# Patient Record
Sex: Female | Born: 1935
Health system: Southern US, Community
[De-identification: ages and names within clinical notes are randomized; demographics above are authoritative.]

## PROBLEM LIST (undated history)

## (undated) DIAGNOSIS — M353 Polymyalgia rheumatica: Secondary | ICD-10-CM

## (undated) DIAGNOSIS — M1990 Unspecified osteoarthritis, unspecified site: Secondary | ICD-10-CM

## (undated) DIAGNOSIS — I499 Cardiac arrhythmia, unspecified: Secondary | ICD-10-CM

## (undated) DIAGNOSIS — I10 Essential (primary) hypertension: Secondary | ICD-10-CM

## (undated) HISTORY — DX: Unspecified osteoarthritis, unspecified site: M19.90

## (undated) HISTORY — PX: SHOULDER ARTHROSCOPY W/ ROTATOR CUFF REPAIR: SHX2400

## (undated) HISTORY — DX: Cardiac arrhythmia, unspecified: I49.9

## (undated) HISTORY — PX: CATARACT EXTRACTION W/ INTRAOCULAR LENS  IMPLANT, BILATERAL: SHX1307

## (undated) HISTORY — DX: Polymyalgia rheumatica: M35.3

## (undated) HISTORY — DX: Essential (primary) hypertension: I10

---

## 1969-08-05 HISTORY — PX: VAGINAL HYSTERECTOMY: SUR661

## 1999-06-10 ENCOUNTER — Other Ambulatory Visit: Admission: RE | Admit: 1999-06-10 | Discharge: 1999-06-10 | Payer: Self-pay | Admitting: Family Medicine

## 2000-07-18 ENCOUNTER — Encounter: Admission: RE | Admit: 2000-07-18 | Discharge: 2000-07-18 | Payer: Self-pay | Admitting: Family Medicine

## 2000-07-18 ENCOUNTER — Encounter: Payer: Self-pay | Admitting: Family Medicine

## 2000-09-19 ENCOUNTER — Other Ambulatory Visit: Admission: RE | Admit: 2000-09-19 | Discharge: 2000-09-19 | Payer: Self-pay | Admitting: Family Medicine

## 2000-09-26 ENCOUNTER — Encounter: Payer: Self-pay | Admitting: Family Medicine

## 2000-09-26 ENCOUNTER — Encounter: Admission: RE | Admit: 2000-09-26 | Discharge: 2000-09-26 | Payer: Self-pay | Admitting: Family Medicine

## 2000-11-10 ENCOUNTER — Encounter: Payer: Self-pay | Admitting: Urology

## 2000-11-10 ENCOUNTER — Encounter: Admission: RE | Admit: 2000-11-10 | Discharge: 2000-11-10 | Payer: Self-pay | Admitting: Obstetrics and Gynecology

## 2001-08-01 ENCOUNTER — Encounter: Payer: Self-pay | Admitting: Family Medicine

## 2001-08-01 ENCOUNTER — Encounter: Admission: RE | Admit: 2001-08-01 | Discharge: 2001-08-01 | Payer: Self-pay | Admitting: Family Medicine

## 2001-10-18 ENCOUNTER — Other Ambulatory Visit: Admission: RE | Admit: 2001-10-18 | Discharge: 2001-10-18 | Payer: Self-pay | Admitting: Family Medicine

## 2002-08-07 ENCOUNTER — Encounter: Admission: RE | Admit: 2002-08-07 | Discharge: 2002-08-07 | Payer: Self-pay | Admitting: Family Medicine

## 2002-08-07 ENCOUNTER — Encounter: Payer: Self-pay | Admitting: Family Medicine

## 2002-10-10 ENCOUNTER — Encounter: Payer: Self-pay | Admitting: Family Medicine

## 2002-10-10 ENCOUNTER — Encounter: Admission: RE | Admit: 2002-10-10 | Discharge: 2002-10-10 | Payer: Self-pay | Admitting: Family Medicine

## 2003-09-10 ENCOUNTER — Encounter: Payer: Self-pay | Admitting: Family Medicine

## 2003-09-10 ENCOUNTER — Encounter: Admission: RE | Admit: 2003-09-10 | Discharge: 2003-09-10 | Payer: Self-pay | Admitting: Family Medicine

## 2004-08-03 ENCOUNTER — Ambulatory Visit (HOSPITAL_COMMUNITY): Admission: RE | Admit: 2004-08-03 | Discharge: 2004-08-03 | Payer: Self-pay | Admitting: Orthopedic Surgery

## 2004-08-03 ENCOUNTER — Ambulatory Visit (HOSPITAL_BASED_OUTPATIENT_CLINIC_OR_DEPARTMENT_OTHER): Admission: RE | Admit: 2004-08-03 | Discharge: 2004-08-03 | Payer: Self-pay | Admitting: Orthopedic Surgery

## 2004-10-19 ENCOUNTER — Encounter: Admission: RE | Admit: 2004-10-19 | Discharge: 2004-10-19 | Payer: Self-pay | Admitting: Family Medicine

## 2005-02-15 ENCOUNTER — Ambulatory Visit: Payer: Self-pay | Admitting: Family Medicine

## 2005-10-25 ENCOUNTER — Ambulatory Visit: Payer: Self-pay | Admitting: Family Medicine

## 2006-01-12 ENCOUNTER — Encounter: Admission: RE | Admit: 2006-01-12 | Discharge: 2006-01-12 | Payer: Self-pay | Admitting: Family Medicine

## 2006-02-22 ENCOUNTER — Ambulatory Visit: Payer: Self-pay | Admitting: Family Medicine

## 2006-03-17 ENCOUNTER — Ambulatory Visit: Payer: Self-pay | Admitting: Family Medicine

## 2006-03-29 ENCOUNTER — Ambulatory Visit: Payer: Self-pay | Admitting: Family Medicine

## 2006-04-06 ENCOUNTER — Encounter: Admission: RE | Admit: 2006-04-06 | Discharge: 2006-04-06 | Payer: Self-pay | Admitting: Family Medicine

## 2006-04-26 ENCOUNTER — Ambulatory Visit: Payer: Self-pay | Admitting: Internal Medicine

## 2006-05-10 ENCOUNTER — Encounter: Payer: Self-pay | Admitting: Internal Medicine

## 2006-05-10 ENCOUNTER — Ambulatory Visit: Payer: Self-pay | Admitting: Internal Medicine

## 2007-02-06 ENCOUNTER — Encounter: Admission: RE | Admit: 2007-02-06 | Discharge: 2007-02-06 | Payer: Self-pay | Admitting: Family Medicine

## 2007-04-24 ENCOUNTER — Ambulatory Visit: Payer: Self-pay | Admitting: Family Medicine

## 2007-04-24 LAB — CONVERTED CEMR LAB
ALT: 18 units/L (ref 0–40)
AST: 25 units/L (ref 0–37)
Albumin: 4.1 g/dL (ref 3.5–5.2)
Alkaline Phosphatase: 46 units/L (ref 39–117)
Basophils Relative: 0.1 % (ref 0.0–1.0)
Bilirubin, Direct: 0.1 mg/dL (ref 0.0–0.3)
CO2: 34 meq/L — ABNORMAL HIGH (ref 19–32)
Chloride: 104 meq/L (ref 96–112)
Creatinine, Ser: 0.8 mg/dL (ref 0.4–1.2)
Eosinophils Relative: 2.3 % (ref 0.0–5.0)
HCT: 43.1 % (ref 36.0–46.0)
Hemoglobin: 15 g/dL (ref 12.0–15.0)
LDL Cholesterol: 123 mg/dL — ABNORMAL HIGH (ref 0–99)
MCV: 98.9 fL (ref 78.0–100.0)
Monocytes Absolute: 0.7 10*3/uL (ref 0.2–0.7)
Monocytes Relative: 11.3 % — ABNORMAL HIGH (ref 3.0–11.0)
Neutrophils Relative %: 63.1 % (ref 43.0–77.0)
Platelets: 196 10*3/uL (ref 150–400)
Potassium: 3.6 meq/L (ref 3.5–5.1)
RBC: 4.36 M/uL (ref 3.87–5.11)
TSH: 0.81 microintl units/mL (ref 0.35–5.50)
VLDL: 20 mg/dL (ref 0–40)
WBC: 6.1 10*3/uL (ref 4.5–10.5)

## 2007-04-26 ENCOUNTER — Ambulatory Visit: Payer: Self-pay | Admitting: Family Medicine

## 2007-10-10 ENCOUNTER — Ambulatory Visit: Payer: Self-pay | Admitting: Family Medicine

## 2008-02-21 ENCOUNTER — Encounter: Admission: RE | Admit: 2008-02-21 | Discharge: 2008-02-21 | Payer: Self-pay | Admitting: Family Medicine

## 2008-03-04 ENCOUNTER — Encounter: Admission: RE | Admit: 2008-03-04 | Discharge: 2008-03-04 | Payer: Self-pay | Admitting: Family Medicine

## 2008-03-12 ENCOUNTER — Ambulatory Visit: Payer: Self-pay | Admitting: Family Medicine

## 2008-03-12 DIAGNOSIS — M1990 Unspecified osteoarthritis, unspecified site: Secondary | ICD-10-CM

## 2008-03-12 DIAGNOSIS — IMO0001 Reserved for inherently not codable concepts without codable children: Secondary | ICD-10-CM

## 2008-03-12 DIAGNOSIS — I10 Essential (primary) hypertension: Secondary | ICD-10-CM | POA: Insufficient documentation

## 2008-03-12 DIAGNOSIS — M810 Age-related osteoporosis without current pathological fracture: Secondary | ICD-10-CM | POA: Insufficient documentation

## 2008-04-09 ENCOUNTER — Ambulatory Visit: Payer: Self-pay | Admitting: Family Medicine

## 2008-04-09 LAB — CONVERTED CEMR LAB
ALT: 17 units/L (ref 0–35)
AST: 22 units/L (ref 0–37)
Alkaline Phosphatase: 64 units/L (ref 39–117)
BUN: 16 mg/dL (ref 6–23)
Basophils Relative: 0.6 % (ref 0.0–1.0)
Bilirubin, Direct: 0.1 mg/dL (ref 0.0–0.3)
CO2: 33 meq/L — ABNORMAL HIGH (ref 19–32)
Calcium: 9.4 mg/dL (ref 8.4–10.5)
Creatinine, Ser: 0.8 mg/dL (ref 0.4–1.2)
Eosinophils Relative: 3 % (ref 0.0–5.0)
GFR calc Af Amer: 91 mL/min
GFR calc non Af Amer: 75 mL/min
Lymphocytes Relative: 18.6 % (ref 12.0–46.0)
Neutrophils Relative %: 61.1 % (ref 43.0–77.0)
Platelets: 237 10*3/uL (ref 150–400)
Potassium: 5 meq/L (ref 3.5–5.1)
RDW: 12.9 % (ref 11.5–14.6)
TSH: 0.71 microintl units/mL (ref 0.35–5.50)
Total Protein: 6.8 g/dL (ref 6.0–8.3)

## 2008-04-16 ENCOUNTER — Telehealth: Payer: Self-pay | Admitting: Family Medicine

## 2008-04-16 ENCOUNTER — Ambulatory Visit: Payer: Self-pay | Admitting: Family Medicine

## 2008-04-22 ENCOUNTER — Telehealth: Payer: Self-pay | Admitting: Family Medicine

## 2008-05-16 ENCOUNTER — Ambulatory Visit: Payer: Self-pay | Admitting: Family Medicine

## 2008-05-16 DIAGNOSIS — M353 Polymyalgia rheumatica: Secondary | ICD-10-CM

## 2008-05-16 DIAGNOSIS — F17200 Nicotine dependence, unspecified, uncomplicated: Secondary | ICD-10-CM

## 2008-05-16 LAB — CONVERTED CEMR LAB
AST: 21 units/L (ref 0–37)
Albumin: 3.8 g/dL (ref 3.5–5.2)
Alkaline Phosphatase: 53 units/L (ref 39–117)
Anti Nuclear Antibody(ANA): NEGATIVE
BUN: 15 mg/dL (ref 6–23)
Basophils Relative: 0.5 % (ref 0.0–1.0)
Bilirubin, Direct: 0.1 mg/dL (ref 0.0–0.3)
Calcium: 9.2 mg/dL (ref 8.4–10.5)
Chloride: 96 meq/L (ref 96–112)
Cholesterol: 167 mg/dL (ref 0–200)
Creatinine, Ser: 0.8 mg/dL (ref 0.4–1.2)
GFR calc Af Amer: 91 mL/min
HDL: 39.3 mg/dL (ref >39.0)
Hemoglobin: 13.8 g/dL (ref 12.0–15.0)
Lymphocytes Relative: 16.9 % (ref 12.0–46.0)
Neutro Abs: 7.1 10*3/uL (ref 1.4–7.7)
Neutrophils Relative %: 71.9 % (ref 43.0–77.0)
Platelets: 278 10*3/uL (ref 150–400)
RBC: 4.11 M/uL (ref 3.87–5.11)
RDW: 13.6 % (ref 11.5–14.6)
Rhuematoid fact SerPl-aCnc: <20 intl units/mL — ABNORMAL LOW (ref 0.0–20.0)
Triglycerides: 90 mg/dL (ref 0–149)

## 2008-06-04 ENCOUNTER — Ambulatory Visit: Payer: Self-pay | Admitting: Family Medicine

## 2008-06-04 LAB — CONVERTED CEMR LAB
BUN: 20 mg/dL (ref 6–23)
Calcium: 9.5 mg/dL (ref 8.4–10.5)
Creatinine, Ser: 0.9 mg/dL (ref 0.4–1.2)
Potassium: 4 meq/L (ref 3.5–5.1)
Sodium: 137 meq/L (ref 135–145)

## 2008-06-05 ENCOUNTER — Telehealth: Payer: Self-pay | Admitting: Family Medicine

## 2008-06-09 ENCOUNTER — Ambulatory Visit: Payer: Self-pay | Admitting: Family Medicine

## 2008-06-09 DIAGNOSIS — E739 Lactose intolerance, unspecified: Secondary | ICD-10-CM

## 2008-06-19 ENCOUNTER — Telehealth: Payer: Self-pay | Admitting: Family Medicine

## 2008-07-14 ENCOUNTER — Telehealth: Payer: Self-pay | Admitting: Family Medicine

## 2008-09-09 ENCOUNTER — Ambulatory Visit: Payer: Self-pay | Admitting: Family Medicine

## 2009-03-16 ENCOUNTER — Encounter: Admission: RE | Admit: 2009-03-16 | Discharge: 2009-03-16 | Payer: Self-pay | Admitting: Family Medicine

## 2009-04-09 ENCOUNTER — Encounter: Payer: Self-pay | Admitting: Family Medicine

## 2009-04-21 ENCOUNTER — Ambulatory Visit: Payer: Self-pay | Admitting: Family Medicine

## 2009-04-21 LAB — CONVERTED CEMR LAB
Bilirubin Urine: NEGATIVE
Urobilinogen, UA: 0.2

## 2009-04-22 LAB — CONVERTED CEMR LAB
AST: 23 units/L (ref 0–37)
Basophils Relative: 0.1 % (ref 0.0–3.0)
CRP, High Sensitivity: 2 (ref 0.00–5.00)
Chloride: 103 meq/L (ref 96–112)
Cholesterol: 192 mg/dL (ref 0–200)
Creatinine, Ser: 0.7 mg/dL (ref 0.4–1.2)
Eosinophils Relative: 3.7 % (ref 0.0–5.0)
HDL: 47.8 mg/dL (ref >39.00)
Hemoglobin: 15.3 g/dL — ABNORMAL HIGH (ref 12.0–15.0)
Lymphocytes Relative: 16 % (ref 12.0–46.0)
Platelets: 190 10*3/uL (ref 150.0–400.0)
Potassium: 3.4 meq/L — ABNORMAL LOW (ref 3.5–5.1)
RBC: 4.3 M/uL (ref 3.87–5.11)
Sodium: 141 meq/L (ref 135–145)
TSH: 1.03 microintl units/mL (ref 0.35–5.50)
Total Bilirubin: 1 mg/dL (ref 0.3–1.2)
Total Protein: 7.1 g/dL (ref 6.0–8.3)
Triglycerides: 154 mg/dL — ABNORMAL HIGH (ref 0.0–149.0)
VLDL: 30.8 mg/dL (ref 0.0–40.0)

## 2009-07-23 ENCOUNTER — Ambulatory Visit: Payer: Self-pay | Admitting: Family Medicine

## 2009-09-23 ENCOUNTER — Ambulatory Visit: Payer: Self-pay | Admitting: Family Medicine

## 2009-11-02 ENCOUNTER — Telehealth: Payer: Self-pay | Admitting: Family Medicine

## 2009-11-03 ENCOUNTER — Ambulatory Visit: Payer: Self-pay | Admitting: Family Medicine

## 2009-11-03 DIAGNOSIS — J45901 Unspecified asthma with (acute) exacerbation: Secondary | ICD-10-CM

## 2010-03-30 ENCOUNTER — Encounter: Admission: RE | Admit: 2010-03-30 | Discharge: 2010-03-30 | Payer: Self-pay | Admitting: Family Medicine

## 2010-03-30 LAB — HM MAMMOGRAPHY

## 2010-05-24 ENCOUNTER — Ambulatory Visit: Payer: Self-pay | Admitting: Family Medicine

## 2010-05-24 LAB — CONVERTED CEMR LAB
AST: 24 units/L (ref 0–37)
BUN: 22 mg/dL (ref 6–23)
Calcium: 10 mg/dL (ref 8.4–10.5)
Chloride: 102 meq/L (ref 96–112)
Eosinophils Absolute: 0.3 10*3/uL (ref 0.0–0.7)
Glucose, Urine, Semiquant: NEGATIVE
HCT: 46.3 % — ABNORMAL HIGH (ref 36.0–46.0)
Hemoglobin: 15.9 g/dL — ABNORMAL HIGH (ref 12.0–15.0)
MCV: 104 fL — ABNORMAL HIGH (ref 78.0–100.0)
Monocytes Relative: 11.4 % (ref 3.0–12.0)
Neutro Abs: 4.5 10*3/uL (ref 1.4–7.7)
Nitrite: NEGATIVE
Platelets: 190 10*3/uL (ref 150.0–400.0)
Potassium: 4.9 meq/L (ref 3.5–5.1)
Protein, U semiquant: 1
RBC: 4.45 M/uL (ref 3.87–5.11)
RDW: 13.3 % (ref 11.5–14.6)
Sodium: 143 meq/L (ref 135–145)
Specific Gravity, Urine: 1.02
TSH: 1.18 microintl units/mL (ref 0.35–5.50)
Total Bilirubin: 1 mg/dL (ref 0.3–1.2)
Urobilinogen, UA: 1

## 2010-06-11 ENCOUNTER — Ambulatory Visit: Payer: Self-pay | Admitting: Family Medicine

## 2010-09-07 ENCOUNTER — Ambulatory Visit: Payer: Self-pay | Admitting: Family Medicine

## 2010-12-26 ENCOUNTER — Encounter: Payer: Self-pay | Admitting: Family Medicine

## 2011-01-04 NOTE — Assessment & Plan Note (Signed)
Summary: cpx//ccm   Vital Signs:  Patient profile:   77 year old female Menstrual status:  hysterectomy Height:      64.5 inches Weight:      121 pounds BMI:     20.52 Temp:     98.5 degrees F oral BP sitting:   140 / 90  (right arm) Cuff size:   regular  Vitals Entered By: Kathrynn Speed CMA (June 11, 2010 9:48 AM) CC: CPXwith lab review/src   CC:  CPXwith lab review/src.  History of Present Illness: Shirley [name] is a 77 year old, married female, smoker........Marland Kitchen 10 cigarettes a day...Marland Kitchen. two.  Again declines to try a smoking cessation program.  He comes in today for evaluation of hypertension.  Her blood pressures treated with Tenoretic 50 -- 25 daily 2...........40 mEq  potassium tablets daily.BP 120/80.  >MWV  Preventive Screening-Counseling & Management  Alcohol-Tobacco     Smoking Cessation Counseling: yes  Current Medications (verified): 1)  Potassium Chloride Crys Cr 20 Meq Tbcr (Potassium Chloride Crys Cr) .... 2 Daily 2)  Tenoretic 50 50-25 Mg Tabs (Atenolol-Chlorthalidone) .Marland Kitchen.. 1 Tablet By Mouth Once A Day  Allergies (verified): No Known Drug Allergies   Complete Medication List: 1)  Potassium Chloride Crys Cr 20 Meq Tbcr (Potassium chloride crys cr) .... 2 daily 2)  Tenoretic 50 50-25 Mg Tabs (Atenolol-chlorthalidone) .Marland Kitchen.. 1 tablet by mouth once a day  Other Orders: EKG w/ Interpretation (93000) Tobacco use cessation intermediate 3-10 minutes (95621) First annual wellness visit with prevention plan  (H0865)  Patient Instructions: 1)  Please schedule a follow-up appointment in 1 year. 2)  Tobacco is very bad for your health and your loved ones! You Should stop smoking!. 3)  Stop Smoking Tips: Choose a Quit date. Cut down before the Quit date. decide what you will do as a substitute when you feel the urge to smoke(gum,toothpick,exercise). 4)  It is important that you exercise regularly at least 20 minutes 5 times a week. If you develop chest pain, have severe  difficulty breathing, or feel very tired , stop exercising immediately and seek medical attention. 5)  Schedule your mammogram. 6)  Schedule a colonoscopy/sigmoidoscopy to help detect colon cancer. 7)  Take calcium +Vitamin D daily. 8)  Take an Aspirin every day.

## 2011-01-04 NOTE — Assessment & Plan Note (Signed)
Summary: flu shot/njr   Nurse Visit   Allergies: No Known Drug Allergies  Orders Added: 1)  Flu Vaccine 62yrs + MEDICARE PATIENTS [Q2039] 2)  Administration Flu vaccine - MCR [G0008] Flu Vaccine Consent Questions     Do you have a history of severe allergic reactions to this vaccine? no    Any prior history of allergic reactions to egg and/or gelatin? no    Do you have a sensitivity to the preservative Thimersol? no    Do you have a past history of Guillan-Barre Syndrome? no    Do you currently have an acute febrile illness? no    Have you ever had a severe reaction to latex? no    Vaccine information given and explained to patient? yes    Are you currently pregnant? no    Lot Number:[id number]   Exp Date:06/04/2011   Site Given  Left Deltoid IM.lbmedflu

## 2011-03-22 ENCOUNTER — Other Ambulatory Visit: Payer: Self-pay | Admitting: Family Medicine

## 2011-03-22 DIAGNOSIS — Z1231 Encounter for screening mammogram for malignant neoplasm of breast: Secondary | ICD-10-CM

## 2011-04-11 ENCOUNTER — Ambulatory Visit
Admission: RE | Admit: 2011-04-11 | Discharge: 2011-04-11 | Disposition: A | Discharge status: Home / Self Care | Payer: MEDICARE | Source: Ambulatory Visit | Attending: Family Medicine | Admitting: Family Medicine

## 2011-04-11 DIAGNOSIS — Z1231 Encounter for screening mammogram for malignant neoplasm of breast: Secondary | ICD-10-CM

## 2011-04-22 NOTE — Op Note (Signed)
NAME:  Shirley [name], Shirley [name]                         ACCOUNT NO.:  [id number]   MEDICAL RECORD NO.:  [id number]                   PATIENT TYPE:  AMB   LOCATION:  DSC                                  FACILITY:  [facility]   PHYSICIAN:  Katy Fitch. Naaman Plummer., M.D.          DATE OF BIRTH:  01/28/1934   DATE OF PROCEDURE:  08/03/2004  DATE OF DISCHARGE:                                 OPERATIVE REPORT   PREOPERATIVE DIAGNOSIS:  Chronic mucous cyst, right long finger, with  repetitive episodes of drainage.   POSTOPERATIVE DIAGNOSIS:  Chronic mucous cyst, right long finger, with  repetitive episodes of drainage.   OPERATION:  Excision of right long finger mucous cyst and DIP joint  debridement with marginal osteophyte resection from distant phalanx, dorsal  ulnar aspect, right long finger DIP joint, and irrigation of DIP joint.   OPERATION PERFORMED BY:  Katy Fitch. Sypher, M.D.   ASSISTANT:  Marveen Reeks. Dasnoit, P.A.-C.   ANESTHESIA:  0.25% Marcaine and 2% lidocaine metacarpal head level block  right long finger supplemented by IV sedation, supervised by  anesthesiologist, Dr. Krista Blue.   INDICATIONS:  Shirley [name] is a 77 year old woman referred for evaluation  and management of a painful draining right long finger mucous cyst. She had  a history of multiple episodes of spontaneous drainage and may have had an  episode of prior septic arthritis.   We recommended irrigation and debridement of her DIP joint and cyst  excision. After informed consent, she was brought to the operating room at  this time.   PROCEDURE:  [name] is brought to the operating room and placed in  supine position on the operating room table. Following light sedation, the  right arm was prepped with Betadine solution and sterilely draped.   Following placement 0.25% Marcaine and 2% lidocaine metacarpal head level  block, anesthesia was satisfactory in the right long finger.   Finger was exsanguinated with a gauze  wrap and a 1/2-inch Penrose drain was  placed at the P1 segment as a digital tourniquet.   Procedure commenced with a transverse incision directly over the DIP joint.  Subcutaneous tissues were carefully divided revealing a capsule of the DIP  joint that was exposed between the central extensor slip and the radial and  ulnar collateral ligaments. Triangular portions of the capsule were excised  to expose the insertion and deep surface of the extensor tendon at the  distal phalanx.   A small osteophyte was noted on the dorsal ulnar aspect of the joint. This  was removed with a micro curet. The joint was thoroughly irrigated.   The mucous cyst was then circumferentially dissected, drained of its  mucinous contents, and cleaned with a micro curet and fine rongeur.   No other loose bodies or other predicaments were identified.   The wound was then repaired with interrupted sutures of 5-0 nylon.   Compressive dressing was applied with Xeroform,  sterile gauze, and Coban.   For aftercare, Shirley [name] was advised to elevate her hand for one week. She  given prescriptions for Keflex 500 mg 1 p.o. q.8h. for 4 days as a  prophylactic antibiotic to prevent septic arthritis and Vicodin 1 tablet  p.o. q.4-6h. p.r.n. pain, 20 tablets without refill as an analgesic.                                               Katy Fitch Naaman Plummer., M.D.    RVS/MEDQ  D:  08/03/2004  T:  08/03/2004  Job:  [id number]

## 2011-04-29 ENCOUNTER — Other Ambulatory Visit: Payer: Self-pay | Admitting: Family Medicine

## 2011-05-24 ENCOUNTER — Encounter: Payer: Self-pay | Admitting: Internal Medicine

## 2011-06-05 HISTORY — PX: TOTAL SHOULDER REPLACEMENT: SUR1217

## 2011-06-16 ENCOUNTER — Encounter (HOSPITAL_COMMUNITY)
Admission: RE | Admit: 2011-06-16 | Discharge: 2011-06-16 | Disposition: A | Discharge status: Home / Self Care | Payer: Medicare Other | Source: Ambulatory Visit | Attending: Orthopedic Surgery | Admitting: Orthopedic Surgery

## 2011-06-16 ENCOUNTER — Other Ambulatory Visit (HOSPITAL_COMMUNITY): Payer: Self-pay | Admitting: Orthopedic Surgery

## 2011-06-16 DIAGNOSIS — Z8739 Personal history of other diseases of the musculoskeletal system and connective tissue: Secondary | ICD-10-CM

## 2011-06-16 LAB — CBC
MCV: 97.2 fL (ref 78.0–100.0)
Platelets: 213 10*3/uL (ref 150–400)
RBC: 4.65 MIL/uL (ref 3.87–5.11)
RDW: 13.3 % (ref 11.5–15.5)
WBC: 7.7 10*3/uL (ref 4.0–10.5)

## 2011-06-16 LAB — SURGICAL PCR SCREEN: MRSA, PCR: NEGATIVE

## 2011-06-16 LAB — APTT: aPTT: 29 seconds (ref 24–37)

## 2011-06-16 LAB — URINALYSIS, ROUTINE W REFLEX MICROSCOPIC
Hgb urine dipstick: NEGATIVE
Leukocytes, UA: NEGATIVE
Nitrite: NEGATIVE
Protein, ur: NEGATIVE mg/dL
pH: 6.5 (ref 5.0–8.0)

## 2011-06-16 LAB — PROTIME-INR
INR: 0.97 (ref 0.00–1.49)
Prothrombin Time: 13.1 seconds (ref 11.6–15.2)

## 2011-06-16 LAB — COMPREHENSIVE METABOLIC PANEL
Albumin: 4.1 g/dL (ref 3.5–5.2)
CO2: 34 mEq/L — ABNORMAL HIGH (ref 19–32)
Chloride: 98 mEq/L (ref 96–112)
GFR calc Af Amer: >60 mL/min (ref >60)
Glucose, Bld: 89 mg/dL (ref 70–99)

## 2011-06-22 ENCOUNTER — Other Ambulatory Visit: Payer: Medicare Other

## 2011-06-23 ENCOUNTER — Inpatient Hospital Stay (HOSPITAL_COMMUNITY)
Admission: RE | Admit: 2011-06-23 | Discharge: 2011-06-24 | DRG: 484 | Disposition: A | Discharge status: Home / Self Care | Payer: Medicare Other | Source: Ambulatory Visit | Attending: Orthopedic Surgery | Admitting: Orthopedic Surgery

## 2011-06-23 DIAGNOSIS — I10 Essential (primary) hypertension: Secondary | ICD-10-CM | POA: Diagnosis present

## 2011-06-23 DIAGNOSIS — Z01818 Encounter for other preprocedural examination: Secondary | ICD-10-CM

## 2011-06-23 DIAGNOSIS — M75120 Complete rotator cuff tear or rupture of unspecified shoulder, not specified as traumatic: Secondary | ICD-10-CM | POA: Diagnosis present

## 2011-06-23 DIAGNOSIS — M12819 Other specific arthropathies, not elsewhere classified, unspecified shoulder: Principal | ICD-10-CM | POA: Diagnosis present

## 2011-06-23 DIAGNOSIS — M353 Polymyalgia rheumatica: Secondary | ICD-10-CM | POA: Diagnosis present

## 2011-06-29 ENCOUNTER — Encounter: Payer: Medicare Other | Admitting: Family Medicine

## 2011-07-14 NOTE — Op Note (Signed)
NAMEDESIRIE, MINTEER NO.:  [id number]  MEDICAL RECORD NO.:  [id number]  LOCATION:  5028                         FACILITY:  [facility]  PHYSICIAN:  Vania Rea. Kemoni Ortega, M.D.  DATE OF BIRTH:  June 02, 1934  DATE OF PROCEDURE:  06/23/2011 DATE OF DISCHARGE:                              OPERATIVE REPORT   PREOPERATIVE DIAGNOSIS:  End-stage right shoulder rotator cuff tear arthropathy.  POSTOPERATIVE DIAGNOSIS:  End-stage right shoulder rotator cuff tear arthropathy.  PROCEDURE:  Right reverse shoulder arthroplasty utilizing a cemented DePuy stem size 10, +3 poly, and a 38 eccentric glenosphere.  SURGEON:  Vania Rea. Nikolette Reindl, M.D.  Threasa HeadsFrench Ana A. Shuford, P.A.-C.  ANESTHESIA:  General endotracheal as well as an interscalene block.  ESTIMATED BLOOD LOSS:  250 mL.  DRAINS:  None.  HISTORY:  Shirley [name] is a 77 year old female who has had a previous right shoulder rotator cuff repair for a massive rotator cuff tear, has gone on to a recurrent tear and progression to end-stage rotator cuff tear arthropathy.  Due to her ongoing and increasing pain as well as functional limitation, she was brought to the operating room at this time for planned right shoulder reverse arthroplasty.  Preoperatively, counseled Shirley [name] on treatment options as well as risks versus benefits thereof.  Possible surgical complications were reviewed including potential for bleeding, infection, neurovascular injury, persistent pain, loss of motion, failure of the implant, anesthetic complication, and possible need for additional surgery.  She understands and accepts, and agrees with the planned procedure.  PROCEDURE IN DETAIL:  After undergoing routine preop evaluation, the patient received prophylactic antibiotics and an interscalene block was established in the holding area by the Anesthesia Department.  Placed supine on the operating room table, underwent smooth induction of general  endotracheal anesthesia.  Placed in beach-chair position and appropriately padded and protected.  The right shoulder region was then sterilely prepped and draped in standard fashion.  Time-out was called. Anterior approach of the right shoulder was made through deltopectoral interval incision of approximately 15 cm in length.  Skin flaps were elevated.  Electrocautery was used for hemostasis.  Deltopectoral interval was identified with cephalic vein retracted lateral to the deltoid and this interval was developed and the upper centimeter of pec major was tenotomized.  Adhesions throughout the subacromial/subdeltoid bursal region were divided and then the  remnant of the rotator cuff superiorly was excised.  The anterior cuff tissues were also divided and the humeral head was then delivered through the wound and the capsular tissues in the inferior aspect of the humeral head were divided to allow complete exposure of the humeral head.  It was markedly eburnated both medially and superiorly.  A rongeur was then used to resect the eburnated bone at the apex of the humeral head and through this we have passed a series of hand reamers up to size 10 into the humeral canal. The intramedullary guide was then used and had a 0 degree retroversion. We performed our initial humeral head cut utilizing the appropriate guide and oscillating saw.  Once this was completed, a metal cap was placed across the cut surface of the proximal humerus and we then  used retractors to expose the glenoid and performed a circumferential labral resection as well as capsulotomy such that the entire circumference of the glenoid could be visualized.  The glenoid guide was placed and a guide pin was then directed into the center of the glenoid.  We then reamed glenoid to a smooth subchondral bone surface and used the peripheral reamer to clean any remaining of bony debris.  The central peg hole was then drilled with  appropriate reamer.  This was then copiously irrigated and we impacted the glenoid baseplate and then used the drill to place the four peripheral screws.  Locking screws placed into the inferior, superior, and posterior holes, and a nonlocking screw anteriorly and all these obtained excellent bony purchase.  We then placed the 38 eccentric glenosphere over the guidewire into the baseplate, it was then screwed into position, tamped, and retightened with excellent fit and fixation.  At this point, we then returned our attention to the humerus where the trial stem was then impacted to appropriate depth and of this we sized the humerus of a size 1.  We then placed the metaphyseal reamer.  The humeral guide was then removed and the trial implant was placed.  The excellent fixation was achieved and a +3 poly showed excellent soft tissue balance and fit.  After the trial reduction, the implant was removed.  We irrigated the canal.  A distal cement plug was placed.  The canal was then irrigated, dried, and cement was then mixed in the appropriate consistency, we introduced cement into the humeral canal.  The final stem was then impacted into position at 0 degrees of retroversion and all extra cement was meticulously removed. After the cemented hardware, we again performed trial reduction with +3 which showed excellent soft tissue balance and fit.  The final +3 poly was then impacted into position.  Final reduction was performed. Excellent shoulder motion and stability was achieved.  The wound was copiously irrigated.  Hemostasis was obtained.  The deltopectoral interval was then reapproximated with 0 Vicryl sutures, 2-0 Vicryl used for the subcu layer and intracuticular, and 3-0 Monocryl for the skin followed by Steri-Strips.  Dry dressing was applied.  Right arm was placed in a sling.  The patient was then awakened, extubated, and taken to the recovery room in stable condition.     Vania Rea.  Ryker Sudbury, M.D.     KMS/MEDQ  D:  06/23/2011  T:  06/23/2011  Job:  [id number]  Electronically Signed by Francena Hanly M.D. on 07/14/2011 01:30:28 PM

## 2011-07-25 ENCOUNTER — Other Ambulatory Visit (INDEPENDENT_AMBULATORY_CARE_PROVIDER_SITE_OTHER): Payer: Medicare Other

## 2011-07-25 DIAGNOSIS — M353 Polymyalgia rheumatica: Secondary | ICD-10-CM

## 2011-07-25 DIAGNOSIS — IMO0001 Reserved for inherently not codable concepts without codable children: Secondary | ICD-10-CM

## 2011-07-25 DIAGNOSIS — Z79899 Other long term (current) drug therapy: Secondary | ICD-10-CM

## 2011-07-25 DIAGNOSIS — Z0000 Encounter for general adult medical examination without abnormal findings: Secondary | ICD-10-CM

## 2011-07-25 DIAGNOSIS — F17200 Nicotine dependence, unspecified, uncomplicated: Secondary | ICD-10-CM

## 2011-07-25 DIAGNOSIS — I10 Essential (primary) hypertension: Secondary | ICD-10-CM

## 2011-07-25 LAB — HEPATIC FUNCTION PANEL
ALT: 12 U/L (ref 0–35)
AST: 21 U/L (ref 0–37)
Albumin: 4.3 g/dL (ref 3.5–5.2)
Total Bilirubin: 0.6 mg/dL (ref 0.3–1.2)
Total Protein: 7.2 g/dL (ref 6.0–8.3)

## 2011-07-25 LAB — TSH: TSH: 0.97 u[IU]/mL (ref 0.35–5.50)

## 2011-07-25 LAB — BASIC METABOLIC PANEL
BUN: 20 mg/dL (ref 6–23)
Chloride: 100 mEq/L (ref 96–112)
GFR: 69.23 mL/min (ref >60.00)
Glucose, Bld: 101 mg/dL — ABNORMAL HIGH (ref 70–99)
Potassium: 4.1 mEq/L (ref 3.5–5.1)
Sodium: 141 mEq/L (ref 135–145)

## 2011-07-25 LAB — CBC WITH DIFFERENTIAL/PLATELET
Eosinophils Relative: 8 % — ABNORMAL HIGH (ref 0.0–5.0)
HCT: 44.8 % (ref 36.0–46.0)
Hemoglobin: 14.9 g/dL (ref 12.0–15.0)
Lymphs Abs: 1.6 10*3/uL (ref 0.7–4.0)
MCV: 102.2 fl — ABNORMAL HIGH (ref 78.0–100.0)
Monocytes Absolute: 0.7 10*3/uL (ref 0.1–1.0)
Monocytes Relative: 11.6 % (ref 3.0–12.0)
Neutro Abs: 3 10*3/uL (ref 1.4–7.7)
Platelets: 171 10*3/uL (ref 150.0–400.0)
WBC: 5.8 10*3/uL (ref 4.5–10.5)

## 2011-07-25 LAB — POCT URINALYSIS DIPSTICK
Bilirubin, UA: NEGATIVE
Glucose, UA: NEGATIVE
Ketones, UA: NEGATIVE
Spec Grav, UA: 1.025
Urobilinogen, UA: 0.2

## 2011-07-25 LAB — LIPID PANEL
Cholesterol: 173 mg/dL (ref 0–200)
Triglycerides: 104 mg/dL (ref 0.0–149.0)

## 2011-08-01 ENCOUNTER — Ambulatory Visit (INDEPENDENT_AMBULATORY_CARE_PROVIDER_SITE_OTHER): Payer: Medicare Other | Admitting: Family Medicine

## 2011-08-01 ENCOUNTER — Encounter: Payer: Self-pay | Admitting: Family Medicine

## 2011-08-01 DIAGNOSIS — F17200 Nicotine dependence, unspecified, uncomplicated: Secondary | ICD-10-CM

## 2011-08-01 DIAGNOSIS — I10 Essential (primary) hypertension: Secondary | ICD-10-CM

## 2011-08-01 DIAGNOSIS — IMO0001 Reserved for inherently not codable concepts without codable children: Secondary | ICD-10-CM

## 2011-08-01 DIAGNOSIS — M1990 Unspecified osteoarthritis, unspecified site: Secondary | ICD-10-CM

## 2011-08-01 DIAGNOSIS — M810 Age-related osteoporosis without current pathological fracture: Secondary | ICD-10-CM

## 2011-08-01 DIAGNOSIS — M353 Polymyalgia rheumatica: Secondary | ICD-10-CM

## 2011-08-01 MED ORDER — ATENOLOL-CHLORTHALIDONE 50-25 MG PO TABS: 0.5000 | ORAL_TABLET | Freq: Every day | ORAL | Status: DC

## 2011-08-01 MED ORDER — POTASSIUM CHLORIDE 20 MEQ PO PACK: PACK | ORAL | Status: DC

## 2011-08-01 NOTE — Progress Notes (Signed)
  Subjective:    Patient ID: Shirley [name], female    DOB: Sep 23, 1934, 77 y.o.   MRN: [id number]  Shirley [name] is a 77 year old, married female, smoker 5 cigarettes a day, now who comes in today for evaluation a Medicare wellness examination.  She has a history of hypertension, for which she takes Tenoretic 50 -- 25 daily.  BP 124/80.  Willcutt dose to half a tablet a day.  She also has a history of polymyalgia rheumatica and has been symptom free for many years and now is beginning to ache all over.  She also has lumbar back pain for about two months.  It's worse in the morning, when she gets up and moves around it goes away.  She does have a history of osteoporosis and continues to smoke.  She does not take calcium or vitamin D supplements.  She has routine eye care, hearing normal, regular dental care, BSE monthly, and your mammography, colonoscopy due this year.  She put it off because she had right shoulder surgery.  Cognitive function, normal.  Activities of daily living, normal, home health safety reviewed.  No issues identified, no guns in the house, she does have a healthcare power of attorney, and a living will.    Review of Systems  Constitutional: Negative.   HENT: Negative.   Eyes: Negative.   Respiratory: Negative.   Cardiovascular: Negative.   Gastrointestinal: Negative.   Genitourinary: Negative.   Musculoskeletal: Positive for myalgias, back pain and arthralgias.  Neurological: Negative.   Hematological: Negative.   Psychiatric/Behavioral: Negative.        Objective:   Physical Exam  Constitutional: She appears well-developed and well-nourished.  HENT:  Head: Normocephalic and atraumatic.  Right Ear: External ear normal.  Left Ear: External ear normal.  Nose: Nose normal.  Mouth/Throat: Oropharynx is clear and moist.  Eyes: EOM are normal. Pupils are equal, round, and reactive to light.  Neck: Normal range of motion. Neck supple. No thyromegaly present.    Cardiovascular: Normal rate, regular rhythm, normal heart sounds and intact distal pulses.  Exam reveals no gallop and no friction rub.   No murmur heard. Pulmonary/Chest: Effort normal and breath sounds normal.  Abdominal: Soft. Bowel sounds are normal. She exhibits no distension and no mass. There is no tenderness. There is no rebound.  Genitourinary:       Bilateral breast exam normal  Musculoskeletal: Normal range of motion.       Accentuated lumbar  Lymphadenopathy:    She has no cervical adenopathy.  Neurological: She is alert. She has normal reflexes. No cranial nerve deficit. She exhibits normal muscle tone. Coordination normal.  Skin: Skin is warm and dry.  Psychiatric: She has a normal mood and affect. Her behavior is normal. Judgment and thought content normal.          Assessment & Plan:  Healthy female.  Hypertension decrease Tenoretic to one half tab daily.  Osteoarthritis.  Motrin, 600 b.i.d., exercise program.  Osteoporosis.  Check bone density.  PMR.  Restart prednisone.

## 2011-08-01 NOTE — Patient Instructions (Signed)
Decrease the Tenoretic to one half tablet daily.  Restart the prednisone by taking 40 mg daily for 5 days, 30 mg daily for 5 days, 20 mg daily for 5 days, then 10 mg daily for 5 days.  Return in 3 weeks for follow-up.  Take a calcium, vitamin D supplement tablet daily.  Walk daily.  Stop smoking completely

## 2011-08-22 ENCOUNTER — Encounter: Payer: Self-pay | Admitting: Family Medicine

## 2011-08-22 ENCOUNTER — Ambulatory Visit (INDEPENDENT_AMBULATORY_CARE_PROVIDER_SITE_OTHER): Payer: Medicare Other | Admitting: Family Medicine

## 2011-08-22 DIAGNOSIS — F17200 Nicotine dependence, unspecified, uncomplicated: Secondary | ICD-10-CM

## 2011-08-22 DIAGNOSIS — I10 Essential (primary) hypertension: Secondary | ICD-10-CM

## 2011-08-22 DIAGNOSIS — Z23 Encounter for immunization: Secondary | ICD-10-CM

## 2011-08-22 DIAGNOSIS — M51369 Other intervertebral disc degeneration, lumbar region without mention of lumbar back pain or lower extremity pain: Secondary | ICD-10-CM | POA: Insufficient documentation

## 2011-08-22 DIAGNOSIS — M353 Polymyalgia rheumatica: Secondary | ICD-10-CM

## 2011-08-22 DIAGNOSIS — M5136 Other intervertebral disc degeneration, lumbar region: Secondary | ICD-10-CM | POA: Insufficient documentation

## 2011-08-22 DIAGNOSIS — M5137 Other intervertebral disc degeneration, lumbosacral region: Secondary | ICD-10-CM

## 2011-08-22 MED ORDER — PREDNISONE 1 MG PO TABS: ORAL_TABLET | ORAL | Status: DC

## 2011-08-22 NOTE — Progress Notes (Signed)
Addended by: Kern Reap B on: 08/22/2011 12:26 PM   Modules accepted: Orders

## 2011-08-22 NOTE — Patient Instructions (Signed)
Tapered the prednisone by 1 mg every month as outlined by a rheumatologist.  Strongly consider a smoking cessation program along with physical therapy for your low back pain.  Continue the current medications for your blood pressure.  Check your blood pressure 3 times weekly at home with a new machine.  returned when you r due f your exam sooner if any problems

## 2011-08-22 NOTE — Progress Notes (Signed)
  Subjective:    Patient ID: Shirley [name], female    DOB: 01-Jan-1934, 77 y.o.   MRN: [id number]  Shirley [name] is a 77 year old, married female, smoker.......... 5 cigarettes a day............ We discussed smoking cessation, and different techniques, including chantix.......... Patient declines.  Her blood pressure on Tenoretic 50 -- 25 one half tab daily is 130/90.  She also takes potassium supplement daily.  She had surgery on her right shoulder and is doing well.  She continues to smoke, but declines to try to quit.  She has a history of PMR, which was diagnosed by Dr. Dareen Piano.  She is currently on 10 mg of prednisone daily was advised to taper by 1 mg every month.  She has right and left low lumbar back pain.  It, sharp, it's worse in the morning.  It radiates down her hips.  Neurologic review of systems otherwise negative.  No history of trauma    Review of Systems General cardiovascular, musculoskeletal, neurologic, review of systems otherwise negative    Objective:   Physical Exam  A well-developed, thin, female, in no acute distress.  BP 130/90.  Examination of back shows the spine to be straight.  No scoliosis.  There is no palpable tenderness.  Neurologic exam normal      Assessment & Plan:  Hypertension continue current medications.  Tobacco abuse advised to stop smoking and outlined a smoking cessation program.  However, patient declines to try  PMR, taper prednisone as directed by rheumatologist.  Lumbar disk disease.  Advise stop smoking and physical therapy both of which she declined

## 2011-09-05 ENCOUNTER — Telehealth: Payer: Self-pay | Admitting: *Deleted

## 2011-09-05 DIAGNOSIS — M549 Dorsalgia, unspecified: Secondary | ICD-10-CM

## 2011-09-05 NOTE — Telephone Encounter (Signed)
Pt is asking for a referral to Dr. Naaman Plummer (orthopedist).  Feels she needs to see an Orthopedist and get a MRI before she starts PT.  This is regarding her back pain with radiation to legs.

## 2011-09-05 NOTE — Telephone Encounter (Signed)
She is welcome  to call and see anybody.  She would like to see his second opinion........she does not need a referral to see an orthopedist

## 2011-09-07 ENCOUNTER — Telehealth: Payer: Self-pay | Admitting: *Deleted

## 2011-09-07 DIAGNOSIS — M810 Age-related osteoporosis without current pathological fracture: Secondary | ICD-10-CM

## 2011-09-07 DIAGNOSIS — M5136 Other intervertebral disc degeneration, lumbar region: Secondary | ICD-10-CM

## 2011-09-07 NOTE — Telephone Encounter (Signed)
Pt is calling to speak to [place].  Asking for a return call.

## 2012-02-08 ENCOUNTER — Telehealth: Payer: Self-pay | Admitting: Family Medicine

## 2012-02-08 NOTE — Telephone Encounter (Signed)
Pts son called and said that his mother is showing signs of memory loss and pts son would like to discuss this with Dr Tawanna Cooler. Pls call.

## 2012-02-09 NOTE — Telephone Encounter (Signed)
Fleet Contras please call,,,,,,,,,, we will need to sit down with the patient and her family to discuss this

## 2012-02-10 NOTE — Telephone Encounter (Signed)
Spoke with son.

## 2012-03-01 ENCOUNTER — Other Ambulatory Visit: Payer: Self-pay | Admitting: Neurosurgery

## 2012-03-13 ENCOUNTER — Encounter (HOSPITAL_COMMUNITY): Payer: Self-pay | Admitting: Pharmacy Technician

## 2012-03-21 ENCOUNTER — Encounter (HOSPITAL_COMMUNITY)
Admission: RE | Admit: 2012-03-21 | Discharge: 2012-03-21 | Disposition: A | Discharge status: Home / Self Care | Payer: Medicare Other | Source: Ambulatory Visit | Attending: Neurosurgery | Admitting: Neurosurgery

## 2012-03-21 ENCOUNTER — Encounter (HOSPITAL_COMMUNITY): Payer: Self-pay

## 2012-03-21 LAB — CBC
HCT: 47.5 % — ABNORMAL HIGH (ref 36.0–46.0)
Hemoglobin: 16.1 g/dL — ABNORMAL HIGH (ref 12.0–15.0)
MCHC: 33.9 g/dL (ref 30.0–36.0)
RBC: 4.8 MIL/uL (ref 3.87–5.11)
WBC: 9.5 10*3/uL (ref 4.0–10.5)

## 2012-03-21 LAB — TYPE AND SCREEN
ABO/RH(D): O POS
Antibody Screen: NEGATIVE

## 2012-03-21 LAB — COMPREHENSIVE METABOLIC PANEL
AST: 24 U/L (ref 0–37)
BUN: 16 mg/dL (ref 6–23)
CO2: 30 mEq/L (ref 19–32)
Calcium: 9.5 mg/dL (ref 8.4–10.5)
Chloride: 100 mEq/L (ref 96–112)
Creatinine, Ser: 0.69 mg/dL (ref 0.50–1.10)
GFR calc Af Amer: >90 mL/min (ref >90)
GFR calc non Af Amer: 82 mL/min — ABNORMAL LOW (ref >90)
Glucose, Bld: 102 mg/dL — ABNORMAL HIGH (ref 70–99)
Total Bilirubin: 0.5 mg/dL (ref 0.3–1.2)

## 2012-03-21 LAB — SURGICAL PCR SCREEN: Staphylococcus aureus: NEGATIVE

## 2012-03-21 NOTE — Progress Notes (Signed)
PATIENT SEES DR JEFF TODD WITH [place].

## 2012-03-21 NOTE — Pre-Procedure Instructions (Signed)
20 Shirley [name]  03/21/2012   Your procedure is scheduled on: Wednesday  03/28/12    Report to [facility] at 815 AM.  Call this number if you have problems the morning of surgery: [phone]   Remember:   Do not eat food:After Midnight.  May have clear liquids: up to 4 Hours before arrival (UNTIL 415 AM )  Clear liquids include soda, tea, black coffee, apple or grape juice, broth.  Take these medicines the morning of surgery with A SIP OF WATER: TENORETIC(ATENOLOL-CHLOR), KLOR-CON  (STOP ASPIRIN, COUMADIN, PLAVIX, EFFIENT, HERBAL MEDS)   Do not wear jewelry, make-up or nail polish.  Do not wear lotions, powders, or perfumes. You may wear deodorant.  Do not shave 48 hours prior to surgery.  Do not bring valuables to the hospital.  Contacts, dentures or bridgework may not be worn into surgery.  Leave suitcase in the car. After surgery it may be brought to your room.  For patients admitted to the hospital, checkout time is 11:00 AM the day of discharge.   Patients discharged the day of surgery will not be allowed to drive home.  Name and phone number of your driver:  Special Instructions: CHG Shower Use Special Wash: 1/2 bottle night before surgery and 1/2 bottle morning of surgery.   Please read over the following fact sheets that you were given: Pain Booklet, MRSA Information and Surgical Site Infection Prevention

## 2012-03-27 MED ORDER — CEFAZOLIN SODIUM 1-5 GM-% IV SOLN
1.0000 g | INTRAVENOUS | Status: AC
  Administered 2012-03-28: 1000 mg via INTRAVENOUS
  Filled 2012-03-27: qty 50

## 2012-03-28 ENCOUNTER — Encounter (HOSPITAL_COMMUNITY)
Admission: RE | Disposition: A | Discharge status: Home / Self Care | Payer: Self-pay | Source: Ambulatory Visit | Attending: Neurosurgery

## 2012-03-28 ENCOUNTER — Inpatient Hospital Stay (HOSPITAL_COMMUNITY): Payer: Medicare Other

## 2012-03-28 ENCOUNTER — Encounter (HOSPITAL_COMMUNITY): Payer: Self-pay | Admitting: Anesthesiology

## 2012-03-28 ENCOUNTER — Encounter (HOSPITAL_COMMUNITY): Payer: Self-pay | Admitting: General Practice

## 2012-03-28 ENCOUNTER — Inpatient Hospital Stay (HOSPITAL_COMMUNITY): Payer: Medicare Other | Admitting: Anesthesiology

## 2012-03-28 ENCOUNTER — Inpatient Hospital Stay (HOSPITAL_COMMUNITY)
Admission: RE | Admit: 2012-03-28 | Discharge: 2012-03-31 | DRG: 460 | Disposition: A | Discharge status: Home / Self Care | Payer: Medicare Other | Source: Ambulatory Visit | Attending: Neurosurgery | Admitting: Neurosurgery

## 2012-03-28 DIAGNOSIS — I10 Essential (primary) hypertension: Secondary | ICD-10-CM

## 2012-03-28 DIAGNOSIS — Z01812 Encounter for preprocedural laboratory examination: Secondary | ICD-10-CM

## 2012-03-28 DIAGNOSIS — M5137 Other intervertebral disc degeneration, lumbosacral region: Secondary | ICD-10-CM | POA: Diagnosis present

## 2012-03-28 DIAGNOSIS — M4310 Spondylolisthesis, site unspecified: Principal | ICD-10-CM | POA: Diagnosis present

## 2012-03-28 DIAGNOSIS — M810 Age-related osteoporosis without current pathological fracture: Secondary | ICD-10-CM | POA: Diagnosis present

## 2012-03-28 DIAGNOSIS — Z01818 Encounter for other preprocedural examination: Secondary | ICD-10-CM

## 2012-03-28 DIAGNOSIS — M51379 Other intervertebral disc degeneration, lumbosacral region without mention of lumbar back pain or lower extremity pain: Secondary | ICD-10-CM | POA: Diagnosis present

## 2012-03-28 DIAGNOSIS — Z87891 Personal history of nicotine dependence: Secondary | ICD-10-CM

## 2012-03-28 HISTORY — PX: POSTERIOR FUSION LUMBAR SPINE: SUR632

## 2012-03-28 HISTORY — DX: Unspecified osteoarthritis, unspecified site: M19.90

## 2012-03-28 SURGERY — POSTERIOR LUMBAR FUSION 1 LEVEL
Anesthesia: General

## 2012-03-28 MED ORDER — FENTANYL CITRATE 0.05 MG/ML IJ SOLN
INTRAMUSCULAR | Status: DC | PRN
  Administered 2012-03-28 (×3): 50 ug via INTRAVENOUS
  Administered 2012-03-28: 150 ug via INTRAVENOUS

## 2012-03-28 MED ORDER — NEOSTIGMINE METHYLSULFATE 1 MG/ML IJ SOLN
INTRAMUSCULAR | Status: DC | PRN
  Administered 2012-03-28: 3 mg via INTRAVENOUS

## 2012-03-28 MED ORDER — NALOXONE HCL 0.4 MG/ML IJ SOLN: 0.4000 mg | INTRAMUSCULAR | Status: DC | PRN

## 2012-03-28 MED ORDER — THROMBIN 20000 UNITS EX KIT
PACK | CUTANEOUS | Status: DC | PRN
  Administered 2012-03-28 (×2): via TOPICAL

## 2012-03-28 MED ORDER — MIDAZOLAM HCL 5 MG/5ML IJ SOLN
INTRAMUSCULAR | Status: DC | PRN
  Administered 2012-03-28 (×2): 1 mg via INTRAVENOUS

## 2012-03-28 MED ORDER — MENTHOL 3 MG MT LOZG: 1.0000 | LOZENGE | OROMUCOSAL | Status: DC | PRN

## 2012-03-28 MED ORDER — ONDANSETRON HCL 4 MG/2ML IJ SOLN: 4.0000 mg | INTRAMUSCULAR | Status: DC | PRN

## 2012-03-28 MED ORDER — EPHEDRINE SULFATE 50 MG/ML IJ SOLN
INTRAMUSCULAR | Status: DC | PRN
  Administered 2012-03-28: 10 mg via INTRAVENOUS

## 2012-03-28 MED ORDER — ROCURONIUM BROMIDE 100 MG/10ML IV SOLN
INTRAVENOUS | Status: DC | PRN
  Administered 2012-03-28: 50 mg via INTRAVENOUS

## 2012-03-28 MED ORDER — ATENOLOL 25 MG PO TABS
25.0000 mg | ORAL_TABLET | Freq: Every day | ORAL | Status: DC
  Administered 2012-03-30: 25 mg via ORAL
  Filled 2012-03-28 (×3): qty 1

## 2012-03-28 MED ORDER — BUPIVACAINE-EPINEPHRINE PF 0.5-1:200000 % IJ SOLN
INTRAMUSCULAR | Status: DC | PRN
  Administered 2012-03-28: 10 mL

## 2012-03-28 MED ORDER — MORPHINE SULFATE 2 MG/ML IJ SOLN: 0.0500 mg/kg | INTRAMUSCULAR | Status: DC | PRN

## 2012-03-28 MED ORDER — ONDANSETRON HCL 4 MG/2ML IJ SOLN
INTRAMUSCULAR | Status: DC | PRN
  Administered 2012-03-28: 4 mg via INTRAVENOUS

## 2012-03-28 MED ORDER — ACETAMINOPHEN 650 MG RE SUPP: 650.0000 mg | RECTAL | Status: DC | PRN

## 2012-03-28 MED ORDER — LATANOPROST 0.005 % OP SOLN
1.0000 [drp] | Freq: Every day | OPHTHALMIC | Status: DC
  Administered 2012-03-28 – 2012-03-30 (×3): 1 [drp] via OPHTHALMIC
  Filled 2012-03-28: qty 2.5

## 2012-03-28 MED ORDER — DEXAMETHASONE SODIUM PHOSPHATE 4 MG/ML IJ SOLN
INTRAMUSCULAR | Status: DC | PRN
  Administered 2012-03-28: 8 mg via INTRAVENOUS

## 2012-03-28 MED ORDER — THROMBIN 20000 UNITS EX KIT
PACK | CUTANEOUS | Status: DC | PRN
  Administered 2012-03-28 (×2): 20000 [IU] via TOPICAL

## 2012-03-28 MED ORDER — HYDROMORPHONE HCL PF 1 MG/ML IJ SOLN
0.2500 mg | INTRAMUSCULAR | Status: DC | PRN
  Administered 2012-03-28 (×2): 0.5 mg via INTRAVENOUS

## 2012-03-28 MED ORDER — LACTATED RINGERS IV SOLN
INTRAVENOUS | Status: DC
  Administered 2012-03-28: 20:00:00 via INTRAVENOUS

## 2012-03-28 MED ORDER — ZOLPIDEM TARTRATE 5 MG PO TABS: 5.0000 mg | ORAL_TABLET | Freq: Every evening | ORAL | Status: DC | PRN

## 2012-03-28 MED ORDER — 0.9 % SODIUM CHLORIDE (POUR BTL) OPTIME
TOPICAL | Status: DC | PRN
  Administered 2012-03-28: 1000 mL

## 2012-03-28 MED ORDER — PHENOL 1.4 % MT LIQD: 1.0000 | OROMUCOSAL | Status: DC | PRN

## 2012-03-28 MED ORDER — SODIUM CHLORIDE 0.9 % IV SOLN
INTRAVENOUS | Status: AC
  Filled 2012-03-28: qty 500

## 2012-03-28 MED ORDER — HYDROMORPHONE HCL PF 1 MG/ML IJ SOLN
INTRAMUSCULAR | Status: AC
  Filled 2012-03-28: qty 1

## 2012-03-28 MED ORDER — SODIUM CHLORIDE 0.9 % IJ SOLN: 9.0000 mL | INTRAMUSCULAR | Status: DC | PRN

## 2012-03-28 MED ORDER — BACITRACIN 50000 UNITS IM SOLR
INTRAMUSCULAR | Status: AC
  Filled 2012-03-28: qty 1

## 2012-03-28 MED ORDER — MORPHINE SULFATE (PF) 1 MG/ML IV SOLN
INTRAVENOUS | Status: AC
  Filled 2012-03-28: qty 25

## 2012-03-28 MED ORDER — DIPHENHYDRAMINE HCL 50 MG/ML IJ SOLN: 12.5000 mg | Freq: Four times a day (QID) | INTRAMUSCULAR | Status: DC | PRN

## 2012-03-28 MED ORDER — DIPHENHYDRAMINE HCL 12.5 MG/5ML PO ELIX: 12.5000 mg | ORAL_SOLUTION | Freq: Four times a day (QID) | ORAL | Status: DC | PRN

## 2012-03-28 MED ORDER — CEFAZOLIN SODIUM-DEXTROSE 2-3 GM-% IV SOLR
2.0000 g | Freq: Three times a day (TID) | INTRAVENOUS | Status: AC
  Administered 2012-03-28 – 2012-03-29 (×2): 2 g via INTRAVENOUS
  Filled 2012-03-28 (×2): qty 50

## 2012-03-28 MED ORDER — DIAZEPAM 5 MG PO TABS
5.0000 mg | ORAL_TABLET | Freq: Four times a day (QID) | ORAL | Status: DC | PRN
  Administered 2012-03-29 (×2): 5 mg via ORAL
  Filled 2012-03-28 (×2): qty 1

## 2012-03-28 MED ORDER — SODIUM CHLORIDE 0.9 % IR SOLN
Status: DC | PRN
  Administered 2012-03-28: 12:00:00

## 2012-03-28 MED ORDER — ACETAMINOPHEN 325 MG PO TABS: 650.0000 mg | ORAL_TABLET | ORAL | Status: DC | PRN

## 2012-03-28 MED ORDER — VECURONIUM BROMIDE 10 MG IV SOLR
INTRAVENOUS | Status: DC | PRN
  Administered 2012-03-28 (×2): 2 mg via INTRAVENOUS
  Administered 2012-03-28: 3 mg via INTRAVENOUS

## 2012-03-28 MED ORDER — DOCUSATE SODIUM 100 MG PO CAPS
100.0000 mg | ORAL_CAPSULE | Freq: Two times a day (BID) | ORAL | Status: DC
  Administered 2012-03-28 – 2012-03-31 (×6): 100 mg via ORAL
  Filled 2012-03-28 (×7): qty 1

## 2012-03-28 MED ORDER — GLYCOPYRROLATE 0.2 MG/ML IJ SOLN
INTRAMUSCULAR | Status: DC | PRN
  Administered 2012-03-28: .5 mg via INTRAVENOUS

## 2012-03-28 MED ORDER — MORPHINE SULFATE (PF) 1 MG/ML IV SOLN
INTRAVENOUS | Status: DC
  Administered 2012-03-28: 16:00:00 via INTRAVENOUS
  Administered 2012-03-28: 1 mg via INTRAVENOUS
  Administered 2012-03-29: 05:00:00 via INTRAVENOUS
  Administered 2012-03-29: 11 mg via INTRAVENOUS
  Administered 2012-03-29: 9 mg via INTRAVENOUS
  Administered 2012-03-29: 12 mg via INTRAVENOUS
  Filled 2012-03-28: qty 25

## 2012-03-28 MED ORDER — POTASSIUM CHLORIDE CRYS ER 20 MEQ PO TBCR
20.0000 meq | EXTENDED_RELEASE_TABLET | Freq: Every day | ORAL | Status: DC
  Administered 2012-03-29 – 2012-03-30 (×2): 20 meq via ORAL
  Filled 2012-03-28 (×3): qty 1

## 2012-03-28 MED ORDER — ONDANSETRON HCL 4 MG/2ML IJ SOLN: 4.0000 mg | Freq: Once | INTRAMUSCULAR | Status: DC | PRN

## 2012-03-28 MED ORDER — ATENOLOL-CHLORTHALIDONE 50-25 MG PO TABS: 0.5000 | ORAL_TABLET | Freq: Every day | ORAL | Status: DC

## 2012-03-28 MED ORDER — LACTATED RINGERS IV SOLN
INTRAVENOUS | Status: DC | PRN
  Administered 2012-03-28 (×3): via INTRAVENOUS

## 2012-03-28 MED ORDER — PROPOFOL 10 MG/ML IV EMUL
INTRAVENOUS | Status: DC | PRN
  Administered 2012-03-28: 120 mg via INTRAVENOUS

## 2012-03-28 MED ORDER — CHLORTHALIDONE 25 MG PO TABS
12.5000 mg | ORAL_TABLET | Freq: Every day | ORAL | Status: DC
  Administered 2012-03-30: 12.5 mg via ORAL
  Filled 2012-03-28 (×3): qty 0.5

## 2012-03-28 MED ORDER — POLYVINYL ALCOHOL 1.4 % OP SOLN
1.0000 [drp] | OPHTHALMIC | Status: DC | PRN
  Filled 2012-03-28: qty 15

## 2012-03-28 MED ORDER — LIDOCAINE HCL (CARDIAC) 20 MG/ML IV SOLN
INTRAVENOUS | Status: DC | PRN
  Administered 2012-03-28: 60 mg via INTRAVENOUS

## 2012-03-28 MED ORDER — ONDANSETRON HCL 4 MG/2ML IJ SOLN: 4.0000 mg | Freq: Four times a day (QID) | INTRAMUSCULAR | Status: DC | PRN

## 2012-03-28 MED ORDER — HYDROCODONE-ACETAMINOPHEN 5-325 MG PO TABS: 1.0000 | ORAL_TABLET | ORAL | Status: DC | PRN

## 2012-03-28 MED ORDER — OXYCODONE-ACETAMINOPHEN 5-325 MG PO TABS
1.0000 | ORAL_TABLET | ORAL | Status: DC | PRN
  Administered 2012-03-29 (×2): 2 via ORAL
  Administered 2012-03-29: 1 via ORAL
  Administered 2012-03-30 (×2): 2 via ORAL
  Administered 2012-03-31: 1 via ORAL
  Filled 2012-03-28 (×2): qty 2
  Filled 2012-03-28: qty 1
  Filled 2012-03-28: qty 2
  Filled 2012-03-28: qty 1
  Filled 2012-03-28: qty 2

## 2012-03-28 SURGICAL SUPPLY — 72 items
APL SKNCLS STERI-STRIP NONHPOA (GAUZE/BANDAGES/DRESSINGS) ×1
BAG DECANTER FOR FLEXI CONT (MISCELLANEOUS) ×2 IMPLANT
BENZOIN TINCTURE PRP APPL 2/3 (GAUZE/BANDAGES/DRESSINGS) ×2 IMPLANT
BLADE SURG 15 STRL LF DISP TIS (BLADE) IMPLANT
BLADE SURG 15 STRL SS (BLADE) ×2
BLADE SURG ROTATE 9660 (MISCELLANEOUS) IMPLANT
BRUSH SCRUB EZ PLAIN DRY (MISCELLANEOUS) ×2 IMPLANT
BUR ACORN 6.0 (BURR) ×2 IMPLANT
BUR MATCHSTICK NEURO 3.0 LAGG (BURR) ×2 IMPLANT
CANISTER SUCTION 2500CC (MISCELLANEOUS) ×2 IMPLANT
CAP REVERE LOCKING (Cap) ×4 IMPLANT
CLOTH BEACON ORANGE TIMEOUT ST (SAFETY) ×2 IMPLANT
CONT SPEC 4OZ CLIKSEAL STRL BL (MISCELLANEOUS) ×3 IMPLANT
COVER BACK TABLE 24X17X13 BIG (DRAPES) ×1 IMPLANT
COVER TABLE BACK 60X90 (DRAPES) ×2 IMPLANT
DRAPE C-ARM 42X72 X-RAY (DRAPES) ×4 IMPLANT
DRAPE LAPAROTOMY 100X72X124 (DRAPES) ×2 IMPLANT
DRAPE POUCH INSTRU U-SHP 10X18 (DRAPES) ×2 IMPLANT
DRAPE SURG 17X23 STRL (DRAPES) ×8 IMPLANT
ELECT BLADE 4.0 EZ CLEAN MEGAD (MISCELLANEOUS) ×2
ELECT REM PT RETURN 9FT ADLT (ELECTROSURGICAL) ×2
ELECTRODE BLDE 4.0 EZ CLN MEGD (MISCELLANEOUS) ×1 IMPLANT
ELECTRODE REM PT RTRN 9FT ADLT (ELECTROSURGICAL) ×1 IMPLANT
GAUZE SPONGE 4X4 16PLY XRAY LF (GAUZE/BANDAGES/DRESSINGS) ×2 IMPLANT
GLOVE BIO SURGEON STRL SZ8.5 (GLOVE) ×4 IMPLANT
GLOVE BIOGEL PI IND STRL 7.0 (GLOVE) IMPLANT
GLOVE BIOGEL PI IND STRL 7.5 (GLOVE) IMPLANT
GLOVE BIOGEL PI INDICATOR 7.0 (GLOVE) ×2
GLOVE BIOGEL PI INDICATOR 7.5 (GLOVE) ×1
GLOVE ECLIPSE 6.5 STRL STRAW (GLOVE) ×1 IMPLANT
GLOVE EXAM NITRILE LRG STRL (GLOVE) ×2 IMPLANT
GLOVE EXAM NITRILE MD LF STRL (GLOVE) IMPLANT
GLOVE EXAM NITRILE XL STR (GLOVE) IMPLANT
GLOVE EXAM NITRILE XS STR PU (GLOVE) IMPLANT
GLOVE SS BIOGEL STRL SZ 7 (GLOVE) IMPLANT
GLOVE SS BIOGEL STRL SZ 8 (GLOVE) ×2 IMPLANT
GLOVE SUPERSENSE BIOGEL SZ 7 (GLOVE) ×1
GLOVE SUPERSENSE BIOGEL SZ 8 (GLOVE) ×2
GLOVE SURG SS PI 6.5 STRL IVOR (GLOVE) ×4 IMPLANT
GLOVE SURG SS PI 8.0 STRL IVOR (GLOVE) ×1 IMPLANT
GOWN BRE IMP SLV AUR LG STRL (GOWN DISPOSABLE) ×4 IMPLANT
GOWN BRE IMP SLV AUR XL STRL (GOWN DISPOSABLE) ×4 IMPLANT
GOWN STRL REIN 2XL LVL4 (GOWN DISPOSABLE) IMPLANT
KIT BASIN OR (CUSTOM PROCEDURE TRAY) ×2 IMPLANT
KIT ROOM TURNOVER OR (KITS) ×2 IMPLANT
NDL HYPO 21X1.5 SAFETY (NEEDLE) IMPLANT
NEEDLE HYPO 21X1.5 SAFETY (NEEDLE) IMPLANT
NEEDLE HYPO 22GX1.5 SAFETY (NEEDLE) ×3 IMPLANT
NS IRRIG 1000ML POUR BTL (IV SOLUTION) ×2 IMPLANT
PACK FOAM VITOSS 10CC (Orthopedic Implant) ×1 IMPLANT
PACK LAMINECTOMY NEURO (CUSTOM PROCEDURE TRAY) ×2 IMPLANT
PAD ARMBOARD 7.5X6 YLW CONV (MISCELLANEOUS) ×6 IMPLANT
PATTIES SURGICAL .5 X1 (DISPOSABLE) IMPLANT
PUTTY 10ML ACTIFUSE ABX (Putty) ×1 IMPLANT
ROD REVERE 6.35 40MM (Rod) ×2 IMPLANT
SCREW 7.5X50MM (Screw) ×4 IMPLANT
SPACER SUSTAIN O SML 8X22 12MM (Spacer) ×2 IMPLANT
SPONGE GAUZE 4X4 12PLY (GAUZE/BANDAGES/DRESSINGS) ×2 IMPLANT
SPONGE LAP 4X18 X RAY DECT (DISPOSABLE) IMPLANT
SPONGE NEURO XRAY DETECT 1X3 (DISPOSABLE) IMPLANT
SPONGE SURGIFOAM ABS GEL 100 (HEMOSTASIS) ×2 IMPLANT
STRIP CLOSURE SKIN 1/2X4 (GAUZE/BANDAGES/DRESSINGS) ×2 IMPLANT
SUT VIC AB 1 CT1 18XBRD ANBCTR (SUTURE) ×2 IMPLANT
SUT VIC AB 1 CT1 8-18 (SUTURE) ×4
SUT VIC AB 2-0 CP2 18 (SUTURE) ×5 IMPLANT
SYR 20CC LL (SYRINGE) IMPLANT
SYR 20ML ECCENTRIC (SYRINGE) ×2 IMPLANT
TAPE CLOTH SURG 4X10 WHT LF (GAUZE/BANDAGES/DRESSINGS) ×1 IMPLANT
TOWEL OR 17X24 6PK STRL BLUE (TOWEL DISPOSABLE) ×2 IMPLANT
TOWEL OR 17X26 10 PK STRL BLUE (TOWEL DISPOSABLE) ×2 IMPLANT
TRAY FOLEY CATH 14FRSI W/METER (CATHETERS) ×2 IMPLANT
WATER STERILE IRR 1000ML POUR (IV SOLUTION) ×2 IMPLANT

## 2012-03-28 NOTE — Transfer of Care (Signed)
Immediate Anesthesia Transfer of Care Note  Patient: Shirley [name]  Procedure(s) Performed: Procedure(s) (LRB): POSTERIOR LUMBAR FUSION 1 LEVEL (N/A)  Patient Location: PACU  Anesthesia Type: General  Level of Consciousness: awake, alert  and oriented  Airway & Oxygen Therapy: Patient Spontanous Breathing and Patient connected to nasal cannula oxygen  Post-op Assessment: Report given to PACU RN, Post -op Vital signs reviewed and stable and Patient moving all extremities X 4  Post vital signs: Reviewed and stable  Complications: No apparent anesthesia complications

## 2012-03-28 NOTE — Progress Notes (Signed)
Patient ID: Shirley [name], female   DOB: Sep 30, 1934, 78 y.o.   MRN: [id number] Subjective:  The patient is alert but a bit confused.  Objective: Vital signs in last 24 hours: Temp:  [97.4 F (36.3 C)-97.8 F (36.6 C)] 97.8 F (36.6 C) (04/24 1502) Pulse Rate:  [68-69] 68  (04/24 1502) Resp:  [18] 18  (04/24 1502) BP: (134-139)/(77-89) 137/77 mmHg (04/24 1515) SpO2:  [100 %] 100 % (04/24 0828)  Intake/Output from previous day:   Intake/Output this shift: Total I/O In: 2200 [I.V.:2200] Out: 545 [Urine:295; Blood:250]  Physical exam the patient is alert and oriented x1. She is moving all 4 extremities well.  Lab Results: No results found for this basename: WBC:2,HGB:2,HCT:2,PLT:2 in the last 72 hours BMET No results found for this basename: NA:2,K:2,CL:2,CO2:2,GLUCOSE:2,BUN:2,CREATININE:2,CALCIUM:2 in the last 72 hours  Studies/Results: Dg Lumbar Spine 1 View  03/28/2012  *RADIOLOGY REPORT*  Clinical Data: 78 year old female undergoing lumbar surgery.  LUMBAR SPINE - 1 VIEW  Comparison: [facility] lumbar radiographs 02/21/2012 and earlier.  Including [facility] lumbar MRI 01/31/2012.  Findings: Intraoperative portable cross-table lateral view the lumbar spine labeled film #1 at 1140 hours.  The same numbering system utilized as on the MRI designating the spondylolisthesis at L4-L5 and L5-S1.  Surgical probe on this image projects at the L4- L5 disc space level.  IMPRESSION: Intraoperative localization at L4-L5.  Original Report Authenticated By: Harley Hallmark, M.D.    Assessment/Plan: The patient is doing well. Her confusion should clear with time.  LOS: 0 days     Shirley [name] D 03/28/2012, 3:34 PM

## 2012-03-28 NOTE — Anesthesia Preprocedure Evaluation (Signed)
Anesthesia Evaluation  Patient identified by MRN, date of birth, ID band Patient awake    Reviewed: Allergy & Precautions, H&P , NPO status , Patient's Chart, lab work & pertinent test results  Airway Mallampati: I TM Distance: >3 FB Neck ROM: Full    Dental   Pulmonary          Cardiovascular hypertension, Pt. on medications     Neuro/Psych    GI/Hepatic   Endo/Other    Renal/GU      Musculoskeletal   Abdominal   Peds  Hematology   Anesthesia Other Findings   Reproductive/Obstetrics                           Anesthesia Physical Anesthesia Plan  ASA: II  Anesthesia Plan: General   Post-op Pain Management:    Induction: Intravenous  Airway Management Planned: Oral ETT  Additional Equipment:   Intra-op Plan:   Post-operative Plan: Extubation in OR  Informed Consent: I have reviewed the patients History and Physical, chart, labs and discussed the procedure including the risks, benefits and alternatives for the proposed anesthesia with the patient or authorized representative who has indicated his/her understanding and acceptance.     Plan Discussed with: CRNA and Surgeon  Anesthesia Plan Comments:         Anesthesia Quick Evaluation  

## 2012-03-28 NOTE — Progress Notes (Signed)
Pt coing of pain in left eye, eye noted to be red , DR. Ossey made aware.

## 2012-03-28 NOTE — Anesthesia Postprocedure Evaluation (Signed)
Anesthesia Post Note  Patient: Shirley [name]  Procedure(s) Performed: Procedure(s) (LRB): POSTERIOR LUMBAR FUSION 1 LEVEL (N/A)  Anesthesia type: general  Patient location: PACU  Post pain: Pain level controlled  Post assessment: Patient's Cardiovascular Status Stable  Last Vitals:  Filed Vitals:   03/28/12 1515  BP: 137/77  Pulse:   Temp:   Resp:     Post vital signs: Reviewed and stable  Level of consciousness: sedated  Complications: No apparent anesthesia complications

## 2012-03-28 NOTE — H&P (Signed)
Subjective: The patient is a 78 year old white female who has had chronic back buttocks and leg pain consistent with neurogenic claudication. She has failed medical management and was worked up with a lumbar MRI. As demonstrated spinal stenosis at L4-5. I discussed the various treatment options with the patient including surgery. She has weighed the risks, benefits, and alternatives surgery decided proceed with an L4-5 decompression, instrumentation, and fusion.   Past Medical History  Diagnosis Date  . Osteoarthritis   . Hypertension   . Osteoporosis   . PMR (polymyalgia rheumatica)   . Tobacco abuse   . Glucose intolerance (impaired glucose tolerance)     Past Surgical History  Procedure Date  . Abdominal hysterectomy     1970S  . Shoulder surgery     RIGHT SH OULDER 2012    . Shoulder adhesion release     RIGHT     REPLACEMENT   06/2011    . Cataract extraction w/ intraocular lens  implant, bilateral     2008     No Known Allergies  History  Substance Use Topics  . Smoking status: Former Games developer  . Smokeless tobacco: Not on file  . Alcohol Use: Yes    No family history on file. Prior to Admission medications   Medication Sig Start Date End Date Taking? Authorizing Provider  atenolol-chlorthalidone (TENORETIC) 50-25 MG per tablet Take 0.5 tablets by mouth daily. 08/01/11  Yes Roderick Pee, MD  KLOR-CON M20 20 MEQ tablet Take 20 mEq by mouth. Half tab 08/01/11  Yes Historical Provider, MD  latanoprost (XALATAN) 0.005 % ophthalmic solution Place 1 drop into both eyes at bedtime.    Yes Historical Provider, MD     Review of Systems  Positive ROS: As above  All other systems have been reviewed and were otherwise negative with the exception of those mentioned in the HPI and as above.  Objective: Vital signs in last 24 hours: Temp:  [97.4 F (36.3 C)] 97.4 F (36.3 C) (04/24 0828) Pulse Rate:  [69] 69  (04/24 0828) Resp:  [18] 18  (04/24 0828) BP: (139)/(89) 139/89 mmHg  (04/24 0828) SpO2:  [100 %] 100 % (04/24 0828)  General Appearance: Alert, cooperative, no distress, appears stated age Head: Normocephalic, without obvious abnormality, atraumatic Eyes: PERRL, conjunctiva/corneas clear, EOM's intact, fundi benign, both eyes      Ears: Normal TM's and external ear canals, both ears Throat: Lips, mucosa, and tongue normal; teeth and gums normal Neck: Supple, symmetrical, trachea midline, no adenopathy; thyroid: No enlargement/tenderness/nodules; no carotid bruit or JVD Back: Symmetric, no curvature, ROM normal, no CVA tenderness Lungs: Clear to auscultation bilaterally, respirations unlabored Heart: Regular rate and rhythm, S1 and S2 normal, no murmur, rub or gallop Abdomen: Soft, non-tender, bowel sounds active all four quadrants, no masses, no organomegaly Extremities: Extremities normal, atraumatic, no cyanosis or edema Pulses: 2+ and symmetric all extremities Skin: Skin color, texture, turgor normal, no rashes or lesions  NEUROLOGIC:   Mental status: alert and oriented, no aphasia, good attention span, Fund of knowledge/ memory ok Motor Exam - grossly normal Sensory Exam - grossly normal Reflexes: Symmetric Coordination - grossly normal Gait - grossly normal Balance - grossly normal Cranial Nerves: I: smell Not tested  II: visual acuity  OS: Normal    OD: Normal   II: visual fields Full to confrontation  II: pupils Equal, round, reactive to light  III,VII: ptosis None  III,IV,VI: extraocular muscles  Full ROM  V: mastication Normal  V: facial light touch sensation  Normal  V,VII: corneal reflex  Present  VII: facial muscle function - upper  Normal  VII: facial muscle function - lower Normal  VIII: hearing Not tested  IX: soft palate elevation  Normal  IX,X: gag reflex Present  XI: trapezius strength  5/5  XI: sternocleidomastoid strength 5/5  XI: neck flexion strength  5/5  XII: tongue strength  Normal    Data Review Lab Results    Component Value Date   WBC 9.5 03/21/2012   HGB 16.1* 03/21/2012   HCT 47.5* 03/21/2012   MCV 99.0 03/21/2012   PLT 203 03/21/2012   Lab Results  Component Value Date   NA 140 03/21/2012   K 4.0 03/21/2012   CL 100 03/21/2012   CO2 30 03/21/2012   BUN 16 03/21/2012   CREATININE 0.69 03/21/2012   GLUCOSE 102* 03/21/2012   Lab Results  Component Value Date   INR 0.97 06/16/2011    Assessment/Plan: L4-5 spondylolisthesis, spinal stenosis, lumbar radiculopathy/neurogenic claudication, lumbago: I discussed the situation with the patient. I reviewed her MR scan with her and pointed out the abnormalities. We have discussed the various treatment options including surgery. I've shown her surgical models. We discussed the surgical option of a L4-5 decompression, each patient, and fusion. I discussed the risks, benefits, alternatives and likelihood of achieving our goals with surgery. I have answered all the patient's questions. She would like to proceed with surgery.    Cleon Thoma D 03/28/2012 10:30 AM

## 2012-03-28 NOTE — Progress Notes (Signed)
Report to Sharlot Gowda, RN for primary caregiver.

## 2012-03-28 NOTE — Op Note (Signed)
Brief history: The patient is a 78 year old white female who presents with chronic back and leg pain consistent with neurogenic claudication. She has failed medical management and was worked up with a lumbar MRI. This demonstrated she had severe spinal stenosis at L4-5 with a spondylolisthesis. I discussed situation with the patient. We discussed the various treatment options including surgery. The patient has weighed the risks, benefits, and alternatives surgery decided proceed with an L4-5 decompression instrumentation and fusion.  Preoperative diagnosis: L4-5 spondylolisthesis, Degenerative disc disease, spinal stenosis; lumbago; lumbar radiculopathy  Postoperative diagnosis: L4-5 spondylolisthesis, Degenerative disc disease, spinal stenosis; lumbago; lumbar radiculopathy  Procedure: Bilateral L4 Laminotomy/foraminotomies to decompress the bilateral L4 and L5 nerve roots(the work required to do this was in addition to the work required to do the posterior lumbar interbody fusion because of the patient's spinal stenosis, facet arthropathy. Etc. requiring a wide decompression of the nerve roots.); L4-5 posterior lumbar interbody fusion with local morselized autograft bone and Actifusebone graft extender; insertion of interbody prosthesis at L4-5 (globus peek interbody prosthesis); posterior nonsegmental instrumentation from L4 to L5 with globus titanium pedicle screws and rods; posterior lateral arthrodesis at L4-5 with local morselized autograft bone and Vitoss bone graft extender.  Surgeon: Dr. Delma Officer  Asst.: Dr. Barbaraann Barthel  Anesthesia: Gen. endotracheal  Estimated blood loss: 200 cc  Drains: None  Locations: None  Description of procedure: The patient was brought to the operating room by the anesthesia team. General endotracheal anesthesia was induced. The patient was turned to the prone position on the Wilson frame. The patient's lumbosacral region was then prepared with Betadine  scrub and Betadine solution. Sterile drapes were applied.  I then injected the area to be incised with Marcaine with epinephrine solution. I then used the scalpel to make a linear midline incision over the L4-5 interspace. I then used electrocautery to perform a bilateral subperiosteal dissection exposing the spinous process and lamina of L4 and L5. We then obtained intraoperative radiograph to confirm our location. We then inserted the Verstrac retractor to provide exposure.  I began the decompression by using the high speed drill to perform laminotomies at L4. We then used the Kerrison punches to widen the laminotomy and removed the ligamentum flavum at L4-5. We used the Kerrison punches to remove the medial facets at L4-5. We performed wide foraminotomies about the bilateral L4 and L5 nerve roots completing the decompression.  We now turned our attention to the posterior lumbar interbody fusion. I used a scalpel to incise the intervertebral disc at L4-5. I then performed a partial intervertebral discectomy at L4-5 using the pituitary forceps. We prepared the vertebral endplates at L4-5 for the fusion by removing the soft tissues with the curettes. We then used the trial spacers to pick the appropriate sized interbody prosthesis. We prefilled his prosthesis with a combination of local morselized autograft bone that we obtained during the decompression as well as Actifuse bone graft extender. We inserted the prefilled prosthesis into the interspace at L4-5. There was a good snug fit of the prosthesis in the interspace. We then filled and the remainder of the intervertebral disc space with local morselized autograft bone and Actifuse. This completed the posterior lumbar interbody arthrodesis.  We now turned attention to the instrumentation. Under fluoroscopic guidance we cannulated the bilateral L4 and L5 pedicles with the bone probe. We then removed the bone probe. He then tapped the pedicle with a 6.5  millimeter tap. We then removed the tap. We probed inside the  tapped pedicle with a ball probe to rule out cortical breaches. We then inserted a 7.5 x 50 millimeter pedicle screw into the L4 and L5 pedicles bilaterally under fluoroscopic guidance. We then palpated along the medial aspect of the pedicles to rule out cortical breaches. There were none. The nerve roots were not injured. We then connected the unilateral pedicle screws with a lordotic rod. We compressed the construct and secured the rod in place with the caps. We then tightened the caps appropriately. This completed the instrumentation from L4-L5.  We now turned our attention to the posterior lateral arthrodesis at L4-5. We used the high-speed drill to decorticate the remainder of the facets, pars, transverse process at L4-5. We then applied a combination of local morselized autograft bone and Vitoss bone graft extender over these decorticated posterior lateral structures. This completed the posterior lateral arthrodesis.  We then obtained hemostasis using bipolar electrocautery. We irrigated the wound out with bacitracin solution. We inspected the thecal sac and nerve roots and noted they were well decompressed. We then removed the retractor. We reapproximated patient's thoracolumbar fascia with interrupted #1 Vicryl suture. We reapproximated patient's subcutaneous tissue with interrupted 2-0 Vicryl suture. The reapproximated patient's skin with Steri-Strips and benzoin. The wound was then coated with bacitracin ointment. A sterile dressing was applied. The drapes were removed. The patient was subsequently returned to the supine position where they were extubated by the anesthesia team. He was then transported to the post anesthesia care unit in stable condition. All sponge instrument and needle counts were correct at the end of this case.

## 2012-03-28 NOTE — Preoperative (Signed)
Beta Blockers   Reason not to administer Beta Blockers:Not Applicable 

## 2012-03-28 NOTE — Anesthesia Procedure Notes (Signed)
Procedure Name: Intubation Date/Time: 03/28/2012 11:05 AM Performed by: Marena Chancy Pre-anesthesia Checklist: Patient identified, Emergency Drugs available, Suction available and Patient being monitored Patient Re-evaluated:Patient Re-evaluated prior to inductionOxygen Delivery Method: Circle system utilized Preoxygenation: Pre-oxygenation with 100% oxygen Intubation Type: IV induction Ventilation: Mask ventilation without difficulty Laryngoscope Size: Mac and 3 Grade View: Grade I Tube type: Oral Tube size: 7.0 mm Number of attempts: 1 Airway Equipment and Method: Stylet Placement Confirmation: positive ETCO2,  CO2 detector and breath sounds checked- equal and bilateral Secured at: 21 cm Tube secured with: Tape Dental Injury: Teeth and Oropharynx as per pre-operative assessment

## 2012-03-28 NOTE — Progress Notes (Signed)
Pca started at low dose per policy

## 2012-03-29 MED ORDER — MORPHINE SULFATE 2 MG/ML IJ SOLN: 2.0000 mg | INTRAMUSCULAR | Status: DC | PRN

## 2012-03-29 NOTE — Evaluation (Signed)
Occupational Therapy Evaluation Patient Details Name: Shirley [name] MRN: [id number] DOB: Dec 25, 1933 Today's Date: 03/29/2012 Time: 4696-2952 OT Time Calculation (min): 23 min  OT Assessment / Plan / Recommendation Clinical Impression  78 yo female s/p PLF level 1 that could benefit from skilled OT acutely. Recommend HHOT    OT Assessment  Patient needs continued OT Services    Follow Up Recommendations  Home health OT    Equipment Recommendations  Defer to next venue    Frequency Min 2X/week    Precautions / Restrictions Precautions Precautions: Back Precaution Booklet Issued: Yes (comment) Precaution Comments: pt educated on 3/3 back precautions Required Braces or Orthoses: Spinal Brace Spinal Brace: Lumbar corset;Applied in sitting position Restrictions Weight Bearing Restrictions: No   Pertinent Vitals/Pain     ADL  Eating/Feeding: Performed;Modified independent Where Assessed - Eating/Feeding: Chair Grooming: Performed;Wash/dry hands;Wash/dry face;Supervision/safety Where Assessed - Grooming: Standing at sink Lower Body Bathing: Simulated;Minimal assistance (unable to reach feet supine crossing BIL LE) Where Assessed - Lower Body Bathing: Supine, head of bed up Toilet Transfer: Simulated;Supervision/safety Toilet Transfer Method: Proofreader: Raised toilet seat with arms (or 3-in-1 over toilet) Equipment Used: Back brace;Gait belt Ambulation Related to ADLs: Pt ambulating Supervision level  ADL Comments: Pt educated on proper fit of aspen corset at EOB. Pt educated on bed mobility demonstrating x3. pt with poor recall s/p ambulation in hall. Pt completed grooming task and then requesting to ambulate in hall. Pt with decreased speed with turning. pt required Min v/c for locating room.    OT Goals Acute Rehab OT Goals OT Goal Formulation: With patient Time For Goal Achievement: 04/12/12 Potential to Achieve Goals: Good ADL Goals Pt Will  Perform Upper Body Bathing: with modified independence;Sit to stand from bed;Sit to stand from chair ADL Goal: Upper Body Bathing - Progress: Goal set today Pt Will Perform Lower Body Bathing: with modified independence;Sit to stand from chair;Sit to stand from bed ADL Goal: Lower Body Bathing - Progress: Goal set today Pt Will Perform Upper Body Dressing: with modified independence;Sit to stand from chair;Sit to stand from bed ADL Goal: Upper Body Dressing - Progress: Goal set today Pt Will Perform Lower Body Dressing: with modified independence;Sit to stand from bed;Sit to stand from chair ADL Goal: Lower Body Dressing - Progress: Goal set today Pt Will Transfer to Toilet: with modified independence;3-in-1 ADL Goal: Toilet Transfer - Progress: Goal set today Pt Will Perform Toileting - Clothing Manipulation: with modified independence;Sitting on 3-in-1 or toilet ADL Goal: Toileting - Clothing Manipulation - Progress: Goal set today Pt Will Perform Toileting - Hygiene: with modified independence;Sit to stand from 3-in-1/toilet ADL Goal: Toileting - Hygiene - Progress: Goal set today Miscellaneous OT Goals Miscellaneous OT Goal #1: Pt will complete bed mobility Mod I without cues needed following back precautions as precursor to Adls OT Goal: Miscellaneous Goal #1 - Progress: Goal set today  Visit Information  Last OT Received On: 03/29/12 Assistance Needed: +1    Subjective Data  Subjective: "I will get this bed thing down"   Prior Functioning  Home Living Lives With: Spouse Available Help at Discharge: Family Type of Home: House Home Access: Stairs to enter Secretary/administrator of Steps: 1 Entrance Stairs-Rails: None Home Layout: Two level Alternate Level Stairs-Number of Steps: 6 Alternate Level Stairs-Rails: Right Bathroom Shower/Tub: Tub/shower unit;Curtain Bathroom Toilet: Standard Bathroom Accessibility: Yes How Accessible: Accessible via walker Home Adaptive  Equipment: None Prior Function Level of Independence: Independent Able to Take Stairs?:  Yes Driving: Yes Vocation: Retired Musician: No difficulties Dominant Hand: Right    Cognition  Overall Cognitive Status: Appears within functional limits for tasks assessed/performed Arousal/Alertness: Awake/alert Orientation Level: Oriented X4 / Intact Behavior During Session: WFL for tasks performed    Extremity/Trunk Assessment Right Upper Extremity Assessment RUE ROM/Strength/Tone: Within functional levels;WFL for tasks assessed Left Upper Extremity Assessment LUE ROM/Strength/Tone: Within functional levels;WFL for tasks assessed Right Lower Extremity Assessment RLE ROM/Strength/Tone: Within functional levels RLE Sensation: WFL - Light Touch Left Lower Extremity Assessment LLE ROM/Strength/Tone: Within functional levels LLE Sensation: WFL - Light Touch Trunk Assessment Trunk Assessment: Normal   Mobility Bed Mobility Bed Mobility: Supine to Sit;Sitting - Scoot to Edge of Bed;Sit to Supine Supine to Sit: 4: Min assist;HOB flat Sitting - Scoot to [place] of Bed: 4: Min assist Sit to Supine: 4: Min assist;HOB flat Details for Bed Mobility Assistance: VC for sequencing to maintain back precautions throughout transfer. Transfers Transfers: Sit to Stand;Stand to Sit Stand to Sit: 5: Supervision;With upper extremity assist;To bed   Exercise    Balance    End of Session OT - End of Session Equipment Utilized During Treatment: Gait belt;Back brace Activity Tolerance: Patient tolerated treatment well Patient left: in bed;with call bell/phone within reach   Lucile Shutters 03/29/2012, 3:06 PM Pager: [phone]

## 2012-03-29 NOTE — Progress Notes (Signed)
CSW received consult for SNF. PT is not recommending SNF. RNCM is following for discharge needs. CSW is signing off as no further discharge needs identified. Please reconsult if a need arises prior to discharge.   Dede Query, MSW, Theresia Majors [phone]

## 2012-03-29 NOTE — Progress Notes (Signed)
Patient ID: Shirley [name], female   DOB: [birth year], 78 y.o.   MRN: [id number] Subjective:  The patient is alert and pleasant. Her back is sore as expected. She looks and feels well.  Objective: Vital signs in last 24 hours: Temp:  [97 F (36.1 C)-98.5 F (36.9 C)] 98.3 F (36.8 C) (04/25 0907) Pulse Rate:  [52-79] 79  (04/25 0907) Resp:  [11-22] 18  (04/25 0907) BP: (98-148)/(60-78) 98/60 mmHg (04/25 0907) SpO2:  [97 %-100 %] 99 % (04/25 0907) Weight:  [54.7 kg (120 lb 9.5 oz)] 54.7 kg (120 lb 9.5 oz) (04/25 0000)  Intake/Output from previous day: 04/24 0701 - 04/25 0700 In: 2300 [I.V.:2200] Out: 2845 [Urine:2595; Blood:250] Intake/Output this shift: Total I/O In: 720 [P.O.:720] Out: -   Physical exam the patient is alert and oriented x3. Her strength is normal in her lower extremities.  Lab Results: No results found for this basename: WBC:2,HGB:2,HCT:2,PLT:2 in the last 72 hours BMET No results found for this basename: NA:2,K:2,CL:2,CO2:2,GLUCOSE:2,BUN:2,CREATININE:2,CALCIUM:2 in the last 72 hours  Studies/Results: Dg Lumbar Spine 2-3 Views  03/28/2012  *RADIOLOGY REPORT*  Clinical Data: L4-5 PLIF  C-ARM 1-60 MIN,LUMBAR SPINE - 2-3 VIEW  Comparison: Lumbar spine radiographs at [facility] 02/21/2012.  Findings: Two intraoperative fluoroscopic spot films are submitted. The patient is status post L4-5 PLIF.  Disc spacers are in place. Bilateral pedicle screws are in place at the L4 and L5.  The anterolisthesis is slightly less prominent than on the prior exam. The connecting rods have not been placed.  IMPRESSION:  1.  Intraoperative images of L4-5 PLIF without radiographic evidence for complication.  Original Report Authenticated By: Jamesetta Orleans. MATTERN, M.D.   Dg Lumbar Spine 1 View  03/28/2012  *RADIOLOGY REPORT*  Clinical Data: 78 year old female undergoing lumbar surgery.  LUMBAR SPINE - 1 VIEW  Comparison: [facility] lumbar radiographs 02/21/2012 and  earlier.  Including [facility] lumbar MRI 01/31/2012.  Findings: Intraoperative portable cross-table lateral view the lumbar spine labeled film #1 at 1140 hours.  The same numbering system utilized as on the MRI designating the spondylolisthesis at L4-L5 and L5-S1.  Surgical probe on this image projects at the L4- L5 disc space level.  IMPRESSION: Intraoperative localization at L4-L5.  Original Report Authenticated By: Harley Hallmark, M.D.   Dg C-arm 1-60 Min  03/28/2012  *RADIOLOGY REPORT*  Clinical Data: L4-5 PLIF  C-ARM 1-60 MIN,LUMBAR SPINE - 2-3 VIEW  Comparison: Lumbar spine radiographs at [facility] 02/21/2012.  Findings: Two intraoperative fluoroscopic spot films are submitted. The patient is status post L4-5 PLIF.  Disc spacers are in place. Bilateral pedicle screws are in place at the L4 and L5.  The anterolisthesis is slightly less prominent than on the prior exam. The connecting rods have not been placed.  IMPRESSION:  1.  Intraoperative images of L4-5 PLIF without radiographic evidence for complication.  Original Report Authenticated By: Jamesetta Orleans. MATTERN, M.D.    Assessment/Plan: Postop day 1: The patient is doing well. I will discharge continue her PCA pump. She will likely go home this weekend. I have given her her discharge instructions and answered all her questions.  LOS: 1 day     Sequoyah Counterman D 03/29/2012, 10:16 AM

## 2012-03-29 NOTE — Evaluation (Signed)
Physical Therapy Evaluation Patient Details Name: Shirley [name] MRN: [id number] DOB: Apr 27, 1934 Today's Date: 03/29/2012 Time: 4098-1191 PT Time Calculation (min): 15 min  PT Assessment / Plan / Recommendation Clinical Impression  Pt presents s/p PLF along with the following impairments/deficits and therapy diagnosis listed below. Pt will benefit from skilled PT in the acute care setting in order to maximize functional mobility prior to d/c    PT Assessment  Patient needs continued PT services    Follow Up Recommendations  No PT follow up;Supervision - Intermittent    Equipment Recommendations  Defer to next venue;None recommended by PT    Frequency Min 5X/week    Precautions / Restrictions Precautions Precautions: Back Precaution Booklet Issued: Yes (comment) Precaution Comments: pt educated on 3/3 back precautions Required Braces or Orthoses: Spinal Brace Spinal Brace: Lumbar corset;Applied in sitting position Restrictions Weight Bearing Restrictions: No     Mobility  Bed Mobility Bed Mobility: Supine to Sit;Sitting - Scoot to Edge of Bed;Sit to Supine Supine to Sit: 4: Min assist;HOB flat Sitting - Scoot to [place] of Bed: 4: Min assist Sit to Supine: 4: Min assist;HOB flat Details for Bed Mobility Assistance: VC for sequencing to maintain back precautions throughout transfer. Transfers Transfers: Sit to Stand;Stand to Sit Sit to Stand: 4: Min assist;With upper extremity assist;From bed Stand to Sit: 5: Supervision;With upper extremity assist;To chair/3-in-1 Details for Transfer Assistance: VC for hand placement and sequencing to maintain back precautions Ambulation/Gait Ambulation/Gait Assistance: 4: Min assist Ambulation Distance (Feet): 30 Feet Assistive device: Rolling walker Ambulation/Gait Assistance Details: Assist for stability with ambulation. VC for upright posture as well as proper sequencing Gait Pattern: Step-to pattern;Trunk flexed;Decreased hip/knee  flexion - right;Decreased hip/knee flexion - left;Decreased stride length Gait velocity: decreased gait speed        PT Goals Acute Rehab PT Goals PT Goal Formulation: With patient Time For Goal Achievement: 04/05/12 Potential to Achieve Goals: Good Pt will Roll Supine to Right Side: with modified independence PT Goal: Rolling Supine to Right Side - Progress: Goal set today Pt will go Supine/Side to Sit: with modified independence PT Goal: Supine/Side to Sit - Progress: Goal set today Pt will go Sit to Supine/Side: with modified independence PT Goal: Sit to Supine/Side - Progress: Goal set today Pt will go Sit to Stand: with modified independence PT Goal: Sit to Stand - Progress: Goal set today Pt will go Stand to Sit: with modified independence PT Goal: Stand to Sit - Progress: Goal set today Pt will Transfer Bed to Chair/Chair to Bed: with modified independence PT Transfer Goal: Bed to Chair/Chair to Bed - Progress: Goal set today Pt will Ambulate: >150 feet;with modified independence;with least restrictive assistive device PT Goal: Ambulate - Progress: Goal set today Pt will Go Up / Down Stairs: 6-9 stairs;with rail(s);with supervision PT Goal: Up/Down Stairs - Progress: Goal set today  Visit Information  Assistance Needed: +1       Prior Functioning  Home Living Lives With: Spouse Available Help at Discharge: Family Type of Home: House Home Access: Stairs to enter Secretary/administrator of Steps: 1 Entrance Stairs-Rails: None Home Layout: Two level Alternate Level Stairs-Number of Steps: 6 Alternate Level Stairs-Rails: Right Bathroom Shower/Tub: Tub/shower unit;Curtain Firefighter: Standard Bathroom Accessibility: Yes How Accessible: Accessible via walker Home Adaptive Equipment: None Prior Function Level of Independence: Independent Able to Take Stairs?: Yes Driving: Yes Vocation: Retired Musician: No difficulties Dominant Hand: Right      Cognition  Overall Cognitive Status:  Appears within functional limits for tasks assessed/performed Arousal/Alertness: Awake/alert Orientation Level: Oriented X4 / Intact Behavior During Session: [facility] for tasks performed    Extremity/Trunk Assessment Right Upper Extremity Assessment RUE ROM/Strength/Tone: Within functional levels;WFL for tasks assessed Left Upper Extremity Assessment LUE ROM/Strength/Tone: Within functional levels;WFL for tasks assessed Right Lower Extremity Assessment RLE ROM/Strength/Tone: Within functional levels RLE Sensation: WFL - Light Touch Left Lower Extremity Assessment LLE ROM/Strength/Tone: Within functional levels LLE Sensation: WFL - Light Touch Trunk Assessment Trunk Assessment: Normal   Balance    End of Session PT - End of Session Equipment Utilized During Treatment: Gait belt;Back brace Activity Tolerance: Patient tolerated treatment well Patient left: in bed;with call bell/phone within reach Nurse Communication: Mobility status   Milana Kidney 03/29/2012, 3:12 PM  03/29/2012 Milana Kidney DPT PAGER: [phone] OFFICE: [phone]

## 2012-03-29 NOTE — Progress Notes (Signed)
CARE MANAGEMENT NOTE 03/29/2012  Patient:  Shirley [name], Shirley [name]   Account Number:  [id number]  Date Initiated:  03/29/2012  Documentation initiated by:  Vance Peper  Subjective/Objective Assessment:   78 yr old female s/p Bilateral L4 Laminectomy/foraminotomy with decompression.     Action/Plan:   Spoke with patient regarding possible home Health needs. States she doesnt think she needs any therapy. Will follow up  Status of service:  In process, will continue to follow Medicare Important Message given?

## 2012-03-30 MED FILL — Sodium Chloride Irrigation Soln 0.9%: Qty: 3000 | Status: AC

## 2012-03-30 MED FILL — Sodium Chloride IV Soln 0.9%: INTRAVENOUS | Qty: 1000 | Status: AC

## 2012-03-30 MED FILL — Heparin Sodium (Porcine) Inj 1000 Unit/ML: INTRAMUSCULAR | Qty: 30 | Status: AC

## 2012-03-30 NOTE — Progress Notes (Signed)
Occupational Therapy Treatment Patient Details Name: Shirley [name] MRN: [id number] DOB: 1934/01/22 Today's Date: 03/30/2012 Time: 4696-2952 OT Time Calculation (min): 12 min  OT Assessment / Plan / Recommendation Comments on Treatment Session pt poor progression due to poor recall of precautions. Pt with recent pain medication both sessions, question cognition due to medications    Follow Up Recommendations       Equipment Recommendations  Defer to next venue;Rolling walker with 5" wheels    Frequency     Plan      Precautions / Restrictions Precautions Precautions: Back Precaution Booklet Issued: Yes (comment) Precaution Comments: pt unable to recall any back precautions. pt reeducated on all back precautions Required Braces or Orthoses: Spinal Brace Spinal Brace: Lumbar corset;Applied in sitting position Restrictions Weight Bearing Restrictions: No   Pertinent Vitals/Pain     ADL  Grooming: Simulated;Wash/dry hands;Wash/dry face;Supervision/safety Where Assessed - Grooming: Standing at sink Toilet Transfer: Research scientist (life sciences) Method: Proofreader: Raised toilet seat with arms (or 3-in-1 over toilet) Toileting - Clothing Manipulation: Performed;Supervision/safety Where Assessed - Toileting Clothing Manipulation: Sit to stand from 3-in-1 or toilet Toileting - Hygiene: Performed;Modified independent Where Assessed - Toileting Hygiene: Sit to stand from 3-in-1 or toilet Tub/Shower Transfer: Performed;Minimal assistance Tub/Shower Transfer Method: Ambulating Equipment Used: Back brace;Gait belt Ambulation Related to ADLs: Pt ambulating Supervision level  ADL Comments: Pt required constant cueing and poor recall of all back precautions. pt demonstrates cognitive deficits    OT Goals ADL Goals Pt Will Perform Upper Body Bathing: with modified independence;Sit to stand from bed;Sit to stand from chair Pt Will Perform Lower  Body Bathing: with modified independence;Sit to stand from chair;Sit to stand from bed Pt Will Perform Upper Body Dressing: with modified independence;Sit to stand from chair;Sit to stand from bed Pt Will Perform Lower Body Dressing: with modified independence;Sit to stand from bed;Sit to stand from chair Pt Will Transfer to Toilet: with modified independence;3-in-1 ADL Goal: Toilet Transfer - Progress: Progressing toward goals Pt Will Perform Toileting - Clothing Manipulation: with modified independence;Sitting on 3-in-1 or toilet ADL Goal: Toileting - Clothing Manipulation - Progress: Progressing toward goals Pt Will Perform Toileting - Hygiene: with modified independence;Sit to stand from 3-in-1/toilet ADL Goal: Toileting - Hygiene - Progress: Progressing toward goals Miscellaneous OT Goals Miscellaneous OT Goal #1: Pt will complete bed mobility Mod I without cues needed following back precautions as precursor to Adls OT Goal: Miscellaneous Goal #1 - Progress: Progressing toward goals  Visit Information  Last OT Received On: 03/30/12 Assistance Needed: +1    Subjective Data      Prior Functioning       Cognition  Overall Cognitive Status: Appears within functional limits for tasks assessed/performed Arousal/Alertness: Awake/alert Orientation Level: Oriented X4 / Intact Behavior During Session: [facility] for tasks performed Cognition - Other Comments: pt slow to process, poor recall of precautions but demonstrates good long term memory    Mobility Bed Mobility Bed Mobility: Sit to Supine Supine to Sit: 5: Supervision Sitting - Scoot to Edge of Bed: 5: Supervision Sit to Supine: 4: Min guard;HOB flat Details for Bed Mobility Assistance: v/c and tactile cue to sequence Transfers Transfers: Sit to Stand;Stand to Sit Sit to Stand: 5: Supervision;With upper extremity assist;From bed Stand to Sit: 5: Supervision;With upper extremity assist;To chair/3-in-1 Details for Transfer  Assistance: VC for hand placement for safety upon standing to RW   Exercises    Balance    End of Session OT - End  of Session Equipment Utilized During Treatment: Gait belt;Back brace Activity Tolerance: Patient tolerated treatment well Patient left: in bed;with call bell/phone within reach Nurse Communication: Mobility status   Lucile Shutters 03/30/2012, 3:19 PM Pager: [phone]

## 2012-03-30 NOTE — Progress Notes (Signed)
   CARE MANAGEMENT NOTE 03/30/2012  Patient:  Shirley [name], Shirley [name]   Account Number:  [id number]  Date Initiated:  03/29/2012  Documentation initiated by:  Vance Peper  Subjective/Objective Assessment:   78 yr old female s/p Bilateral L4 Laminectomy/foraminotomy with decompression.     Action/Plan:   Spoke with patient regarding possible home Health needs. States she doesnt think she needs any therapy. Will follow up  03/30/2012 No HH recommended by PT/OT eval   Anticipated DC Date:  03/31/2012   Anticipated DC Plan:  HOME/SELF CARE      DC Planning Services  CM consult      Choice offered to / List presented to:             Status of service:  Completed, signed off Medicare Important Message given?   (If response is "NO", the following Medicare IM given date fields will be blank) Date Medicare IM given:   Date Additional Medicare IM given:    Discharge Disposition:    Per UR Regulation:    If discussed at Long Length of Stay Meetings, dates discussed:    Comments:  03/30/12 Patient has no home health needs.

## 2012-03-30 NOTE — Progress Notes (Signed)
CARE MANAGEMENT NOTE 03/30/2012 Comments:  03/30/12 Patient has no home health needs.

## 2012-03-30 NOTE — Progress Notes (Signed)
Physical Therapy Treatment Patient Details Name: Shirley [name] MRN: [id number] DOB: 1934-04-18 Today's Date: 03/30/2012 Time: 6213-0865 PT Time Calculation (min): 13 min  PT Assessment / Plan / Recommendation Comments on Treatment Session  Pt progressing well, pt is supervision with the majority of mobility. Attempt stairs tomorrow prior to d/c.     Follow Up Recommendations  No PT follow up;Supervision - Intermittent    Equipment Recommendations  Defer to next venue;Rolling walker with 5" wheels    Frequency Min 5X/week   Plan Discharge plan remains appropriate;Frequency remains appropriate    Precautions / Restrictions Precautions Precautions: Back Precaution Booklet Issued: Yes (comment) Precaution Comments: pt able to recall 2/3 back precautions. Pt reeducated on 3/3 back precautions Required Braces or Orthoses: Spinal Brace Spinal Brace: Lumbar corset;Applied in sitting position Restrictions Weight Bearing Restrictions: No       Mobility  Bed Mobility Bed Mobility: Supine to Sit;Sitting - Scoot to Edge of Bed Supine to Sit: 5: Supervision Sitting - Scoot to Edge of Bed: 5: Supervision Details for Bed Mobility Assistance: VC throughout for proper sequencing and safe technique to maintain back precautions. pt able to complete without physical assist although required increased time and effort Transfers Transfers: Sit to Stand Sit to Stand: 5: Supervision;With upper extremity assist;From bed Details for Transfer Assistance: VC for hand placement for safety upon standing to RW Ambulation/Gait Ambulation/Gait Assistance: 5: Supervision Ambulation Distance (Feet): 150 Feet Assistive device: Rolling walker Ambulation/Gait Assistance Details: VC for safety with distance to RW as well as postural cues throughout ambulation Gait Pattern: Step-to pattern;Trunk flexed;Decreased hip/knee flexion - right;Decreased hip/knee flexion - left;Decreased stride length Gait velocity:  decreased gait speed        PT Goals Acute Rehab PT Goals PT Goal Formulation: With patient PT Goal: Rolling Supine to Right Side - Progress: Progressing toward goal PT Goal: Supine/Side to Sit - Progress: Progressing toward goal PT Goal: Sit to Supine/Side - Progress: Progressing toward goal PT Goal: Sit to Stand - Progress: Progressing toward goal PT Goal: Stand to Sit - Progress: Progressing toward goal PT Transfer Goal: Bed to Chair/Chair to Bed - Progress: Progressing toward goal PT Goal: Ambulate - Progress: Progressing toward goal  Visit Information  Last PT Received On: 03/30/12    Subjective Data      Cognition  Overall Cognitive Status: Appears within functional limits for tasks assessed/performed Arousal/Alertness: Awake/alert Orientation Level: Oriented X4 / Intact Behavior During Session: [facility] for tasks performed       End of Session PT - End of Session Equipment Utilized During Treatment: Gait belt;Back brace Activity Tolerance: Patient tolerated treatment well Patient left: Other (comment) (with OT in gym) Nurse Communication: Mobility status    Milana Kidney 03/30/2012, 12:12 PM  03/30/2012 Milana Kidney DPT PAGER: [phone] OFFICE: [phone]

## 2012-03-31 MED ORDER — OXYCODONE-ACETAMINOPHEN 5-325 MG PO TABS: 1.0000 | ORAL_TABLET | ORAL | Status: AC | PRN

## 2012-03-31 NOTE — Progress Notes (Signed)
Subjective: Patient reports feeling better.  Objective: Vital signs in last 24 hours: Temp:  [97.3 F (36.3 C)-99.1 F (37.3 C)] 98.8 F (37.1 C) (04/27 1012) Pulse Rate:  [67-91] 83  (04/27 1012) Resp:  [18-20] 18  (04/27 1012) BP: (95-138)/(62-83) 109/71 mmHg (04/27 1012) SpO2:  [95 %-98 %] 98 % (04/27 1012)  Intake/Output from previous day: 04/26 0701 - 04/27 0700 In: 820 [P.O.:820] Out: -  Intake/Output this shift:    Physical Exam: Full strength bilateral lower extremities.  Dressing CDI.  Lab Results: No results found for this basename: WBC:2,HGB:2,HCT:2,PLT:2 in the last 72 hours BMET No results found for this basename: NA:2,K:2,CL:2,CO2:2,GLUCOSE:2,BUN:2,CREATININE:2,CALCIUM:2 in the last 72 hours  Studies/Results: No results found.  Assessment/Plan: Making steady progress. She has yet to move her bowels.  Wants to go home and work on bowels at home.    LOS: 3 days    Dorian Heckle, MD 03/31/2012, 12:19 PM

## 2012-03-31 NOTE — Progress Notes (Signed)
Physician Discharge Summary  Patient ID: NEIL ERRICKSON MRN: [id number] DOB/AGE: 1934-10-27 78 y.o.  Admit date: 03/28/2012 Discharge date: 03/31/2012  Admission Diagnoses: L 4 - 5 spondylolisthesis and stenosis  Discharge Diagnoses: Same Active Problems:  * No active hospital problems. *    Discharged Condition: good  Hospital Course: Uncomplicated decompression and fusion L 4 - 5  Consults: None  Significant Diagnostic Studies: None  Treatments: surgery: Uncomplicated decompression and fusion L 4 - 5  Discharge Exam: Blood pressure 109/71, pulse 83, temperature 98.8 F (37.1 C), temperature source Oral, resp. rate 18, height 5\' 5"  (1.651 m), weight 54.7 kg (120 lb 9.5 oz), SpO2 98.00%. Neurologic: Alert and oriented X 3, normal strength and tone. Normal symmetric reflexes. Normal coordination and gait Wound:CDI  Disposition: Home   Medication List  As of 03/31/2012 12:21 PM   ASK your doctor about these medications         atenolol-chlorthalidone 50-25 MG per tablet   Commonly known as: TENORETIC   Take 0.5 tablets by mouth daily.      KLOR-CON M20 20 MEQ tablet   Generic drug: potassium chloride SA   Take 20 mEq by mouth. Half tab      latanoprost 0.005 % ophthalmic solution   Commonly known as: XALATAN   Place 1 drop into both eyes at bedtime.             Signed: Dorian Heckle, MD 03/31/2012, 12:21 PM

## 2012-03-31 NOTE — Progress Notes (Signed)
Pt dc'd home. Dc papers discussed and signed. Iv removed, tip intact. Back precautions sheet given to pt and discussed. Pt left via wheelchair.

## 2012-04-03 NOTE — Discharge Summary (Signed)
Physician Discharge Summary  Patient ID: Shirley [name] MRN: [id number] DOB/AGE: 05/21/1934 78 y.o.  Admit date: 03/28/2012 Discharge date: 03/31/2012  Admission Diagnoses: L 4 - 5 spondylolisthesis and stenosis  Discharge Diagnoses: Same Active Problems:  * No active hospital problems. *    Discharged Condition: good  Hospital Course: Uncomplicated decompression and fusion L 4 - 5  Consults: None  Significant Diagnostic Studies: None  Treatments: surgery: Uncomplicated decompression and fusion L 4 - 5  Discharge Exam: Blood pressure 109/71, pulse 83, temperature 98.8 F (37.1 C), temperature source Oral, resp. rate 18, height 5' 5" (1.651 m), weight 54.7 kg (120 lb 9.5 oz), SpO2 98.00%. Neurologic: Alert and oriented X 3, normal strength and tone. Normal symmetric reflexes. Normal coordination and gait Wound:CDI  Disposition: Home   Medication List  As of 03/31/2012 12:21 PM   ASK your doctor about these medications         atenolol-chlorthalidone 50-25 MG per tablet   Commonly known as: TENORETIC   Take 0.5 tablets by mouth daily.      KLOR-CON M20 20 MEQ tablet   Generic drug: potassium chloride SA   Take 20 mEq by mouth. Half tab      latanoprost 0.005 % ophthalmic solution   Commonly known as: XALATAN   Place 1 drop into both eyes at bedtime.             Signed: Diante Barley D, MD 03/31/2012, 12:21 PM  

## 2012-04-18 ENCOUNTER — Other Ambulatory Visit: Payer: Self-pay | Admitting: Family Medicine

## 2012-04-18 DIAGNOSIS — Z1231 Encounter for screening mammogram for malignant neoplasm of breast: Secondary | ICD-10-CM

## 2012-05-15 ENCOUNTER — Ambulatory Visit
Admission: RE | Admit: 2012-05-15 | Discharge: 2012-05-15 | Disposition: A | Discharge status: Home / Self Care | Payer: Medicare Other | Source: Ambulatory Visit | Attending: Family Medicine | Admitting: Family Medicine

## 2012-05-15 DIAGNOSIS — Z1231 Encounter for screening mammogram for malignant neoplasm of breast: Secondary | ICD-10-CM

## 2012-08-28 ENCOUNTER — Other Ambulatory Visit (INDEPENDENT_AMBULATORY_CARE_PROVIDER_SITE_OTHER): Payer: Medicare Other

## 2012-08-28 DIAGNOSIS — I10 Essential (primary) hypertension: Secondary | ICD-10-CM

## 2012-08-28 DIAGNOSIS — Z79899 Other long term (current) drug therapy: Secondary | ICD-10-CM

## 2012-08-28 DIAGNOSIS — Z0000 Encounter for general adult medical examination without abnormal findings: Secondary | ICD-10-CM

## 2012-08-28 LAB — HEPATIC FUNCTION PANEL
Albumin: 4.1 g/dL (ref 3.5–5.2)
Alkaline Phosphatase: 70 U/L (ref 39–117)
Total Bilirubin: 1 mg/dL (ref 0.3–1.2)

## 2012-08-28 LAB — POCT URINALYSIS DIPSTICK
Glucose, UA: NEGATIVE
Nitrite, UA: NEGATIVE
Spec Grav, UA: 1.02
Urobilinogen, UA: 1

## 2012-08-28 LAB — TSH: TSH: 2.08 u[IU]/mL (ref 0.35–5.50)

## 2012-08-28 LAB — CBC WITH DIFFERENTIAL/PLATELET
Basophils Absolute: 0.1 10*3/uL (ref 0.0–0.1)
Eosinophils Absolute: 0.4 10*3/uL (ref 0.0–0.7)
Hemoglobin: 15.9 g/dL — ABNORMAL HIGH (ref 12.0–15.0)
Lymphocytes Relative: 26.1 % (ref 12.0–46.0)
MCHC: 32.9 g/dL (ref 30.0–36.0)
Neutro Abs: 3.6 10*3/uL (ref 1.4–7.7)
Platelets: 176 10*3/uL (ref 150.0–400.0)
RDW: 14.2 % (ref 11.5–14.6)

## 2012-08-28 LAB — BASIC METABOLIC PANEL
BUN: 16 mg/dL (ref 6–23)
CO2: 30 mEq/L (ref 19–32)
Calcium: 9.6 mg/dL (ref 8.4–10.5)
Creatinine, Ser: 0.8 mg/dL (ref 0.4–1.2)
Glucose, Bld: 100 mg/dL — ABNORMAL HIGH (ref 70–99)
Sodium: 142 mEq/L (ref 135–145)

## 2012-08-28 LAB — LIPID PANEL
Cholesterol: 182 mg/dL (ref 0–200)
Triglycerides: 114 mg/dL (ref 0.0–149.0)

## 2012-08-30 ENCOUNTER — Encounter: Payer: Self-pay | Admitting: Internal Medicine

## 2012-09-04 ENCOUNTER — Encounter: Payer: Self-pay | Admitting: Family Medicine

## 2012-09-04 ENCOUNTER — Ambulatory Visit (INDEPENDENT_AMBULATORY_CARE_PROVIDER_SITE_OTHER): Payer: Medicare Other | Admitting: Family Medicine

## 2012-09-04 VITALS — BP 110/80 | Ht 64.75 in | Wt 122.0 lb

## 2012-09-04 DIAGNOSIS — Z0000 Encounter for general adult medical examination without abnormal findings: Secondary | ICD-10-CM

## 2012-09-04 DIAGNOSIS — M1990 Unspecified osteoarthritis, unspecified site: Secondary | ICD-10-CM

## 2012-09-04 DIAGNOSIS — F17200 Nicotine dependence, unspecified, uncomplicated: Secondary | ICD-10-CM

## 2012-09-04 DIAGNOSIS — Z23 Encounter for immunization: Secondary | ICD-10-CM

## 2012-09-04 DIAGNOSIS — M5136 Other intervertebral disc degeneration, lumbar region: Secondary | ICD-10-CM

## 2012-09-04 DIAGNOSIS — I10 Essential (primary) hypertension: Secondary | ICD-10-CM

## 2012-09-04 DIAGNOSIS — M810 Age-related osteoporosis without current pathological fracture: Secondary | ICD-10-CM

## 2012-09-04 DIAGNOSIS — M353 Polymyalgia rheumatica: Secondary | ICD-10-CM

## 2012-09-04 MED ORDER — ATENOLOL 12.5 MG HALF TABLET: ORAL_TABLET | ORAL | Status: DC

## 2012-09-04 MED ORDER — VARENICLINE TARTRATE 0.5 MG PO TABS: ORAL_TABLET | ORAL | Status: DC

## 2012-09-04 NOTE — Progress Notes (Signed)
  Subjective:    Patient ID: Shirley [name], female    DOB: Jan 21, 1934, 78 y.o.   MRN: [id number]  HPI Is a 78 year old married female smoker about 7 cigarettes a day who comes in today for a Medicare wellness examination because of a history of hypertension, tobacco abuse, osteoarthritis, osteoporosis, glucose intolerance and a history of PMR  Her medications reviewed she's taking one half tablet of Tenoretic daily BP too low 110/80 will decrease dose and stop potassium  We outlined a smoking cessation program with the Chantix program. She will begin the Chantix again. She states she tried it once and he gave her bad dreams we'll cut the dose back  She gets routine eye care, dental care, does not check her breasts monthly, BSE was taught, and you mammography, colonoscopy and GI, tetanus 2005, Pneumovax x2, shingles 2009, seasonal flu shot today. She also has some mild psoriasis on her coccyx and in posterior scalp   Review of Systems  Constitutional: Negative.   HENT: Negative.   Eyes: Negative.   Respiratory: Negative.   Cardiovascular: Negative.   Gastrointestinal: Negative.   Genitourinary: Negative.   Musculoskeletal: Negative.   Neurological: Negative.   Hematological: Negative.   Psychiatric/Behavioral: Negative.        Objective:   Physical Exam  Constitutional: She appears well-developed and well-nourished.  HENT:  Head: Normocephalic and atraumatic.  Right Ear: External ear normal.  Left Ear: External ear normal.  Nose: Nose normal.  Mouth/Throat: Oropharynx is clear and moist.  Eyes: EOM are normal. Pupils are equal, round, and reactive to light.  Neck: Normal range of motion. Neck supple. No thyromegaly present.  Cardiovascular: Normal rate, regular rhythm, normal heart sounds and intact distal pulses.  Exam reveals no gallop and no friction rub.   No murmur heard. Pulmonary/Chest: Effort normal and breath sounds normal.  Abdominal: Soft. Bowel sounds are normal.  She exhibits no distension and no mass. There is no tenderness. There is no rebound.  Genitourinary: Vagina normal. Guaiac negative stool. No vaginal discharge found.       Bilateral breast exam normal BSE was taught  Musculoskeletal: Normal range of motion.  Lymphadenopathy:    She has no cervical adenopathy.  Neurological: She is alert. She has normal reflexes. No cranial nerve deficit. She exhibits normal muscle tone. Coordination normal.  Skin: Skin is warm and dry.       Total body skin exam normal except from some psoriasis on her coccyx area.  Psychiatric: She has a normal mood and affect. Her behavior is normal. Judgment and thought content normal.   no carotid bruits aorta normal pedal pulses 2+ out of 4       Assessment & Plan:  Hypertension BP too low decrease beta blocker followup in one month  Tobacco abuse restart the Chantix program followup in one month  Osteoporosis and osteoarthritis continue diet and exercise  Recent history of lumbar disc disease daily exercise and walking.  Mild psoriasis over-the-counter steroid cream

## 2012-09-04 NOTE — Patient Instructions (Signed)
Decrease the beta blocker to 12.5 mg daily  Stop the potassium supplement  Restart the Chantix by taking a half a tablet Monday Wednesday Friday  Followup in 4 weeks for your blood pressure and the smoking cessation program

## 2012-10-02 ENCOUNTER — Ambulatory Visit (INDEPENDENT_AMBULATORY_CARE_PROVIDER_SITE_OTHER): Payer: Medicare Other | Admitting: Family Medicine

## 2012-10-02 ENCOUNTER — Encounter: Payer: Self-pay | Admitting: Family Medicine

## 2012-10-02 VITALS — BP 124/80 | Temp 97.6°F | Wt 122.0 lb

## 2012-10-02 DIAGNOSIS — I10 Essential (primary) hypertension: Secondary | ICD-10-CM

## 2012-10-02 DIAGNOSIS — F17200 Nicotine dependence, unspecified, uncomplicated: Secondary | ICD-10-CM

## 2012-10-02 NOTE — Patient Instructions (Addendum)
Continue the Tenormin 12.5 mg,,,,,,,,,,,,, one tablet daily  Increase the Chantix take one half tablet daily and begin tapering by deep decreasing her cigarette consumption by 1 per week,,,,,,,,,,,,6,,,,5,,etc  Followup in 4 weeks

## 2012-10-02 NOTE — Progress Notes (Signed)
  Subjective:    Patient ID: Shirley [name], female    DOB: 06-11-1934, 78 y.o.   MRN: [id number]  HPI Shirley [name] is a 78 year old female married smoker who comes in today for evaluation of hypertension and tobacco abuse  She was on Tenoretic 50-25 one half tab daily however her blood pressure was too low. They therefore decreased her medication to Tenormin 12.5 mg daily BP now 124/80 and she has no more episodes of lightheadedness with change in position  She's on Chantix 0.5 mg dose one half tab Monday Wednesday Friday and continues to smoke 7 cigarettes a day. We have outlined a tapering program to stop smoking completely    Review of Systems    general review of systems otherwise negative Objective:   Physical Exam Well-developed well-nourished female no acute distress BP right arm sitting position 124/80 pulse 70 and regular       Assessment & Plan:  Hypertension at goal continue Tenormin 12.5 mg daily  Tobacco abuse Chantix program one half tab daily taper by one cigarette per week followup in 4 weeks

## 2012-11-05 ENCOUNTER — Ambulatory Visit: Payer: Medicare Other | Admitting: Family Medicine

## 2012-12-18 ENCOUNTER — Telehealth: Payer: Self-pay | Admitting: Family Medicine

## 2012-12-18 DIAGNOSIS — M5136 Other intervertebral disc degeneration, lumbar region: Secondary | ICD-10-CM

## 2012-12-18 DIAGNOSIS — IMO0001 Reserved for inherently not codable concepts without codable children: Secondary | ICD-10-CM

## 2012-12-18 DIAGNOSIS — M353 Polymyalgia rheumatica: Secondary | ICD-10-CM

## 2012-12-18 DIAGNOSIS — M1990 Unspecified osteoarthritis, unspecified site: Secondary | ICD-10-CM

## 2012-12-18 NOTE — Telephone Encounter (Signed)
Patient called stating that the MD suggested that she see a neurologist and she would like for someone to go forth with setting this up. Please assist.

## 2012-12-18 NOTE — Telephone Encounter (Signed)
Referral request sent 

## 2013-01-24 ENCOUNTER — Other Ambulatory Visit: Payer: Self-pay | Admitting: Neurology

## 2013-01-24 DIAGNOSIS — R413 Other amnesia: Secondary | ICD-10-CM

## 2013-01-24 DIAGNOSIS — R6889 Other general symptoms and signs: Secondary | ICD-10-CM

## 2013-01-24 DIAGNOSIS — D518 Other vitamin B12 deficiency anemias: Secondary | ICD-10-CM

## 2013-01-30 ENCOUNTER — Ambulatory Visit
Admission: RE | Admit: 2013-01-30 | Discharge: 2013-01-30 | Disposition: A | Discharge status: Home / Self Care | Payer: Medicare Other | Source: Ambulatory Visit | Attending: Neurology | Admitting: Neurology

## 2013-01-30 DIAGNOSIS — R413 Other amnesia: Secondary | ICD-10-CM

## 2013-01-30 DIAGNOSIS — R6889 Other general symptoms and signs: Secondary | ICD-10-CM

## 2013-01-30 DIAGNOSIS — D518 Other vitamin B12 deficiency anemias: Secondary | ICD-10-CM

## 2013-02-04 ENCOUNTER — Emergency Department (HOSPITAL_COMMUNITY)
Admission: EM | Admit: 2013-02-04 | Discharge: 2013-02-04 | Disposition: A | Discharge status: Home / Self Care | Payer: Medicare Other | Attending: Emergency Medicine | Admitting: Emergency Medicine

## 2013-02-04 ENCOUNTER — Emergency Department (HOSPITAL_COMMUNITY): Payer: Medicare Other

## 2013-02-04 ENCOUNTER — Encounter (HOSPITAL_COMMUNITY): Payer: Self-pay | Admitting: Emergency Medicine

## 2013-02-04 DIAGNOSIS — S32000A Wedge compression fracture of unspecified lumbar vertebra, initial encounter for closed fracture: Secondary | ICD-10-CM

## 2013-02-04 DIAGNOSIS — M353 Polymyalgia rheumatica: Secondary | ICD-10-CM | POA: Insufficient documentation

## 2013-02-04 DIAGNOSIS — S32309A Unspecified fracture of unspecified ilium, initial encounter for closed fracture: Secondary | ICD-10-CM | POA: Insufficient documentation

## 2013-02-04 DIAGNOSIS — F17200 Nicotine dependence, unspecified, uncomplicated: Secondary | ICD-10-CM | POA: Insufficient documentation

## 2013-02-04 DIAGNOSIS — I10 Essential (primary) hypertension: Secondary | ICD-10-CM | POA: Insufficient documentation

## 2013-02-04 DIAGNOSIS — M1990 Unspecified osteoarthritis, unspecified site: Secondary | ICD-10-CM | POA: Insufficient documentation

## 2013-02-04 DIAGNOSIS — M810 Age-related osteoporosis without current pathological fracture: Secondary | ICD-10-CM | POA: Insufficient documentation

## 2013-02-04 DIAGNOSIS — Z79899 Other long term (current) drug therapy: Secondary | ICD-10-CM | POA: Insufficient documentation

## 2013-02-04 DIAGNOSIS — S0990XA Unspecified injury of head, initial encounter: Secondary | ICD-10-CM | POA: Insufficient documentation

## 2013-02-04 DIAGNOSIS — Y929 Unspecified place or not applicable: Secondary | ICD-10-CM | POA: Insufficient documentation

## 2013-02-04 DIAGNOSIS — Z8739 Personal history of other diseases of the musculoskeletal system and connective tissue: Secondary | ICD-10-CM | POA: Insufficient documentation

## 2013-02-04 DIAGNOSIS — Y9389 Activity, other specified: Secondary | ICD-10-CM | POA: Insufficient documentation

## 2013-02-04 DIAGNOSIS — S32009A Unspecified fracture of unspecified lumbar vertebra, initial encounter for closed fracture: Secondary | ICD-10-CM | POA: Insufficient documentation

## 2013-02-04 DIAGNOSIS — W108XXA Fall (on) (from) other stairs and steps, initial encounter: Secondary | ICD-10-CM | POA: Insufficient documentation

## 2013-02-04 DIAGNOSIS — S32302A Unspecified fracture of left ilium, initial encounter for closed fracture: Secondary | ICD-10-CM

## 2013-02-04 DIAGNOSIS — W19XXXA Unspecified fall, initial encounter: Secondary | ICD-10-CM

## 2013-02-04 DIAGNOSIS — E739 Lactose intolerance, unspecified: Secondary | ICD-10-CM | POA: Insufficient documentation

## 2013-02-04 MED ORDER — HYDROCODONE-ACETAMINOPHEN 5-325 MG PO TABS: 2.0000 | ORAL_TABLET | ORAL | Status: DC | PRN

## 2013-02-04 MED ORDER — HYDROCODONE-ACETAMINOPHEN 5-325 MG PO TABS
2.0000 | ORAL_TABLET | Freq: Once | ORAL | Status: AC
  Administered 2013-02-04: 2 via ORAL
  Filled 2013-02-04: qty 2

## 2013-02-04 NOTE — Progress Notes (Signed)
Orthopedic Tech Progress Note Patient Details:  Shirley [name] [birth year] [id number] Brace order completed by [employer] vendor Trey Paula 5:45 pm. Patient ID: Shirley [name], female   DOB: 1934/04/17, 79 y.o.   MRN: [id number]   Shirley [name] 02/04/2013, 6:49 PM

## 2013-02-04 NOTE — ED Provider Notes (Signed)
History     CSN: [id number]  Arrival date & time 02/04/13  1212   First MD Initiated Contact with Patient 02/04/13 1332      Chief Complaint  Patient presents with  . Hip Pain  . Fall    (Consider location/radiation/quality/duration/timing/severity/associated sxs/prior treatment) HPI Comments: Patient presents with left hip and low back pain for the past 4 days. States she was walking down the steps on Friday night missed a step and fell onto her left side down 4 steps. States she hit her head but did not black out. Denies any headache, neck pain, upper back, chest or abdominal pain. She's had pain in her right shoulder which is worsening of her chronic pain after replacement. Been taking some Norco without relief. Denies any bowel or bladder incontinence, fever, chills or vomiting. She's able to walk with minimal assistance.  The history is provided by the patient.    Past Medical History  Diagnosis Date  . Hypertension   . Osteoporosis   . PMR (polymyalgia rheumatica)   . Tobacco abuse   . Glucose intolerance (impaired glucose tolerance)   . Osteoarthritis   . Arthritis     "right shoulder; that's why I had to have it replaced"    Past Surgical History  Procedure Laterality Date  . Cataract extraction w/ intraocular lens  implant, bilateral      2008   . Posterior fusion lumbar spine  03/28/12  . Total shoulder replacement  06/2011    right  . Shoulder arthroscopy w/ rotator cuff repair  ~ 01/2011    right  . Vaginal hysterectomy  1970's    History reviewed. No pertinent family history.  History  Substance Use Topics  . Smoking status: Current Every Day Smoker -- 0.50 packs/day for 40 years    Types: Cigarettes    Last Attempt to Quit: 12/06/1991  . Smokeless tobacco: Never Used  . Alcohol Use: 8.4 oz/week    14 Glasses of wine per week    OB History   Grav Para Term Preterm Abortions TAB SAB Ect Mult Living   3               Review of Systems   Constitutional: Positive for activity change and appetite change. Negative for fever.  HENT: Negative for congestion and rhinorrhea.   Respiratory: Negative for cough, chest tightness and shortness of breath.   Gastrointestinal: Negative for nausea, vomiting and abdominal pain.  Genitourinary: Positive for vaginal discharge. Negative for dysuria, hematuria and vaginal bleeding.  Musculoskeletal: Positive for myalgias, back pain and arthralgias.  Skin: Negative for wound.  Neurological: Negative for dizziness and headaches.  A complete 10 system review of systems was obtained and all systems are negative except as noted in the HPI and PMH.    Allergies  Review of patient's allergies indicates no known allergies.  Home Medications   Current Outpatient Rx  Name  Route  Sig  Dispense  Refill  . atenolol (TENORMIN) 12.5 mg TABS   Oral   Take 12.5 mg by mouth daily. 1 by mouth every morning         . latanoprost (XALATAN) 0.005 % ophthalmic solution   Both Eyes   Place 1 drop into both eyes at bedtime.            BP 164/93  Pulse 87  Temp(Src) 98 F (36.7 C) (Oral)  Resp 16  SpO2 99%  Physical Exam  Constitutional: She is oriented to  person, place, and time. She appears well-developed and well-nourished. No distress.  HENT:  Head: Normocephalic and atraumatic.  Mouth/Throat: Oropharynx is clear and moist. No oropharyngeal exudate.  Eyes: Conjunctivae and EOM are normal. Pupils are equal, round, and reactive to light.  Neck: Normal range of motion. Neck supple.  Cardiovascular: Normal rate, regular rhythm and normal heart sounds.   No murmur heard. Pulmonary/Chest: Effort normal and breath sounds normal. No respiratory distress.  Abdominal: Soft. There is no tenderness. There is no rebound and no guarding.  Musculoskeletal: Normal range of motion. She exhibits tenderness. She exhibits no edema.  Tender to palpation left iliac crest. No bruising or deformity. No  shortening. No pain with log rolling of the left leg. Full range of motion of left hip without pain. No step offs L-spine. Intact DP and PT pulses  Neurological: She is alert and oriented to person, place, and time. No cranial nerve deficit. She exhibits normal muscle tone. Coordination normal.  Skin: Skin is warm.    ED Course  Procedures (including critical care time)  Labs Reviewed - No data to display Dg Lumbar Spine Complete  02/04/2013  *RADIOLOGY REPORT*  Clinical Data: Fall.  Low back pain.  LUMBAR SPINE - COMPLETE 4+ VIEW  Comparison: Plain films lumbar spine 07/24/2012 and 10/15/2012.  Findings: The patient has a new L2 superior endplate compression fracture with approximately 50% vertebral body height loss.  There appears to be mild bony retropulsion.  Again seen is postoperative change of L4-5 laminectomy and fusion.  Hardware is intact.  There is unchanged 0.7 cm of anterolisthesis of L5 on S1 due to advanced facet degenerative change.  IMPRESSION:  1.  Superior endplate compression fracture of L2 is new since the prior studies and appears acute. 2.  Status post L4-5 fusion. 3.  No change in 0.7 cm anterolisthesis of L5 on S1 due to facet arthropathy.   Original Report Authenticated By: Holley Dexter, M.D.    Dg Shoulder Right  02/04/2013  *RADIOLOGY REPORT*  Clinical Data: Fall, pain.  RIGHT SHOULDER - 2+ VIEW  Comparison: None.  Findings: The patient is status post reverse shoulder arthroplasty. The device is located.  Hardware is intact.  Right lung appears clear.  IMPRESSION: No acute finding.  Left shoulder replacement without evidence of complication.   Original Report Authenticated By: Holley Dexter, M.D.    Dg Hip Complete Left  02/04/2013  *RADIOLOGY REPORT*  Clinical Data: Fall.  Left hip pain.  LEFT HIP - COMPLETE 2+ VIEW  Comparison: None.  Findings: There is no fracture or dislocation.  No notable degenerative change is present about the hips.  No evidence of avascular  necrosis.  The patient is status post lower lumbar fusion.  IMPRESSION: No acute or focal abnormality.   Original Report Authenticated By: Holley Dexter, M.D.    Ct Head Wo Contrast  02/04/2013  *RADIOLOGY REPORT*  Clinical Data: Fall 3 days ago.  Pain.  CT HEAD WITHOUT CONTRAST  Technique:  Contiguous axial images were obtained from the base of the skull through the vertex without contrast.  Comparison: Brain MRI 01/30/2013.  Findings: There is no evidence of acute intracranial abnormality including infarct, hemorrhage, mass lesion, mass effect, midline shift or abnormal extra-axial fluid collection.  There is no hydrocephalus or pneumocephalus.  The calvarium is intact.  IMPRESSION: Negative exam.   Original Report Authenticated By: Holley Dexter, M.D.    Ct Lumbar Spine Wo Contrast  02/04/2013  *RADIOLOGY REPORT*  Clinical Data:  79 year old female status post fall down stairs with pain radiating to the left posterior hip. Abnormal lumbar radiographs earlier the same day.  CT LUMBAR SPINE WITHOUT CONTRAST  Technique:  Multidetector CT imaging of the lumbar spine was performed without intravenous contrast administration. Multiplanar CT image reconstructions were also generated.  Comparison: Lumbar radiographs the same day and earlier.  Lumbar MRI 01/31/2012.  Findings: Negative visualized costophrenic angles.  Intermittent calcified atherosclerosis of the aorta and iliac arteries.  Mild bilateral paraspinal stranding at the L2 level compatible with post traumatic edema or small volume hematoma.  No acute posterior paraspinal soft tissue process.  The L2 vertebral body fracture has a burst type configuration with severe comminution and depression of the superior endplate. Posterior superior endplate fragment retropulsed up to 7 mm. Subsequent narrowing of the thecal sac to 7 mm AP (versus 14 mm at the unaffected L1 inferior endplate level).  The pedicles and posterior elements of L2 are intact.  There is  probably a small volume of ventral epidural hemorrhage (the sagittal image 30).  There is a focal defect of the L1 superior endplate paracentrally on the right.  No Schmorl's node at the level into 1013, and small fragments suggest this is an acute fracture.  Mild loss of L1 vertebral body height overall.  No retropulsion of bone or posterior element involvement.  T12 and the other lumbar levels appear intact.  Since the prior MRI L4-L5 posterior and interbody fusion plus decompression have occurred.  Hardware appears intact.  There is early interbody arthrodesis suspected (see series 402).  The visualized sacrum and SI joints are intact.  Chronic lumbar disc and facet degeneration has mildly progressed since 2013.  Multifactorial moderate degenerative spinal stenosis now at L3-L4, but improved thecal sac patency at L4-L5 the following surgery.  IMPRESSION: 1.  Acute L2 vertebral body fracture has a burst fracture configuration. Small volume associated epidural and paraspinal hematoma.  Retropulsion of the L2 posterior-superior endplate up to 7 mm resulting in mild to moderate L2 spinal stenosis.  No posterior element involvement. 2.  Mild L1 superior endplate compression fracture.  No retropulsion of bone or complicating features.  3.  L4-L5 decompression and fusion with improved patency of the L4- L5 thecal sac and 01/31/2012 and early interbody arthrodesis suspected. 4.  Adjacent segment disease at L3-L4 with increased multifactorial moderate degenerative spinal stenosis.  #1 and #2 discussed by telephone with Dr. Glynn Octave on 02/04/2013 at 1517 hours.   Original Report Authenticated By: Erskine Speed, M.D.    Ct Pelvis Wo Contrast  02/04/2013  *RADIOLOGY REPORT*  Clinical Data: Status post fall.  Left hip pain.  CT PELVIS WITHOUT CONTRAST  Technique:  Multidetector CT imaging of the pelvis was performed following the standard protocol without intravenous contrast.  Comparison: Plain films left hip earlier  this same date.  Findings: Both hips are located.  There is no hip fracture.  The patient has a nondisplaced fracture of the left iliac wing just lateral to the SI joint.  No other fracture is identified.  There is no hip joint effusion.  Imaged intrapelvic contents are unremarkable.  Postoperative change of L4-5 fusion is partially visualized.  0.7 cm of anterolisthesis of L5 on S1 due to facet arthropathy is noted.  IMPRESSION:  1.  Acute, nondisplaced posterior left iliac wing fracture. 2.  Negative for hip fracture. 3.  Facet degenerative disease lower lumbar spine.   Original Report Authenticated By: Holley Dexter, M.D.  No diagnosis found.    MDM  Mechanical fall with low back pain and L hip pain. No weakness, numbness, tingling.  Did hit head, no LOC.  Patient has equal strength in her bilateral extremities. There is no pain with leg rolling. There is full range of motion of her left hip. No fracture of hip. There is a fracture of the left iliac wing is nondisplaced. L2 burst fracture and L1 compression fracture discussed with Dr. Danielle Dess of neurosurgery. He reviewed the images. Patient has been walking with her injuries x 3 days. He is not planned surgery at this point. He recommends TLSO brace and bed rest. Patient has seen Dr. Marlyne Beards in the past for her previous surgery on may go back to him.  Her pain is well-controlled. Her iliac wing fractures nonoperative. Discussed with Dr. Thomasena Edis for Dr. supple. He recommends walker with minimal weightbearing. And followup with repeat x-ray in one week.   Patient offered admission for pain control which she declined. She wants to go home. She states she has enough help at home and does not want to come into the hospital. Family is comfortable taking care of her at home. Will need followup with Dr. Rennis Chris and Dr. Danielle Dess next week.       Glynn Octave, MD 02/04/13 [phone]

## 2013-02-04 NOTE — ED Notes (Signed)
Return from RAD

## 2013-02-04 NOTE — ED Notes (Signed)
Pt sts left lower back and hip pain after fall on Friday; pt sts weight bearing with pain; pt sts chronic right shoulder pain after fall due to using shoulder more for compensation

## 2013-02-04 NOTE — ED Notes (Signed)
Pt waiting for TLSO brace and Ortho called to come apply it.

## 2013-02-04 NOTE — Progress Notes (Signed)
Orthopedic Tech Progress Note Patient Details:  Shirley [name] [birth year] [id number]  Patient ID: [name], female   DOB: [birth year], 79 y.o.   MRN: [id number]   [name] 02/04/2013, 4:33 PM CALLED [facility] FOR TLSO.

## 2013-02-04 NOTE — ED Notes (Signed)
Patient transported to X-ray 

## 2013-02-25 ENCOUNTER — Other Ambulatory Visit: Payer: Self-pay | Admitting: Neurosurgery

## 2013-02-25 ENCOUNTER — Encounter (HOSPITAL_COMMUNITY): Payer: Self-pay | Admitting: Pharmacy Technician

## 2013-02-26 ENCOUNTER — Encounter (HOSPITAL_COMMUNITY)
Admission: RE | Admit: 2013-02-26 | Discharge: 2013-02-26 | Disposition: A | Discharge status: Home / Self Care | Payer: Medicare Other | Source: Ambulatory Visit | Attending: Neurosurgery | Admitting: Neurosurgery

## 2013-02-26 ENCOUNTER — Encounter (HOSPITAL_COMMUNITY): Admission: RE | Admit: 2013-02-26 | Payer: Medicare Other | Source: Ambulatory Visit

## 2013-02-26 LAB — CBC
HCT: 44.4 % (ref 36.0–46.0)
Hemoglobin: 15.3 g/dL — ABNORMAL HIGH (ref 12.0–15.0)
MCH: 33 pg (ref 26.0–34.0)
MCHC: 34.5 g/dL (ref 30.0–36.0)
RDW: 12.3 % (ref 11.5–15.5)

## 2013-02-26 LAB — BASIC METABOLIC PANEL
BUN: 14 mg/dL (ref 6–23)
Chloride: 99 mEq/L (ref 96–112)
Creatinine, Ser: 0.57 mg/dL (ref 0.50–1.10)
GFR calc Af Amer: >90 mL/min (ref >90)
GFR calc non Af Amer: 88 mL/min — ABNORMAL LOW (ref >90)
Glucose, Bld: 99 mg/dL (ref 70–99)
Potassium: 4.1 mEq/L (ref 3.5–5.1)

## 2013-02-26 LAB — SURGICAL PCR SCREEN: Staphylococcus aureus: NEGATIVE

## 2013-02-26 NOTE — Progress Notes (Signed)
Chest xray DOS pt refused to remove brace.

## 2013-02-26 NOTE — Pre-Procedure Instructions (Signed)
Shirley [name]  02/26/2013   Your procedure is scheduled on:  February 28, 2013  Report to [facility] at 12 PM.  Call this number if you have problems the morning of surgery: [phone]   Remember:   Do not eat food or drink liquids after midnight.   Take these medicines the morning of surgery with A SIP OF WATER: atenolol(tenormin)   Do not wear jewelry, make-up or nail polish.  Do not wear lotions, powders, or perfumes. You may wear deodorant.  Do not shave 48 hours prior to surgery. Men may shave face and neck.  Do not bring valuables to the hospital.  Contacts, dentures or bridgework may not be worn into surgery.  Leave suitcase in the car. After surgery it may be brought to your room.  For patients admitted to the hospital, checkout time is 11:00 AM the day of  discharge.   Patients discharged the day of surgery will not be allowed to drive  home.  Name and phone number of your driver: family/friend  Special Instructions: Shower using CHG 2 nights before surgery and the night before surgery.  If you shower the day of surgery use CHG.  Use special wash - you have one bottle of CHG for all showers.  You should use approximately 1/3 of the bottle for each shower.   Please read over the following fact sheets that you were given: Pain Booklet, Coughing and Deep Breathing and Surgical Site Infection Prevention

## 2013-02-27 MED ORDER — CEFAZOLIN SODIUM-DEXTROSE 2-3 GM-% IV SOLR
2.0000 g | INTRAVENOUS | Status: AC
  Administered 2013-02-28: 2 g via INTRAVENOUS
  Filled 2013-02-27: qty 50

## 2013-02-28 ENCOUNTER — Encounter (HOSPITAL_COMMUNITY): Payer: Self-pay | Admitting: *Deleted

## 2013-02-28 ENCOUNTER — Encounter (HOSPITAL_COMMUNITY)
Admission: RE | Disposition: A | Discharge status: Home / Self Care | Payer: Self-pay | Source: Ambulatory Visit | Attending: Neurosurgery

## 2013-02-28 ENCOUNTER — Inpatient Hospital Stay (HOSPITAL_COMMUNITY): Payer: Medicare Other

## 2013-02-28 ENCOUNTER — Inpatient Hospital Stay (HOSPITAL_COMMUNITY)
Admission: RE | Admit: 2013-02-28 | Discharge: 2013-03-01 | DRG: 479 | Disposition: A | Discharge status: Home / Self Care | Payer: Medicare Other | Source: Ambulatory Visit | Attending: Neurosurgery | Admitting: Neurosurgery

## 2013-02-28 ENCOUNTER — Encounter (HOSPITAL_COMMUNITY): Payer: Self-pay | Admitting: Anesthesiology

## 2013-02-28 ENCOUNTER — Inpatient Hospital Stay (HOSPITAL_COMMUNITY): Payer: Medicare Other | Admitting: Anesthesiology

## 2013-02-28 DIAGNOSIS — Z01812 Encounter for preprocedural laboratory examination: Secondary | ICD-10-CM

## 2013-02-28 DIAGNOSIS — I10 Essential (primary) hypertension: Secondary | ICD-10-CM | POA: Diagnosis present

## 2013-02-28 DIAGNOSIS — W19XXXA Unspecified fall, initial encounter: Secondary | ICD-10-CM | POA: Diagnosis present

## 2013-02-28 DIAGNOSIS — F17200 Nicotine dependence, unspecified, uncomplicated: Secondary | ICD-10-CM | POA: Diagnosis present

## 2013-02-28 DIAGNOSIS — S32009A Unspecified fracture of unspecified lumbar vertebra, initial encounter for closed fracture: Principal | ICD-10-CM | POA: Diagnosis present

## 2013-02-28 DIAGNOSIS — M1990 Unspecified osteoarthritis, unspecified site: Secondary | ICD-10-CM | POA: Diagnosis present

## 2013-02-28 DIAGNOSIS — Z79899 Other long term (current) drug therapy: Secondary | ICD-10-CM

## 2013-02-28 DIAGNOSIS — M353 Polymyalgia rheumatica: Secondary | ICD-10-CM | POA: Diagnosis present

## 2013-02-28 DIAGNOSIS — Z981 Arthrodesis status: Secondary | ICD-10-CM

## 2013-02-28 DIAGNOSIS — Z96619 Presence of unspecified artificial shoulder joint: Secondary | ICD-10-CM

## 2013-02-28 DIAGNOSIS — M810 Age-related osteoporosis without current pathological fracture: Secondary | ICD-10-CM | POA: Diagnosis present

## 2013-02-28 DIAGNOSIS — E739 Lactose intolerance, unspecified: Secondary | ICD-10-CM | POA: Diagnosis present

## 2013-02-28 HISTORY — PX: KYPHOPLASTY: SHX5884

## 2013-02-28 SURGERY — KYPHOPLASTY
Anesthesia: General | Site: Back | Wound class: Clean

## 2013-02-28 MED ORDER — MENTHOL 3 MG MT LOZG: 1.0000 | LOZENGE | OROMUCOSAL | Status: DC | PRN

## 2013-02-28 MED ORDER — ALUM & MAG HYDROXIDE-SIMETH 200-200-20 MG/5ML PO SUSP: 30.0000 mL | Freq: Four times a day (QID) | ORAL | Status: DC | PRN

## 2013-02-28 MED ORDER — ROCURONIUM BROMIDE 100 MG/10ML IV SOLN
INTRAVENOUS | Status: DC | PRN
Start: 2013-02-28 — End: 2013-02-28
  Administered 2013-02-28: 30 mg via INTRAVENOUS

## 2013-02-28 MED ORDER — ATENOLOL 12.5 MG HALF TABLET
12.5000 mg | ORAL_TABLET | Freq: Every day | ORAL | Status: DC
  Filled 2013-02-28 (×2): qty 1

## 2013-02-28 MED ORDER — CEFAZOLIN SODIUM-DEXTROSE 2-3 GM-% IV SOLR
INTRAVENOUS | Status: AC
  Filled 2013-02-28: qty 50

## 2013-02-28 MED ORDER — HYDROCODONE-ACETAMINOPHEN 5-325 MG PO TABS
1.0000 | ORAL_TABLET | ORAL | Status: DC | PRN
  Administered 2013-03-01: 1 via ORAL
  Filled 2013-02-28: qty 1

## 2013-02-28 MED ORDER — IOHEXOL 300 MG/ML  SOLN
INTRAMUSCULAR | Status: DC | PRN
  Administered 2013-02-28: 50 mL

## 2013-02-28 MED ORDER — CEFAZOLIN SODIUM-DEXTROSE 2-3 GM-% IV SOLR
2.0000 g | Freq: Three times a day (TID) | INTRAVENOUS | Status: AC
  Administered 2013-02-28 – 2013-03-01 (×2): 2 g via INTRAVENOUS
  Filled 2013-02-28 (×2): qty 50

## 2013-02-28 MED ORDER — ACETAMINOPHEN 325 MG PO TABS: 650.0000 mg | ORAL_TABLET | ORAL | Status: DC | PRN

## 2013-02-28 MED ORDER — HYDROMORPHONE HCL PF 1 MG/ML IJ SOLN: 0.2500 mg | INTRAMUSCULAR | Status: DC | PRN

## 2013-02-28 MED ORDER — LACTATED RINGERS IV SOLN
INTRAVENOUS | Status: DC | PRN
  Administered 2013-02-28 (×2): via INTRAVENOUS

## 2013-02-28 MED ORDER — 0.9 % SODIUM CHLORIDE (POUR BTL) OPTIME
TOPICAL | Status: DC | PRN
  Administered 2013-02-28: 1000 mL

## 2013-02-28 MED ORDER — PROPOFOL 10 MG/ML IV BOLUS
INTRAVENOUS | Status: DC | PRN
  Administered 2013-02-28: 150 mg via INTRAVENOUS

## 2013-02-28 MED ORDER — NEOSTIGMINE METHYLSULFATE 1 MG/ML IJ SOLN
INTRAMUSCULAR | Status: DC | PRN
  Administered 2013-02-28: 3 mg via INTRAVENOUS

## 2013-02-28 MED ORDER — LACTATED RINGERS IV SOLN: INTRAVENOUS | Status: DC

## 2013-02-28 MED ORDER — FENTANYL CITRATE 0.05 MG/ML IJ SOLN
INTRAMUSCULAR | Status: DC | PRN
  Administered 2013-02-28: 150 ug via INTRAVENOUS
  Administered 2013-02-28: 100 ug via INTRAVENOUS

## 2013-02-28 MED ORDER — MIDAZOLAM HCL 5 MG/5ML IJ SOLN
INTRAMUSCULAR | Status: DC | PRN
  Administered 2013-02-28: 1 mg via INTRAVENOUS

## 2013-02-28 MED ORDER — DOCUSATE SODIUM 100 MG PO CAPS
100.0000 mg | ORAL_CAPSULE | Freq: Two times a day (BID) | ORAL | Status: DC
  Administered 2013-02-28: 100 mg via ORAL
  Filled 2013-02-28: qty 1

## 2013-02-28 MED ORDER — MORPHINE SULFATE 2 MG/ML IJ SOLN: 1.0000 mg | INTRAMUSCULAR | Status: DC | PRN

## 2013-02-28 MED ORDER — LIDOCAINE HCL (CARDIAC) 20 MG/ML IV SOLN
INTRAVENOUS | Status: DC | PRN
  Administered 2013-02-28: 50 mg via INTRAVENOUS

## 2013-02-28 MED ORDER — ACETAMINOPHEN 650 MG RE SUPP: 650.0000 mg | RECTAL | Status: DC | PRN

## 2013-02-28 MED ORDER — GLYCOPYRROLATE 0.2 MG/ML IJ SOLN
INTRAMUSCULAR | Status: DC | PRN
  Administered 2013-02-28: .6 mg via INTRAVENOUS

## 2013-02-28 MED ORDER — OXYCODONE-ACETAMINOPHEN 5-325 MG PO TABS: 1.0000 | ORAL_TABLET | ORAL | Status: DC | PRN

## 2013-02-28 MED ORDER — DIAZEPAM 5 MG PO TABS: 5.0000 mg | ORAL_TABLET | Freq: Four times a day (QID) | ORAL | Status: DC | PRN

## 2013-02-28 MED ORDER — BUPIVACAINE-EPINEPHRINE 0.5% -1:200000 IJ SOLN
INTRAMUSCULAR | Status: DC | PRN
  Administered 2013-02-28: 10 mL

## 2013-02-28 MED ORDER — PHENOL 1.4 % MT LIQD: 1.0000 | OROMUCOSAL | Status: DC | PRN

## 2013-02-28 MED ORDER — ONDANSETRON HCL 4 MG/2ML IJ SOLN: 4.0000 mg | INTRAMUSCULAR | Status: DC | PRN

## 2013-02-28 SURGICAL SUPPLY — 39 items
APL SKNCLS STERI-STRIP NONHPOA (GAUZE/BANDAGES/DRESSINGS) ×1
BENZOIN TINCTURE PRP APPL 2/3 (GAUZE/BANDAGES/DRESSINGS) ×2 IMPLANT
BLADE SURG ROTATE 9660 (MISCELLANEOUS) IMPLANT
CEMENT AND MIXER ×1 IMPLANT
CEMENT BONE KYPHX HV R (Orthopedic Implant) ×1 IMPLANT
CLOTH BEACON ORANGE TIMEOUT ST (SAFETY) ×2 IMPLANT
CONT SPEC 4OZ CLIKSEAL STRL BL (MISCELLANEOUS) ×2 IMPLANT
DEVICE BIOPSY BONE KYPHX (INSTRUMENTS) ×1 IMPLANT
DRAPE C-ARM 42X72 X-RAY (DRAPES) ×2 IMPLANT
DRAPE INCISE IOBAN 66X45 STRL (DRAPES) ×2 IMPLANT
DRAPE LAPAROTOMY 100X72X124 (DRAPES) ×2 IMPLANT
DRAPE PROXIMA HALF (DRAPES) ×2 IMPLANT
DRAPE SURG 17X23 STRL (DRAPES) ×8 IMPLANT
GLOVE BIO SURGEON STRL SZ8.5 (GLOVE) ×2 IMPLANT
GLOVE BIOGEL PI IND STRL 7.0 (GLOVE) IMPLANT
GLOVE BIOGEL PI INDICATOR 7.0 (GLOVE) ×1
GLOVE EXAM NITRILE LRG STRL (GLOVE) IMPLANT
GLOVE EXAM NITRILE MD LF STRL (GLOVE) IMPLANT
GLOVE EXAM NITRILE XL STR (GLOVE) IMPLANT
GLOVE EXAM NITRILE XS STR PU (GLOVE) IMPLANT
GLOVE INDICATOR 7.0 STRL GRN (GLOVE) ×1 IMPLANT
GLOVE SS BIOGEL STRL SZ 8 (GLOVE) ×1 IMPLANT
GLOVE SUPERSENSE BIOGEL SZ 8 (GLOVE) ×1
GOWN BRE IMP SLV AUR LG STRL (GOWN DISPOSABLE) IMPLANT
GOWN BRE IMP SLV AUR XL STRL (GOWN DISPOSABLE) ×2 IMPLANT
KIT BASIN OR (CUSTOM PROCEDURE TRAY) ×2 IMPLANT
KIT ROOM TURNOVER OR (KITS) ×2 IMPLANT
NS IRRIG 1000ML POUR BTL (IV SOLUTION) ×2 IMPLANT
PACK EENT II TURBAN DRAPE (CUSTOM PROCEDURE TRAY) ×2 IMPLANT
PAD ARMBOARD 7.5X6 YLW CONV (MISCELLANEOUS) ×6 IMPLANT
SPECIMEN JAR SMALL (MISCELLANEOUS) IMPLANT
SPONGE GAUZE 4X4 12PLY (GAUZE/BANDAGES/DRESSINGS) ×2 IMPLANT
STRIP CLOSURE SKIN 1/2X4 (GAUZE/BANDAGES/DRESSINGS) ×2 IMPLANT
SUT VIC AB 3-0 SH 8-18 (SUTURE) ×2 IMPLANT
SYR CONTROL 10ML LL (SYRINGE) ×2 IMPLANT
TAPE CLOTH SURG 4X10 WHT LF (GAUZE/BANDAGES/DRESSINGS) ×1 IMPLANT
TOWEL OR 17X24 6PK STRL BLUE (TOWEL DISPOSABLE) ×2 IMPLANT
TOWEL OR 17X26 10 PK STRL BLUE (TOWEL DISPOSABLE) ×2 IMPLANT
TRAY KYPHOPAK 20/3 ONESTEP 1ST (MISCELLANEOUS) ×2 IMPLANT

## 2013-02-28 NOTE — Anesthesia Preprocedure Evaluation (Addendum)
Anesthesia Evaluation  Patient identified by MRN, date of birth, ID band Patient awake and Patient confused    Reviewed: Allergy & Precautions, NPO status , Patient's Chart, lab work & pertinent test results, reviewed documented beta blocker date and time   Airway Mallampati: II      Dental   Pulmonary asthma , Current Smoker,  breath sounds clear to auscultation        Cardiovascular Exercise Tolerance: Good hypertension, Pt. on home beta blockers Rhythm:Regular Rate:Normal     Neuro/Psych    GI/Hepatic negative GI ROS, Neg liver ROS,   Endo/Other  negative endocrine ROS  Renal/GU negative Renal ROS     Musculoskeletal  (+) Arthritis -, Osteoarthritis,    Abdominal   Peds  Hematology negative hematology ROS (+)   Anesthesia Other Findings   Reproductive/Obstetrics                         Anesthesia Physical Anesthesia Plan  ASA: III  Anesthesia Plan: General   Post-op Pain Management:    Induction: Intravenous  Airway Management Planned: Oral ETT  Additional Equipment:   Intra-op Plan:   Post-operative Plan: Extubation in OR  Informed Consent: I have reviewed the patients History and Physical, chart, labs and discussed the procedure including the risks, benefits and alternatives for the proposed anesthesia with the patient or authorized representative who has indicated his/her understanding and acceptance.   Dental advisory given  Plan Discussed with: CRNA, Anesthesiologist and Surgeon  Anesthesia Plan Comments:         Anesthesia Quick Evaluation

## 2013-02-28 NOTE — Op Note (Signed)
Brief history: The patient is a 79 year old white female who I performed an L4-5 decompression instrumentation and fusion on. She has done well but fell and suffered a L2 compression fracture. She has failed medical management and was worked up with a lumbar x-rays a CT scan. I discussed the various treatment options with the patient including surgery. The patient has weighed the risks, benefits, and alternatives surgery and decided proceed with a L2 kyphoplasty.  Preop diagnosis: L2 compression fracture, lumbago  Postop diagnosis: The same  Procedure: L2 kyphoplasty  Surgeon: Dr. Delma Officer  Assistant: None  Anesthesia: Gen. endotracheal   estimated blood loss: Minimal  Specimens: None  Drains: None  Complications: None  Description of procedure: The patient was brought to the operating room by the anesthesia team. General endotracheal anesthesia was induced. The patient was turned to the prone position on the chest rolls. The patient's thoracolumbar region was then prepared with Betadine scrub and Betadine solution. Sterile drapes were applied. I then injected the area to be incised with Marcaine with epinephrine solution. I made a small incision over the bilateral L2 pedicles. Under fluoroscopic guidance I cannulated the bilateral L2 pedicles with the Jamshidi needles. We then removed the inner cannula and obtained a vertebral biopsy from the right L2 pedicle. We then drilled down the Jamshidi needles. I then inserted the balloon was inflated the balloons and then deflated  balloons. We then removed the balloons and filled the cavity we created with the balloons with bone cement. This was all done under fluoroscopic guidance. We did not note any untoward effects. We then removed the cannula. I then reapproximated the patient's subcutaneous tissue with interrupted 3-0 Vicryl suture. We reapproximated the skin with Steri-Strips and benzoin. The wounds were then coated with bacitracin  ointment. Sterile dressings were applied. The drapes were removed. The patient was subsequently returned to the supine position. She was then extubated by the anesthesia team and transported to the post anesthesia care unit in stable condition. By report all sponge instrument and needle counts were correct at this case.

## 2013-02-28 NOTE — Anesthesia Procedure Notes (Signed)
Procedure Name: Intubation Date/Time: 02/28/2013 3:45 PM Performed by: Gwenyth Allegra Pre-anesthesia Checklist: Patient identified, Timeout performed, Emergency Drugs available, Suction available and Patient being monitored Patient Re-evaluated:Patient Re-evaluated prior to inductionOxygen Delivery Method: Circle system utilized Intubation Type: IV induction Ventilation: Mask ventilation without difficulty Laryngoscope Size: Mac and 3 Grade View: Grade II Tube type: Oral Tube size: 7.0 mm Airway Equipment and Method: Stylet Placement Confirmation: ETT inserted through vocal cords under direct vision,  breath sounds checked- equal and bilateral and positive ETCO2 Secured at: 22 cm Tube secured with: Tape Dental Injury: Teeth and Oropharynx as per pre-operative assessment

## 2013-02-28 NOTE — Anesthesia Postprocedure Evaluation (Signed)
  Anesthesia Post-op Note  Patient: Shirley [name]  Procedure(s) Performed: Procedure(s) with comments: KYPHOPLASTY (N/A) - Lumbar two Kyphoplasty  Patient Location: PACU  Anesthesia Type:General  Level of Consciousness: awake  Airway and Oxygen Therapy: Patient Spontanous Breathing  Post-op Pain: mild  Post-op Assessment: Post-op Vital signs reviewed  Post-op Vital Signs: Reviewed  Complications: No apparent anesthesia complications

## 2013-02-28 NOTE — Progress Notes (Signed)
Pt. Will be transported from [facility] to  radiology to [facility].  Alana informed.

## 2013-02-28 NOTE — H&P (Signed)
Subjective: The patient is a 79 year old white female who I performed an L4-5 decompression and fusion on. She has done well but took a fall and complains of back pain. She has failed medical management was worked up with a lumbar x-rays which demonstrated an L2 compression fracture. I discussed the various treatment options with patient including surgery. The patient has weighed the risks, benefits, and alternatives surgery and decided proceed with an L2 kyphoplasty.   Past Medical History  Diagnosis Date  . Hypertension   . Osteoporosis   . PMR (polymyalgia rheumatica)   . Tobacco abuse   . Glucose intolerance (impaired glucose tolerance)   . Osteoarthritis   . Arthritis     "right shoulder; that's why I had to have it replaced"    Past Surgical History  Procedure Laterality Date  . Cataract extraction w/ intraocular lens  implant, bilateral      2008   . Posterior fusion lumbar spine  03/28/12  . Total shoulder replacement  06/2011    right  . Shoulder arthroscopy w/ rotator cuff repair  ~ 01/2011    right  . Vaginal hysterectomy  1970's    No Known Allergies  History  Substance Use Topics  . Smoking status: Current Every Day Smoker -- 0.50 packs/day for 40 years    Types: Cigarettes    Last Attempt to Quit: 12/06/1991  . Smokeless tobacco: Never Used  . Alcohol Use: 8.4 oz/week    14 Glasses of wine per week    History reviewed. No pertinent family history. Prior to Admission medications   Medication Sig Start Date End Date Taking? Authorizing Provider  atenolol (TENORMIN) 12.5 mg TABS Take 12.5 mg by mouth daily.  09/04/12  Yes Roderick Pee, MD  HYDROcodone-acetaminophen [facility]) 10-325 MG per tablet Take 1 tablet by mouth every 4 (four) hours as needed for pain.   Yes Historical Provider, MD     Review of Systems  Positive ROS: As above  All other systems have been reviewed and were otherwise negative with the exception of those mentioned in the HPI and as  above.  Objective: Vital signs in last 24 hours: Temp:  [97.7 F (36.5 C)] 97.7 F (36.5 C) (03/27 1223) Pulse Rate:  [77] 77 (03/27 1223) Resp:  [20] 20 (03/27 1223) BP: (145)/(88) 145/88 mmHg (03/27 1223) SpO2:  [94 %] 94 % (03/27 1223)  General Appearance: Alert, cooperative, no distress, appears stated age Head: Normocephalic, without obvious abnormality, atraumatic Eyes: PERRL, conjunctiva/corneas clear, EOM's intact, fundi benign, both eyes      Ears: Normal TM's and external ear canals, both ears Throat: Lips, mucosa, and tongue normal; teeth and gums normal Neck: Supple, symmetrical, trachea midline, no adenopathy; thyroid: No enlargement/tenderness/nodules; no carotid bruit or JVD Back: Symmetric, no curvature, ROM normal, no CVA tenderness Lungs: Clear to auscultation bilaterally, respirations unlabored Heart: Regular rate and rhythm, S1 and S2 normal, no murmur, rub or gallop Abdomen: Soft, non-tender, bowel sounds active all four quadrants, no masses, no organomegaly Extremities: Extremities normal, atraumatic, no cyanosis or edema Pulses: 2+ and symmetric all extremities Skin: Skin color, texture, turgor normal, no rashes or lesions  NEUROLOGIC:   Mental status: alert and oriented, no aphasia, good attention span, Fund of knowledge/ memory ok Motor Exam - grossly normal Sensory Exam - grossly normal Reflexes:  Coordination - grossly normal Gait - grossly normal Balance - grossly normal Cranial Nerves: I: smell Not tested  II: visual acuity  OS: Normal  OD: Normal   II: visual fields Full to confrontation  II: pupils Equal, round, reactive to light  III,VII: ptosis None  III,IV,VI: extraocular muscles  Full ROM  V: mastication Normal  V: facial light touch sensation  Normal  V,VII: corneal reflex  Present  VII: facial muscle function - upper  Normal  VII: facial muscle function - lower Normal  VIII: hearing Not tested  IX: soft palate elevation  Normal   IX,X: gag reflex Present  XI: trapezius strength  5/5  XI: sternocleidomastoid strength 5/5  XI: neck flexion strength  5/5  XII: tongue strength  Normal    Data Review Lab Results  Component Value Date   WBC 7.0 02/26/2013   HGB 15.3* 02/26/2013   HCT 44.4 02/26/2013   MCV 95.7 02/26/2013   PLT 209 02/26/2013   Lab Results  Component Value Date   NA 137 02/26/2013   K 4.1 02/26/2013   CL 99 02/26/2013   CO2 27 02/26/2013   BUN 14 02/26/2013   CREATININE 0.57 02/26/2013   GLUCOSE 99 02/26/2013   Lab Results  Component Value Date   INR 0.97 06/16/2011    Assessment/Plan: L2 compression fracture: I discussed situation with the patient. I reviewed her imaging studies with her and pointed out the abnormalities. We have discussed the various treatment options including surgery. I have described the surgical treatment option of an L2 kyphoplasty. I described the surgery to her. I've shown her surgical models. We have discussed the risks, benefits, alternatives, and likelihood of achieving our goals with surgery. I've answered all the patient's questions. She has decided to proceed with surgery.   Madysun Thall D 02/28/2013 3:21 PM

## 2013-02-28 NOTE — Preoperative (Signed)
Beta Blockers   Reason not to administer Beta Blockers:Not Applicable 

## 2013-02-28 NOTE — Transfer of Care (Signed)
Immediate Anesthesia Transfer of Care Note  Patient: Shirley [name]  Procedure(s) Performed: Procedure(s) with comments: KYPHOPLASTY (N/A) - Lumbar two Kyphoplasty  Patient Location: PACU  Anesthesia Type:General  Level of Consciousness: awake, alert  and oriented  Airway & Oxygen Therapy: Patient Spontanous Breathing and Patient connected to nasal cannula oxygen  Post-op Assessment: Report given to PACU RN and Post -op Vital signs reviewed and stable  Post vital signs: Reviewed and stable  Complications: No apparent anesthesia complications

## 2013-03-01 MED ORDER — OXYCODONE-ACETAMINOPHEN 5-325 MG PO TABS: 1.0000 | ORAL_TABLET | ORAL | Status: DC | PRN

## 2013-03-01 MED ORDER — DSS 100 MG PO CAPS: 100.0000 mg | ORAL_CAPSULE | Freq: Two times a day (BID) | ORAL | Status: DC

## 2013-03-01 NOTE — Discharge Summary (Signed)
  Physician Discharge Summary  Patient ID: Shirley [name] MRN: [id number] DOB/AGE: 1934/09/07 79 y.o.  Admit date: 02/28/2013 Discharge date: 03/01/2013  Admission Diagnoses: L2 compression fracture, lumbago  Discharge Diagnoses: The same Active Problems:   * No active hospital problems. *   Discharged Condition: good  Hospital Course: I admitted the patient to [facility] 02/28/2013 with a diagnosis of a right L2 compression fracture. On that day I performed an L2 kyphoplasty. The surgery went well.  The patient's postoperative course was unremarkable. She requested discharge to home on postop day 1. The patient was given oral and written discharge instructions. All her questions were answered.  Consults: None Significant Diagnostic Studies: None Treatments: L2 kyphoplasty Discharge Exam: Blood pressure 114/62, pulse 65, temperature 99.1 F (37.3 C), temperature source Oral, resp. rate 16, SpO2 97.00%. The patient is alert and oriented. Her strength is normal in her lower extremities. Her dressing is clean and dry.  Disposition: Home   Future Appointments Provider Department Dept Phone   08/01/2013 11:00 AM Nilda Riggs, NP [facility] [phone]       Medication List    STOP taking these medications       HYDROcodone-acetaminophen 10-325 MG per tablet  Commonly known as:  NORCO      TAKE these medications       atenolol 12.5 mg Tabs  Commonly known as:  TENORMIN  Take 12.5 mg by mouth daily.     DSS 100 MG Caps  Take 100 mg by mouth 2 (two) times daily.     oxyCODONE-acetaminophen 5-325 MG per tablet  Commonly known as:  PERCOCET/ROXICET  Take 1-2 tablets by mouth every 4 (four) hours as needed.         SignedCristi Loron 03/01/2013, 7:54 AM

## 2013-03-04 ENCOUNTER — Encounter (HOSPITAL_COMMUNITY): Payer: Self-pay | Admitting: Neurosurgery

## 2013-05-16 LAB — HM DEXA SCAN

## 2013-08-01 ENCOUNTER — Ambulatory Visit: Payer: Self-pay | Admitting: Nurse Practitioner

## 2013-09-10 ENCOUNTER — Ambulatory Visit (INDEPENDENT_AMBULATORY_CARE_PROVIDER_SITE_OTHER): Payer: Medicare Other | Admitting: *Deleted

## 2013-09-10 DIAGNOSIS — Z23 Encounter for immunization: Secondary | ICD-10-CM

## 2013-10-20 ENCOUNTER — Inpatient Hospital Stay (HOSPITAL_COMMUNITY): Payer: Medicare Other | Admitting: Anesthesiology

## 2013-10-20 ENCOUNTER — Emergency Department (HOSPITAL_COMMUNITY): Payer: Medicare Other

## 2013-10-20 ENCOUNTER — Inpatient Hospital Stay (HOSPITAL_COMMUNITY)
Admission: EM | Admit: 2013-10-20 | Discharge: 2013-10-22 | DRG: 956 | Disposition: A | Discharge status: Skilled Nursing Facility | Payer: Medicare Other | Attending: Family Medicine | Admitting: Family Medicine

## 2013-10-20 ENCOUNTER — Encounter (HOSPITAL_COMMUNITY): Payer: Medicare Other | Admitting: Anesthesiology

## 2013-10-20 ENCOUNTER — Encounter (HOSPITAL_COMMUNITY)
Admission: EM | Disposition: A | Discharge status: Skilled Nursing Facility | Payer: Self-pay | Source: Home / Self Care | Attending: Family Medicine

## 2013-10-20 ENCOUNTER — Inpatient Hospital Stay (HOSPITAL_COMMUNITY): Payer: Medicare Other

## 2013-10-20 DIAGNOSIS — IMO0001 Reserved for inherently not codable concepts without codable children: Secondary | ICD-10-CM

## 2013-10-20 DIAGNOSIS — F1020 Alcohol dependence, uncomplicated: Secondary | ICD-10-CM | POA: Diagnosis present

## 2013-10-20 DIAGNOSIS — M1990 Unspecified osteoarthritis, unspecified site: Secondary | ICD-10-CM

## 2013-10-20 DIAGNOSIS — I10 Essential (primary) hypertension: Secondary | ICD-10-CM

## 2013-10-20 DIAGNOSIS — E739 Lactose intolerance, unspecified: Secondary | ICD-10-CM

## 2013-10-20 DIAGNOSIS — S51011A Laceration without foreign body of right elbow, initial encounter: Secondary | ICD-10-CM

## 2013-10-20 DIAGNOSIS — S32511A Fracture of superior rim of right pubis, initial encounter for closed fracture: Secondary | ICD-10-CM

## 2013-10-20 DIAGNOSIS — M5136 Other intervertebral disc degeneration, lumbar region: Secondary | ICD-10-CM

## 2013-10-20 DIAGNOSIS — S72009A Fracture of unspecified part of neck of unspecified femur, initial encounter for closed fracture: Secondary | ICD-10-CM

## 2013-10-20 DIAGNOSIS — Z96619 Presence of unspecified artificial shoulder joint: Secondary | ICD-10-CM

## 2013-10-20 DIAGNOSIS — W19XXXA Unspecified fall, initial encounter: Secondary | ICD-10-CM | POA: Diagnosis present

## 2013-10-20 DIAGNOSIS — F17200 Nicotine dependence, unspecified, uncomplicated: Secondary | ICD-10-CM | POA: Diagnosis present

## 2013-10-20 DIAGNOSIS — S72001A Fracture of unspecified part of neck of right femur, initial encounter for closed fracture: Secondary | ICD-10-CM

## 2013-10-20 DIAGNOSIS — J45901 Unspecified asthma with (acute) exacerbation: Secondary | ICD-10-CM

## 2013-10-20 DIAGNOSIS — S32509A Unspecified fracture of unspecified pubis, initial encounter for closed fracture: Secondary | ICD-10-CM | POA: Diagnosis present

## 2013-10-20 DIAGNOSIS — F10939 Alcohol use, unspecified with withdrawal, unspecified: Secondary | ICD-10-CM | POA: Diagnosis present

## 2013-10-20 DIAGNOSIS — M353 Polymyalgia rheumatica: Secondary | ICD-10-CM | POA: Diagnosis present

## 2013-10-20 DIAGNOSIS — S32591A Other specified fracture of right pubis, initial encounter for closed fracture: Secondary | ICD-10-CM

## 2013-10-20 DIAGNOSIS — R300 Dysuria: Secondary | ICD-10-CM | POA: Diagnosis present

## 2013-10-20 DIAGNOSIS — F10239 Alcohol dependence with withdrawal, unspecified: Secondary | ICD-10-CM | POA: Diagnosis present

## 2013-10-20 DIAGNOSIS — R404 Transient alteration of awareness: Secondary | ICD-10-CM | POA: Diagnosis present

## 2013-10-20 DIAGNOSIS — M810 Age-related osteoporosis without current pathological fracture: Secondary | ICD-10-CM

## 2013-10-20 HISTORY — PX: HIP PINNING,CANNULATED: SHX1758

## 2013-10-20 LAB — BASIC METABOLIC PANEL
BUN: 15 mg/dL (ref 6–23)
CO2: 26 mEq/L (ref 19–32)
Calcium: 9.4 mg/dL (ref 8.4–10.5)
Chloride: 98 mEq/L (ref 96–112)
Creatinine, Ser: 0.46 mg/dL — ABNORMAL LOW (ref 0.50–1.10)
GFR calc Af Amer: >90 mL/min (ref >90)
GFR calc non Af Amer: >90 mL/min (ref >90)
Glucose, Bld: 105 mg/dL — ABNORMAL HIGH (ref 70–99)
Potassium: 3.7 mEq/L (ref 3.5–5.1)
Sodium: 135 mEq/L (ref 135–145)

## 2013-10-20 LAB — CBC WITH DIFFERENTIAL/PLATELET
Basophils Absolute: 0 10*3/uL (ref 0.0–0.1)
Basophils Relative: 0 % (ref 0–1)
Eosinophils Absolute: 0.1 10*3/uL (ref 0.0–0.7)
Eosinophils Relative: 1 % (ref 0–5)
HCT: 45.9 % (ref 36.0–46.0)
Hemoglobin: 15.8 g/dL — ABNORMAL HIGH (ref 12.0–15.0)
Lymphocytes Relative: 13 % (ref 12–46)
Lymphs Abs: 1.2 10*3/uL (ref 0.7–4.0)
MCH: 33.1 pg (ref 26.0–34.0)
MCHC: 34.4 g/dL (ref 30.0–36.0)
MCV: 96 fL (ref 78.0–100.0)
Monocytes Absolute: 1.1 10*3/uL — ABNORMAL HIGH (ref 0.1–1.0)
Monocytes Relative: 12 % (ref 3–12)
Neutro Abs: 6.8 10*3/uL (ref 1.7–7.7)
Neutrophils Relative %: 74 % (ref 43–77)
Platelets: 159 10*3/uL (ref 150–400)
RBC: 4.78 MIL/uL (ref 3.87–5.11)
RDW: 12.8 % (ref 11.5–15.5)
WBC: 9.2 10*3/uL (ref 4.0–10.5)

## 2013-10-20 LAB — PROTIME-INR
INR: 1 (ref 0.00–1.49)
Prothrombin Time: 13 seconds (ref 11.6–15.2)

## 2013-10-20 SURGERY — FIXATION, FEMUR, NECK, PERCUTANEOUS, USING SCREW
Anesthesia: General | Site: Hip | Laterality: Right | Wound class: Clean

## 2013-10-20 MED ORDER — MORPHINE SULFATE 2 MG/ML IJ SOLN: 0.5000 mg | INTRAMUSCULAR | Status: DC | PRN

## 2013-10-20 MED ORDER — METOCLOPRAMIDE HCL 10 MG PO TABS: 5.0000 mg | ORAL_TABLET | Freq: Three times a day (TID) | ORAL | Status: DC | PRN

## 2013-10-20 MED ORDER — ONDANSETRON HCL 4 MG/2ML IJ SOLN
INTRAMUSCULAR | Status: DC | PRN
  Administered 2013-10-20 (×2): 2 mg via INTRAVENOUS

## 2013-10-20 MED ORDER — HYDRALAZINE HCL 20 MG/ML IJ SOLN
2.0000 mg | Freq: Four times a day (QID) | INTRAMUSCULAR | Status: DC | PRN
  Filled 2013-10-20: qty 0.1

## 2013-10-20 MED ORDER — SODIUM CHLORIDE 0.9 % IV SOLN: INTRAVENOUS | Status: DC

## 2013-10-20 MED ORDER — ONDANSETRON HCL 4 MG PO TABS: 4.0000 mg | ORAL_TABLET | Freq: Four times a day (QID) | ORAL | Status: DC | PRN

## 2013-10-20 MED ORDER — HYDROCODONE-ACETAMINOPHEN 5-325 MG PO TABS: 1.0000 | ORAL_TABLET | ORAL | Status: DC | PRN

## 2013-10-20 MED ORDER — CEFAZOLIN SODIUM-DEXTROSE 2-3 GM-% IV SOLR
INTRAVENOUS | Status: AC
  Filled 2013-10-20: qty 50

## 2013-10-20 MED ORDER — PROPOFOL 10 MG/ML IV BOLUS
INTRAVENOUS | Status: DC | PRN
  Administered 2013-10-20: 150 mg via INTRAVENOUS

## 2013-10-20 MED ORDER — ASPIRIN EC 325 MG PO TBEC
325.0000 mg | DELAYED_RELEASE_TABLET | Freq: Every day | ORAL | Status: DC
  Administered 2013-10-21 – 2013-10-22 (×2): 325 mg via ORAL
  Filled 2013-10-20 (×3): qty 1

## 2013-10-20 MED ORDER — MEPERIDINE HCL 50 MG/ML IJ SOLN: 6.2500 mg | INTRAMUSCULAR | Status: DC | PRN

## 2013-10-20 MED ORDER — BUPIVACAINE HCL (PF) 0.5 % IJ SOLN
INTRAMUSCULAR | Status: AC
  Filled 2013-10-20: qty 30

## 2013-10-20 MED ORDER — LIDOCAINE HCL (CARDIAC) 20 MG/ML IV SOLN
INTRAVENOUS | Status: DC | PRN
  Administered 2013-10-20: 30 mg via INTRAVENOUS

## 2013-10-20 MED ORDER — HYDROMORPHONE HCL PF 1 MG/ML IJ SOLN: 1.0000 mg | INTRAMUSCULAR | Status: AC | PRN

## 2013-10-20 MED ORDER — OXYCODONE HCL 5 MG PO TABS: 5.0000 mg | ORAL_TABLET | ORAL | Status: DC | PRN

## 2013-10-20 MED ORDER — METOCLOPRAMIDE HCL 5 MG/ML IJ SOLN: 5.0000 mg | Freq: Three times a day (TID) | INTRAMUSCULAR | Status: DC | PRN

## 2013-10-20 MED ORDER — LACTATED RINGERS IV SOLN
INTRAVENOUS | Status: DC | PRN
  Administered 2013-10-20: 18:00:00 via INTRAVENOUS

## 2013-10-20 MED ORDER — BISACODYL 10 MG RE SUPP: 10.0000 mg | Freq: Every day | RECTAL | Status: DC | PRN

## 2013-10-20 MED ORDER — ONDANSETRON HCL 4 MG/2ML IJ SOLN: 4.0000 mg | Freq: Four times a day (QID) | INTRAMUSCULAR | Status: DC | PRN

## 2013-10-20 MED ORDER — BUPIVACAINE HCL 0.5 % IJ SOLN
INTRAMUSCULAR | Status: DC | PRN
  Administered 2013-10-20: 30 mL

## 2013-10-20 MED ORDER — ACETAMINOPHEN 650 MG RE SUPP: 650.0000 mg | Freq: Four times a day (QID) | RECTAL | Status: DC | PRN

## 2013-10-20 MED ORDER — ONDANSETRON HCL 4 MG/2ML IJ SOLN: 4.0000 mg | Freq: Three times a day (TID) | INTRAMUSCULAR | Status: AC | PRN

## 2013-10-20 MED ORDER — FLEET ENEMA 7-19 GM/118ML RE ENEM: 1.0000 | ENEMA | Freq: Once | RECTAL | Status: AC | PRN

## 2013-10-20 MED ORDER — FENTANYL CITRATE 0.05 MG/ML IJ SOLN: 25.0000 ug | INTRAMUSCULAR | Status: DC | PRN

## 2013-10-20 MED ORDER — PHENOL 1.4 % MT LIQD
1.0000 | OROMUCOSAL | Status: DC | PRN
  Filled 2013-10-20: qty 177

## 2013-10-20 MED ORDER — METOPROLOL TARTRATE 25 MG PO TABS
25.0000 mg | ORAL_TABLET | Freq: Two times a day (BID) | ORAL | Status: DC
  Administered 2013-10-20 – 2013-10-22 (×4): 25 mg via ORAL
  Filled 2013-10-20 (×5): qty 1

## 2013-10-20 MED ORDER — ACETAMINOPHEN 325 MG PO TABS
650.0000 mg | ORAL_TABLET | Freq: Four times a day (QID) | ORAL | Status: DC | PRN
  Administered 2013-10-22: 650 mg via ORAL
  Filled 2013-10-20: qty 2

## 2013-10-20 MED ORDER — HYDROCODONE-ACETAMINOPHEN 5-325 MG PO TABS
1.0000 | ORAL_TABLET | Freq: Once | ORAL | Status: AC
  Administered 2013-10-20: 1 via ORAL
  Filled 2013-10-20: qty 1

## 2013-10-20 MED ORDER — SODIUM CHLORIDE 0.9 % IV SOLN
INTRAVENOUS | Status: DC
  Administered 2013-10-20 – 2013-10-21 (×2): via INTRAVENOUS

## 2013-10-20 MED ORDER — MENTHOL 3 MG MT LOZG
1.0000 | LOZENGE | OROMUCOSAL | Status: DC | PRN
  Filled 2013-10-20: qty 9

## 2013-10-20 MED ORDER — FENTANYL CITRATE 0.05 MG/ML IJ SOLN
INTRAMUSCULAR | Status: DC | PRN
  Administered 2013-10-20 (×5): 50 ug via INTRAVENOUS

## 2013-10-20 MED ORDER — CEFAZOLIN SODIUM-DEXTROSE 2-3 GM-% IV SOLR
2.0000 g | Freq: Once | INTRAVENOUS | Status: AC
  Administered 2013-10-20: 2 g via INTRAVENOUS

## 2013-10-20 MED ORDER — SUCCINYLCHOLINE CHLORIDE 20 MG/ML IJ SOLN
INTRAMUSCULAR | Status: DC | PRN
  Administered 2013-10-20: 100 mg via INTRAVENOUS

## 2013-10-20 MED ORDER — DOCUSATE SODIUM 100 MG PO CAPS
100.0000 mg | ORAL_CAPSULE | Freq: Two times a day (BID) | ORAL | Status: DC
  Administered 2013-10-20 – 2013-10-22 (×4): 100 mg via ORAL

## 2013-10-20 MED ORDER — HYDROCODONE-ACETAMINOPHEN 5-325 MG PO TABS: 1.0000 | ORAL_TABLET | Freq: Four times a day (QID) | ORAL | Status: DC | PRN

## 2013-10-20 MED ORDER — PROMETHAZINE HCL 25 MG/ML IJ SOLN: 6.2500 mg | INTRAMUSCULAR | Status: DC | PRN

## 2013-10-20 SURGICAL SUPPLY — 36 items
BAG SPEC THK2 15X12 ZIP CLS (MISCELLANEOUS) ×1
BAG ZIPLOCK 12X15 (MISCELLANEOUS) ×2 IMPLANT
BANDAGE ESMARK 6X9 LF (GAUZE/BANDAGES/DRESSINGS) ×1 IMPLANT
BANDAGE GAUZE ELAST BULKY 4 IN (GAUZE/BANDAGES/DRESSINGS) ×2 IMPLANT
BNDG CMPR 9X6 STRL LF SNTH (GAUZE/BANDAGES/DRESSINGS) ×1
BNDG ESMARK 6X9 LF (GAUZE/BANDAGES/DRESSINGS) ×2
DRAPE STERI IOBAN 125X83 (DRAPES) ×2 IMPLANT
DRSG MEPILEX BORDER 4X4 (GAUZE/BANDAGES/DRESSINGS) ×2 IMPLANT
DRSG MEPILEX BORDER 4X8 (GAUZE/BANDAGES/DRESSINGS) ×2 IMPLANT
DRSG PAD ABDOMINAL 8X10 ST (GAUZE/BANDAGES/DRESSINGS) ×2 IMPLANT
DURAPREP 26ML APPLICATOR (WOUND CARE) ×2 IMPLANT
ELECT REM PT RETURN 9FT ADLT (ELECTROSURGICAL) ×2
ELECTRODE REM PT RTRN 9FT ADLT (ELECTROSURGICAL) ×1 IMPLANT
FACESHIELD LNG OPTICON STERILE (SAFETY) ×4 IMPLANT
GLOVE ORTHO TXT STRL SZ7.5 (GLOVE) ×2 IMPLANT
GOWN PREVENTION PLUS LG XLONG (DISPOSABLE) ×2 IMPLANT
GUIDEWIRE THREADED 2.8 (WIRE) ×3 IMPLANT
NS IRRIG 1000ML POUR BTL (IV SOLUTION) ×2 IMPLANT
PACK GENERAL/GYN (CUSTOM PROCEDURE TRAY) ×2 IMPLANT
PAD CAST 4YDX4 CTTN HI CHSV (CAST SUPPLIES) ×1 IMPLANT
PADDING CAST COTTON 4X4 STRL (CAST SUPPLIES) ×2
PADDING CAST COTTON 6X4 STRL (CAST SUPPLIES) ×2 IMPLANT
POSITIONER SURGICAL ARM (MISCELLANEOUS) ×2 IMPLANT
SCREW CANN 16 THRD/85 7.3 (Screw) ×2 IMPLANT
SCREW CANN 16 THRD/90 7.3 (Screw) ×1 IMPLANT
SPONGE GAUZE 4X4 12PLY (GAUZE/BANDAGES/DRESSINGS) ×2 IMPLANT
STAPLER SKIN PROX WIDE 3.9 (STAPLE) ×2 IMPLANT
STAPLER VISISTAT 35W (STAPLE) ×2 IMPLANT
SUT VIC AB 1 CT1 27 (SUTURE) ×4
SUT VIC AB 1 CT1 27XBRD ANTBC (SUTURE) ×2 IMPLANT
SUT VIC AB 2-0 CT1 27 (SUTURE) ×4
SUT VIC AB 2-0 CT1 TAPERPNT 27 (SUTURE) ×2 IMPLANT
SYR CONTROL 10ML LL (SYRINGE) ×1 IMPLANT
TOWEL OR 17X26 10 PK STRL BLUE (TOWEL DISPOSABLE) ×2 IMPLANT
TRAY FOLEY CATH 14FRSI W/METER (CATHETERS) ×2 IMPLANT
WATER STERILE IRR 1500ML POUR (IV SOLUTION) ×2 IMPLANT

## 2013-10-20 NOTE — Transfer of Care (Signed)
Immediate Anesthesia Transfer of Care Note  Patient: Shirley [name]  Procedure(s) Performed: Procedure(s): CANNULATED HIP PINNING (Right)  Patient Location: PACU  Anesthesia Type:General  Level of Consciousness: awake, sedated and patient cooperative  Airway & Oxygen Therapy: Patient Spontanous Breathing and Patient connected to face mask oxygen  Post-op Assessment: Report given to PACU RN and Post -op Vital signs reviewed and stable  Post vital signs: Reviewed and stable  Complications: No apparent anesthesia complications

## 2013-10-20 NOTE — Progress Notes (Signed)
Patient ID: Shirley [name], female   DOB: 04/24/1934, 79 y.o.   MRN: [id number] Due to left pubic rami fractures likely will be transfers for a couple weeks before she can use a walker and FWB on left LE and will be TDWB on right femoral neck pinning for 6 wks.   Likely SNF for a few weeks. Husband at home needs help, daughter works and other daughter lives out of town several hrs away.  

## 2013-10-20 NOTE — Interval H&P Note (Signed)
History and Physical Interval Note:  10/20/2013 7:30 PM  Shirley [name]  has presented today for surgery, with the diagnosis of right hip fracture  The various methods of treatment have been discussed with the patient and family. After consideration of risks, benefits and other options for treatment, the patient has consented to  Procedure(s): CANNULATED HIP PINNING (Right) as a surgical intervention .  The patient's history has been reviewed, patient examined, no change in status, stable for surgery.  I have reviewed the patient's chart and labs.  Questions were answered to the patient's satisfaction.     [name] C

## 2013-10-20 NOTE — Op Note (Signed)
Test test test  Preop diagnosis: Right nondisplaced femoral neck fracture, left superior and inferior pubic rami fracture.  Postop diagnosis: Same  Procedure: Percutaneous screw fixation of right nondisplaced femoral neck fracture. Synthes cannulated screws 7 mm x3.  Surgeon: Annell Greening M.D.  Anesthesia: Gen. plus Marcaine skin local 10 cc  EBL: Minimal  Complications: None  Procedure: After the induction of general anesthesia a shows placed on the fracture table well legholder for left lower extremity right leg was placed in the boot without manipulation. Gen. internal rotation was placed, aligning the patella pointed toward the ceiling.   Standard prepping and draping was performed timeout procedure was completed Ancef was given prophylactically. C-arm was used to confirm that the fracture remained nondisplaced. Under fluoroscopic visualization a 2 cm incision was made in the septic region and was placed in the lateral cortex in appropriate alignment and passed up the femoral neck into the head. Second third and was then used with the dual pronged guide to place 2 pins. One pin angulated slightly which was more superior and posterior and this angled slightly toward the center on AP x-ray and remain in good position in the head. All pins press passed up the fracture site. Measurements are made to 85 mm screws and one 90 mm screw were placed based on depth gauge measurements,  subtracting 5 mm. Once pins are placed hip was taken out of the boot under direct fluoroscopic  visualization the hip was flexed internally Rotated and extended and internally Rotated showing all pins pistoning and they'll remain well within the head and good fixation with all threads across the fracture site up in the head. There was good diversion on lateral and AP x-ray. All screws on AP were low in the head.  Was irrigated reapproximation of the tensor fascia 2-0 Vicryl to on the subtendinous tissue 5 or 6 skin staples  are placed in postop dressing patient tolerated the procedure well transferred recovery room in stable condition. Signed Annell Greening M.D.

## 2013-10-20 NOTE — ED Notes (Signed)
Bed: WA18 Expected date:  Expected time:  Means of arrival:  Comments: ems 

## 2013-10-20 NOTE — ED Provider Notes (Signed)
CSN: [id number]     Arrival date & time 10/20/13  1144 History   First MD Initiated Contact with Patient 10/20/13 1157     Chief Complaint  Patient presents with  . Fall, Right hip pain    (Consider location/radiation/quality/duration/timing/severity/associated sxs/prior Treatment) HPI Comments: Pt has chronic numbness of RLE since prior back surgery several months ago, usually puts L foot first going up steps, but put r foot first going up step yesterday, fell onto R side, striking R hip, R knee, R elbow. No LOC, no h/a, neck pain, CP, ab pain, L sided pain, new numbness, weakness, fever, respiratory, GI or GU symptoms.   Patient is a 79 y.o. female presenting with fall. The history is provided by the patient. No language interpreter was used.  Fall This is a new problem. The current episode started yesterday. The problem occurs constantly. The problem has not changed since onset.Pertinent negatives include no chest pain, no abdominal pain, no headaches and no shortness of breath. The symptoms are aggravated by standing. The symptoms are relieved by rest. She has tried rest for the symptoms. The treatment provided no relief.    Past Medical History  Diagnosis Date  . Hypertension   . Osteoporosis   . PMR (polymyalgia rheumatica)   . Tobacco abuse   . Glucose intolerance (impaired glucose tolerance)   . Osteoarthritis   . Arthritis     "right shoulder; that's why I had to have it replaced"   Past Surgical History  Procedure Laterality Date  . Cataract extraction w/ intraocular lens  implant, bilateral      2008   . Posterior fusion lumbar spine  03/28/12  . Total shoulder replacement  06/2011    right  . Shoulder arthroscopy w/ rotator cuff repair  ~ 01/2011    right  . Vaginal hysterectomy  1970's  . Kyphoplasty N/A 02/28/2013    Procedure: KYPHOPLASTY;  Surgeon: Cristi Loron, MD;  Location: [facility];  Service: Neurosurgery;  Laterality: N/A;  Lumbar two Kyphoplasty    History reviewed. No pertinent family history. History  Substance Use Topics  . Smoking status: Current Every Day Smoker -- 0.50 packs/day for 40 years    Types: Cigarettes    Last Attempt to Quit: 12/06/1991  . Smokeless tobacco: Never Used  . Alcohol Use: 8.4 oz/week    14 Glasses of wine per week   OB History   Grav Para Term Preterm Abortions TAB SAB Ect Mult Living   3              Review of Systems  Constitutional: Negative for fever, chills, diaphoresis, activity change, appetite change and fatigue.  HENT: Negative for congestion, facial swelling, rhinorrhea and sore throat.   Eyes: Negative for photophobia and discharge.  Respiratory: Negative for cough, chest tightness and shortness of breath.   Cardiovascular: Negative for chest pain, palpitations and leg swelling.  Gastrointestinal: Negative for nausea, vomiting, abdominal pain and diarrhea.  Endocrine: Negative for polydipsia and polyuria.  Genitourinary: Negative for dysuria, frequency, difficulty urinating and pelvic pain.  Musculoskeletal: Positive for arthralgias. Negative for back pain, neck pain and neck stiffness.  Skin: Positive for wound. Negative for color change.  Allergic/Immunologic: Negative for immunocompromised state.  Neurological: Positive for numbness (RLE, unchanged). Negative for facial asymmetry, weakness and headaches.  Hematological: Does not bruise/bleed easily.  Psychiatric/Behavioral: Negative for confusion and agitation.    Allergies  Review of patient's allergies indicates no known allergies.  Home  Medications   Current Outpatient Rx  Name  Route  Sig  Dispense  Refill  . aspirin (BAYER ASPIRIN) 325 MG tablet   Oral   Take 1 tablet (325 mg total) by mouth daily.   60 tablet   0   . HYDROcodone-acetaminophen (NORCO/VICODIN) 5-325 MG per tablet   Oral   Take 1 tablet by mouth every 4 (four) hours as needed for moderate pain.   30 tablet   0    BP 136/86  Pulse 88   Temp(Src) 98.6 F (37 C) (Oral)  Resp 16  Ht 5\' 5"  (1.651 m)  Wt 109 lb (49.442 kg)  BMI 18.14 kg/m2  SpO2 96% Physical Exam  Constitutional: She is oriented to person, place, and time. She appears well-developed and well-nourished. No distress.  HENT:  Head: Normocephalic and atraumatic.  Mouth/Throat: No oropharyngeal exudate.  Eyes: Pupils are equal, round, and reactive to light.  Neck: Normal range of motion. Neck supple.  Cardiovascular: Normal rate, regular rhythm and normal heart sounds.  Exam reveals no gallop and no friction rub.   No murmur heard. Pulmonary/Chest: Effort normal and breath sounds normal. No respiratory distress. She has no wheezes. She has no rales.  Abdominal: Soft. Bowel sounds are normal. She exhibits no distension and no mass. There is no tenderness. There is no rebound and no guarding.  Musculoskeletal: She exhibits no edema and no tenderness.       Right hip: She exhibits decreased range of motion and bony tenderness. She exhibits no laceration.       Right forearm: She exhibits laceration.       Arms:      Legs: Neurological: She is alert and oriented to person, place, and time. A sensory deficit is present. No cranial nerve deficit. GCS eye subscore is 4. GCS verbal subscore is 5. GCS motor subscore is 6.  Dec sensation R foot, DEC ROM at R hip due to pain  Skin: Skin is warm and dry.  Psychiatric: She has a normal mood and affect.    ED Course  LACERATION REPAIR Date/Time: 10/21/2013 11:16 AM Performed by: Toy Cookey, E Authorized by: Kathlen Mody Consent: Verbal consent obtained. written consent not obtained. Risks and benefits: risks, benefits and alternatives were discussed Consent given by: patient Patient understanding: patient states understanding of the procedure being performed Patient consent: the patient's understanding of the procedure matches consent given Procedure consent: procedure consent matches procedure  scheduled Relevant documents: relevant documents present and verified Test results: test results available and properly labeled Site marked: the operative site was marked Imaging studies: imaging studies available Required items: required blood products, implants, devices, and special equipment available Patient identity confirmed: verbally with patient Time out: Immediately prior to procedure a "time out" was called to verify the correct patient, procedure, equipment, support staff and site/side marked as required. Body area: upper extremity Location details: right elbow Laceration length: 0.5 cm Foreign bodies: no foreign bodies Tendon involvement: none Nerve involvement: none Vascular damage: no Anesthesia: local infiltration Local anesthetic: lidocaine 1% with epinephrine Anesthetic total: 1 ml Patient sedated: no Preparation: Patient was prepped and draped in the usual sterile fashion. Irrigation solution: saline Irrigation method: syringe Amount of cleaning: extensive Debridement: none Skin closure: 4-0 Prolene Number of sutures: 1 Suture technique: corner  Approximation: loose Approximation difficulty: simple Dressing: 4x4 sterile gauze and antibiotic ointment Patient tolerance: Patient tolerated the procedure well with no immediate complications.   (including critical care time) Labs Review  Labs Reviewed  CBC WITH DIFFERENTIAL - Abnormal; Notable for the following:    Hemoglobin 15.8 (*)    Monocytes Absolute 1.1 (*)    All other components within normal limits  BASIC METABOLIC PANEL - Abnormal; Notable for the following:    Glucose, Bld 105 (*)    Creatinine, Ser 0.46 (*)    All other components within normal limits  URINALYSIS, ROUTINE W REFLEX MICROSCOPIC - Abnormal; Notable for the following:    Hgb urine dipstick MODERATE (*)    Ketones, ur 40 (*)    Leukocytes, UA MODERATE (*)    All other components within normal limits  BASIC METABOLIC PANEL - Abnormal;  Notable for the following:    Sodium 133 (*)    Glucose, Bld 101 (*)    GFR calc non Af Amer 90 (*)    All other components within normal limits  CBC - Abnormal; Notable for the following:    Hemoglobin 15.6 (*)    Platelets 149 (*)    All other components within normal limits  URINE MICROSCOPIC-ADD ON - Abnormal; Notable for the following:    Bacteria, UA FEW (*)    All other components within normal limits  URINE CULTURE  PROTIME-INR   Imaging Review Dg Elbow Complete Right  10/20/2013   CLINICAL DATA:  Patient fell yesterday with pain.  EXAM: RIGHT ELBOW - COMPLETE 3+ VIEW  COMPARISON:  None.  FINDINGS: The right elbow demonstrates no fracture or dislocation. There is a well corticated ossific fragment adjacent to the lateral epicondyle likely representing sequela of prior avulsive injury. There is no significant joint effusion. There is a soft tissue laceration overlying the olecranon process.  IMPRESSION: No acute osseous injury of the right elbow.   Electronically Signed   By: Elige Ko   On: 10/20/2013 13:09   Dg Hip Complete Right  10/20/2013   CLINICAL DATA:  Patient fell yesterday her in the right hip  EXAM: RIGHT HIP - COMPLETE 2+ VIEW  COMPARISON:  None.  FINDINGS: There is a nondisplaced right femoral neck fracture. There is no dislocation. There are minimally displaced left superior and inferior pubic rami fractures. There is no left femoral fracture.  IMPRESSION: 1.  Nondisplaced right femoral neck fracture.  2. Minimally displaced left superior and inferior pubic rami fractures.   Electronically Signed   By: Elige Ko   On: 10/20/2013 13:08   Dg Hip Operative Right  10/20/2013   CLINICAL DATA:  Right femoral neck fracture.  EXAM: DG OPERATIVE RIGHT HIP  TECHNIQUE: A single spot fluoroscopic AP image of the right hip is submitted.  COMPARISON:  Radiographs dated 10/20/2013  FINDINGS: C-arm images demonstrate that 3 pins have been placed across the right femoral neck  fracture. Alignment of the fracture fragments is essentially anatomic.  IMPRESSION: Open reduction and internal fixation of right femoral neck fracture.   Electronically Signed   By: Geanie Cooley M.D.   On: 10/20/2013 20:14   Dg Chest Port 1 View  10/20/2013   CLINICAL DATA:  HIP FRACTURE, PREOPERATIVE. HYPERTENSION. TOBACCO USE.  EXAM: PORTABLE CHEST - 1 VIEW  COMPARISON:  02/28/2013  FINDINGS: Emphysema noted. Leftward convexity of the descending thoracic aorta contour, likely due to tortuosity. Possible interval compression fracture at t12-consider lateral radiography if feasible.  Right shoulder prosthesis.  IMPRESSION: 1. T12 vertebral body may have mildly intervally compressed since march. Consider lateral thoracic or lumbar spine radiography for further characterization, if clinically warranted. 2. emphysema.  3. Leftward convexity of the descending thoracic aorta contour, likely due to tortuosity.   Electronically Signed   By: Herbie Baltimore M.D.   On: 10/20/2013 17:24    EKG Interpretation     Ventricular Rate:    PR Interval:    QRS Duration:   QT Interval:    QTC Calculation:   R Axis:     Text Interpretation:             Date: 10/21/2013  Rate: 87  Rhythm: sinus with sinus arrythmia  QRS Axis: normal  Intervals: normal  ST/T Wave abnormalities: nonspecific T wave changes  Conduction Disutrbances:none  Narrative Interpretation:   Old EKG Reviewed: unchanged    MDM   1. Femoral neck fracture, right, closed, initial encounter   2. Closed fracture of right superior pubic ramus   3. Closed fracture of right inferior pubic ramus   4. Elbow laceration, right, initial encounter    Pt is a 79 y.o. female with Pmhx as above who presents with R elbow, R hip pain after fall down 1 step last night.  Denies ha/, neck pain, chest pain, ab pain, L sided pain.  No new numbness or weakness.  VSS, pt hypertensive but in NAD on PE. +TTP over R hip, R olecranon with 0.5cm  laceration with exposed fascia.  Pt found to have femoral neck fx, sup/inf pubic rami fractures on R.  No fractures of elbow. Ortho and [facility] consulted.  [facility] will admit, pt to go to OR with ortho from ED.  Elbow lac repaired despite timing of injury after extensive irrigation as wound was gaping.         Shanna Cisco, MD 10/21/13 [phone]

## 2013-10-20 NOTE — Brief Op Note (Signed)
10/20/2013  7:13 PM  PATIENT:  [name]  79 y.o. female  PRE-OPERATIVE DIAGNOSIS:  right hip fracture, nondisplaced femoral neck fracture  POST-OPERATIVE DIAGNOSIS:  right hip fracture, nondisplaced femoral neck fracture  PROCEDURE:  Procedure(s): CANNULATED HIP PINNING (Right) synthes 7mm times 3.    SURGEON:  Surgeon(s) and Role:    * Eldred Manges, MD - Primary  PHYSICIAN ASSISTANT:   ASSISTANTS: none   ANESTHESIA:   local and general  EBL:     BLOOD ADMINISTERED:none  DRAINS: none   LOCAL MEDICATIONS USED:  MARCAINE     SPECIMEN:  No Specimen  DISPOSITION OF SPECIMEN:  N/A  COUNTS:  YES  TOURNIQUET:  * No tourniquets in log *  DICTATION: .Dragon Dictation  PLAN OF CARE: admission.   PATIENT DISPOSITION:  PACU - hemodynamically stable.   Delay start of Pharmacological VTE agent (>24hrs) due to surgical blood loss or risk of bleeding: yes

## 2013-10-20 NOTE — Consult Note (Signed)
Reason for Consult:right femoral neck fracture , nondisplaced.  And left pubic rami fractures.  Referring Physician: Blake Divine   MD  Shirley [name] is an 79 y.o. female.  HPI: community ambulator fell outside with right leg giving out and left superior and inferior pubic rami fracture and right nondisplaced femoral neck  Fracture.  Has had previous lumbar fusion Dr. Lovell Sheehan in March with some residual right leg numbness and weakness.   Past Medical History  Diagnosis Date  . Hypertension   . Osteoporosis   . PMR (polymyalgia rheumatica)   . Tobacco abuse   . Glucose intolerance (impaired glucose tolerance)   . Osteoarthritis   . Arthritis     "right shoulder; that's why I had to have it replaced"    Past Surgical History  Procedure Laterality Date  . Cataract extraction w/ intraocular lens  implant, bilateral      2008   . Posterior fusion lumbar spine  03/28/12  . Total shoulder replacement  06/2011    right  . Shoulder arthroscopy w/ rotator cuff repair  ~ 01/2011    right  . Vaginal hysterectomy  1970's  . Kyphoplasty N/A 02/28/2013    Procedure: KYPHOPLASTY;  Surgeon: Cristi Loron, MD;  Location: [facility];  Service: Neurosurgery;  Laterality: N/A;  Lumbar two Kyphoplasty    No family history on file.  Social History:  reports that she has been smoking Cigarettes.  She has a 20 pack-year smoking history. She has never used smokeless tobacco. She reports that she drinks about 8.4 ounces of alcohol per week. She reports that she does not use illicit drugs.  Allergies: No Known Allergies  Medications: I have reviewed the patient's current medications.  Results for orders placed during the hospital encounter of 10/20/13 (from the past 48 hour(s))  CBC WITH DIFFERENTIAL     Status: Abnormal   Collection Time    10/20/13  2:00 PM      Result Value Range   WBC 9.2  4.0 - 10.5 K/uL   RBC 4.78  3.87 - 5.11 MIL/uL   Hemoglobin 15.8 (*) 12.0 - 15.0 g/dL   HCT 82.9  56.2 -  13.0 %   MCV 96.0  78.0 - 100.0 fL   MCH 33.1  26.0 - 34.0 pg   MCHC 34.4  30.0 - 36.0 g/dL   RDW 86.5  78.4 - 69.6 %   Platelets 159  150 - 400 K/uL   Neutrophils Relative % 74  43 - 77 %   Neutro Abs 6.8  1.7 - 7.7 K/uL   Lymphocytes Relative 13  12 - 46 %   Lymphs Abs 1.2  0.7 - 4.0 K/uL   Monocytes Relative 12  3 - 12 %   Monocytes Absolute 1.1 (*) 0.1 - 1.0 K/uL   Eosinophils Relative 1  0 - 5 %   Eosinophils Absolute 0.1  0.0 - 0.7 K/uL   Basophils Relative 0  0 - 1 %   Basophils Absolute 0.0  0.0 - 0.1 K/uL  BASIC METABOLIC PANEL     Status: Abnormal   Collection Time    10/20/13  2:00 PM      Result Value Range   Sodium 135  135 - 145 mEq/L   Potassium 3.7  3.5 - 5.1 mEq/L   Chloride 98  96 - 112 mEq/L   CO2 26  19 - 32 mEq/L   Glucose, Bld 105 (*) 70 - 99 mg/dL  BUN 15  6 - 23 mg/dL   Creatinine, Ser 4.78 (*) 0.50 - 1.10 mg/dL   Calcium 9.4  8.4 - 29.5 mg/dL   GFR calc non Af Amer >90  >90 mL/min   GFR calc Af Amer >90  >90 mL/min   Comment: (NOTE)     The eGFR has been calculated using the CKD EPI equation.     This calculation has not been validated in all clinical situations.     eGFR's persistently <90 mL/min signify possible Chronic Kidney     Disease.  PROTIME-INR     Status: None   Collection Time    10/20/13  2:00 PM      Result Value Range   Prothrombin Time 13.0  11.6 - 15.2 seconds   INR 1.00  0.00 - 1.49    Dg Elbow Complete Right  10/20/2013   CLINICAL DATA:  Patient fell yesterday with pain.  EXAM: RIGHT ELBOW - COMPLETE 3+ VIEW  COMPARISON:  None.  FINDINGS: The right elbow demonstrates no fracture or dislocation. There is a well corticated ossific fragment adjacent to the lateral epicondyle likely representing sequela of prior avulsive injury. There is no significant joint effusion. There is a soft tissue laceration overlying the olecranon process.  IMPRESSION: No acute osseous injury of the right elbow.   Electronically Signed   By: Elige Ko    On: 10/20/2013 13:09   Dg Hip Complete Right  10/20/2013   CLINICAL DATA:  Patient fell yesterday her in the right hip  EXAM: RIGHT HIP - COMPLETE 2+ VIEW  COMPARISON:  None.  FINDINGS: There is a nondisplaced right femoral neck fracture. There is no dislocation. There are minimally displaced left superior and inferior pubic rami fractures. There is no left femoral fracture.  IMPRESSION: 1.  Nondisplaced right femoral neck fracture.  2. Minimally displaced left superior and inferior pubic rami fractures.   Electronically Signed   By: Elige Ko   On: 10/20/2013 13:08    Review of Systems  Constitutional: Negative for fever and chills.  HENT:       Left peri-orbital echymosis from fall  About one to two weeks ago.   Eyes: Negative for blurred vision and pain.  Respiratory: Negative for cough and sputum production.   Cardiovascular: Negative for chest pain and orthopnea.  Gastrointestinal: Negative for heartburn.  Genitourinary: Positive for dysuria and frequency.  Musculoskeletal: Positive for back pain.  Skin: Negative for rash.  Neurological: Positive for sensory change. Negative for dizziness and headaches.  Psychiatric/Behavioral: Negative for depression.   Blood pressure 172/80, pulse 78, temperature 98.4 F (36.9 C), temperature source Oral, resp. rate 17, SpO2 99.00%. Physical Exam  Constitutional: She is oriented to person, place, and time. She appears well-developed and well-nourished.  HENT:  Left peri-orbital eccymosis  Good vision both eyes.   Neck: Normal range of motion. Neck supple.  Cardiovascular: Normal rate.   Respiratory: Effort normal.  Musculoskeletal:  Pain with right LE hip ROM  Neurological: She is alert and oriented to person, place, and time.  Skin: Skin is warm and dry.  Psychiatric: She has a normal mood and affect. Her behavior is normal.    Assessment/Plan:  fall with nondisplaced right femoral neck fracture and left pubic rami fractures.  Plan  right femoral neck percutaneous pinning. Planned procedure discussed with patient and daughter who is here at bedside. Risks discussed she agrees and request we proceed.   Olegario Emberson C 10/20/2013, 4:38 PM

## 2013-10-20 NOTE — ED Notes (Signed)
Pt will get suture first on right elbow prior to transport

## 2013-10-20 NOTE — Anesthesia Preprocedure Evaluation (Addendum)
Anesthesia Evaluation  Patient identified by MRN, date of birth, ID band Patient awake    Reviewed: Allergy & Precautions, H&P , Patient's Chart, lab work & pertinent test results, reviewed documented beta blocker date and time   History of Anesthesia Complications Negative for: history of anesthetic complications  Airway Mallampati: II TM Distance: >3 FB Neck ROM: full    Dental no notable dental hx.    Pulmonary asthma , Current Smoker,  breath sounds clear to auscultation  Pulmonary exam normal       Cardiovascular Exercise Tolerance: Good hypertension, Rhythm:regular Rate:Normal     Neuro/Psych PSYCHIATRIC DISORDERS  Neuromuscular disease    GI/Hepatic   Endo/Other  Type 2  Renal/GU      Musculoskeletal   Abdominal   Peds  Hematology   Anesthesia Other Findings   Reproductive/Obstetrics                         Anesthesia Physical Anesthesia Plan  ASA: III and emergent  Anesthesia Plan: General ETT   Post-op Pain Management:    Induction:   Airway Management Planned:   Additional Equipment:   Intra-op Plan:   Post-operative Plan:   Informed Consent: I have reviewed the patients History and Physical, chart, labs and discussed the procedure including the risks, benefits and alternatives for the proposed anesthesia with the patient or authorized representative who has indicated his/her understanding and acceptance.   Dental Advisory Given  Plan Discussed with: CRNA and Surgeon  Anesthesia Plan Comments:         Anesthesia Quick Evaluation

## 2013-10-20 NOTE — ED Notes (Signed)
From home, fell last night now report right hip pain. Pain on ROM, good sensation, pulse, and movement. No deformity, bruising or swelling noted on right hop. Abrasion noted on right elbow. Denied LOC from fall. Pt alert, oriented, thoughts coherent. BP is high at 181/122 (no longer taking her BP med anymore)

## 2013-10-20 NOTE — H&P (Signed)
[facility] History and Physical  Shirley [name] JXB:[id number] DOB: June 19, 1934 DOA: 10/20/2013  Referring physician: DR Micheline Maze PCP: Evette Georges, MD  Specialists: Dr Ophelia Charter  Chief Complaint: mechanical fall today.   HPI: Shirley [name] is a 79 y.o. female with Hypertension not on medications for 3 years, PMR, came in after a mechanical fall. She was found to have right femur neck fracture. She was also found to have accelerated hypertension. She denies any other complaints other than pain in the right femur. Her labs are unremarkable. UA is pending. She is referred to medical service with orthopedics consulting for surgery tonight.   Review of Systems: The patient denies anorexia, fever, weight loss,, vision loss, decreased hearing, hoarseness, chest pain, syncope, dyspnea on exertion, peripheral edema, balance deficits, hemoptysis, abdominal pain, melena, hematochezia, severe indigestion/heartburn, hematuria,  genital sores, muscle weakness, suspicious skin lesions, transient blindness, difficulty walking, depression, unusual weight change, abnormal bleeding, enlarged lymph nodes, angioedema, and breast masses.    Past Medical History  Diagnosis Date  . Hypertension   . Osteoporosis   . PMR (polymyalgia rheumatica)   . Tobacco abuse   . Glucose intolerance (impaired glucose tolerance)   . Osteoarthritis   . Arthritis     "right shoulder; that's why I had to have it replaced"   Past Surgical History  Procedure Laterality Date  . Cataract extraction w/ intraocular lens  implant, bilateral      2008   . Posterior fusion lumbar spine  03/28/12  . Total shoulder replacement  06/2011    right  . Shoulder arthroscopy w/ rotator cuff repair  ~ 01/2011    right  . Vaginal hysterectomy  1970's  . Kyphoplasty N/A 02/28/2013    Procedure: KYPHOPLASTY;  Surgeon: Cristi Loron, MD;  Location: [facility];  Service: Neurosurgery;  Laterality: N/A;  Lumbar two Kyphoplasty    Social History:  reports that she has been smoking Cigarettes.  She has a 20 pack-year smoking history. She has never used smokeless tobacco. She reports that she drinks about 8.4 ounces of alcohol per week. She reports that she does not use illicit drugs.  where does patient live--home,  Can patient participate in ADLs?  yes No Known Allergies  No family history on file.  Prior to Admission medications   Not on File   Physical Exam: Filed Vitals:   10/20/13 1537  BP: 172/80  Pulse: 78  Temp: 98.4 F (36.9 C)  Resp: 17    Constitutional: Vital signs reviewed.  Patient is a well-developed and well-nourished  in no acute distress and cooperative with exam. Alert and oriented x3.  Head: Normocephalic and atraumatic Mouth: no erythema or exudates, MMM Eyes: PERRL, EOMI, conjunctivae normal, No scleral icterus.  Neck: Supple, Trachea midline normal ROM, No JVD, mass, thyromegaly, or carotid bruit present.  Cardiovascular: RRR, S1 normal, S2 normal, no MRG, pulses symmetric and intact bilaterally Pulmonary/Chest: normal respiratory effort, CTAB, no wheezes, rales, or rhonchi Abdominal: Soft. Non-tender, non-distended, bowel sounds are normal, no masses, organomegaly, or guarding present.  Musculoskeletal: painful right hip ROM.  Neurological: A&O x3, Speech normal. Able to move all extremities. No facial asymmetry.  Skin: Warm, dry and intact. No rash, cyanosis, or clubbing.  Psychiatric: Normal mood and affect.    Labs on Admission:  Basic Metabolic Panel:  Recent Labs Lab 10/20/13 1400  NA 135  K 3.7  CL 98  CO2 26  GLUCOSE 105*  BUN 15  CREATININE 0.46*  CALCIUM 9.4   Liver Function Tests: No results found for this basename: AST, ALT, ALKPHOS, BILITOT, PROT, ALBUMIN,  in the last 168 hours No results found for this basename: LIPASE, AMYLASE,  in the last 168 hours No results found for this basename: AMMONIA,  in the last 168 hours CBC:  Recent Labs Lab  10/20/13 1400  WBC 9.2  NEUTROABS 6.8  HGB 15.8*  HCT 45.9  MCV 96.0  PLT 159   Cardiac Enzymes: No results found for this basename: CKTOTAL, CKMB, CKMBINDEX, TROPONINI,  in the last 168 hours  BNP (last 3 results) No results found for this basename: PROBNP,  in the last 8760 hours CBG: No results found for this basename: GLUCAP,  in the last 168 hours  Radiological Exams on Admission: Dg Elbow Complete Right  10/20/2013   CLINICAL DATA:  Patient fell yesterday with pain.  EXAM: RIGHT ELBOW - COMPLETE 3+ VIEW  COMPARISON:  None.  FINDINGS: The right elbow demonstrates no fracture or dislocation. There is a well corticated ossific fragment adjacent to the lateral epicondyle likely representing sequela of prior avulsive injury. There is no significant joint effusion. There is a soft tissue laceration overlying the olecranon process.  IMPRESSION: No acute osseous injury of the right elbow.   Electronically Signed   By: Elige Ko   On: 10/20/2013 13:09   Dg Hip Complete Right  10/20/2013   CLINICAL DATA:  Patient fell yesterday her in the right hip  EXAM: RIGHT HIP - COMPLETE 2+ VIEW  COMPARISON:  None.  FINDINGS: There is a nondisplaced right femoral neck fracture. There is no dislocation. There are minimally displaced left superior and inferior pubic rami fractures. There is no left femoral fracture.  IMPRESSION: 1.  Nondisplaced right femoral neck fracture.  2. Minimally displaced left superior and inferior pubic rami fractures.   Electronically Signed   By: Elige Ko   On: 10/20/2013 13:08    EKG: pending.   Assessment/Plan Active Problems: 1. Right femur neck fracture: undisplaced: - admit for surgical repair.  - pain control and orthopedics consulted.  - PT eval after surgery.   2. Hypertension:  - accelerated.  - start pt on b blockers.  - prn hydralazine.   3. Dysuria: - get UA .   4. DVT prophylaxis.   Code Status: full code Family Communication: daughter at  bedside Disposition Plan: admit to med surg.  Time spent: 75 min  Brigitta Pricer [facility] Pager [phone]  If 7PM-7AM, please contact night-coverage [email or web address] Password [email or web address] 10/20/2013, 4:50 PM

## 2013-10-20 NOTE — H&P (View-Only) (Signed)
Patient ID: Shirley [name], female   DOB: 04/13/1934, 79 y.o.   MRN: [id number] Due to left pubic rami fractures likely will be transfers for a couple weeks before she can use a walker and FWB on left LE and will be TDWB on right femoral neck pinning for 6 wks.   Likely SNF for a few weeks. Husband at home needs help, daughter works and other daughter lives out of town several hrs away.

## 2013-10-20 NOTE — ED Notes (Signed)
Given to [employer] on 6E

## 2013-10-20 NOTE — Anesthesia Postprocedure Evaluation (Signed)
Anesthesia Post Note  Patient: Shirley [name]  Procedure(s) Performed: Procedure(s) (LRB): CANNULATED HIP PINNING (Right)  Anesthesia type: General  Patient location: PACU  Post pain: Pain level controlled  Post assessment: Post-op Vital signs reviewed  Last Vitals:  Filed Vitals:   10/20/13 1945  BP: 186/96  Pulse: 79  Temp:   Resp: 15    Post vital signs: Reviewed  Level of consciousness: sedated  Complications: No apparent anesthesia complications

## 2013-10-20 NOTE — ED Notes (Signed)
Orthopedic surgeon in room.

## 2013-10-21 ENCOUNTER — Encounter (HOSPITAL_COMMUNITY): Payer: Self-pay | Admitting: *Deleted

## 2013-10-21 LAB — BASIC METABOLIC PANEL
CO2: 24 mEq/L (ref 19–32)
Chloride: 98 mEq/L (ref 96–112)
Creatinine, Ser: 0.52 mg/dL (ref 0.50–1.10)
GFR calc non Af Amer: 90 mL/min — ABNORMAL LOW (ref >90)
Sodium: 133 mEq/L — ABNORMAL LOW (ref 135–145)

## 2013-10-21 LAB — CBC
HCT: 45.7 % (ref 36.0–46.0)
Hemoglobin: 15.6 g/dL — ABNORMAL HIGH (ref 12.0–15.0)
MCV: 97 fL (ref 78.0–100.0)
RBC: 4.71 MIL/uL (ref 3.87–5.11)
WBC: 9.1 10*3/uL (ref 4.0–10.5)

## 2013-10-21 LAB — URINALYSIS, ROUTINE W REFLEX MICROSCOPIC
Bilirubin Urine: NEGATIVE
Ketones, ur: 40 mg/dL — AB
Nitrite: NEGATIVE
Protein, ur: NEGATIVE mg/dL

## 2013-10-21 LAB — URINE MICROSCOPIC-ADD ON

## 2013-10-21 MED ORDER — ASPIRIN 325 MG PO TABS: 325.0000 mg | ORAL_TABLET | Freq: Every day | ORAL | Status: DC

## 2013-10-21 MED ORDER — LORAZEPAM 1 MG PO TABS
1.0000 mg | ORAL_TABLET | Freq: Once | ORAL | Status: AC
  Administered 2013-10-21: 23:00:00 1 mg via ORAL
  Filled 2013-10-21: qty 1

## 2013-10-21 MED ORDER — ENSURE COMPLETE PO LIQD
237.0000 mL | ORAL | Status: DC
  Administered 2013-10-21 – 2013-10-22 (×2): 237 mL via ORAL

## 2013-10-21 NOTE — Progress Notes (Signed)
[facility] PROGRESS NOTE  Shirley [name] KVQ:[id number] DOB: 1934/05/27 DOA: 10/20/2013 PCP: Evette Georges, MD  Assessment/Plan: 1. Postop day 1 s/p right cannulated hip pinning - pain controlled, up with therapy, SNF when bed available 2. Hypertension - much better controlled with meds  Code Status: Full  Family Communication: at bedside  Disposition Plan: to SNF rehab when bed available  HPI/Subjective: Pt sitting up in chair, no complaints today  Objective: Filed Vitals:   10/21/13 1052  BP: 136/86  Pulse: 88  Temp: 98.6 F (37 C)  Resp: 16    Intake/Output Summary (Last 24 hours) at 10/21/13 1144 Last data filed at 10/21/13 1123  Gross per 24 hour  Intake   1530 ml  Output   1600 ml  Net    -70 ml   Filed Weights   10/21/13 0300  Weight: 109 lb (49.442 kg)   Exam:   General:  Awake, alert, no distress, cooperative  Cardiovascular: normal s1, s2 sounds   Respiratory: BBS clear to ausculation  Abdomen: soft, nondistended, nontender       Musculoskeletal: right hip bandages clean and dry   Data Reviewed: Basic Metabolic Panel:  Recent Labs Lab 10/20/13 1400 10/21/13 0402  NA 135 133*  K 3.7 4.0  CL 98 98  CO2 26 24  GLUCOSE 105* 101*  BUN 15 13  CREATININE 0.46* 0.52  CALCIUM 9.4 8.9   Liver Function Tests: No results found for this basename: AST, ALT, ALKPHOS, BILITOT, PROT, ALBUMIN,  in the last 168 hours No results found for this basename: LIPASE, AMYLASE,  in the last 168 hours No results found for this basename: AMMONIA,  in the last 168 hours CBC:  Recent Labs Lab 10/20/13 1400 10/21/13 0402  WBC 9.2 9.1  NEUTROABS 6.8  --   HGB 15.8* 15.6*  HCT 45.9 45.7  MCV 96.0 97.0  PLT 159 149*   Cardiac Enzymes: No results found for this basename: CKTOTAL, CKMB, CKMBINDEX, TROPONINI,  in the last 168 hours BNP (last 3 results) No results found for this basename: PROBNP,  in the last 8760 hours CBG: No results found for  this basename: GLUCAP,  in the last 168 hours  No results found for this or any previous visit (from the past 240 hour(s)).   Studies: Dg Elbow Complete Right  10/20/2013   CLINICAL DATA:  Patient fell yesterday with pain.  EXAM: RIGHT ELBOW - COMPLETE 3+ VIEW  COMPARISON:  None.  FINDINGS: The right elbow demonstrates no fracture or dislocation. There is a well corticated ossific fragment adjacent to the lateral epicondyle likely representing sequela of prior avulsive injury. There is no significant joint effusion. There is a soft tissue laceration overlying the olecranon process.  IMPRESSION: No acute osseous injury of the right elbow.   Electronically Signed   By: Elige Ko   On: 10/20/2013 13:09   Dg Hip Complete Right  10/20/2013   CLINICAL DATA:  Patient fell yesterday her in the right hip  EXAM: RIGHT HIP - COMPLETE 2+ VIEW  COMPARISON:  None.  FINDINGS: There is a nondisplaced right femoral neck fracture. There is no dislocation. There are minimally displaced left superior and inferior pubic rami fractures. There is no left femoral fracture.  IMPRESSION: 1.  Nondisplaced right femoral neck fracture.  2. Minimally displaced left superior and inferior pubic rami fractures.   Electronically Signed   By: Elige Ko   On: 10/20/2013 13:08   Dg Hip Operative Right  10/20/2013   CLINICAL DATA:  Right femoral neck fracture.  EXAM: DG OPERATIVE RIGHT HIP  TECHNIQUE: A single spot fluoroscopic AP image of the right hip is submitted.  COMPARISON:  Radiographs dated 10/20/2013  FINDINGS: C-arm images demonstrate that 3 pins have been placed across the right femoral neck fracture. Alignment of the fracture fragments is essentially anatomic.  IMPRESSION: Open reduction and internal fixation of right femoral neck fracture.   Electronically Signed   By: Geanie Cooley M.D.   On: 10/20/2013 20:14   Dg Chest Port 1 View  10/20/2013   CLINICAL DATA:  HIP FRACTURE, PREOPERATIVE. HYPERTENSION. TOBACCO USE.   EXAM: PORTABLE CHEST - 1 VIEW  COMPARISON:  02/28/2013  FINDINGS: Emphysema noted. Leftward convexity of the descending thoracic aorta contour, likely due to tortuosity. Possible interval compression fracture at t12-consider lateral radiography if feasible.  Right shoulder prosthesis.  IMPRESSION: 1. T12 vertebral body may have mildly intervally compressed since march. Consider lateral thoracic or lumbar spine radiography for further characterization, if clinically warranted. 2. emphysema. 3. Leftward convexity of the descending thoracic aorta contour, likely due to tortuosity.   Electronically Signed   By: Herbie Baltimore M.D.   On: 10/20/2013 17:24    Scheduled Meds: . aspirin EC  325 mg Oral Q breakfast  . docusate sodium  100 mg Oral BID  . feeding supplement (ENSURE COMPLETE)  237 mL Oral Q24H  . metoprolol tartrate  25 mg Oral BID   Continuous Infusions: . sodium chloride    . sodium chloride 50 mL/hr at 10/20/13 1945    Active Problems:   * No active hospital problems. *   Jobe Mutch [employer] [phone]. If 7PM-7AM, please contact night-coverage at [email or web address], password [facility] 10/21/2013, 11:44 AM  LOS: 1 day

## 2013-10-21 NOTE — Progress Notes (Signed)
Clinical Social Work Department CLINICAL SOCIAL WORK PLACEMENT NOTE 10/21/2013  Patient:  Shirley [name], Shirley [name]  Account Number:  [id number] Admit date:  10/20/2013  Clinical Social Worker:  Cori Razor, LCSW  Date/time:  10/21/2013 12:39 PM  Clinical Social Work is seeking post-discharge placement for this patient at the following level of care:   SKILLED NURSING   (*CSW will update this form in Epic as items are completed)   10/21/2013  Patient/family provided with [facility] Department of Clinical Social Work's list of facilities offering this level of care within the geographic area requested by the patient (or if unable, by the patient's family).  10/21/2013  Patient/family informed of their freedom to choose among providers that offer the needed level of care, that participate in Medicare, Medicaid or managed care program needed by the patient, have an available bed and are willing to accept the patient.    Patient/family informed of [facility]' ownership interest in [facility], as well as of the fact that they are under no obligation to receive care at this facility.  PASARR submitted to EDS on 10/21/2013 PASARR number received from EDS on 10/21/2013  FL2 transmitted to all facilities in geographic area requested by pt/family on  10/21/2013 FL2 transmitted to all facilities within larger geographic area on   Patient informed that his/her managed care company has contracts with or will negotiate with  certain facilities, including the following:     Patient/family informed of bed offers received:   Patient chooses bed at  Physician recommends and patient chooses bed at    Patient to be transferred to  on   Patient to be transferred to facility by   The following physician request were entered in Epic:   Additional Comments:   Cori Razor LCSW [phone]

## 2013-10-21 NOTE — Evaluation (Signed)
Physical Therapy Evaluation Patient Details Name: Shirley [name] MRN: [id number] DOB: 04-09-1934 Today's Date: 10/21/2013 Time: 6213-0865 PT Time Calculation (min): 24 min  PT Assessment / Plan / Recommendation History of Present Illness  community ambulator fell outside with right leg giving out and left superior and inferior pubic rami fracture and right nondisplaced femoral neck  Fracture.  Has had previous lumbar fusion Dr. Lovell Sheehan in March with some residual right leg numbness and weakness; s/p  right hip pinning and with left pubic rami fxs  Clinical Impression  Pt will benefit from PT to address deficits below    PT Assessment  Patient needs continued PT services    Follow Up Recommendations  SNF    Does the patient have the potential to tolerate intense rehabilitation      Barriers to Discharge        Equipment Recommendations  None recommended by PT    Recommendations for Other Services     Frequency Min 3X/week    Precautions / Restrictions Precautions Precautions: Fall Restrictions Weight Bearing Restrictions: Yes RLE Weight Bearing: Touchdown weight bearing LLE Weight Bearing: Weight bearing as tolerated   Pertinent Vitals/Pain C/o pain right hip mostly, not rated; premedicated, no pain at rest      Mobility  Bed Mobility Bed Mobility: Supine to Sit Supine to Sit: 1: +2 Total assist Supine to Sit: Patient Percentage: 50% Details for Bed Mobility Assistance: cues for technique and UE self assist; +2 for trunk to full upright and assist wtih bil LEs for pain control Transfers Transfers: Sit to Stand;Stand to Sit Sit to Stand: 3: Mod assist;With upper extremity assist Stand to Sit: 3: Mod assist;With upper extremity assist Details for Transfer Assistance: verbal cues for hand placement and safe technique Ambulation/Gait Ambulation/Gait Assistance: 1: +2 Total assist Ambulation/Gait: Patient Percentage: 60% Ambulation Distance (Feet): 5 Feet Assistive  device: Rolling walker Ambulation/Gait Assistance Details: +2 to maintain TDWB (had difficulty), for chair, IV, safety Gait Pattern: Step-to pattern    Exercises     PT Diagnosis: Difficulty walking;Generalized weakness;Acute pain  PT Problem List: Decreased strength;Decreased range of motion;Decreased activity tolerance;Decreased balance;Decreased mobility;Decreased knowledge of precautions;Decreased knowledge of use of DME PT Treatment Interventions: DME instruction;Gait training;Functional mobility training;Therapeutic activities;Therapeutic exercise;Patient/family education     PT Goals(Current goals can be found in the care plan section) Acute Rehab PT Goals Patient Stated Goal: walk PT Goal Formulation: With patient Time For Goal Achievement: 10/28/13 Potential to Achieve Goals: Good  Visit Information  Last PT Received On: 10/21/13 Assistance Needed: +2 History of Present Illness: community ambulator fell outside with right leg giving out and left superior and inferior pubic rami fracture and right nondisplaced femoral neck  Fracture.  Has had previous lumbar fusion Dr. Lovell Sheehan in March with some residual right leg numbness and weakness; s/p  right hip pinning and with left pubic rami fxs       Prior Functioning  Home Living Family/patient expects to be discharged to:: Skilled nursing facility Living Arrangements: Spouse/significant other Available Help at Discharge: Family Type of Home: House Home Layout: Two level;Multi-level;Able to live on main level with bedroom/bathroom Alternate Level Stairs-Number of Steps: 2 half flights Home Equipment: Walker - 2 wheels;Gilmer Mor - single point Additional Comments: husband had ?subdural hematoma around April and needs supervision; pt states he "comes and goes" Communication Communication: No difficulties    Cognition  Cognition Arousal/Alertness: Awake/alert Behavior During Therapy: WFL for tasks assessed/performed Overall  Cognitive Status: Within Functional Limits for tasks  assessed    Extremity/Trunk Assessment Upper Extremity Assessment Upper Extremity Assessment: Defer to OT evaluation Lower Extremity Assessment Lower Extremity Assessment: RLE deficits/detail;LLE deficits/detail RLE Deficits / Details: grossly 2+/5; ankle WFL RLE: Unable to fully assess due to pain LLE Deficits / Details: AAROM WFL; at least 3+/5   Balance    End of Session PT - End of Session Equipment Utilized During Treatment: Gait belt Activity Tolerance: Patient tolerated treatment well Patient left: in chair;with call bell/phone within reach Nurse Communication: Mobility status  GP     [facility] 10/21/2013, 1:37 PM

## 2013-10-21 NOTE — Progress Notes (Signed)
10/21/13 1600  PT Visit Information  Last PT Received On 10/21/13  Assistance Needed +1  PT Time Calculation  PT Start Time 1516  PT Stop Time 1529  PT Time Calculation (min) 13 min  Subjective Data  Patient Stated Goal walk  Precautions  Precautions Fall  Restrictions  RLE Weight Bearing TWB  LLE Weight Bearing WBAT  Cognition  Arousal/Alertness Awake/alert  Behavior During Therapy WFL for tasks assessed/performed  Overall Cognitive Status Within Functional Limits for tasks assessed  Total Joint Exercises  Ankle Circles/Pumps AROM;Both;10 reps  Quad Sets AROM;Strengthening;Both;15 reps  Hip ABduction/ADduction AAROM;Both;15 reps  Heel Slides AAROM;Both;15 reps  PT - End of Session  Equipment Utilized During Treatment Gait belt  Activity Tolerance Patient tolerated treatment well  Patient left in chair;with call bell/phone within reach  Nurse Communication Mobility status (OT coming to put pt back to bed)  PT - Assessment/Plan  PT Plan Current plan remains appropriate  PT Frequency Min 3X/week  Follow Up Recommendations SNF  PT equipment None recommended by PT  PT Goal Progression  Progress towards PT goals Progressing toward goals  Acute Rehab PT Goals  Time For Goal Achievement 10/28/13  Potential to Achieve Goals Good  PT General Charges  $$ ACUTE PT VISIT 1 Procedure  PT Treatments  $Therapeutic Exercise 8-22 mins

## 2013-10-21 NOTE — Progress Notes (Signed)
Subjective: 1 Day Post-Op  Patient reports pain as mild.  Has pain just when weight bearing, made 3 steps to get to recliner. Better than expected with her injuries.   Objective: Vital signs in last 24 hours: Temp:  [97.6 F (36.4 C)-99.4 F (37.4 C)] 99.4 F (37.4 C) (11/17 0456) Pulse Rate:  [63-88] 75 (11/17 0456) Resp:  [14-18] 16 (11/17 0456) BP: (162-196)/(80-127) 165/96 mmHg (11/17 0456) SpO2:  [97 %-100 %] 100 % (11/17 0456) Weight:  [49.442 kg (109 lb)] 49.442 kg (109 lb) (11/17 0300)  Intake/Output from previous day: 11/16 0701 - 11/17 0700 In: 1410 [P.O.:360; I.V.:1050] Out: 1100 [Urine:1050; Blood:50] Intake/Output this shift:     Recent Labs  10/20/13 1400 10/21/13 0402  HGB 15.8* 15.6*    Recent Labs  10/20/13 1400 10/21/13 0402  WBC 9.2 9.1  RBC 4.78 4.71  HCT 45.9 45.7  PLT 159 149*    Recent Labs  10/20/13 1400 10/21/13 0402  NA 135 133*  K 3.7 4.0  CL 98 98  CO2 26 24  BUN 15 13  CREATININE 0.46* 0.52  GLUCOSE 105* 101*  CALCIUM 9.4 8.9    Recent Labs  10/20/13 1400  INR 1.00    Neurologically intact  Assessment/Plan: 1 Day Post-Op Procedure(s) (LRB): CANNULATED HIP PINNING (Right) Up with therapy  SNF  Arath Kaigler C 10/21/2013, 10:19 AM

## 2013-10-21 NOTE — Progress Notes (Addendum)
INITIAL NUTRITION ASSESSMENT  DOCUMENTATION CODES Per approved criteria  -Underweight   INTERVENTION: Encourage PO intake Provide Ensure Complete once daily RD to continue to monitor  NUTRITION DIAGNOSIS: Unintentional weight loss related to skipping meals as evidenced by 11% wt loss in the past year.    Goal: Pt to meet >/= 90% of their estimated nutrition needs   Monitor:  PO intake Weight Labs  Reason for Assessment: Malnutrition Screening Tool, score of 3  79 y.o. female  Admitting Dx: <principal problem not specified>  ASSESSMENT: 79 y.o. female with Hypertension not on medications for 3 years, PMR, came in after a mechanical fall. She was found to have right femur neck fracture. She was also found to have accelerated hypertension. Pt is now 1 day s/p percutaneous screw fixation of right nondisplaced femoral neck fracture.  Pt reports that she has gradually lost weight over the past year or so but, she doesn't know why. She reports that she used to weigh 125 lbs. She reports having a good appetite and eating a lot; but, sometimes if she doesn't feel hungry, she forgets to eat dinner. Pt reports eating well this morning. Encouraged pt to eat 3 meals daily, encouraged snacking to prevent further weight loss; discussed Ensure supplements and encouraged continued use after discharge if pt continues to lose weight. Weight history shows 4% weight loss in the past 8 months (not significant) and 11% wt loss in the past year (not significant).    Nutrition Focused Physical Exam:  Subcutaneous Fat:  Orbital Region: wnl Upper Arm Region: mild wasting Thoracic and Lumbar Region: NA  Muscle:  Temple Region: wnl Clavicle Bone Region: mild wasting Clavicle and Acromion Bone Region: mild wasting Scapular Bone Region: NA Dorsal Hand: wnl Patellar Region: moderate wasting Anterior Thigh Region: mild/moderate wasting Posterior Calf Region: wnl  Edema: none   Height: Ht Readings  from Last 1 Encounters:  10/21/13 5\' 5"  (1.651 m)    Weight: Wt Readings from Last 1 Encounters:  10/21/13 109 lb (49.442 kg)    Ideal Body Weight: 125 lbs  % Ideal Body Weight: 87%  Wt Readings from Last 10 Encounters:  10/21/13 109 lb (49.442 kg)  10/21/13 109 lb (49.442 kg)  02/26/13 114 lb 9.6 oz (51.982 kg)  10/02/12 122 lb (55.339 kg)  09/04/12 122 lb (55.339 kg)  03/29/12 120 lb 9.5 oz (54.7 kg)  03/29/12 120 lb 9.5 oz (54.7 kg)  03/21/12 116 lb (52.617 kg)  08/22/11 121 lb (54.885 kg)  08/01/11 119 lb (53.978 kg)    Usual Body Weight: 125 lbs  % Usual Body Weight: 87%  BMI:  Body mass index is 18.14 kg/(m^2). (underweight)  Estimated Nutritional Needs: Kcal: 1500-1700 Protein: 60-70 grams Fluid: 1.5-1.7 L/day  Skin: right hip incision  Diet Order: General  EDUCATION NEEDS: -No education needs identified at this time   Intake/Output Summary (Last 24 hours) at 10/21/13 1046 Last data filed at 10/21/13 0457  Gross per 24 hour  Intake   1410 ml  Output   1100 ml  Net    310 ml    Last BM: 11/15  Labs:   Recent Labs Lab 10/20/13 1400 10/21/13 0402  NA 135 133*  K 3.7 4.0  CL 98 98  CO2 26 24  BUN 15 13  CREATININE 0.46* 0.52  CALCIUM 9.4 8.9  GLUCOSE 105* 101*    CBG (last 3)  No results found for this basename: GLUCAP,  in the last 72 hours  Scheduled Meds: . aspirin EC  325 mg Oral Q breakfast  . docusate sodium  100 mg Oral BID  . metoprolol tartrate  25 mg Oral BID    Continuous Infusions: . sodium chloride    . sodium chloride 50 mL/hr at 10/20/13 1945    Past Medical History  Diagnosis Date  . Hypertension   . Osteoporosis   . PMR (polymyalgia rheumatica)   . Tobacco abuse   . Glucose intolerance (impaired glucose tolerance)   . Osteoarthritis   . Arthritis     "right shoulder; that's why I had to have it replaced"    Past Surgical History  Procedure Laterality Date  . Cataract extraction w/ intraocular lens   implant, bilateral      2008   . Posterior fusion lumbar spine  03/28/12  . Total shoulder replacement  06/2011    right  . Shoulder arthroscopy w/ rotator cuff repair  ~ 01/2011    right  . Vaginal hysterectomy  1970's  . Kyphoplasty N/A 02/28/2013    Procedure: KYPHOPLASTY;  Surgeon: Cristi Loron, MD;  Location: [facility];  Service: Neurosurgery;  Laterality: N/A;  Lumbar two Kyphoplasty    Ian Malkin RD, LDN Inpatient Clinical Dietitian Pager: [phone] After Hours Pager: [phone]

## 2013-10-21 NOTE — Progress Notes (Signed)
Clinical Social Work Department BRIEF PSYCHOSOCIAL ASSESSMENT 10/21/2013  Patient:  Shirley [name], Shirley [name]     Account Number:  [id number]     Admit date:  10/20/2013  Clinical Social Worker:  Candie Chroman  Date/Time:  10/21/2013 12:16 PM  Referred by:  Physician  Date Referred:  10/21/2013 Referred for  SNF Placement   Other Referral:   Interview type:  Patient Other interview type:    PSYCHOSOCIAL DATA Living Status:  HUSBAND Admitted from facility:   Level of care:   Primary support name:  Stephanie Acre Primary support relationship to patient:  SPOUSE Degree of support available:   limited support due to health issues    CURRENT CONCERNS Current Concerns  Post-Acute Placement   Other Concerns:    SOCIAL WORK ASSESSMENT / PLAN Pt is a 79 yr old female living at home prior to hospitalization. CSW met with pt / son to assist with d/c planning. Pt hospitalized due to hip fx. Surgery has been completed and ST Rehab is needed following hospital d/c. Pt / family are in agreement with d/c plan. SNF search has been initiated and bed offers will be provided as received.   Assessment/plan status:  Psychosocial Support/Ongoing Assessment of Needs Other assessment/ plan:   Information/referral to community resources:   Insurance coverage for SNF placement reviewed.    PATIENT'S/FAMILY'S RESPONSE TO PLAN OF CARE: Pt agrees with plan for short term rehab." I'd like to be close to home for my rehab. " Family states that spouse has his own health issues which limits his ability to assist at home.   Cori Razor LCSW [phone]

## 2013-10-21 NOTE — Progress Notes (Signed)
Occupational Therapy Treatment Patient Details Name: Shirley [name] MRN: [id number] DOB: 1934-04-26 Today's Date: 10/21/2013 Time: 5621-3086 OT Time Calculation (min): 13 min  OT Assessment / Plan / Recommendation  History of present illness community ambulator fell outside with right leg giving out and left superior and inferior pubic rami fracture and right nondisplaced femoral neck  Fracture.  Has had previous lumbar fusion Dr. Lovell Sheehan in March with some residual right leg numbness and weakness; s/p  right hip pinning and with left pubic rami fxs   OT comments  Patient having difficulty with TDWB during transfer back to bed.  Follow Up Recommendations  SNF    Barriers to Discharge  Decreased caregiver support    Equipment Recommendations  Other (comment) (tbd at SNF)    Recommendations for Other Services    Frequency Min 2X/week   Progress towards OT Goals    Plan      Precautions / Restrictions Precautions Precautions: Fall Restrictions Weight Bearing Restrictions: Yes RLE Weight Bearing: Touchdown weight bearing LLE Weight Bearing: Weight bearing as tolerated   Pertinent Vitals/Pain     ADL  Eating/Feeding: Set up Where Assessed - Eating/Feeding: Chair Grooming: Set up Where Assessed - Grooming: Supported sitting Lower Body Bathing: Simulated;Moderate assistance Lower Body Dressing: Simulated;Moderate assistance Toilet Transfer: Simulated;Minimal assistance Toilet Transfer Method: Sit to stand;Stand pivot Toilet Transfer Equipment: Bedside commode Toileting - Clothing Manipulation and Hygiene: Maximal assistance Where Assessed - Toileting Clothing Manipulation and Hygiene: Sit to stand from 3-in-1 or toilet Transfers/Ambulation Related to ADLs: Patient transferred from recliner to bed with min A. Mod A sit to supine ADL Comments: Difficulty with maintaining TDWB RLE    OT Diagnosis: Generalized weakness;Acute pain  OT Problem List: Decreased  strength;Decreased activity tolerance;Decreased knowledge of use of DME or AE;Decreased safety awareness;Decreased knowledge of precautions;Pain OT Treatment Interventions: Self-care/ADL training;DME and/or AE instruction;Therapeutic activities;Patient/family education   OT Goals(current goals can now be found in the care plan section) Acute Rehab OT Goals Patient Stated Goal: walk OT Goal Formulation: With patient Time For Goal Achievement: 11/04/13 Potential to Achieve Goals: Good  Visit Information  Last OT Received On: 10/21/13 Assistance Needed: +1 History of Present Illness: community ambulator fell outside with right leg giving out and left superior and inferior pubic rami fracture and right nondisplaced femoral neck  Fracture.  Has had previous lumbar fusion Dr. Lovell Sheehan in March with some residual right leg numbness and weakness; s/p  right hip pinning and with left pubic rami fxs    Subjective Data      Prior Functioning  Home Living Family/patient expects to be discharged to:: Skilled nursing facility Living Arrangements: Spouse/significant other Available Help at Discharge: Family Type of Home: House Home Layout: Two level;Multi-level;Able to live on main level with bedroom/bathroom Home Equipment: Dan Humphreys - 2 wheels;Gilmer Mor - single point Additional Comments: husband had ?subdural hematoma around April and needs supervision; pt states he "comes and goes" Prior Function Level of Independence: Independent Communication Communication: No difficulties Dominant Hand: Right    Cognition  Cognition Arousal/Alertness: Awake/alert Behavior During Therapy: WFL for tasks assessed/performed Overall Cognitive Status: Within Functional Limits for tasks assessed    Mobility  Bed Mobility Bed Mobility: Supine to Sit Supine to Sit: 1: +2 Total assist Supine to Sit: Patient Percentage: 50% Details for Bed Mobility Assistance: cues for technique and UE self assist; +2 for trunk to  full upright and assist wtih bil LEs for pain control Transfers Sit to Stand: 3: Mod assist;With upper  extremity assist Stand to Sit: 3: Mod assist;With upper extremity assist Details for Transfer Assistance: verbal cues for hand placement and safe technique    Exercises      Balance     End of Session OT - End of Session Equipment Utilized During Treatment: Gait belt;Rolling walker Activity Tolerance: Patient tolerated treatment well;Patient limited by fatigue Patient left: in bed;with call bell/phone within reach Nurse Communication: Mobility status  GO     Tylar Merendino A 10/21/2013, 4:00 PM

## 2013-10-21 NOTE — Progress Notes (Signed)
Pt was found during shift change with her catheter out. Pt states she became confused and just wanted everything off of her. Used BSC and voided less than 25 mls. Will continue to monitor.

## 2013-10-22 ENCOUNTER — Encounter (HOSPITAL_COMMUNITY): Payer: Self-pay | Admitting: Orthopaedic Surgery

## 2013-10-22 DIAGNOSIS — S72009A Fracture of unspecified part of neck of unspecified femur, initial encounter for closed fracture: Principal | ICD-10-CM

## 2013-10-22 DIAGNOSIS — F17200 Nicotine dependence, unspecified, uncomplicated: Secondary | ICD-10-CM

## 2013-10-22 DIAGNOSIS — I10 Essential (primary) hypertension: Secondary | ICD-10-CM

## 2013-10-22 LAB — URINE CULTURE: Culture: NO GROWTH

## 2013-10-22 LAB — BASIC METABOLIC PANEL
BUN: 16 mg/dL (ref 6–23)
CO2: 29 mEq/L (ref 19–32)
Chloride: 99 mEq/L (ref 96–112)
Creatinine, Ser: 0.71 mg/dL (ref 0.50–1.10)
GFR calc Af Amer: >90 mL/min (ref >90)
Glucose, Bld: 108 mg/dL — ABNORMAL HIGH (ref 70–99)
Potassium: 3.6 mEq/L (ref 3.5–5.1)

## 2013-10-22 LAB — CBC
HCT: 44 % (ref 36.0–46.0)
Hemoglobin: 15 g/dL (ref 12.0–15.0)
MCV: 98 fL (ref 78.0–100.0)
RBC: 4.49 MIL/uL (ref 3.87–5.11)
RDW: 12.8 % (ref 11.5–15.5)
WBC: 7.2 10*3/uL (ref 4.0–10.5)

## 2013-10-22 MED ORDER — THIAMINE HCL 100 MG PO TABS: 100.0000 mg | ORAL_TABLET | Freq: Every day | ORAL | Status: DC

## 2013-10-22 MED ORDER — VITAMIN B-1 100 MG PO TABS
100.0000 mg | ORAL_TABLET | Freq: Every day | ORAL | Status: DC
  Administered 2013-10-22: 10:00:00 100 mg via ORAL
  Filled 2013-10-22: qty 1

## 2013-10-22 MED ORDER — DSS 100 MG PO CAPS: 100.0000 mg | ORAL_CAPSULE | Freq: Two times a day (BID) | ORAL | Status: DC

## 2013-10-22 MED ORDER — LORAZEPAM 2 MG/ML IJ SOLN: 1.0000 mg | Freq: Four times a day (QID) | INTRAMUSCULAR | Status: DC | PRN

## 2013-10-22 MED ORDER — LORAZEPAM 0.5 MG PO TABS: 0.5000 mg | ORAL_TABLET | ORAL | Status: DC | PRN

## 2013-10-22 MED ORDER — ENSURE COMPLETE PO LIQD: 237.0000 mL | ORAL | Status: DC

## 2013-10-22 MED ORDER — LORAZEPAM 0.5 MG PO TABS
0.5000 mg | ORAL_TABLET | ORAL | Status: DC | PRN
  Administered 2013-10-22: 14:00:00 0.5 mg via ORAL
  Filled 2013-10-22: qty 1

## 2013-10-22 MED ORDER — LORAZEPAM 1 MG PO TABS: 0.0000 mg | ORAL_TABLET | Freq: Two times a day (BID) | ORAL | Status: DC

## 2013-10-22 MED ORDER — ADULT MULTIVITAMIN W/MINERALS CH: 1.0000 | ORAL_TABLET | Freq: Every day | ORAL | Status: DC

## 2013-10-22 MED ORDER — THIAMINE HCL 100 MG/ML IJ SOLN
100.0000 mg | Freq: Every day | INTRAMUSCULAR | Status: DC
  Filled 2013-10-22: qty 1

## 2013-10-22 MED ORDER — FOLIC ACID 1 MG PO TABS: 1.0000 mg | ORAL_TABLET | Freq: Every day | ORAL | Status: DC

## 2013-10-22 MED ORDER — FOLIC ACID 1 MG PO TABS
1.0000 mg | ORAL_TABLET | Freq: Every day | ORAL | Status: DC
  Administered 2013-10-22: 10:00:00 1 mg via ORAL
  Filled 2013-10-22: qty 1

## 2013-10-22 MED ORDER — METOPROLOL TARTRATE 25 MG PO TABS: 25.0000 mg | ORAL_TABLET | Freq: Two times a day (BID) | ORAL | Status: DC

## 2013-10-22 MED ORDER — LORAZEPAM 1 MG PO TABS
0.0000 mg | ORAL_TABLET | Freq: Four times a day (QID) | ORAL | Status: DC
  Administered 2013-10-22: 09:00:00 1 mg via ORAL
  Filled 2013-10-22: qty 1

## 2013-10-22 MED ORDER — ADULT MULTIVITAMIN W/MINERALS CH
1.0000 | ORAL_TABLET | Freq: Every day | ORAL | Status: DC
  Administered 2013-10-22: 10:00:00 1 via ORAL
  Filled 2013-10-22: qty 1

## 2013-10-22 MED ORDER — LORAZEPAM 1 MG PO TABS: 1.0000 mg | ORAL_TABLET | Freq: Four times a day (QID) | ORAL | Status: DC | PRN

## 2013-10-22 NOTE — Progress Notes (Signed)
Rescheduled labs for 0700. Pt resting comfortably after being restless for most of the night.

## 2013-10-22 NOTE — Discharge Summary (Addendum)
Physician Discharge Summary  Shirley [name] OZH:[id number] DOB: [birth year] DOA: 10/20/2013  PCP: Shirley Georges, MD  Admit date: 10/20/2013 Discharge date: 10/22/2013  Recommendations for Outpatient Follow-up:  1. Monitor blood pressure 2. Monitor for possible alcohol withdrawal 3. Adjust blood pressure meds as needed  4. Check BMP in 1 week  Discharge Diagnoses:  Patient Active Problem List   Diagnosis Date Noted  . Lumbar degenerative disc disease 08/22/2011  . EXTRINSIC ASTHMA, WITH EXACERBATION 11/03/2009  . GLUCOSE INTOLERANCE 06/09/2008  . TOBACCO ABUSE 05/16/2008  . POLYMYALGIA RHEUMATICA 05/16/2008  . HYPERTENSION 03/12/2008  . OSTEOARTHRITIS 03/12/2008  . MYALGIA 03/12/2008  . OSTEOPOROSIS 03/12/2008       Regular alcohol consumption (daily)  Discharge Condition: stable  Diet recommendation: heart healthy  Filed Weights   10/21/13 0300  Weight: 109 lb (49.442 kg)    History of present illness:  Shirley [name] is a 79 y.o. female with Hypertension not on medications for 3 years, PMR, came in after a mechanical fall. She was found to have right femur neck fracture. She was also found to have accelerated hypertension. She denies any other complaints other than pain in the right femur. Her labs are unremarkable. UA is pending. She is referred to medical service with orthopedics consulting for surgery tonight.   Hospital Course:  1. Postop day 1 s/p right cannulated hip pinning - pain controlled, up with therapy, SNF when bed available 2. Hypertension - much better controlled with metoprolol 25 mg po BID 3. Possible alcohol withdrawal vs acute delerium - pt was given ativan prn and symptoms improved.  Pt was started on MVI, thiamine and folic acid.  Encouraged continued monitoring of patient for withdrawal symptoms.  4. Tobacco user -The patient was counseled on the dangers of tobacco use, and was advised to quit.  Reviewed strategies to maximize success, including  removing cigarettes and smoking materials from environment, stress management, support of family/friends and written materials.   Procedures: Preop diagnosis: Right nondisplaced femoral neck fracture, left superior and inferior pubic rami fracture.  Postop diagnosis: Same  Procedure: Percutaneous screw fixation of right nondisplaced femoral neck fracture. Synthes cannulated screws 7 mm x3.  Surgeon: Shirley [name] M.D.  Anesthesia: Gen. plus Marcaine skin local 10 cc  EBL: Minimal  Complications: None   Consultations:  Orthopedics Dr. Ophelia [name]   Discharge Exam: Pt is awake, alert and oriented after resting well s/p lorazepam  Filed Vitals:   10/22/13 0709  BP: 129/85  Pulse: 74  Temp: 97.3 F (36.3 [name])  Resp: 16   General: awake, alert, no distress Cardiovascular: normal s1, s2 sounds  Respiratory: BBS clear to ausculation   Discharge Instructions  Discharge Orders   Future Orders Complete By Expires   Diet - low sodium heart healthy  As directed    Discharge instructions  As directed    Comments:     Touch Down weight bearing Resume Physical Therapy Return if symptoms recur, worsen or new problems develop   Increase activity slowly  As directed    Touch down weight bearing  As directed    Scheduling Instructions:     TDWB right femoral neck pinning for 6 wks.  Will be WBAT for left LE with superior and inferior rami fractures, likely transfers only on Left LE for a few weeks until rami fractures get solid then she should be able to WBAT left LE and still will be TDWB on right LE for 6 wks.   Questions:  Laterality:  right   Extremity:  Lower       Medication List         aspirin 325 MG tablet  Commonly known as:  BAYER ASPIRIN  Take 1 tablet (325 mg total) by mouth daily.     DSS 100 MG Caps  Take 100 mg by mouth 2 (two) times daily.     feeding supplement (ENSURE COMPLETE) Liqd  Take 237 mLs by mouth daily.     folic acid 1 MG tablet  Commonly known as:   FOLVITE  Take 1 tablet (1 mg total) by mouth daily.     HYDROcodone-acetaminophen 5-325 MG per tablet  Commonly known as:  NORCO/VICODIN  Take 1 tablet by mouth every 4 (four) hours as needed for moderate pain.     LORazepam 0.5 MG tablet  Commonly known as:  ATIVAN  Take 1 tablet (0.5 mg total) by mouth every 4 (four) hours as needed for anxiety (agitation).     metoprolol tartrate 25 MG tablet  Commonly known as:  LOPRESSOR  Take 1 tablet (25 mg total) by mouth 2 (two) times daily.     multivitamin with minerals Tabs tablet  Take 1 tablet by mouth daily.     thiamine 100 MG tablet  Take 1 tablet (100 mg total) by mouth daily.       No Known Allergies     Follow-up Information   Follow up with Shirley C, MD In 2 weeks.   Specialty:  Orthopedic Surgery   Contact information:   [address] [place] [address] [phone]       Follow up with Shirley Manges, MD. Schedule an appointment as soon as possible for a visit in 2 weeks.   Specialty:  Orthopedic Surgery   Contact information:   [address] [place] [address] [phone]       Follow up with Shirley ALLEN, MD. Schedule an appointment as soon as possible for a visit in 2 weeks.   Specialty:  Family Medicine   Contact information:   [address] [place] [address] [place] [address] [phone]      The results of significant diagnostics from this hospitalization (including imaging, microbiology, ancillary and laboratory) are listed below for reference.    Significant Diagnostic Studies: Dg Elbow Complete Right  10/20/2013   CLINICAL DATA:  Patient fell yesterday with pain.  EXAM: RIGHT ELBOW - COMPLETE 3+ VIEW  COMPARISON:  None.  FINDINGS: The right elbow demonstrates no fracture or dislocation. There is a well corticated ossific fragment adjacent to the lateral epicondyle likely representing sequela of prior avulsive injury. There is no significant joint effusion. There is a soft  tissue laceration overlying the olecranon process.  IMPRESSION: No acute osseous injury of the right elbow.   Electronically Signed   By: Elige Ko   On: 10/20/2013 13:09   Dg Hip Complete Right  10/20/2013   CLINICAL DATA:  Patient fell yesterday her in the right hip  EXAM: RIGHT HIP - COMPLETE 2+ VIEW  COMPARISON:  None.  FINDINGS: There is a nondisplaced right femoral neck fracture. There is no dislocation. There are minimally displaced left superior and inferior pubic rami fractures. There is no left femoral fracture.  IMPRESSION: 1.  Nondisplaced right femoral neck fracture.  2. Minimally displaced left superior and inferior pubic rami fractures.   Electronically Signed   By: Elige Ko   On: 10/20/2013 13:08   Dg Hip Operative Right  10/20/2013   CLINICAL DATA:  Right femoral neck fracture.  EXAM: DG OPERATIVE RIGHT HIP  TECHNIQUE: A single spot fluoroscopic AP image of the right hip is submitted.  COMPARISON:  Radiographs dated 10/20/2013  FINDINGS: [name]-arm images demonstrate that 3 pins have been placed across the right femoral neck fracture. Alignment of the fracture fragments is essentially anatomic.  IMPRESSION: Open reduction and internal fixation of right femoral neck fracture.   Electronically Signed   By: Geanie Cooley M.D.   On: 10/20/2013 20:14   Dg Chest Port 1 View  10/20/2013   CLINICAL DATA:  HIP FRACTURE, PREOPERATIVE. HYPERTENSION. TOBACCO USE.  EXAM: PORTABLE CHEST - 1 VIEW  COMPARISON:  02/28/2013  FINDINGS: Emphysema noted. Leftward convexity of the descending thoracic aorta contour, likely due to tortuosity. Possible interval compression fracture at t12-consider lateral radiography if feasible.  Right shoulder prosthesis.  IMPRESSION: 1. T12 vertebral body may have mildly intervally compressed since march. Consider lateral thoracic or lumbar spine radiography for further characterization, if clinically warranted. 2. emphysema. 3. Leftward convexity of the descending thoracic  aorta contour, likely due to tortuosity.   Electronically Signed   By: Herbie Baltimore M.D.   On: 10/20/2013 17:24   Microbiology: Recent Results (from the past 240 hour(s))  URINE CULTURE     Status: None   Collection Time    10/21/13  3:24 AM      Result Value Range Status   Specimen Description URINE, CLEAN CATCH   Final   Special Requests NONE   Final   Culture  Setup Time     Final   Value: 10/21/2013 09:45     Performed at [facility]   Culture     Final   Value: NO GROWTH     Performed at [facility]   Report Status 10/22/2013 FINAL   Final    Labs: Basic Metabolic Panel:  Recent Labs Lab 10/20/13 1400 10/21/13 0402 10/22/13 0641  NA 135 133* 134*  K 3.7 4.0 3.6  CL 98 98 99  CO2 26 24 29   GLUCOSE 105* 101* 108*  BUN 15 13 16   CREATININE 0.46* 0.52 0.71  CALCIUM 9.4 8.9 8.8   Liver Function Tests: No results found for this basename: AST, ALT, ALKPHOS, BILITOT, PROT, ALBUMIN,  in the last 168 hours No results found for this basename: LIPASE, AMYLASE,  in the last 168 hours No results found for this basename: AMMONIA,  in the last 168 hours CBC:  Recent Labs Lab 10/20/13 1400 10/21/13 0402 10/22/13 0641  WBC 9.2 9.1 7.2  NEUTROABS 6.8  --   --   HGB 15.8* 15.6* 15.0  HCT 45.9 45.7 44.0  MCV 96.0 97.0 98.0  PLT 159 149* 138*   Cardiac Enzymes: No results found for this basename: CKTOTAL, CKMB, CKMBINDEX, TROPONINI,  in the last 168 hours BNP: BNP (last 3 results) No results found for this basename: PROBNP,  in the last 8760 hours CBG: No results found for this basename: GLUCAP,  in the last 168 hours  Signed:  Clanford Johnson  [facility] 10/22/2013, 11:06 AM

## 2013-10-22 NOTE — Progress Notes (Signed)
Pt.  found in floor beside her bed in her room. Pt. was assisted to her feet by EMT & nurse. Pt. was able to stand while vital signs were taken. T=98.0 Bp=153/94 p=93 R= 20 O2 sat =96%  Dressing over rt. hip incision is dry & intact.No injury from fall was noted. Hospitalist Dr. Laural Benes was notified & Dr. Ophelia Charter nurse was notified. I spoke with pt.'s son Tawanna Cooler so he would be aware of fall. Pt. assisted to stretcher & was transported to [facility] .

## 2013-10-22 NOTE — Care Management Note (Signed)
    Page 1 of 1   10/22/2013     12:56:11 PM   CARE MANAGEMENT NOTE 10/22/2013  Patient:  Shirley [name], Shirley [name]   Account Number:  [id number]  Date Initiated:  10/22/2013  Documentation initiated by:  Colleen Can  Subjective/Objective Assessment:   dx rt hip fracture; rt hip pinning     Action/Plan:   Pt plans SNF rehab   Anticipated DC Date:  10/22/2013   Anticipated DC Plan:  SKILLED NURSING FACILITY  In-house referral  Clinical Social Worker      DC Planning Services  CM consult      Choice offered to / List presented to:             Status of service:  Completed, signed off Medicare Important Message given?  NA - LOS <3 / Initial given by admissions (If response is "NO", the following Medicare IM given date fields will be blank) Date Medicare IM given:   Date Additional Medicare IM given:    Discharge Disposition:  SKILLED NURSING FACILITY  Per UR Regulation:  Reviewed for med. necessity/level of care/duration of stay  If discussed at Long Length of Stay Meetings, dates discussed:    Comments:

## 2013-10-22 NOTE — Progress Notes (Signed)
Subjective: 2 Days Post-Op Procedure(s) (LRB): CANNULATED HIP PINNING (Right) Patient reports pain as mild.   Patient with confusion, pulled foley out with balloon up, IV pulled out times 2. Voided since foley pulled out by patient.  Drinks wine daily.   Objective: Vital signs in last 24 hours: Temp:  [97.3 F (36.3 C)-100.1 F (37.8 C)] 97.3 F (36.3 C) (11/18 0709) Pulse Rate:  [69-90] 74 (11/18 0709) Resp:  [16-18] 16 (11/18 0709) BP: (129-177)/(74-89) 129/85 mmHg (11/18 0709) SpO2:  [96 %-99 %] 97 % (11/18 0709)  Intake/Output from previous day: 11/17 0701 - 11/18 0700 In: 1200 [P.O.:600; I.V.:600] Out: 1600 [Urine:1600] Intake/Output this shift:     Recent Labs  10/20/13 1400 10/21/13 0402 10/22/13 0641  HGB 15.8* 15.6* 15.0    Recent Labs  10/21/13 0402 10/22/13 0641  WBC 9.1 7.2  RBC 4.71 4.49  HCT 45.7 44.0  PLT 149* 138*    Recent Labs  10/21/13 0402 10/22/13 0641  NA 133* 134*  K 4.0 3.6  CL 98 99  CO2 24 29  BUN 13 16  CREATININE 0.52 0.71  GLUCOSE 101* 108*  CALCIUM 8.9 8.8    Recent Labs  10/20/13 1400  INR 1.00    Neurologically intact  Assessment/Plan: 2 Days Post-Op Procedure(s) (LRB): CANNULATED HIP PINNING (Right) Discharge to SNF  Ativan EtOH withdrawal protocol.  She admits to drinking heavy at times in the past, her symptoms suggest DT's.  Has voided since traumatic foley self removal.   Ciearra Rufo C 10/22/2013, 7:46 AM

## 2013-10-23 NOTE — Progress Notes (Signed)
Clinical Social Work Department CLINICAL SOCIAL WORK PLACEMENT NOTE 10/23/2013  Patient:  Shirley [name], Shirley [name]  Account Number:  [id number] Admit date:  10/20/2013  Clinical Social Worker:  Cori Razor, LCSW  Date/time:  10/21/2013 12:39 PM  Clinical Social Work is seeking post-discharge placement for this patient at the following level of care:   SKILLED NURSING   (*CSW will update this form in Epic as items are completed)   10/21/2013  Patient/family provided with [facility] Department of Clinical Social Work's list of facilities offering this level of care within the geographic area requested by the patient (or if unable, by the patient's family).  10/21/2013  Patient/family informed of their freedom to choose among providers that offer the needed level of care, that participate in Medicare, Medicaid or managed care program needed by the patient, have an available bed and are willing to accept the patient.    Patient/family informed of [facility]' ownership interest in [facility], as well as of the fact that they are under no obligation to receive care at this facility.  PASARR submitted to EDS on 10/21/2013 PASARR number received from EDS on 10/21/2013  FL2 transmitted to all facilities in geographic area requested by pt/family on  10/21/2013 FL2 transmitted to all facilities within larger geographic area on   Patient informed that his/her managed care company has contracts with or will negotiate with  certain facilities, including the following:     Patient/family informed of bed offers received:  10/21/2013 Patient chooses bed at [facility] AND [facility] Physician recommends and patient chooses bed at    Patient to be transferred to [facility] on  10/22/2013 Patient to be transferred to facility by [facility]  The following physician request were entered in Epic:   Additional Comments:  Cori Razor LCSW [phone]

## 2013-12-23 ENCOUNTER — Other Ambulatory Visit: Payer: Self-pay | Admitting: Family Medicine

## 2013-12-26 ENCOUNTER — Telehealth: Payer: Self-pay | Admitting: Family Medicine

## 2013-12-26 MED ORDER — METOPROLOL TARTRATE 25 MG PO TABS: 25.0000 mg | ORAL_TABLET | Freq: Two times a day (BID) | ORAL | Status: DC

## 2013-12-26 NOTE — Telephone Encounter (Signed)
Left detailed message for pt Rx sent to pharmacy and called Nehemiah SettleBrooke and told her Rx sent to pharmacy. Brooke verbalized understanding.

## 2013-12-26 NOTE — Telephone Encounter (Signed)
Pt's BP is 160/80 and got as high as 170/100.  Pt needs another re-fill of metoprolol tartrate (LOPRESSOR) 25 MG tablet and send RX to [facility] [place]. Pt is completely out.  Nehemiah SettleBrooke would like a call letting her know when done so she can communicate with pt.

## 2013-12-30 ENCOUNTER — Encounter: Payer: Self-pay | Admitting: Family Medicine

## 2013-12-30 ENCOUNTER — Ambulatory Visit (INDEPENDENT_AMBULATORY_CARE_PROVIDER_SITE_OTHER): Payer: Medicare Other | Admitting: Family Medicine

## 2013-12-30 VITALS — BP 184/100 | Temp 98.3°F | Ht 65.0 in | Wt 103.0 lb

## 2013-12-30 DIAGNOSIS — R413 Other amnesia: Secondary | ICD-10-CM | POA: Insufficient documentation

## 2013-12-30 DIAGNOSIS — I10 Essential (primary) hypertension: Secondary | ICD-10-CM

## 2013-12-30 DIAGNOSIS — F17200 Nicotine dependence, unspecified, uncomplicated: Secondary | ICD-10-CM

## 2013-12-30 DIAGNOSIS — M1990 Unspecified osteoarthritis, unspecified site: Secondary | ICD-10-CM

## 2013-12-30 DIAGNOSIS — M353 Polymyalgia rheumatica: Secondary | ICD-10-CM

## 2013-12-30 NOTE — Progress Notes (Signed)
   Subjective:    Patient ID: Shirley BealBarbara B Leisner, female    DOB: 1933-12-31, 80 y.o.   MRN: [id number]  HPI Britta MccreedyBarbara is a 80 year old married female smoker......... 3 cigarettes a day........ who comes in today following a hospitalization November 16 2 November 18 following a fall at home which resulted in a right femur fracture.  At that time they were concerned about alcohol consumption however no blood alcohol level was ever done. She was discharged on multivitamins and thiamine however she says she stopped drinking completely in October.  She does not smoke at all in the hospital or rehabilitation but now started to smoke again. In the past we've given her Ativan to take 0.5 mg 3 times daily for anxiety in lieu of smoking.  She takes a beta blocker 25 mg twice a day for hypertension BP today 120/80,,,,,,, too low we'll decrease dose to once daily  She's walking at home with a walker and has home PT. She's followed by her surgeon Dr. Annell GreeningMark Yates. He is riding her pain medication..   Review of Systems Review of systems otherwise negative except for husband is also having difficulty     Objective:   Physical Exam  Well-developed well-nourished thin female no acute distress vital signs weight 103 BP 120/80 left arm sitting position she walks slowly with a walker      Assessment & plan status post fracture right femur  Status post fracture right femur............ followed by Dr. Ophelia CharterYates  Tobacco abuse again encouraged to stop smoking Ativan 0.5 3 times a day  Stopped the multivitamins and Ensure thiamine and folic acid return when necessary  And the the office visit her daughter came in and expressed a concern about his mother short-term memory loss. Her daughter feels she can remember long term very well but his is beginning to lose her short-term memory.,,,,,,,,,, neurologic consult

## 2013-12-30 NOTE — Patient Instructions (Signed)
Metoprolol 25 mg............ one tablet daily in the morning  Continue home PT  Stop  the vitamins thiamine folic acid and nutritional supplements  Return in 2 months for followup sooner if any problems

## 2013-12-30 NOTE — Progress Notes (Signed)
Pre visit review using our clinic review tool, if applicable. No additional management support is needed unless otherwise documented below in the visit note. 

## 2014-01-08 ENCOUNTER — Telehealth: Payer: Self-pay | Admitting: Family Medicine

## 2014-01-08 NOTE — Telephone Encounter (Signed)
Relevant patient education mailed to patient.  

## 2014-01-29 ENCOUNTER — Telehealth: Payer: Self-pay | Admitting: Neurology

## 2014-01-29 ENCOUNTER — Ambulatory Visit: Payer: Medicare Other | Admitting: Neurology

## 2014-01-29 ENCOUNTER — Encounter: Payer: Self-pay | Admitting: Neurology

## 2014-01-29 NOTE — Telephone Encounter (Signed)
error 

## 2014-02-01 ENCOUNTER — Other Ambulatory Visit: Payer: Self-pay | Admitting: Family Medicine

## 2014-02-26 ENCOUNTER — Ambulatory Visit: Payer: Medicare Other | Admitting: Neurology

## 2014-03-03 ENCOUNTER — Ambulatory Visit: Payer: Medicare Other | Admitting: Family Medicine

## 2014-03-06 ENCOUNTER — Ambulatory Visit (INDEPENDENT_AMBULATORY_CARE_PROVIDER_SITE_OTHER): Payer: Medicare Other | Admitting: Neurology

## 2014-03-06 ENCOUNTER — Encounter: Payer: Self-pay | Admitting: Neurology

## 2014-03-06 VITALS — BP 170/100 | HR 69 | Ht 63.78 in | Wt 106.1 lb

## 2014-03-06 DIAGNOSIS — F411 Generalized anxiety disorder: Secondary | ICD-10-CM | POA: Insufficient documentation

## 2014-03-06 DIAGNOSIS — R413 Other amnesia: Secondary | ICD-10-CM

## 2014-03-06 LAB — VITAMIN B12: Vitamin B-12: 442 pg/mL (ref 211–911)

## 2014-03-06 LAB — TSH: TSH: 1.027 u[IU]/mL (ref 0.350–4.500)

## 2014-03-06 MED ORDER — CITALOPRAM HYDROBROMIDE 10 MG PO TABS: 10.0000 mg | ORAL_TABLET | Freq: Every day | ORAL | Status: DC

## 2014-03-06 NOTE — Patient Instructions (Addendum)
1. Bloodwork for TSH, vitamin B12 2. Start Celexa 10mg  daily for anxiety, then discuss further increase with Dr. Tawanna Coolerodd 3. Follow-up in 6 months

## 2014-03-06 NOTE — Progress Notes (Signed)
NEUROLOGY CONSULTATION NOTE  Shirley [name] MRN: [id number] DOB: [birth year]  Referring provider: Dr. Kelle DartingJeffrey Todd Primary care provider: Dr. Kelle DartingJeffrey Todd  Reason for consult:  Memory loss  Dear Dr Tawanna Coolerodd:  Thank you for your kind referral of Shirley [name] for consultation of the above symptoms. Although her history is well known to you, please allow me to reiterate it for the purpose of our medical record. Records and images were personally reviewed where available.  HISTORY OF PRESENT ILLNESS: This is a very pleasant 80 year old right-handed woman presenting with memory changes that she started to notice after she fell in November 2014 and sustained a right femur fracture.  She reports that she has "never been someone who can remember things," however over the past few months, she feels that "there are a lot of things I can't pull up."  She has always been a note-taker, but reports that it takes her a longer time to go through things in her head now.  She states that it is "not bad, just insignificant things," but has her worried.  She would occasionally misplace things, but does not get turned around in her home.  She lives with her husband and denies being told she repeats things excessively.  She occasionally forgets details of conversations.  She is in-charge of their financial matters, and does not have any difficulties with these.  She does not forget to take her medications.  She has occasional word-finding difficulties, and notices that when she reads, she has to go back to the beginning of a sentence.  She is slower to follow instructions.  She continues to cook and has not left the stove on.  She continues to drive and does not get lost or confused.  She has no difficulties performing ADLs.  There is no family history of dementia.  She denies any headaches, dizziness, diplopia, dysarthria, dysphagia, neck pain, tremor, bowel/bladder dysfunction.  She denies any gaps in time,  staring/unresponsive episodes, myoclonic jerks.  She feels that since the right femur fracture, he right leg has been weaker.  She drinks around 7 glasses of wine/week.  She smokes around 3 cigarettes a day.    I personally reviewed MRI brain done 01/2013 which showed diffuse global volume loss with enlarged ventricles, scattered T2 FLAIR hyperintensities. No acute abnormalities seen.  PAST MEDICAL HISTORY: Past Medical History  Diagnosis Date  . Hypertension   . Osteoporosis   . PMR (polymyalgia rheumatica)   . Tobacco abuse   . Glucose intolerance (impaired glucose tolerance)   . Osteoarthritis   . Arthritis     "right shoulder; that's why I had to have it replaced"    PAST SURGICAL HISTORY: Past Surgical History  Procedure Laterality Date  . Cataract extraction w/ intraocular lens  implant, bilateral      2008   . Posterior fusion lumbar spine  03/28/12  . Total shoulder replacement  06/2011    right  . Shoulder arthroscopy w/ rotator cuff repair  ~ 01/2011    right  . Vaginal hysterectomy  1970's  . Kyphoplasty N/A 02/28/2013    Procedure: KYPHOPLASTY;  Surgeon: Cristi LoronJeffrey D Jenkins, MD;  Location: [facility];  Service: Neurosurgery;  Laterality: N/A;  Lumbar two Kyphoplasty  . Hip pinning,cannulated Right 10/20/2013    Procedure: CANNULATED HIP PINNING;  Surgeon: Eldred MangesMark C Yates, MD;  Location: [facility];  Service: Orthopedics;  Laterality: Right;    MEDICATIONS: Current Outpatient Prescriptions on File Prior  to Visit  Medication Sig Dispense Refill  . feeding supplement, ENSURE COMPLETE, (ENSURE COMPLETE) LIQD Take 237 mLs by mouth daily.      . folic acid (FOLVITE) 1 MG tablet Take 1 tablet (1 mg total) by mouth daily.      . metoprolol tartrate (LOPRESSOR) 25 MG tablet TAKE 1 TABLET (25 MG TOTAL) BY MOUTH 2 (TWO) TIMES DAILY.  60 tablet  0  . Multiple Vitamin (MULTIVITAMIN WITH MINERALS) TABS tablet Take 1 tablet by mouth daily.      Marland Kitchen thiamine 100 MG tablet Take 1 tablet (100 mg  total) by mouth daily.       No current facility-administered medications on file prior to visit.    ALLERGIES: No Known Allergies  FAMILY HISTORY: Family History  Problem Relation Age of Onset  . ALS Father   . Hypertension Sister     SOCIAL HISTORY: History   Social History  . Marital Status: Married    Spouse Name: N/A    Number of Children: 3  . Years of Education: N/A   Occupational History  . retired    Social History Main Topics  . Smoking status: Current Every Day Smoker -- 0.50 packs/day for 40 years    Types: Cigarettes    Last Attempt to Quit: 12/06/1991  . Smokeless tobacco: Never Used  . Alcohol Use: 8.4 oz/week    14 Glasses of wine per week  . Drug Use: No  . Sexual Activity: No   Other Topics Concern  . Not on file   Social History Narrative   Retired   Married   Alcohol use-no   Drug use-no   Regular Exercise-yes   Current Smoker          REVIEW OF SYSTEMS: Constitutional: No fevers, chills, or sweats, no generalized fatigue, change in appetite Eyes: No visual changes, double vision, eye pain Ear, nose and throat: No hearing loss, ear pain, nasal congestion, sore throat Cardiovascular: No chest pain, palpitations Respiratory:  No shortness of breath at rest or with exertion, wheezes GastrointestinaI: No nausea, vomiting, diarrhea, abdominal pain, fecal incontinence Genitourinary:  No dysuria, urinary retention or frequency Musculoskeletal:  No neck pain, back pain Integumentary: No rash, pruritus, skin lesions Neurological: as above Psychiatric: No depression, insomnia, anxiety Endocrine: No palpitations, fatigue, diaphoresis, mood swings, change in appetite, change in weight, increased thirst Hematologic/Lymphatic:  No anemia, purpura, petechiae. Allergic/Immunologic: no itchy/runny eyes, nasal congestion, recent allergic reactions, rashes  PHYSICAL EXAM: Filed Vitals:   03/06/14 1045  BP: 170/100  Pulse: 69   General: No  acute distress Head:  Normocephalic/atraumatic Neck: supple, no paraspinal tenderness, full range of motion Back: No paraspinal tenderness Heart: regular rate and rhythm Lungs: Clear to auscultation bilaterally. Vascular: No carotid bruits. Skin/Extremities: No rash, no edema Neurological Exam: Mental status: alert and oriented to person, place, and time, no dysarthria or aphasia, Fund of knowledge is appropriate.  Remote memory are intact.  Attention and concentration are normal.    Able to name objects and repeat phrases. MOCA score 25/30 (missed 5 points for delayed recall) Cranial nerves: CN I: not tested CN II: pupils equal, round and reactive to light, visual fields intact, fundi unremarkable. CN III, IV, VI:  full range of motion, no nystagmus, no ptosis CN V: facial sensation intact CN VII: upper and lower face symmetric CN VIII: hearing intact CN IX, X: gag intact, uvula midline CN XI: sternocleidomastoid and trapezius muscles intact CN XII: tongue midline Bulk &  Tone: normal, no fasciculations. Motor: 5/5 throughout with no pronator drift. Sensation: decreased cold on right calf, intact on right foot; decreased pin on right LE; decreased vibration in stocking distribution to bilateral ankles, intact joint position sense.  Intact to all modalities on both UE.  Romberg test slight sway Deep Tendon Reflexes: +2 on both UE, left patella. +1 right patella. Absent ankle jerks bilaterally.  No ankle clonus Plantar responses: downgoing bilaterally Cerebellar: no incoordination on finger to nose Gait: narrow-based and steady, difficulty with tandem walk but able Tremor: none  IMPRESSION: This is a pleasant 80 year old right-handed woman with a history of hypertension, presenting with memory loss over the past few months.  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  Her MOCA score is 25/30 (normal >26/30), she was unable to recall all 5 words.  Her neurological exam  shows decreased sensation on the right LE that she reports started after surgery, as well as in a stocking distribution suggestive of peripheral neuropathy. MRI brain in 2014 showed diffuse atrophy and mild chronic microvascular disease.  We discussed memory changes, causes of memory problems, including control of vascular risk factors, check TSH, B12, and pseudodementia due to depression and anxiety.  We discussed the importance of physical exercise and brain stimulation exercises for brain health.  I discussed the option for starting cholinesterase inhibitors such as Aricept, however she reported that she feels there is a big anxiety component due to her husband.  She became tearful and reported getting anxious about her husband falling frequently and refusing to see a doctor.  She opted to start citalopram 10mg  daily for anxiety and will discuss further uptitration with Dr. Tawanna Cooler. Side effects were discussed.  She will also discuss her husband's situation with him.  She will follow-up in 6 months and knows to call our office for any problems.  Thank you for allowing me to participate in the care of this patient. Please do not hesitate to call for any questions or concerns.   Patrcia Dolly, M.D.

## 2014-03-10 ENCOUNTER — Ambulatory Visit (INDEPENDENT_AMBULATORY_CARE_PROVIDER_SITE_OTHER): Payer: Medicare Other | Admitting: Family Medicine

## 2014-03-10 ENCOUNTER — Encounter: Payer: Self-pay | Admitting: Family Medicine

## 2014-03-10 VITALS — BP 170/100 | Temp 98.0°F | Wt 106.0 lb

## 2014-03-10 DIAGNOSIS — I10 Essential (primary) hypertension: Secondary | ICD-10-CM

## 2014-03-10 DIAGNOSIS — F411 Generalized anxiety disorder: Secondary | ICD-10-CM

## 2014-03-10 MED ORDER — CITALOPRAM HYDROBROMIDE 10 MG PO TABS: 10.0000 mg | ORAL_TABLET | Freq: Every day | ORAL | Status: DC

## 2014-03-10 NOTE — Progress Notes (Signed)
Pre visit review using our clinic review tool, if applicable. No additional management support is needed unless otherwise documented below in the visit note. 

## 2014-03-10 NOTE — Progress Notes (Signed)
   Subjective:    Patient ID: Alvy BealBarbara B Bothun, female    DOB: 1934-09-23, 80 y.o.   MRN: [id number]  HPI Britta MccreedyBarbara is a 80 year old married female smoker ,,,,,,, who again declines a smoking cessation program,,,, who comes in today for followup of hypertension  Her blood pressure in the past on Lopressor 25 mg twice a day was normal. Nap today is 170/100. When she was at the neurologist recently was also elevated. Upon further questioning she states she's she's not taken the evening dose  Neurology ordered a B12 and a thyroid level both of which are normal   Review of Systems Review of systems negative except she was given Celexa 10 mg to take and wonders when she's going to feel better    Objective:   Physical Exam Well-developed and nourished thin female no acute distress vital signs stable she's afebrile except for BP 170/100 pulse 70 and regular       Assessment & Plan:  Hypertension not at goal,,,,,,, take the Lopressor 50 mg daily in the morning  BP check daily followup in one month  History of anxiety continue Celexa 10 mg daily,

## 2014-03-10 NOTE — Patient Instructions (Signed)
Celexa 10 mg,,,,,,,,,,, 1 daily in the morning  Lopressor 25 mg.......... 2 daily in the morning  Check your blood pressure daily in the morning  Return in one month for followup........Marland Kitchen. bring a record of all your blood pressure readings and the device  Take an extra Lopressor tablet now when you get home

## 2014-03-11 ENCOUNTER — Telehealth: Payer: Self-pay | Admitting: Family Medicine

## 2014-03-11 NOTE — Telephone Encounter (Signed)
Relevant patient education mailed to patient.  

## 2014-03-21 LAB — HM DEXA SCAN

## 2014-03-23 ENCOUNTER — Other Ambulatory Visit: Payer: Self-pay | Admitting: Family Medicine

## 2014-04-17 ENCOUNTER — Encounter: Payer: Self-pay | Admitting: Family Medicine

## 2014-04-17 ENCOUNTER — Telehealth: Payer: Self-pay | Admitting: Family Medicine

## 2014-04-17 ENCOUNTER — Ambulatory Visit (INDEPENDENT_AMBULATORY_CARE_PROVIDER_SITE_OTHER): Payer: Medicare Other | Admitting: Family Medicine

## 2014-04-17 VITALS — BP 130/80 | Temp 97.6°F | Wt 103.0 lb

## 2014-04-17 DIAGNOSIS — I10 Essential (primary) hypertension: Secondary | ICD-10-CM

## 2014-04-17 MED ORDER — METOPROLOL TARTRATE 50 MG PO TABS: ORAL_TABLET | ORAL | Status: DC

## 2014-04-17 NOTE — Patient Instructions (Signed)
Lopressor 50 mg,,,,,,,,,, 1 a day at bedtime  Celexa 10 mg,,,,,,,,, 1 at bedtime as

## 2014-04-17 NOTE — Telephone Encounter (Signed)
Pt did not know when she should return for appt. Not on avs. pls advise

## 2014-04-17 NOTE — Progress Notes (Signed)
   Subjective:    Patient ID: Shirley [name], female    DOB: [birth year], 80 y.o.   MRN: [id number]  HPI Shirley MccreedyBarbara is a 80 year old female who comes in today for followup of hypertension  She currently takes 50 mg of Lopressor daily in the morning and her blood pressure is 130/80 no side effects to medication  She's not exercising on a regular basis because of hip pain. Recommended water aerobics    Review of Systems    negative Objective:   Physical Exam  Well-developed well-nourished female no acute distress vital signs stable she's afebrile BP 130/80 right arm pulse 72 and regular      Assessment & Plan:  Hypertension at goal continue current therapy

## 2014-04-17 NOTE — Telephone Encounter (Signed)
Patient to return for her yearly physical

## 2014-04-18 ENCOUNTER — Encounter: Payer: Self-pay | Admitting: Family Medicine

## 2014-04-18 ENCOUNTER — Telehealth: Payer: Self-pay | Admitting: Family Medicine

## 2014-04-18 NOTE — Telephone Encounter (Signed)
Relevant patient education mailed to patient.  

## 2014-06-20 ENCOUNTER — Encounter: Payer: Self-pay | Admitting: Family Medicine

## 2014-07-14 ENCOUNTER — Telehealth: Payer: Self-pay | Admitting: Family Medicine

## 2014-07-14 ENCOUNTER — Encounter: Payer: Self-pay | Admitting: Neurology

## 2014-07-14 ENCOUNTER — Ambulatory Visit (INDEPENDENT_AMBULATORY_CARE_PROVIDER_SITE_OTHER): Payer: Medicare Other | Admitting: Neurology

## 2014-07-14 VITALS — BP 122/84 | HR 74 | Resp 16 | Ht 63.0 in | Wt 107.0 lb

## 2014-07-14 DIAGNOSIS — F411 Generalized anxiety disorder: Secondary | ICD-10-CM

## 2014-07-14 DIAGNOSIS — R413 Other amnesia: Secondary | ICD-10-CM

## 2014-07-14 MED ORDER — DONEPEZIL HCL 10 MG PO TABS: ORAL_TABLET | ORAL | Status: DC

## 2014-07-14 MED ORDER — CITALOPRAM HYDROBROMIDE 10 MG PO TABS: 10.0000 mg | ORAL_TABLET | Freq: Every day | ORAL | Status: DC

## 2014-07-14 NOTE — Patient Instructions (Signed)
1. Start Aricept 10mg : Take 1/2 tablet daily for 1 week, then increase to 1 tablet daily 2. Continue Celexa 10mg  daily 3. Continue physical and brain stimulation exercises for brain health

## 2014-07-14 NOTE — Progress Notes (Signed)
NEUROLOGY FOLLOW UP OFFICE NOTE  Shirley [name] [id number]  HISTORY OF PRESENT ILLNESS: I had the pleasure of seeing Shirley [name] in follow-up in the neurology clinic on 07/14/2014.  The patient was last seen 4 months ago for memory loss.  She is accompanied by her son today who reports that they have noticed some worsening in short-term memory since her last visit.  She asks the same questions repeatedly, and acts like it is new information every time.  Her long-term memory is good.  She denies any missed bill payments or missing medications. She continues to drive and denies getting lost or disoriented.  On her initial visit, we discussed etiologies of memory loss, she became tearful and endorsed significant anxiety with her husband.  She opted to start Citalopram, and is taking 10mg /day with no side effects.  Her son is concerned about her feeling bad/tired when she wakes up/first 3 hours in the morning, feeling fuzzy like she had a drink. She denies drinking any more alcohol. She reports poor sleep because her "mind just keeps going." She feels tired all the time. She states her husband "does not believe I have back pain."    HPI:  This is a pleasant 80 yo RH woman who presented with memory changes that she started to notice after she fell in November 2014 and sustained a right femur fracture. She reports that she has "never been someone who can remember things," however over the past few months, she feels that "there are a lot of things I can't pull up." She has always been a note-taker, but reports that it takes her a longer time to go through things in her head now. She states that it is "not bad, just insignificant things," but has her worried. She would occasionally misplace things, but does not get turned around in her home. She lives with her husband and denies being told she repeats things excessively. She occasionally forgets details of conversations. She is in-charge of their financial  matters, and does not have any difficulties with these. She does not forget to take her medications. She has occasional word-finding difficulties, and notices that when she reads, she has to go back to the beginning of a sentence. She is slower to follow instructions. She continues to cook and has not left the stove on. She continues to drive and does not get lost or confused. She has no difficulties performing ADLs. There is no family history of dementia.   I personally reviewed MRI brain done 01/2013 which showed diffuse global volume loss with enlarged ventricles, scattered T2 FLAIR hyperintensities. No acute abnormalities seen.  Laboratory Data:  Component     Latest Ref Rng 03/06/2014  Vitamin B-12     211 - 911 pg/mL 442  TSH     0.350 - 4.500 uIU/mL 1.027    PAST MEDICAL HISTORY: Past Medical History  Diagnosis Date  . Hypertension   . Osteoporosis   . PMR (polymyalgia rheumatica)   . Tobacco abuse   . Glucose intolerance (impaired glucose tolerance)   . Osteoarthritis   . Arthritis     "right shoulder; that's why I had to have it replaced"    MEDICATIONS: Current Outpatient Prescriptions on File Prior to Visit  Medication Sig Dispense Refill  . folic acid (FOLVITE) 1 MG tablet Take 1 tablet (1 mg total) by mouth daily.      Marland Kitchen latanoprost (XALATAN) 0.005 % ophthalmic solution       .  metoprolol tartrate (LOPRESSOR) 25 MG tablet TAKE 1 TABLET (25 MG TOTAL) BY MOUTH 2 (TWO) TIMES DAILY.  180 tablet  3  . Multiple Vitamin (MULTIVITAMIN WITH MINERALS) TABS tablet Take 1 tablet by mouth daily.      . metoprolol (LOPRESSOR) 50 MG tablet 1 by mouth each bedtime  100 tablet  3  . thiamine 100 MG tablet Take 1 tablet (100 mg total) by mouth daily.       No current facility-administered medications on file prior to visit.    ALLERGIES: No Known Allergies  FAMILY HISTORY: Family History  Problem Relation Age of Onset  . ALS Father   . Hypertension Sister     SOCIAL  HISTORY: History   Social History  . Marital Status: Married    Spouse Name: N/A    Number of Children: 3  . Years of Education: N/A   Occupational History  . retired    Social History Main Topics  . Smoking status: Current Every Day Smoker -- 0.50 packs/day for 40 years    Types: Cigarettes    Last Attempt to Quit: 12/06/1991  . Smokeless tobacco: Never Used  . Alcohol Use: 8.4 oz/week    14 Glasses of wine per week  . Drug Use: No  . Sexual Activity: No   Other Topics Concern  . Not on file   Social History Narrative   Retired   Married   Alcohol use-no   Drug use-no   Regular Exercise-yes   Current Smoker          REVIEW OF SYSTEMS: Constitutional: No fevers, chills, or sweats, no generalized fatigue, change in appetite Eyes: No visual changes, double vision, eye pain Ear, nose and throat: No hearing loss, ear pain, nasal congestion, sore throat Cardiovascular: No chest pain, palpitations Respiratory:  No shortness of breath at rest or with exertion, wheezes GastrointestinaI: No nausea, vomiting, diarrhea, abdominal pain, fecal incontinence Genitourinary:  No dysuria, urinary retention or frequency Musculoskeletal:  No neck pain, +back pain Integumentary: No rash, pruritus, skin lesions Neurological: as above Psychiatric: No depression, insomnia, anxiety Endocrine: No palpitations, fatigue, diaphoresis, mood swings, change in appetite, change in weight, increased thirst Hematologic/Lymphatic:  No anemia, purpura, petechiae. Allergic/Immunologic: no itchy/runny eyes, nasal congestion, recent allergic reactions, rashes  PHYSICAL EXAM: Filed Vitals:   07/14/14 1313  BP: 122/84  Pulse: 74  Resp: 16   General: No acute distress Head:  Normocephalic/atraumatic Neck: supple, no paraspinal tenderness, full range of motion Heart:  Regular rate and rhythm Lungs:  Clear to auscultation bilaterally Back: No paraspinal tenderness Skin/Extremities: No rash, no  edema Neurological Exam: Mental status: alert and oriented to person, place, and time, no dysarthria or aphasia, Fund of knowledge is appropriate. Remote memory intact. Attention and concentration are normal. Able to name objects and repeat phrases.  Montreal Cognitive Assessment  07/15/2014  Visuospatial/ Executive (0/5) 5  Naming (0/3) 3  Attention: Read list of digits (0/2) 1  Attention: Read list of letters (0/1) 1  Attention: Serial 7 subtraction starting at 100 (0/3) 3  Language: Repeat phrase (0/2) 2  Language : Fluency (0/1) 1  Abstraction (0/2) 2  Delayed Recall (0/5) 0  Orientation (0/6) 6  Total 24   Cranial nerves:  CN I: not tested  CN II: pupils equal, round and reactive to light, visual fields intact, fundi unremarkable.  CN III, IV, VI: full range of motion, no nystagmus, no ptosis  CN V: facial sensation intact  CN  VII: upper and lower face symmetric  CN VIII: hearing intact  CN IX, X: gag intact, uvula midline  CN XI: sternocleidomastoid and trapezius muscles intact  CN XII: tongue midline  Bulk & Tone: normal, no cogwheeling, no fasciculations.  Motor: 5/5 throughout with no pronator drift.  Sensation: decreased vibration in stocking distribution to bilateral ankles. Intact to light touch on both UE. Romberg test slight sway  Deep Tendon Reflexes: +2 on both UE, left patella. +1 right patella. Absent ankle jerks bilaterally. No ankle clonus  Plantar responses: downgoing bilaterally  Cerebellar: no incoordination on finger to nose  Gait: narrow-based and steady, difficulty with tandem walk but able (similar to prior) Tremor: none   IMPRESSION:  This is a pleasant 80 yo RH woman with a history of hypertension who presented with memory loss since early 2015.  Her son feels short-term memory has worsened slightly since her last visit.  Her MOCA today is 24/30 (25/30 on previous visit 4 months ago), again with difficulty with delayed recall.  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  She continues to endorse anxiety and became tearful today. Continue Citalopram. She is interested in starting a cholinesterase inhibitor and will start low dose Aricept with uptitration schedule. Side effects were discussed.  We again discussed the importance of physical exercise and brain stimulation exercises for brain health. She will follow-up in 3 months and knows to call our office for any problems.  Thank you for allowing me to participate in her care.  Please do not hesitate to call for any questions or concerns.  The duration of this appointment visit was 25 minutes of face-to-face time with the patient.  Greater than 50% of this time was spent in counseling, explanation of diagnosis, planning of further management, and coordination of care.   Patrcia Dolly, M.D.   CC: Dr. Tawanna Cooler

## 2014-07-14 NOTE — Telephone Encounter (Signed)
[id number] - [place],  - [address] is requesting 90 day re-fill on citalopram (CELEXA) 10 MG tablet

## 2014-07-15 ENCOUNTER — Encounter: Payer: Self-pay | Admitting: Neurology

## 2014-08-05 ENCOUNTER — Ambulatory Visit (INDEPENDENT_AMBULATORY_CARE_PROVIDER_SITE_OTHER): Payer: Medicare Other | Admitting: *Deleted

## 2014-08-05 DIAGNOSIS — Z23 Encounter for immunization: Secondary | ICD-10-CM

## 2014-09-11 ENCOUNTER — Other Ambulatory Visit (INDEPENDENT_AMBULATORY_CARE_PROVIDER_SITE_OTHER): Payer: Medicare Other

## 2014-09-11 DIAGNOSIS — Z0000 Encounter for general adult medical examination without abnormal findings: Secondary | ICD-10-CM

## 2014-09-11 DIAGNOSIS — I10 Essential (primary) hypertension: Secondary | ICD-10-CM

## 2014-09-11 LAB — HEPATIC FUNCTION PANEL
ALT: 16 U/L (ref 0–35)
AST: 24 U/L (ref 0–37)
Albumin: 3.9 g/dL (ref 3.5–5.2)
Alkaline Phosphatase: 63 U/L (ref 39–117)
BILIRUBIN DIRECT: 0.1 mg/dL (ref 0.0–0.3)
TOTAL PROTEIN: 7.5 g/dL (ref 6.0–8.3)
Total Bilirubin: 0.7 mg/dL (ref 0.2–1.2)

## 2014-09-11 LAB — BASIC METABOLIC PANEL
BUN: 20 mg/dL (ref 6–23)
CO2: 30 mEq/L (ref 19–32)
CREATININE: 0.6 mg/dL (ref 0.4–1.2)
Calcium: 9.2 mg/dL (ref 8.4–10.5)
Chloride: 100 mEq/L (ref 96–112)
GFR: 104.63 mL/min (ref >60.00)
Glucose, Bld: 91 mg/dL (ref 70–99)
Potassium: 3.7 mEq/L (ref 3.5–5.1)
Sodium: 138 mEq/L (ref 135–145)

## 2014-09-11 LAB — POCT URINALYSIS DIPSTICK
Bilirubin, UA: NEGATIVE
Glucose, UA: NEGATIVE
Ketones, UA: NEGATIVE
Leukocytes, UA: NEGATIVE
Nitrite, UA: NEGATIVE
Spec Grav, UA: 1.02
Urobilinogen, UA: 1
pH, UA: 6.5

## 2014-09-11 LAB — LIPID PANEL
CHOLESTEROL: 171 mg/dL (ref 0–200)
HDL: 47 mg/dL (ref >39.00)
LDL CALC: 98 mg/dL (ref 0–99)
NonHDL: 124
Total CHOL/HDL Ratio: 4
Triglycerides: 128 mg/dL (ref 0.0–149.0)
VLDL: 25.6 mg/dL (ref 0.0–40.0)

## 2014-09-11 LAB — CBC WITH DIFFERENTIAL/PLATELET
BASOS PCT: 0.7 % (ref 0.0–3.0)
Basophils Absolute: 0.1 10*3/uL (ref 0.0–0.1)
EOS PCT: 4.7 % (ref 0.0–5.0)
Eosinophils Absolute: 0.4 10*3/uL (ref 0.0–0.7)
HCT: 47.4 % — ABNORMAL HIGH (ref 36.0–46.0)
Hemoglobin: 15.6 g/dL — ABNORMAL HIGH (ref 12.0–15.0)
LYMPHS PCT: 21.4 % (ref 12.0–46.0)
Lymphs Abs: 1.6 10*3/uL (ref 0.7–4.0)
MCHC: 33 g/dL (ref 30.0–36.0)
MCV: 98.4 fl (ref 78.0–100.0)
MONOS PCT: 10.1 % (ref 3.0–12.0)
Monocytes Absolute: 0.8 10*3/uL (ref 0.1–1.0)
NEUTROS PCT: 63.1 % (ref 43.0–77.0)
Neutro Abs: 4.8 10*3/uL (ref 1.4–7.7)
Platelets: 180 10*3/uL (ref 150.0–400.0)
RBC: 4.81 Mil/uL (ref 3.87–5.11)
RDW: 13.6 % (ref 11.5–15.5)
WBC: 7.5 10*3/uL (ref 4.0–10.5)

## 2014-09-11 LAB — TSH: TSH: 1.11 u[IU]/mL (ref 0.35–4.50)

## 2014-09-18 ENCOUNTER — Encounter: Payer: Medicare Other | Admitting: Family Medicine

## 2014-09-25 ENCOUNTER — Encounter (HOSPITAL_COMMUNITY): Payer: Self-pay | Admitting: Emergency Medicine

## 2014-09-25 ENCOUNTER — Emergency Department (HOSPITAL_COMMUNITY)
Admission: EM | Admit: 2014-09-25 | Discharge: 2014-09-25 | Disposition: A | Discharge status: Home / Self Care | Payer: Medicare Other | Attending: Emergency Medicine | Admitting: Emergency Medicine

## 2014-09-25 ENCOUNTER — Emergency Department (HOSPITAL_COMMUNITY): Payer: Medicare Other

## 2014-09-25 DIAGNOSIS — Z8739 Personal history of other diseases of the musculoskeletal system and connective tissue: Secondary | ICD-10-CM | POA: Diagnosis not present

## 2014-09-25 DIAGNOSIS — Z79899 Other long term (current) drug therapy: Secondary | ICD-10-CM | POA: Insufficient documentation

## 2014-09-25 DIAGNOSIS — I10 Essential (primary) hypertension: Secondary | ICD-10-CM | POA: Insufficient documentation

## 2014-09-25 DIAGNOSIS — S0101XA Laceration without foreign body of scalp, initial encounter: Secondary | ICD-10-CM | POA: Diagnosis not present

## 2014-09-25 DIAGNOSIS — W1830XA Fall on same level, unspecified, initial encounter: Secondary | ICD-10-CM | POA: Diagnosis not present

## 2014-09-25 DIAGNOSIS — R55 Syncope and collapse: Secondary | ICD-10-CM | POA: Diagnosis present

## 2014-09-25 DIAGNOSIS — Y9389 Activity, other specified: Secondary | ICD-10-CM | POA: Diagnosis not present

## 2014-09-25 DIAGNOSIS — Z23 Encounter for immunization: Secondary | ICD-10-CM | POA: Insufficient documentation

## 2014-09-25 DIAGNOSIS — Z720 Tobacco use: Secondary | ICD-10-CM | POA: Insufficient documentation

## 2014-09-25 DIAGNOSIS — Y92009 Unspecified place in unspecified non-institutional (private) residence as the place of occurrence of the external cause: Secondary | ICD-10-CM | POA: Diagnosis not present

## 2014-09-25 DIAGNOSIS — W19XXXA Unspecified fall, initial encounter: Secondary | ICD-10-CM

## 2014-09-25 MED ORDER — TETANUS-DIPHTH-ACELL PERTUSSIS 5-2.5-18.5 LF-MCG/0.5 IM SUSP
0.5000 mL | Freq: Once | INTRAMUSCULAR | Status: AC
  Administered 2014-09-25: 0.5 mL via INTRAMUSCULAR
  Filled 2014-09-25: qty 0.5

## 2014-09-25 MED ORDER — LIDOCAINE HCL 1 % IJ SOLN
INTRAMUSCULAR | Status: AC
  Filled 2014-09-25: qty 20

## 2014-09-25 NOTE — ED Provider Notes (Signed)
CSN: [id number]     Arrival date & time 09/25/14  0036 History   First MD Initiated Contact with Patient 09/25/14 0051     Chief Complaint  Patient presents with  . Loss of Consciousness     (Consider location/radiation/quality/duration/timing/severity/associated sxs/prior Treatment) Patient is a 80 y.o. female presenting with syncope. The history is provided by the patient and a relative.  Loss of Consciousness She was found on the floor by her daughters. She was in bed, but blood was on the floor. Patient has no memory of what happened. She apparently had complained of some weakness in the right side of her body in her right leg feeling numb but is not complaining of anything currently. She does not known last tetanus immunization was. She denies any neck pain, back pain, extremity pain. There is no nausea or vomiting. She denies any chest pain. She did suffer laceration to the left side of her scalp.  Past Medical History  Diagnosis Date  . Hypertension   . Osteoporosis   . PMR (polymyalgia rheumatica)   . Tobacco abuse   . Glucose intolerance (impaired glucose tolerance)   . Osteoarthritis   . Arthritis     "right shoulder; that's why I had to have it replaced"   Past Surgical History  Procedure Laterality Date  . Cataract extraction w/ intraocular lens  implant, bilateral      2008   . Posterior fusion lumbar spine  03/28/12  . Total shoulder replacement  06/2011    right  . Shoulder arthroscopy w/ rotator cuff repair  ~ 01/2011    right  . Vaginal hysterectomy  1970's  . Kyphoplasty N/A 02/28/2013    Procedure: KYPHOPLASTY;  Surgeon: Cristi LoronJeffrey D Jenkins, MD;  Location: [facility];  Service: Neurosurgery;  Laterality: N/A;  Lumbar two Kyphoplasty  . Hip pinning,cannulated Right 10/20/2013    Procedure: CANNULATED HIP PINNING;  Surgeon: Eldred MangesMark C Yates, MD;  Location: [facility];  Service: Orthopedics;  Laterality: Right;   Family History  Problem Relation Age of Onset  . ALS  Father   . Hypertension Sister    History  Substance Use Topics  . Smoking status: Current Every Day Smoker -- 0.50 packs/day for 40 years    Types: Cigarettes    Last Attempt to Quit: 12/06/1991  . Smokeless tobacco: Never Used  . Alcohol Use: Yes     Comment: occ   OB History   Grav Para Term Preterm Abortions TAB SAB Ect Mult Living   3              Review of Systems  Cardiovascular: Positive for syncope.  All other systems reviewed and are negative.     Allergies  Review of patient's allergies indicates no known allergies.  Home Medications   Prior to Admission medications   Medication Sig Start Date End Date Taking? Authorizing Provider  citalopram (CELEXA) 10 MG tablet Take 1 tablet (10 mg total) by mouth daily. 07/14/14   Roderick PeeJeffrey A Todd, MD  donepezil (ARICEPT) 10 MG tablet Take 1/2 tablet daily for 1 week, then increase to 1 tablet daily 07/14/14   Van ClinesKaren M Aquino, MD  folic acid (FOLVITE) 1 MG tablet Take 1 tablet (1 mg total) by mouth daily. 10/22/13   Clanford Cyndie MullL Johnson, MD  latanoprost (XALATAN) 0.005 % ophthalmic solution  02/26/14   Historical Provider, MD  metoprolol (LOPRESSOR) 50 MG tablet 1 by mouth each bedtime 04/17/14   Roderick PeeJeffrey A Todd, MD  metoprolol tartrate (LOPRESSOR) 25 MG tablet TAKE 1 TABLET (25 MG TOTAL) BY MOUTH 2 (TWO) TIMES DAILY.    Roderick Pee, MD  Multiple Vitamin (MULTIVITAMIN WITH MINERALS) TABS tablet Take 1 tablet by mouth daily. 10/22/13   Clanford Cyndie Mull, MD  thiamine 100 MG tablet Take 1 tablet (100 mg total) by mouth daily. 10/22/13   Clanford L Johnson, MD   BP 143/86  Pulse 70  Temp(Src) 97.5 F (36.4 C) (Oral)  Resp 18  SpO2 100% Physical Exam  Nursing note and vitals reviewed.  80 year old female, resting comfortably and in no acute distress. Vital signs are significant for borderline hypertension. Oxygen saturation is 100%, which is normal. Head is normocephalic. Laceration and hematoma are present in the left parietal  area. PERRLA, EOMI. Oropharynx is clear. Neck is nontender without adenopathy or JVD. Back is nontender and there is no CVA tenderness. Lungs are clear without rales, wheezes, or rhonchi. Chest is nontender. Heart has regular rate and rhythm without murmur. Abdomen is soft, flat, nontender without masses or hepatosplenomegaly and peristalsis is normoactive. Extremities have no cyanosis or edema, full range of motion is present. Skin is warm and dry without rash. Neurologic: Mental status is normal, cranial nerves are intact, there are no motor or sensory deficits. There is no pronator drift. Strength is 5/54 elbow flexion, elbow extension, or grip strength, hip flexion. There is no pronator drift.  ED Course  Procedures (including critical care time) LACERATION REPAIR Performed by: BJYNW,GNFAO Authorized by: ZHYQM,VHQIO Consent: Verbal consent obtained. Risks and benefits: risks, benefits and alternatives were discussed Consent given by: patient Patient identity confirmed: provided demographic data Prepped and Draped in normal sterile fashion Wound explored  Laceration Location: scalp  Laceration Length: 5.0 cm  No Foreign Bodies seen or palpated  Anesthesia:none  Amount of cleaning: standard  Skin closure: close  Number of staples: 5  Technique: stapling  Patient tolerance: Patient tolerated the procedure well with no immediate complications.  Imaging Review Ct Head Wo Contrast  09/25/2014   CLINICAL DATA:  Loss of consciousness. Presumed Fall. Patient was in bed with laceration to the left side of there head and blood was found on the bathroom.  EXAM: CT HEAD WITHOUT CONTRAST  CT CERVICAL SPINE WITHOUT CONTRAST  TECHNIQUE: Multidetector CT imaging of the head and cervical spine was performed following the standard protocol without intravenous contrast. Multiplanar CT image reconstructions of the cervical spine were also generated.  COMPARISON:  CT head 02/04/2013.  MRI  brain 01/30/2013.  FINDINGS: CT HEAD FINDINGS  Subcutaneous scalp hematoma over the left posterior parietal region. Diffuse cerebral atrophy. Mild ventricular dilatation consistent with central atrophy. Low-attenuation changes in the deep white matter consistent small vessel ischemic change. No mass effect or midline shift. No abnormal extra-axial fluid collections. Gray-white matter junctions are distinct. Basal cisterns are not effaced. No evidence of acute intracranial hemorrhage. No depressed skull fractures. Mucosal thickening in the paranasal sinuses. Mastoid air cells are not opacified.  CT CERVICAL SPINE FINDINGS  Normal alignment of the cervical spine and facet joints. C1-2 articulation appears intact. Diffuse degenerative changes throughout the cervical spine with narrowed cervical interspaces and associated endplate hypertrophic changes. Limbus vertebrae at C5 and C6. Diffuse degenerative change throughout the cervical facet joints. No vertebral compression deformities. No prevertebral soft tissue swelling. No focal bone lesion or bone destruction. Soft tissues are unremarkable. Vascular calcifications in the cervical carotid arteries.  IMPRESSION: No acute intracranial abnormalities. Diffuse degenerative change throughout  the cervical spine. Normal alignment. No displaced fractures identified.   Electronically Signed   By: Burman NievesWilliam  Stevens M.D.   On: 09/25/2014 01:40   Ct Cervical Spine Wo Contrast  09/25/2014   CLINICAL DATA:  Loss of consciousness. Presumed Fall. Patient was in bed with laceration to the left side of there head and blood was found on the bathroom.  EXAM: CT HEAD WITHOUT CONTRAST  CT CERVICAL SPINE WITHOUT CONTRAST  TECHNIQUE: Multidetector CT imaging of the head and cervical spine was performed following the standard protocol without intravenous contrast. Multiplanar CT image reconstructions of the cervical spine were also generated.  COMPARISON:  CT head 02/04/2013.  MRI brain  01/30/2013.  FINDINGS: CT HEAD FINDINGS  Subcutaneous scalp hematoma over the left posterior parietal region. Diffuse cerebral atrophy. Mild ventricular dilatation consistent with central atrophy. Low-attenuation changes in the deep white matter consistent small vessel ischemic change. No mass effect or midline shift. No abnormal extra-axial fluid collections. Gray-white matter junctions are distinct. Basal cisterns are not effaced. No evidence of acute intracranial hemorrhage. No depressed skull fractures. Mucosal thickening in the paranasal sinuses. Mastoid air cells are not opacified.  CT CERVICAL SPINE FINDINGS  Normal alignment of the cervical spine and facet joints. C1-2 articulation appears intact. Diffuse degenerative changes throughout the cervical spine with narrowed cervical interspaces and associated endplate hypertrophic changes. Limbus vertebrae at C5 and C6. Diffuse degenerative change throughout the cervical facet joints. No vertebral compression deformities. No prevertebral soft tissue swelling. No focal bone lesion or bone destruction. Soft tissues are unremarkable. Vascular calcifications in the cervical carotid arteries.  IMPRESSION: No acute intracranial abnormalities. Diffuse degenerative change throughout the cervical spine. Normal alignment. No displaced fractures identified.   Electronically Signed   By: Burman NievesWilliam  Stevens M.D.   On: 09/25/2014 01:40     EKG Interpretation   Date/Time:  Thursday September 25 2014 00:49:51 EDT Ventricular Rate:  80 PR Interval:  143 QRS Duration: 80 QT Interval:  393 QTC Calculation: 453 R Axis:   37 Text Interpretation:  Sinus rhythm Borderline T abnormalities, inferior  leads Baseline wander in lead(s) II When compared with ECG of 10/20/2013,  No significant change was found Confirmed by [facility]  MD, Lorianne Malbrough ([id number]) on  09/25/2014 1:01:56 AM      MDM   Final diagnoses:  Fall at home, initial encounter  Laceration of scalp, initial encounter     Fall with head injury and amnesia. Doubt primary syncope. Old records are reviewed and last tetanus immunization was 10 years ago says she is given a TDaP booster. She is sent for CT of head and cervical spine. No evidence of stroke on my exam.  CT is unremarkable. Wound is closed with surgical staples and she is discharged with instructions of the staples removed in 7-10 days.  Dione Boozeavid Ziyah Cordoba, MD 09/25/14 [phone]

## 2014-09-25 NOTE — ED Notes (Signed)
Pt's family came home and saw blood on the floor and found pt in bed  Pt states she has no idea what happened  Pt states she has weakness in the right side of her body and her right leg feels numb  Pt has a laceration noted to the left side of her head  Pt states she has no recollection of what happened

## 2014-09-25 NOTE — Discharge Instructions (Signed)
Staples need to be removed in 7-10 days.  Laceration Care, Adult A laceration is a cut or lesion that goes through all layers of the skin and into the tissue just beneath the skin. TREATMENT  Some lacerations may not require closure. Some lacerations may not be able to be closed due to an increased risk of infection. It is important to see your caregiver as soon as possible after an injury to minimize the risk of infection and maximize the opportunity for successful closure. If closure is appropriate, pain medicines may be given, if needed. The wound will be cleaned to help prevent infection. Your caregiver will use stitches (sutures), staples, wound glue (adhesive), or skin adhesive strips to repair the laceration. These tools bring the skin edges together to allow for faster healing and a better cosmetic outcome. However, all wounds will heal with a scar. Once the wound has healed, scarring can be minimized by covering the wound with sunscreen during the day for 1 full year. HOME CARE INSTRUCTIONS  For sutures or staples:  Keep the wound clean and dry.  If you were given a bandage (dressing), you should change it at least once a day. Also, change the dressing if it becomes wet or dirty, or as directed by your caregiver.  Wash the wound with soap and water 2 times a day. Rinse the wound off with water to remove all soap. Pat the wound dry with a clean towel.  After cleaning, apply a thin layer of the antibiotic ointment as recommended by your caregiver. This will help prevent infection and keep the dressing from sticking.  You may shower as usual after the first 24 hours. Do not soak the wound in water until the sutures are removed.  Only take over-the-counter or prescription medicines for pain, discomfort, or fever as directed by your caregiver.  Get your sutures or staples removed as directed by your caregiver. For skin adhesive strips:  Keep the wound clean and dry.  Do not get the  skin adhesive strips wet. You may bathe carefully, using caution to keep the wound dry.  If the wound gets wet, pat it dry with a clean towel.  Skin adhesive strips will fall off on their own. You may trim the strips as the wound heals. Do not remove skin adhesive strips that are still stuck to the wound. They will fall off in time. For wound adhesive:  You may briefly wet your wound in the shower or bath. Do not soak or scrub the wound. Do not swim. Avoid periods of heavy perspiration until the skin adhesive has fallen off on its own. After showering or bathing, gently pat the wound dry with a clean towel.  Do not apply liquid medicine, cream medicine, or ointment medicine to your wound while the skin adhesive is in place. This may loosen the film before your wound is healed.  If a dressing is placed over the wound, be careful not to apply tape directly over the skin adhesive. This may cause the adhesive to be pulled off before the wound is healed.  Avoid prolonged exposure to sunlight or tanning lamps while the skin adhesive is in place. Exposure to ultraviolet light in the first year will darken the scar.  The skin adhesive will usually remain in place for 5 to 10 days, then naturally fall off the skin. Do not pick at the adhesive film. You may need a tetanus shot if:  You cannot remember when you had your last  tetanus shot.  You have never had a tetanus shot. If you get a tetanus shot, your arm may swell, get red, and feel warm to the touch. This is common and not a problem. If you need a tetanus shot and you choose not to have one, there is a rare chance of getting tetanus. Sickness from tetanus can be serious. SEEK MEDICAL CARE IF:   You have redness, swelling, or increasing pain in the wound.  You see a red line that goes away from the wound.  You have yellowish-white fluid (pus) coming from the wound.  You have a fever.  You notice a bad smell coming from the wound or  dressing.  Your wound breaks open before or after sutures have been removed.  You notice something coming out of the wound such as wood or glass.  Your wound is on your hand or foot and you cannot move a finger or toe. SEEK IMMEDIATE MEDICAL CARE IF:   Your pain is not controlled with prescribed medicine.  You have severe swelling around the wound causing pain and numbness or a change in color in your arm, hand, leg, or foot.  Your wound splits open and starts bleeding.  You have worsening numbness, weakness, or loss of function of any joint around or beyond the wound.  You develop painful lumps near the wound or on the skin anywhere on your body. MAKE SURE YOU:   Understand these instructions.  Will watch your condition.  Will get help right away if you are not doing well or get worse. Document Released: 11/21/2005 Document Revised: 02/13/2012 Document Reviewed: 05/17/2011 [facility] Patient Information 2015 [employer], [place]. This information is not intended to replace advice given to you by your health care provider. Make sure you discuss any questions you have with your health care provider.  Td Vaccine (Tetanus and Diphtheria): What You Need to Know 1. Why get vaccinated? Tetanus  and diphtheria are very serious diseases. They are rare in the [place] today, but people who do become infected often have severe complications. Td vaccine is used to protect adolescents and adults from both of these diseases. Both tetanus and diphtheria are infections caused by bacteria. Diphtheria spreads from person to person through coughing or sneezing. Tetanus-causing bacteria enter the body through cuts, scratches, or wounds. TETANUS (Lockjaw) causes painful muscle tightening and stiffness, usually all over the body.  It can lead to tightening of muscles in the head and neck so you can't open your mouth, swallow, or sometimes even breathe. Tetanus kills about 1 out of every 5 people who are  infected. DIPHTHERIA can cause a thick coating to form in the back of the throat.  It can lead to breathing problems, paralysis, heart failure, and death. Before vaccines, the [place] saw as many as 200,000 cases a year of diphtheria and hundreds of cases of tetanus. Since vaccination began, cases of both diseases have dropped by about 99%. 2. Td vaccine Td vaccine can protect adolescents and adults from tetanus and diphtheria. Td is usually given as a booster dose every 10 years but it can also be given earlier after a severe and dirty wound or burn. Your doctor can give you more information. Td may safely be given at the same time as other vaccines. 3. Some people should not get this vaccine  If you ever had a life-threatening allergic reaction after a dose of any tetanus or diphtheria containing vaccine, OR if you have a severe allergy  to any part of this vaccine, you should not get Td. Tell your doctor if you have any severe allergies.  Talk to your doctor if you:  have epilepsy or another nervous system problem,  had severe pain or swelling after any vaccine containing diphtheria or tetanus,  ever had Guillain Barr Syndrome (GBS),  aren't feeling well on the day the shot is scheduled. 4. Risks of a vaccine reaction With a vaccine, like any medicine, there is a chance of side effects. These are usually mild and go away on their own. Serious side effects are also possible, but are very rare. Most people who get Td vaccine do not have any problems with it. Mild Problems  following Td (Did not interfere with activities)  Pain where the shot was given (about 8 people in 10)  Redness or swelling where the shot was given (about 1 person in 3)  Mild fever (about 1 person in 15)  Headache or Tiredness (uncommon) Moderate Problems following Td (Interfered with activities, but did not require medical attention)  Fever over 102F (rare) Severe Problems  following Td (Unable  to perform usual activities; required medical attention)  Swelling, severe pain, bleeding and/or redness in the arm where the shot was given (rare). Problems that could happen after any vaccine:  Brief fainting spells can happen after any medical procedure, including vaccination. Sitting or lying down for about 15 minutes can help prevent fainting, and injuries caused by a fall. Tell your doctor if you feel dizzy, or have vision changes or ringing in the ears.  Severe shoulder pain and reduced range of motion in the arm where a shot was given can happen, very rarely, after a vaccination.  Severe allergic reactions from a vaccine are very rare, estimated at less than 1 in a million doses. If one were to occur, it would usually be within a few minutes to a few hours after the vaccination. 5. What if there is a serious reaction? What should I look for?  Look for anything that concerns you, such as signs of a severe allergic reaction, very high fever, or behavior changes. Signs of a severe allergic reaction can include hives, swelling of the face and throat, difficulty breathing, a fast heartbeat, dizziness, and weakness. These would usually start a few minutes to a few hours after the vaccination. What should I do?  If you think it is a severe allergic reaction or other emergency that can't wait, call 9-1-1 or get the person to the nearest hospital. Otherwise, call your doctor.  Afterward, the reaction should be reported to the Vaccine Adverse Event Reporting System (VAERS). Your doctor might file this report, or you can do it yourself through the VAERS web site at www.vaers.LAgents.no, or by calling 1-513-088-9211. VAERS is only for reporting reactions. They do not give medical advice. 6. The National Vaccine Injury Compensation Program The [employer] Vaccine Injury Compensation Program (VICP) is a federal program that was created to compensate people who may have been injured by certain  vaccines. Persons who believe they may have been injured by a vaccine can learn about the program and about filing a claim by calling 1-832-072-4914 or visiting the VICP website at SpiritualWord.at. 7. How can I learn more?  Ask your doctor.  Contact your local or state health department.  Contact the Centers for Disease Control and Prevention (CDC):  Call (863) 290-3764 (1-800-CDC-INFO)  Visit CDC's website at PicCapture.uy CDC Td Vaccine Interim VIS (01/08/13) Document Released: 09/18/2006 Document Revised: 04/07/2014  Document Reviewed: 03/05/2014 [facility] Patient Information 2015 [place], [place]. This information is not intended to replace advice given to you by your health care provider. Make sure you discuss any questions you have with your health care provider.

## 2014-10-01 ENCOUNTER — Encounter: Payer: Self-pay | Admitting: Family Medicine

## 2014-10-01 ENCOUNTER — Ambulatory Visit (INDEPENDENT_AMBULATORY_CARE_PROVIDER_SITE_OTHER): Payer: Medicare Other | Admitting: Family Medicine

## 2014-10-01 VITALS — BP 128/84 | Temp 97.5°F | Ht 63.0 in | Wt 108.9 lb

## 2014-10-01 DIAGNOSIS — F17200 Nicotine dependence, unspecified, uncomplicated: Secondary | ICD-10-CM

## 2014-10-01 DIAGNOSIS — R413 Other amnesia: Secondary | ICD-10-CM

## 2014-10-01 DIAGNOSIS — F411 Generalized anxiety disorder: Secondary | ICD-10-CM

## 2014-10-01 DIAGNOSIS — M5136 Other intervertebral disc degeneration, lumbar region: Secondary | ICD-10-CM

## 2014-10-01 DIAGNOSIS — Z720 Tobacco use: Secondary | ICD-10-CM

## 2014-10-01 DIAGNOSIS — I10 Essential (primary) hypertension: Secondary | ICD-10-CM

## 2014-10-01 MED ORDER — CITALOPRAM HYDROBROMIDE 10 MG PO TABS: 10.0000 mg | ORAL_TABLET | Freq: Every day | ORAL | Status: DC

## 2014-10-01 MED ORDER — METOPROLOL TARTRATE 50 MG PO TABS: ORAL_TABLET | ORAL | Status: DC

## 2014-10-01 MED ORDER — DONEPEZIL HCL 10 MG PO TABS: ORAL_TABLET | ORAL | Status: DC

## 2014-10-01 NOTE — Progress Notes (Signed)
   Subjective:    Patient ID: Shirley BealBarbara B Brownfield, female    DOB: 1934/03/31, 80 y.o.   MRN: [id number]  HPI Shirley MccreedyBarbara is a 80 year old married female smoker,,,,,,,,,, states she is only smoking 2 cigarettes a day now advised to quit completely again....... who comes in today for evaluation of hypertension mild depression and dementia  She takes Metaprel Paul 50 mg daily BP 120/84  She takes Aricept 10 mg daily for history of mild dementia and Celexa 10 mg at bedtime for mild depression  She gets routine eye care, dental care, BSE sporadically, hasn't had a mammogram in many years and at 978 and probably not have mammograms any further. She had a colonoscopy 10 years ago was normal. Vaccinations updated by [place]  Cognitive function fairly normal ,,,,,, she does not exercise on a regular basis, home health safety reviewed no issues identified, no guns in the house, she does have a healthcare power of attorney and living well.   Review of Systems  Constitutional: Negative.   HENT: Negative.   Eyes: Negative.   Respiratory: Negative.   Cardiovascular: Negative.   Gastrointestinal: Negative.   Endocrine: Negative.   Genitourinary: Negative.   Musculoskeletal: Negative.   Skin: Negative.   Allergic/Immunologic: Negative.   Neurological: Negative.   Hematological: Negative.   Psychiatric/Behavioral: Negative.        Objective:   Physical Exam  Nursing note and vitals reviewed. Constitutional: She appears well-developed and well-nourished.  HENT:  Head: Normocephalic and atraumatic.  Right Ear: External ear normal.  Left Ear: External ear normal.  Nose: Nose normal.  Mouth/Throat: Oropharynx is clear and moist.  Eyes: EOM are normal. Pupils are equal, round, and reactive to light.  Neck: Normal range of motion. Neck supple. No JVD present. No tracheal deviation present. No thyromegaly present.  Cardiovascular: Normal rate, regular rhythm, normal heart sounds and intact distal pulses.   Exam reveals no gallop and no friction rub.   No murmur heard. No carotid or aortic bruits peripheral pulses 1+ and symmetrical  Pulmonary/Chest: Effort normal and breath sounds normal. No stridor. No respiratory distress. She has no wheezes. She has no rales. She exhibits no tenderness.  Abdominal: Soft. Bowel sounds are normal. She exhibits no distension and no mass. There is no tenderness. There is no rebound and no guarding.  Genitourinary:  Bilateral breast exam normal  Musculoskeletal: Normal range of motion.  Lymphadenopathy:    She has no cervical adenopathy.  Neurological: She is alert. She has normal reflexes. No cranial nerve deficit. She exhibits normal muscle tone. Coordination normal.  Skin: Skin is warm and dry. No rash noted. No erythema. No pallor.  Extremely thin skin secondary to chronic tobacco abuse  Psychiatric: She has a normal mood and affect. Her behavior is normal. Judgment and thought content normal.          Assessment & Plan:  Hypertension ago continue Lopressor 50 mg daily  Dementia continue Aricept 10 mg daily  Mild depression continue Celexa 10 mg daily.  Tobacco abuse..........Marland Kitchen. Again,,,,,,,, I have talked her for 35 years about this,,,,,,, DOS smoking completely

## 2014-10-01 NOTE — Progress Notes (Signed)
Pre visit review using our clinic review tool, if applicable. No additional management support is needed unless otherwise documented below in the visit note. 

## 2014-10-01 NOTE — Patient Instructions (Signed)
Continue current medications  Return in one year for general physical examination sooner if any problems  I will be here until March............ after that you will need to pick one of the new physicians

## 2014-10-06 ENCOUNTER — Ambulatory Visit (INDEPENDENT_AMBULATORY_CARE_PROVIDER_SITE_OTHER): Payer: Medicare Other | Admitting: Family Medicine

## 2014-10-06 ENCOUNTER — Encounter: Payer: Self-pay | Admitting: Family Medicine

## 2014-10-06 VITALS — BP 140/90

## 2014-10-06 DIAGNOSIS — Z4802 Encounter for removal of sutures: Secondary | ICD-10-CM

## 2014-10-06 NOTE — Progress Notes (Signed)
Pre visit review using our clinic review tool, if applicable. No additional management support is needed unless otherwise documented below in the visit note. 

## 2014-10-06 NOTE — Progress Notes (Signed)
   Subjective:    Patient ID: Shirley [name], female    DOB: 06-19-1934, 80 y.o.   MRN: [id number]  HPI Shirley [name] is a 80 year old married female who comes in today for suture removal  She fell 10 days ago hit her head in a bathtub at home. She had 6 staples put in the emergency room. She comes in for staple removal  She has seen a neurologist in the past. She doesn't recall who that was. She had 2 neurosurgical procedures by Dr. Lovell [name]. He explained to her there was some nerve damage and it wouldn't be unusual that she would fall. I wonder if a neurologic evaluation would be indicated. I hate to see her continue to fall and hurt herself   Review of Systems    review of systems otherwise negative Objective:   Physical Exam  Well-developed well-nourished female no acute distress vital signs stable she's afebrile 6 staples removed from the scalp with no trauma      Assessment & Plan:  Staple removal,,,,,,,,,,,,, neuro consult for evaluation of disbalance

## 2014-10-06 NOTE — Addendum Note (Signed)
Addended by: TODD, JEFFREY A on: 10/06/2014 01:03 PM   Modules accepted: Level of Service

## 2014-10-14 ENCOUNTER — Ambulatory Visit: Payer: Medicare Other | Admitting: Neurology

## 2014-10-20 ENCOUNTER — Ambulatory Visit: Payer: Medicare Other | Admitting: Neurology

## 2014-10-20 ENCOUNTER — Telehealth: Payer: Self-pay | Admitting: Neurology

## 2014-10-20 NOTE — Telephone Encounter (Signed)
Noted  

## 2014-10-20 NOTE — Telephone Encounter (Signed)
Pt called at 9:50AM to cancel her f/u appt for today 10/20/14. Pt's husband had a seizure and will have to go To the hospital with him. Pt r/s to 11/19/14 ay 9:45AM.  C/B  [phone]

## 2014-10-21 NOTE — Telephone Encounter (Signed)
appt marked as no show due to same day cancellation but a no show letter will not be sent / Sherri S.

## 2014-11-19 ENCOUNTER — Ambulatory Visit: Payer: Medicare Other | Admitting: Neurology

## 2014-11-21 ENCOUNTER — Telehealth: Payer: Self-pay | Admitting: Neurology

## 2014-11-21 ENCOUNTER — Encounter: Payer: Self-pay | Admitting: *Deleted

## 2014-11-21 NOTE — Progress Notes (Signed)
No show letter sent for 11/19/2014 

## 2014-11-21 NOTE — Telephone Encounter (Signed)
Pt no showed 11/19/14 appt w/ Dr. Karel JarvisAquino. Appt verbally confirmed appt during reminder calls.  Alcario DroughtErica-  Please send no show letter  / Oneita KrasSherri S.

## 2014-12-15 ENCOUNTER — Encounter: Payer: Self-pay | Admitting: Neurology

## 2014-12-15 ENCOUNTER — Ambulatory Visit (INDEPENDENT_AMBULATORY_CARE_PROVIDER_SITE_OTHER): Payer: Medicare Other | Admitting: Neurology

## 2014-12-15 VITALS — BP 110/78 | HR 108 | Ht 64.0 in | Wt 107.0 lb

## 2014-12-15 DIAGNOSIS — R296 Repeated falls: Secondary | ICD-10-CM

## 2014-12-15 DIAGNOSIS — R2681 Unsteadiness on feet: Secondary | ICD-10-CM

## 2014-12-15 DIAGNOSIS — F419 Anxiety disorder, unspecified: Secondary | ICD-10-CM | POA: Diagnosis not present

## 2014-12-15 DIAGNOSIS — F0390 Unspecified dementia without behavioral disturbance: Secondary | ICD-10-CM

## 2014-12-15 DIAGNOSIS — F03A0 Unspecified dementia, mild, without behavioral disturbance, psychotic disturbance, mood disturbance, and anxiety: Secondary | ICD-10-CM

## 2014-12-15 MED ORDER — CITALOPRAM HYDROBROMIDE 10 MG PO TABS: 10.0000 mg | ORAL_TABLET | Freq: Every day | ORAL | Status: DC

## 2014-12-15 MED ORDER — DONEPEZIL HCL 10 MG PO TABS: 10.0000 mg | ORAL_TABLET | Freq: Every day | ORAL | Status: DC

## 2014-12-15 NOTE — Progress Notes (Signed)
NEUROLOGY FOLLOW UP OFFICE NOTE  Shirley [name] [id number]  HISTORY OF PRESENT ILLNESS: I had the pleasure of seeing Shirley [name] in follow-up in the neurology clinic on 12/15/2014.  The patient was last seen 5 months ago for worsening memory and is again accompanied by his son who supplements the history today.  MOCA score at that time was 24/30, indicating mild cognitive impairment. She had been tearful on initial visit and was started on Celexa. She was started on Aricept 5 months ago. She tolerated the medications well, however did not refill the prescriptions when she ran out of them, thinking she was done with them. She feels Celexa may have helped. She feels her anxiety is situational. She lives with her husband. She continues to drive and denies getting lost, denies any missed medications. Her son reports that she had fallen twice in the last 6 weeks, needing stitches in the back of her head. Her family saw blood in the bathroom and found her awake on the bed. She did not recall that she had fallen until her son reminded her. She had asked where she got to get the stitches. Her son reports that she asks the same questions repeatedly. She continues to drink alcohol but she reports this is "not as much."  She denies any headaches, dizziness, diplopia, dysarthria, dysphagia, focal numbness/tingling/weakness.   HPI: This is a pleasant 81 yo RH woman who presented with memory changes that she started to notice after she fell in November 2014 and sustained a right femur fracture. She reports that she has "never been someone who can remember things," however over the past few months, she feels that "there are a lot of things I can't pull up." She has always been a note-taker, but reports that it takes her a longer time to go through things in her head now. She states that it is "not bad, just insignificant things," but has her worried. She would occasionally misplace things, but does not get turned  around in her home. She lives with her husband and denies being told she repeats things excessively. She occasionally forgets details of conversations. She is in-charge of their financial matters, and does not have any difficulties with these. She does not forget to take her medications. She has occasional word-finding difficulties, and notices that when she reads, she has to go back to the beginning of a sentence. She is slower to follow instructions. She continues to cook and has not left the stove on. She continues to drive and does not get lost or confused. She has no difficulties performing ADLs. There is no family history of dementia.   I personally reviewed MRI brain done 01/2013 which showed diffuse global volume loss with enlarged ventricles, scattered T2 FLAIR hyperintensities. No acute abnormalities seen.  PAST MEDICAL HISTORY: Past Medical History  Diagnosis Date  . Hypertension   . Osteoporosis   . PMR (polymyalgia rheumatica)   . Tobacco abuse   . Glucose intolerance (impaired glucose tolerance)   . Osteoarthritis   . Arthritis     "right shoulder; that's why I had to have it replaced"    MEDICATIONS: Current Outpatient Prescriptions on File Prior to Visit  Medication Sig Dispense Refill  . metoprolol (LOPRESSOR) 50 MG tablet 1 by mouth each bedtime 100 tablet 3   No current facility-administered medications on file prior to visit.    ALLERGIES: No Known Allergies  FAMILY HISTORY: Family History  Problem Relation Age of Onset  .  ALS Father   . Hypertension Sister     SOCIAL HISTORY: History   Social History  . Marital Status: Married    Spouse Name: N/A    Number of Children: 3  . Years of Education: N/A   Occupational History  . retired    Social History Main Topics  . Smoking status: Current Every Day Smoker -- 0.50 packs/day for 40 years    Types: Cigarettes    Last Attempt to Quit: 12/06/1991  . Smokeless tobacco: Never Used  . Alcohol Use: Yes      Comment: occ  . Drug Use: No  . Sexual Activity: No   Other Topics Concern  . Not on file   Social History Narrative   Retired   Married   Alcohol use-no   Drug use-no   Regular Exercise-yes   Current Smoker          REVIEW OF SYSTEMS: Constitutional: No fevers, chills, or sweats, no generalized fatigue, change in appetite Eyes: No visual changes, double vision, eye pain Ear, nose and throat: No hearing loss, ear pain, nasal congestion, sore throat Cardiovascular: No chest pain, palpitations Respiratory:  No shortness of breath at rest or with exertion, wheezes GastrointestinaI: No nausea, vomiting, diarrhea, abdominal pain, fecal incontinence Genitourinary:  No dysuria, urinary retention or frequency Musculoskeletal:  No neck pain, back pain Integumentary: No rash, pruritus, skin lesions Neurological: as above Psychiatric: + depression, insomnia, anxiety Endocrine: No palpitations, fatigue, diaphoresis, mood swings, change in appetite, change in weight, increased thirst Hematologic/Lymphatic:  No anemia, purpura, petechiae. Allergic/Immunologic: no itchy/runny eyes, nasal congestion, recent allergic reactions, rashes  PHYSICAL EXAM: Filed Vitals:   12/15/14 1252  BP: 110/78  Pulse: 108   General: No acute distress Head:  Normocephalic/atraumatic Neck: supple, no paraspinal tenderness, full range of motion Heart:  Regular rate and rhythm Lungs:  Clear to auscultation bilaterally Back: No paraspinal tenderness Skin/Extremities: No rash, no edema Neurological Exam: alert and oriented to person, place, and month. She did not know the date or year. No aphasia or dysarthria. Fund of knowledge is appropriate.  Remote memory intact.  Attention and concentration are normal.    Able to name objects and repeat phrases.  MMSE - Mini Mental State Exam 12/15/2014  Orientation to time 2  Orientation to Place 5  Registration 3  Attention/ Calculation 5  Recall 0  Language- name  2 objects 2  Language- repeat 1  Language- follow 3 step command 3  Language- read & follow direction 1  Write a sentence 1  Copy design 1  Total score 24   Cranial nerves: Pupils equal, round, reactive to light.  Fundoscopic exam unremarkable, no papilledema. Extraocular movements intact with no nystagmus. Visual fields full. Facial sensation intact. No facial asymmetry. Tongue, uvula, palate midline.  Motor: Bulk and tone normal, muscle strength 5/5 throughout with no pronator drift.  Sensation to light touch intact.  No extinction to double simultaneous stimulation.  Deep tendon reflexes 1+ throughout, toes downgoing.  Finger to nose testing intact.  Gait slow and cautious, mild difficulty with tandem walk.  Romberg negative.  IMPRESSION: This is a pleasant 81 yo RH woman with a history of hypertension who presented with memory loss since early 2015. She continues to have worsening memory, she did not recall falling twice in the last 6 weeks, and did not know to refill two of her medications. MMSE today is 24/30, indicating mild dementia. She will restart Celexa and Aricept. I discussed  home safety with the patient and her son, home health evaluation will be requested for medication management and home safety evaluation. She will be referred to PT for gait and balance therapy. We again discussed the importance of physical exercise and brain stimulation exercises for brain health. She will follow-up in 4 months and knows to call our office for any problems.  Thank you for allowing me to participate in her care.  Please do not hesitate to call for any questions or concerns.  The duration of this appointment visit was 25 minutes of face-to-face time with the patient.  Greater than 50% of this time was spent in counseling, explanation of diagnosis, planning of further management, and coordination of care.   Patrcia Dolly, M.D.   CC: Dr. Tawanna Cooler

## 2014-12-15 NOTE — Patient Instructions (Addendum)
1. Restart Celexa 10mg  daily 2. After a week of taking Celexa, restart the Aricept 10mg  daily 3. Home health evaluation for medication management and social work 4. Physical therapy for balance therapy

## 2014-12-18 ENCOUNTER — Encounter: Payer: Self-pay | Admitting: Neurology

## 2014-12-22 ENCOUNTER — Encounter: Payer: Self-pay | Admitting: Physical Therapy

## 2014-12-22 ENCOUNTER — Ambulatory Visit: Payer: Medicare Other | Attending: Neurology | Admitting: Physical Therapy

## 2014-12-22 DIAGNOSIS — R269 Unspecified abnormalities of gait and mobility: Secondary | ICD-10-CM | POA: Diagnosis not present

## 2014-12-22 DIAGNOSIS — R6889 Other general symptoms and signs: Secondary | ICD-10-CM | POA: Diagnosis not present

## 2014-12-22 DIAGNOSIS — R531 Weakness: Secondary | ICD-10-CM

## 2014-12-22 NOTE — Therapy (Signed)
[facility] [address] [place], [place], [address] Phone: [phone]   Fax:  [phone]  Physical Therapy Evaluation  Patient Details  Name: Shirley [name] MRN: [id number] Date of Birth: [birth year] Referring Provider:  Roderick Pee, MD  Encounter Date: 12/22/2014      PT End of Session - 12/22/14 1158    Visit Number 1   Number of Visits 18   Date for PT Re-Evaluation 02/20/15   PT Start Time 1110   PT Stop Time 1145   PT Time Calculation (min) 35 min   Equipment Utilized During Treatment Gait belt   Activity Tolerance Patient tolerated treatment well   Behavior During Therapy [facility] for tasks assessed/performed      Past Medical History  Diagnosis Date  . Hypertension   . Osteoporosis   . PMR (polymyalgia rheumatica)   . Tobacco abuse   . Glucose intolerance (impaired glucose tolerance)   . Osteoarthritis   . Arthritis     "right shoulder; that's why I had to have it replaced"    Past Surgical History  Procedure Laterality Date  . Cataract extraction w/ intraocular lens  implant, bilateral      2008   . Posterior fusion lumbar spine  03/28/12  . Total shoulder replacement  06/2011    right  . Shoulder arthroscopy w/ rotator cuff repair  ~ 01/2011    right  . Vaginal hysterectomy  1970's  . Kyphoplasty N/A 02/28/2013    Procedure: KYPHOPLASTY;  Surgeon: Cristi Loron, MD;  Location: [facility];  Service: Neurosurgery;  Laterality: N/A;  Lumbar two Kyphoplasty  . Hip pinning,cannulated Right 10/20/2013    Procedure: CANNULATED HIP PINNING;  Surgeon: Eldred Manges, MD;  Location: [facility];  Service: Orthopedics;  Laterality: Right;    There were no vitals taken for this visit.  Visit Diagnosis:  Abnormality of gait - Plan: PT plan of care cert/re-cert  Weakness generalized - Plan: PT plan of care cert/re-cert  Activity intolerance - Plan: PT plan of care cert/re-cert      Subjective  Assessment - 12/22/14 1113    Symptoms This 81yo female has history of falls with laceration to scalp recently and hip fx in 2014. Her gait is more unstable and neurologist referred to PT at follow-up appt.   Patient Stated Goals To work on balance & walking with less falls.   Currently in Pain? Yes   Pain Score 4    Pain Location Shoulder   Pain Orientation Right   Pain Descriptors / Indicators Tender   Pain Type Other (Comment)  history of pain /ortho issues but worse since fall   Pain Onset 1 to 4 weeks ago   Pain Frequency Intermittent   Aggravating Factors  fall on it,    Pain Relieving Factors unknown   Multiple Pain Sites No          [facility] PT Assessment - 12/22/14 1100    Assessment   Medical Diagnosis Balance /falls   Precautions   Precautions Fall   Restrictions   Weight Bearing Restrictions No   Balance Screen   Has the patient fallen in the past 6 months Yes   How many times? 1  memory issues /uncertain   Has the patient had a decrease in activity level because of a fear of falling?  No   Is the patient reluctant to leave their home because of a fear of falling?  No  Home Environment   Living Enviornment Private residence   Living Arrangements Spouse/significant other   Type of Home House   Home Access Stairs to enter   Entrance Stairs-Number of Steps 1   Entrance Stairs-Rails None   Home Layout --  split level with 5 steps rt rail to bedrooms & all bathrooms   Home Equipment Shower seat   Prior Function   Level of Independence Independent with basic ADLs;Independent with homemaking with ambulation;Independent with gait;Independent with transfers   Vocation Retired   [employer]   Overall Cognitive Status History of cognitive impairments - at baseline   Memory Impaired   Memory Impairment Storage deficit;Retrieval deficit;Decreased recall of new information   Problem Solving Impaired   Strength   Right Hip Flexion 4/5   Right Hip Extension 3-/5   Right  Hip ABduction 3+/5   Left Hip Flexion 4+/5   Left Hip Extension 3+/5   Left Hip ABduction 4-/5   Right Knee Flexion 4/5   Right Knee Extension 4/5   Left Knee Flexion 4/5   Left Knee Extension 4/5   Right Ankle Dorsiflexion 5/5   Left Ankle Dorsiflexion 5/5   Ambulation/Gait   Ambulation/Gait Yes   Ambulation/Gait Assistance 5: Supervision   Ambulation Distance (Feet) 200 Feet   Assistive device None   Gait Pattern Decreased stride length;Step-through pattern;Decreased hip/knee flexion - right   Ambulation Surface Level;Indoor   Gait velocity 3.08 ft/sec   Berg Balance Test   Sit to Stand Able to stand without using hands and stabilize independently   Standing Unsupported Able to stand safely 2 minutes   Sitting with Back Unsupported but Feet Supported on Floor or Stool Able to sit safely and securely 2 minutes   Stand to Sit Sits safely with minimal use of hands   Transfers Able to transfer safely, minor use of hands   Standing Unsupported with Eyes Closed Able to stand 10 seconds with supervision   Standing Ubsupported with Feet Together Able to place feet together independently and stand 1 minute safely   From Standing, Reach Forward with Outstretched Arm Can reach forward >12 cm safely (5")   From Standing Position, Pick up Object from Floor Able to pick up shoe, needs supervision   From Standing Position, Turn to Look Behind Over each Shoulder Looks behind one side only/other side shows less weight shift   Turn 360 Degrees Able to turn 360 degrees safely but slowly   Standing Unsupported, Alternately Place Feet on Step/Stool Able to complete >2 steps/needs minimal assist   Standing Unsupported, One Foot in Front Able to take small step independently and hold 30 seconds   Standing on One Leg Tries to lift leg/unable to hold 3 seconds but remains standing independently   Total Score 42   Timed Up and Go Test   Normal TUG (seconds) 12.02   Manual TUG (seconds) 12.41   Cognitive  TUG (seconds) 23.71  23.71 math, 18.40 listing                            PT Short Term Goals - 12/22/14 1205    PT SHORT TERM GOAL #1   Title demonstrates initial HEP correctly (Target Date: 01/21/15)   Time 1   Period Months   Status New   PT SHORT TERM GOAL #2   Title Berg Balance >45/56 (Target Date: 01/21/15)   Time 1   Period Months   Status New  PT SHORT TERM GOAL #3   Title Timed Up & Go without device performing cognitive listing task <15 sec. (Target Date: 01/21/15)           PT Long Term Goals - 12/22/14 1210    PT LONG TERM GOAL #1   Title Patient demonstrates [place] Gammon/verbalizes ongoing exercise program (Target Date: 02/20/15)   Time 2   Period Months   Status New   PT LONG TERM GOAL #2   Title Berg Balance >/= 48/56 (Target Date: 02/20/15)   Time 2   Period Months   Status New   PT LONG TERM GOAL #3   Title Timed Up & Go without device with cognitive task of listing in <15 sec. (Target Date: 02/20/15)   Time 2   Period Months   Status New   PT LONG TERM GOAL #4   Title verbalizes using handout for reference 3 fall prevention strategies in home. (Target Date: 02/20/15)   Time 2   Period Months   Status New               Plan - 12/22/14 1159    Clinical Impression Statement This 81yo female with history of osteoporosis has history of falls with injuries. She has a decline in cognition over last few years also. PT testing indicates decreased stregth on right side (orthopedic issues does not appear CVA), Berg Balance Test 42/56, and cognitive Timed Up-Go indicate fall risk.    Pt will benefit from skilled therapeutic intervention in order to improve on the following deficits Abnormal gait;Decreased activity tolerance;Decreased balance;Decreased cognition;Decreased endurance;Decreased mobility;Decreased strength   Rehab Potential Good   PT Frequency 2x / week   PT Duration Other (comment)  60 days (2 months)   PT Treatment/Interventions  ADLs/Self Care Home Management;Gait training;Stair training;Functional mobility training;Therapeutic activities;Therapeutic exercise;Balance training;Neuromuscular re-education;Patient/family education   PT Next Visit Plan Dynamic Gait Index (set goal),    PT Home Exercise Plan Balance /strength standing   Consulted and Agree with Plan of Care Patient          G-Codes - 12/22/14 1214    Functional Assessment Tool Used Berg Balance 42/56   Functional Limitation Mobility: Walking and moving around   Mobility: Walking and Moving Around Current Status [phone]([id number]) At least 40 percent but less than 60 percent impaired, limited or restricted   Mobility: Walking and Moving Around Goal Status [phone]([id number]) At least 20 percent but less than 40 percent impaired, limited or restricted       Problem List Patient Active Problem List   Diagnosis Date Noted  . Visit for suture removal 10/06/2014  . Anxiety state, unspecified 03/06/2014  . Memory loss 03/06/2014  . Short-term memory loss 12/30/2013  . Lumbar degenerative disc disease 08/22/2011  . EXTRINSIC ASTHMA, WITH EXACERBATION 11/03/2009  . GLUCOSE INTOLERANCE 06/09/2008  . POLYMYALGIA RHEUMATICA 05/16/2008  . Essential hypertension 03/12/2008  . OSTEOARTHRITIS 03/12/2008  . MYALGIA 03/12/2008  . OSTEOPOROSIS 03/12/2008    Yatzary Merriweather PT, DPT 12/22/2014, 12:18 PM  [facility] [address] [place], [place], [address] Phone: [phone]   Fax:  [phone]

## 2014-12-24 ENCOUNTER — Telehealth: Payer: Self-pay | Admitting: Neurology

## 2014-12-24 ENCOUNTER — Telehealth: Payer: Self-pay | Admitting: Family Medicine

## 2014-12-24 NOTE — Telephone Encounter (Signed)
Todd, pt's son called wanting to speak to a nurse regarding pt's dosage for her hypertension and dementia and anxiety.  C/b [phone]

## 2014-12-24 NOTE — Telephone Encounter (Signed)
They are unable to service her,  due to her being in outpatient rehab. In order for her to qualify she has to be completely home bound for any of their services. Any other recommendations?

## 2014-12-24 NOTE — Telephone Encounter (Signed)
I spoke with Shirley [name] he just wanted to verify meds that patient was supposed to be on on directions. I did over strengths & directions for patient's Aricept & Celexa Rx's.

## 2014-12-24 NOTE — Telephone Encounter (Signed)
Lmom for Shirley [name] to return my call.

## 2014-12-24 NOTE — Telephone Encounter (Signed)
-----   Message from Shary DecampAndrea S Loye sent at 12/24/2014  1:01 PM EST ----- Regarding: [facility]  Contact: [phone] Okey RegalCarol received the referral for the home health service but since the pt informed that [facility] then [facility] can not do any home health service.   If you have any questions, please call her.

## 2014-12-24 NOTE — Telephone Encounter (Signed)
Tiff can you pls call. Sue Lushndrea, pls send these messages to [place], thanks

## 2014-12-24 NOTE — Telephone Encounter (Signed)
Pls have him contact PCP to discuss social work services for home safety eval. Thanks

## 2014-12-29 ENCOUNTER — Ambulatory Visit: Payer: Medicare Other

## 2014-12-29 DIAGNOSIS — R269 Unspecified abnormalities of gait and mobility: Secondary | ICD-10-CM | POA: Diagnosis not present

## 2014-12-29 DIAGNOSIS — R6889 Other general symptoms and signs: Secondary | ICD-10-CM | POA: Diagnosis not present

## 2014-12-29 DIAGNOSIS — R531 Weakness: Secondary | ICD-10-CM

## 2014-12-29 NOTE — Therapy (Signed)
[facility] [address] [place], [place], [address] Phone: [phone]   Fax:  [phone]  Physical Therapy Treatment  Patient Details  Name: Shirley [name] MRN: [id number] Date of Birth: August 28, 1934 Referring Provider:  Roderick Pee, MD  Encounter Date: 12/29/2014      PT End of Session - 12/29/14 1255    Visit Number 2   Number of Visits 18   Date for PT Re-Evaluation 02/20/15   Authorization Type G-code every 10th visit.   PT Start Time 1111   PT Stop Time 1141   PT Time Calculation (min) 30 min   Equipment Utilized During Treatment Gait belt   Activity Tolerance Patient tolerated treatment well   Behavior During Therapy WFL for tasks assessed/performed      Past Medical History  Diagnosis Date  . Hypertension   . Osteoporosis   . PMR (polymyalgia rheumatica)   . Tobacco abuse   . Glucose intolerance (impaired glucose tolerance)   . Osteoarthritis   . Arthritis     "right shoulder; that's why I had to have it replaced"    Past Surgical History  Procedure Laterality Date  . Cataract extraction w/ intraocular lens  implant, bilateral      2008   . Posterior fusion lumbar spine  03/28/12  . Total shoulder replacement  06/2011    right  . Shoulder arthroscopy w/ rotator cuff repair  ~ 01/2011    right  . Vaginal hysterectomy  1970's  . Kyphoplasty N/A 02/28/2013    Procedure: KYPHOPLASTY;  Surgeon: Cristi Loron, MD;  Location: [facility];  Service: Neurosurgery;  Laterality: N/A;  Lumbar two Kyphoplasty  . Hip pinning,cannulated Right 10/20/2013    Procedure: CANNULATED HIP PINNING;  Surgeon: Eldred Manges, MD;  Location: [facility];  Service: Orthopedics;  Laterality: Right;    There were no vitals taken for this visit.  Visit Diagnosis:  Abnormality of gait  Weakness generalized  Activity intolerance      Subjective Assessment - 12/29/14 1114    Symptoms Pt arrived 10 minutes late to  session. Pt denied any changes or falls since last visit. Pt reported "I feel like my brain is frozen due to this weather, I can't think very well."   Patient Stated Goals To work on balance & walking with less falls.   Currently in Pain? No/denies                    [facility] Adult PT Treatment/Exercise - 12/29/14 1115    Balance   Balance Assessed Yes   Static Standing Balance   Static Standing - Balance Support No upper extremity supported   Static Standing - Level of Assistance 5: Stand by assistance;Other (comment)  min guard   Static Standing - Comment/# of Minutes Pt performed balance activities in corner with chair in front of pt for safety; with B LE for 10-30second hold in 1-2sets/activity: feet together/apart with eyes open/closed, feet apart/together with head turns, tandem stance, single leg stance. VC's for technique.   Dynamic Standing Balance   Dynamic Standing - Balance Support Right upper extremity supported   Dynamic Standing - Level of Assistance 5: Stand by assistance   Dynamic Standing - Balance Activities Other (comment);Head turns;Head nods   Dynamic Standing - Comments With one UE support on counter (4x7'/activity), pt ambulated while performing vertical/horizontal head turns. VC's to improve narrow BOS, stride length, and upright posture.   Standardized  Balance Assessment   Standardized Balance Assessment Dynamic Gait Index   Dynamic Gait Index   Level Surface Mild Impairment   Change in Gait Speed Moderate Impairment  able to amb. at slow pace, but unable to quicken pace   Gait with Horizontal Head Turns Mild Impairment   Gait with Vertical Head Turns Mild Impairment   Gait and Pivot Turn Moderate Impairment   Step Over Obstacle Mild Impairment   Step Around Obstacles Normal   Steps Mild Impairment   Total Score 15                PT Education - 12/29/14 1255    Education provided Yes   Education Details Static/dynamic balance HEP    Person(s) Educated Patient   Methods Explanation;Demonstration;Handout;Verbal cues   Comprehension Verbalized understanding;Returned demonstration;Need further instruction          PT Short Term Goals - 12/29/14 1259    PT SHORT TERM GOAL #1   Title demonstrates initial HEP correctly (Target Date: 01/21/15)   Time 1   Period Months   Status On-going   PT SHORT TERM GOAL #2   Title Berg Balance >45/56 (Target Date: 01/21/15)   Time 1   Period Months   Status On-going   PT SHORT TERM GOAL #3   Title Timed Up & Go without device performing cognitive listing task <15 sec. (Target Date: 01/21/15)   Status On-going   PT SHORT TERM GOAL #4   Title Pt will improve DGI score to >/=18 to reduce falls risk. Target date: 01/21/15.   Status New           PT Long Term Goals - 12/29/14 1259    PT LONG TERM GOAL #1   Title Patient demonstrates Trenton Gammon/verbalizes ongoing exercise program (Target Date: 02/20/15)   Time 2   Period Months   Status On-going   PT LONG TERM GOAL #2   Title Berg Balance >/= 48/56 (Target Date: 02/20/15)   Time 2   Period Months   Status On-going   PT LONG TERM GOAL #3   Title Timed Up & Go without device with cognitive task of listing in <15 sec. (Target Date: 02/20/15)   Time 2   Period Months   Status On-going   PT LONG TERM GOAL #4   Title verbalizes using handout for reference 3 fall prevention strategies in home. (Target Date: 02/20/15)   Time 2   Period Months   Status On-going   PT LONG TERM GOAL #5   Title Pt will improve DGI score to >/=20/24 to reduce falls risk. Target date: 02/20/15.   Status New               Plan - 12/29/14 1256    Clinical Impression Statement Pt scored a 15/24 on DGI, indicating she at a risk for falls. Pt noted to experience difficulty with steps, step-over, and head turns during ambulation. Pt would benefit from review of balance HEP, due to impaired cognition. Pt would continue to benefit from skilled PT to improve safety  during functional mobility.   Pt will benefit from skilled therapeutic intervention in order to improve on the following deficits Abnormal gait;Decreased activity tolerance;Decreased balance;Decreased cognition;Decreased endurance;Decreased mobility;Decreased strength   Rehab Potential Good   Clinical Impairments Affecting Rehab Potential Impaired cognition   PT Frequency 2x / week   PT Duration Other (comment)  60 days (2 months)   PT Treatment/Interventions ADLs/Self Care Home Management;Gait training;Stair training;Functional mobility  training;Therapeutic activities;Therapeutic exercise;Balance training;Neuromuscular re-education;Patient/family education   PT Next Visit Plan Review balance HEP, initiate strengthening HEP.   PT Home Exercise Plan Balance HEP.   Consulted and Agree with Plan of Care Patient        Problem List Patient Active Problem List   Diagnosis Date Noted  . Visit for suture removal 10/06/2014  . Anxiety state, unspecified 03/06/2014  . Memory loss 03/06/2014  . Short-term memory loss 12/30/2013  . Lumbar degenerative disc disease 08/22/2011  . EXTRINSIC ASTHMA, WITH EXACERBATION 11/03/2009  . GLUCOSE INTOLERANCE 06/09/2008  . POLYMYALGIA RHEUMATICA 05/16/2008  . Essential hypertension 03/12/2008  . OSTEOARTHRITIS 03/12/2008  . MYALGIA 03/12/2008  . OSTEOPOROSIS 03/12/2008    Vikram Tillett L 12/29/2014, 1:01 PM  [facility] [address] [place], [place], [address] Phone: [phone]   Fax:  [phone]     Zerita Boers, PT,DPT 12/29/2014 1:01 PM Phone: [phone] Fax: [phone]

## 2014-12-29 NOTE — Telephone Encounter (Signed)
I called again & did speak with Tawanna Coolerodd. Did give Dr. Rosalyn GessAquino's advisement about pcp doing a social work referral. He verbalized good understanding.

## 2014-12-29 NOTE — Patient Instructions (Signed)
Perform balance exercises in corner with chair in front of you for safety:  Feet Together, Head Motion - Eyes Open   With eyes open, feet together, move head slowly: up and down and side to side for 30 seconds. Repeat __3__ times per session. Do __1__ sessions per day.  Copyright  VHI. All rights reserved.  Feet Together, Varied Arm Positions - Eyes Closed   Stand with feet together and arms at your side. Close eyes and visualize upright position. Hold __15-30__ seconds. Repeat _3___ times per session. Do __1__ sessions per day.  Copyright  VHI. All rights reserved.  Feet Heel-Toe "Tandem", Varied Arm Positions - Eyes Open   With eyes open, right foot directly in front of the other, arms at your side, look straight ahead at a stationary object. Hold _10-30___ seconds. Repeat with left foot in front. Repeat __3__ times per leg per session. Do __1__ sessions per day.  Copyright  VHI. All rights reserved.  Single Leg - Eyes Open   Holding support, lift right leg while maintaining balance over other leg. Repeat with other leg. Progress to removing hands from support surface for longer periods of time. Hold_10-30___ seconds. Repeat _3___ times per leg per session. Do _1___ sessions per day.  Copyright  VHI. All rights reserved.  Side to Side Head Motion   Perform while holding onto counter with one hand, progress by removing hand from counter.. Walking on solid surface, turn head and eyes to left for _2___ steps. Then, turn head and eyes straight ahead for _2___ steps, along a counter space of 7 to 10 feet long. Repeat sequence __4__ times per session. Do __1__ sessions per day.   Copyright  VHI. All rights reserved.  Up / Down Head Motion   Perform while holding onto counter with one hand. Walking on solid surface, move head and eyes toward ceiling for _2___ steps. Then, move head and eyes straight ahead for _2___ steps.  Repeat __4__ times per session. Do __1__ sessions  per day.   Copyright  VHI. All rights reserved.

## 2014-12-31 ENCOUNTER — Ambulatory Visit: Payer: Medicare Other | Admitting: Physical Therapy

## 2015-01-13 DIAGNOSIS — Z0279 Encounter for issue of other medical certificate: Secondary | ICD-10-CM

## 2015-01-14 ENCOUNTER — Ambulatory Visit: Payer: Medicare Other | Attending: Neurology | Admitting: Physical Therapy

## 2015-01-14 DIAGNOSIS — R6889 Other general symptoms and signs: Secondary | ICD-10-CM | POA: Insufficient documentation

## 2015-01-14 DIAGNOSIS — R269 Unspecified abnormalities of gait and mobility: Secondary | ICD-10-CM | POA: Insufficient documentation

## 2015-01-14 DIAGNOSIS — R531 Weakness: Secondary | ICD-10-CM | POA: Insufficient documentation

## 2015-01-16 ENCOUNTER — Encounter: Payer: Self-pay | Admitting: Physical Therapy

## 2015-01-16 ENCOUNTER — Ambulatory Visit: Payer: Medicare Other | Admitting: Physical Therapy

## 2015-01-16 DIAGNOSIS — R269 Unspecified abnormalities of gait and mobility: Secondary | ICD-10-CM

## 2015-01-16 DIAGNOSIS — R531 Weakness: Secondary | ICD-10-CM

## 2015-01-16 DIAGNOSIS — R6889 Other general symptoms and signs: Secondary | ICD-10-CM

## 2015-01-16 NOTE — Patient Instructions (Signed)
Bridging   Slowly raise buttocks from floor, keeping stomach tight. Hold for 5 seconds. Repeat _10___ times per set. Do ___1-2_ sessions per day.  http://orth.exer.us/1096   Copyright  VHI. All rights reserved.  Hip Flexor Stretch   Lying on back near edge of bed, with both knees bent. Lift leg on edge of bed off bed to floor and then back up. 10 time.  Do _1-2___ sessions per day.    http://[email or web address].exer.us/347   Copyright  VHI. All rights reserved.  Toe / Heel Raise   Hold onto kitchen counter top: ently rock back on heels and raise toes. Then rock forward on toes and raise heels. Repeat sequence __10__ times per session. Do __1-2__ sessions per week.  Copyright  VHI. All rights reserved.  Standing Hip Adbduction   Hold onto kitchen counter top: Lift leg out to side, bring back to midline. Repeat with other leg, alternating legs. Repeat _10___ times. Do 1-2__ sessions per day.  http://gt2.exer.us/393   Copyright  VHI. All rights reserved.  Standing Hip Extension   Hold onto kitchen counter top: Bring leg back as far as possible, keeping knee straight. Repeat with other leg, alternating legs. Repeat __10__ times. Do __1-2__ sessions per day.  http://[email or web address].exer.us/395   Copyright  VHI. All rights reserved.  Functional Quadriceps: Sit to Stand   Sit on edge of chair, feet flat on floor. Stand upright, extending knees fully. Try not to use arms. Repeat _10___ times per set. Do ___1_ sets per session. Do __1-2__ sessions per day.  http://orth.exer.us/735   Copyright  VHI. All rights reserved.

## 2015-01-16 NOTE — Therapy (Signed)
[facility] [address] [place], [place], [address] Phone: [phone]   Fax:  [phone]  Physical Therapy Treatment  Patient Details  Name: Shirley [name] MRN: [id number] Date of Birth: [birth year] Referring Provider:  Roderick Pee, MD  Encounter Date: 01/16/2015      PT End of Session - 01/16/15 1106    Visit Number 3   Number of Visits 18   Date for PT Re-Evaluation 02/20/15   Authorization Type G-code every 10th visit.   PT Start Time 1102   PT Stop Time 1147   PT Time Calculation (min) 45 min   Equipment Utilized During Treatment Gait belt   Activity Tolerance Patient tolerated treatment well   Behavior During Therapy WFL for tasks assessed/performed      Past Medical History  Diagnosis Date  . Hypertension   . Osteoporosis   . PMR (polymyalgia rheumatica)   . Tobacco abuse   . Glucose intolerance (impaired glucose tolerance)   . Osteoarthritis   . Arthritis     "right shoulder; that's why I had to have it replaced"    Past Surgical History  Procedure Laterality Date  . Cataract extraction w/ intraocular lens  implant, bilateral      2008   . Posterior fusion lumbar spine  03/28/12  . Total shoulder replacement  06/2011    right  . Shoulder arthroscopy w/ rotator cuff repair  ~ 01/2011    right  . Vaginal hysterectomy  1970's  . Kyphoplasty N/A 02/28/2013    Procedure: KYPHOPLASTY;  Surgeon: Cristi Loron, MD;  Location: [facility];  Service: Neurosurgery;  Laterality: N/A;  Lumbar two Kyphoplasty  . Hip pinning,cannulated Right 10/20/2013    Procedure: CANNULATED HIP PINNING;  Surgeon: Eldred Manges, MD;  Location: [facility];  Service: Orthopedics;  Laterality: Right;    There were no vitals taken for this visit.  Visit Diagnosis:  Abnormality of gait  Weakness generalized  Activity intolerance      Subjective Assessment - 01/16/15 1105    Symptoms No new complaints. No pain or  new falls to report.   Pain Score 0-No pain     Treatment:  Neuro Re-ed - in corner with feet together on floor: with eyes open head movements up/down, left/right and diagonals both ways; eyes closed with no head movements; min guard assist with cues on exercise form/techinque - tandem stance in corner: hold 10 sec's x 3 reps each foot forward - in corner on 2 pillows with feet together: eyes closed hold 10 sec's x 3; eyes closed head movements up/down, left/right and diagonals both ways. Min assist for balance with no UE support. - at counter: single leg stance hold 10 sec's x 3 each leg; gait along counter with head turns x 2 laps, with head nods x 2 laps with min guard assist and 1 UE support on counter  Exercise: - at counter: heel/toe raises x 10 reps; alternating hip abduction x 10 each side. Cues on posture and ex form - sit/stands with minimal UE assist x 10 reps. Cues on full upright posture with each stand and anterior weight shifting to assist with standing. - supine: bridge 5 sec hold x 10 reps; hip/knee flexion off and back onto mat x 10 each side. - Scifit x 4 extremities level 2.5 x 8 minutes with goal rpm >/= 50 for strengthening and activity tolerance        PT Education -  01/16/15 1530    Education provided Yes   Education Details strengthening HEP   Person(s) Educated Patient   Methods Explanation;Verbal cues;Handout   Comprehension Verbalized understanding;Returned demonstration;Need further instruction          PT Short Term Goals - 12/29/14 1259    PT SHORT TERM GOAL #1   Title demonstrates initial HEP correctly (Target Date: 01/21/15)   Time 1   Period Months   Status On-going   PT SHORT TERM GOAL #2   Title Berg Balance >45/56 (Target Date: 01/21/15)   Time 1   Period Months   Status On-going   PT SHORT TERM GOAL #3   Title Timed Up & Go without device performing cognitive listing task <15 sec. (Target Date: 01/21/15)   Status On-going   PT SHORT  TERM GOAL #4   Title Pt will improve DGI score to >/=18 to reduce falls risk. Target date: 01/21/15.   Status New           PT Long Term Goals - 12/29/14 1259    PT LONG TERM GOAL #1   Title Patient demonstrates Trenton Gammon/verbalizes ongoing exercise program (Target Date: 02/20/15)   Time 2   Period Months   Status On-going   PT LONG TERM GOAL #2   Title Berg Balance >/= 48/56 (Target Date: 02/20/15)   Time 2   Period Months   Status On-going   PT LONG TERM GOAL #3   Title Timed Up & Go without device with cognitive task of listing in <15 sec. (Target Date: 02/20/15)   Time 2   Period Months   Status On-going   PT LONG TERM GOAL #4   Title verbalizes using handout for reference 3 fall prevention strategies in home. (Target Date: 02/20/15)   Time 2   Period Months   Status On-going   PT LONG TERM GOAL #5   Title Pt will improve DGI score to >/=20/24 to reduce falls risk. Target date: 02/20/15.   Status New           Plan - 01/16/15 1106    Clinical Impression Statement Pt needed cues to perform current HEP correctly. Added strengtheing exercises to HEP today without issues reported by patient. Progressing toward goals.   Pt will benefit from skilled therapeutic intervention in order to improve on the following deficits Abnormal gait;Decreased activity tolerance;Decreased balance;Decreased cognition;Decreased endurance;Decreased mobility;Decreased strength   Rehab Potential Good   Clinical Impairments Affecting Rehab Potential Impaired cognition   PT Frequency 2x / week   PT Duration Other (comment)  60 days (2 months)   PT Treatment/Interventions ADLs/Self Care Home Management;Gait training;Stair training;Functional mobility training;Therapeutic activities;Therapeutic exercise;Balance training;Neuromuscular re-education;Patient/family education   PT Next Visit Plan Continue with strengtheing and balance activities. Assess STG's next week   PT Home Exercise Plan Balance HEP.    Consulted and Agree with Plan of Care Patient     Problem List Patient Active Problem List   Diagnosis Date Noted  . Visit for suture removal 10/06/2014  . Anxiety state, unspecified 03/06/2014  . Memory loss 03/06/2014  . Short-term memory loss 12/30/2013  . Lumbar degenerative disc disease 08/22/2011  . EXTRINSIC ASTHMA, WITH EXACERBATION 11/03/2009  . GLUCOSE INTOLERANCE 06/09/2008  . POLYMYALGIA RHEUMATICA 05/16/2008  . Essential hypertension 03/12/2008  . OSTEOARTHRITIS 03/12/2008  . MYALGIA 03/12/2008  . OSTEOPOROSIS 03/12/2008    Sallyanne KusterBury, Herchel Hopkin 01/16/2015, 3:31 PM  Sallyanne KusterKathy Antaeus Karel, PTA, CLT [facility] [address] [place] [address] [phone] 01/16/2015, 3:31 PM

## 2015-01-19 ENCOUNTER — Ambulatory Visit: Payer: Medicare Other | Admitting: Physical Therapy

## 2015-01-21 ENCOUNTER — Ambulatory Visit: Payer: Medicare Other | Admitting: Physical Therapy

## 2015-01-26 ENCOUNTER — Encounter: Payer: Medicare Other | Admitting: Physical Therapy

## 2015-01-28 ENCOUNTER — Ambulatory Visit: Payer: Medicare Other | Admitting: Physical Therapy

## 2015-02-24 DIAGNOSIS — H4011X2 Primary open-angle glaucoma, moderate stage: Secondary | ICD-10-CM | POA: Diagnosis not present

## 2015-03-18 DIAGNOSIS — H4011X2 Primary open-angle glaucoma, moderate stage: Secondary | ICD-10-CM | POA: Diagnosis not present

## 2015-03-25 DIAGNOSIS — H4011X2 Primary open-angle glaucoma, moderate stage: Secondary | ICD-10-CM | POA: Diagnosis not present

## 2015-04-15 ENCOUNTER — Ambulatory Visit: Payer: Medicare Other | Admitting: Neurology

## 2015-04-17 ENCOUNTER — Ambulatory Visit: Payer: Medicare Other | Admitting: Neurology

## 2015-05-08 ENCOUNTER — Encounter: Payer: Self-pay | Admitting: Neurology

## 2015-05-08 ENCOUNTER — Ambulatory Visit (INDEPENDENT_AMBULATORY_CARE_PROVIDER_SITE_OTHER): Payer: Medicare Other | Admitting: Neurology

## 2015-05-08 VITALS — BP 140/76 | HR 91 | Ht 63.5 in | Wt 107.1 lb

## 2015-05-08 DIAGNOSIS — F0390 Unspecified dementia without behavioral disturbance: Secondary | ICD-10-CM | POA: Diagnosis not present

## 2015-05-08 DIAGNOSIS — F03A0 Unspecified dementia, mild, without behavioral disturbance, psychotic disturbance, mood disturbance, and anxiety: Secondary | ICD-10-CM

## 2015-05-08 DIAGNOSIS — F419 Anxiety disorder, unspecified: Secondary | ICD-10-CM

## 2015-05-08 DIAGNOSIS — R296 Repeated falls: Secondary | ICD-10-CM

## 2015-05-08 MED ORDER — CITALOPRAM HYDROBROMIDE 10 MG PO TABS: ORAL_TABLET | ORAL | Status: DC

## 2015-05-08 MED ORDER — DONEPEZIL HCL 10 MG PO TABS: 10.0000 mg | ORAL_TABLET | Freq: Every day | ORAL | Status: DC

## 2015-05-08 NOTE — Patient Instructions (Addendum)
1. Increase Celexa 10mg : take 2 tablets daily 2. Continue Aricept 10mg :Take 1 tablet daily 3. Physical exercise and brain stimulation exercises are important for brain health 4. Follow-up in 5-6 months, call for any problems

## 2015-05-08 NOTE — Progress Notes (Signed)
NEUROLOGY FOLLOW UP OFFICE NOTE  Shirley BealBarbara B [name] [id number]  HISTORY OF PRESENT ILLNESS: I had the pleasure of seeing Shirley [name] in follow-up in the neurology clinic on 05/08/2015.  The patient was last seen 5 months ago for mild dementia. She is again accompanied by her son who helps supplement the history today. She had missed her appointment in May because she drove and could not find her way to the office. Otherwise she denies getting lost driving in familiar places. She denies any missed bill payments, no difficulties with ADLs. Her husband has been sick recently and this has caused more stress at home. They are awaiting processing of application for assisted living. Her son reports the short-term memory problems are unchanged, but he has noticed she is asking the same questions more frequently. She continues to have falls, last fall was 2 weeks ago. She fell twice in March but could not recall this. She had been to PT last February but has not been back since. She denies any headaches, vision changes, focal numbness/tingling/weakness, bowel/bladder dysfunction. She has occasional dizziness on standing. She may be dizzy when she falls, per patient. She continues to feel anxiety on a constant basis, more with her husband sick. Her son reports his father gets frustrated with her memory problems.  HPI: This is a pleasant 81 yo RH woman who presented with memory changes that she started to notice after she fell in November 2014 and sustained a right femur fracture. She reports that she has "never been someone who can remember things," however over the past few months, she feels that "there are a lot of things I can't pull up." She has always been a note-taker, but reports that it takes her a longer time to go through things in her head now. She states that it is "not bad, just insignificant things," but has her worried. She would occasionally misplace things, but does not get turned around in her  home. She lives with her husband and denies being told she repeats things excessively. She occasionally forgets details of conversations. She is in-charge of their financial matters, and does not have any difficulties with these. She does not forget to take her medications. She has occasional word-finding difficulties, and notices that when she reads, she has to go back to the beginning of a sentence. She is slower to follow instructions. She continues to cook and has not left the stove on. She continues to drive and does not get lost or confused. She has no difficulties performing ADLs. There is no family history of dementia. MOCA score in April 2015 was 24/30. MMSE in January 2016 was 24/30.  I personally reviewed MRI brain done 01/2013 which showed diffuse global volume loss with enlarged ventricles, scattered T2 FLAIR hyperintensities. No acute abnormalities seen.  PAST MEDICAL HISTORY: Past Medical History  Diagnosis Date  . Hypertension   . Osteoporosis   . PMR (polymyalgia rheumatica)   . Tobacco abuse   . Glucose intolerance (impaired glucose tolerance)   . Osteoarthritis   . Arthritis     "right shoulder; that's why I had to have it replaced"    MEDICATIONS: Current Outpatient Prescriptions on File Prior to Visit  Medication Sig Dispense Refill  . citalopram (CELEXA) 10 MG tablet Take 1 tablet (10 mg total) by mouth daily. 90 tablet 3  . donepezil (ARICEPT) 10 MG tablet Take 1 tablet (10 mg total) by mouth at bedtime. 90 tablet 3  . metoprolol (LOPRESSOR) 50  MG tablet 1 by mouth each bedtime 100 tablet 3   No current facility-administered medications on file prior to visit.    ALLERGIES: No Known Allergies  FAMILY HISTORY: Family History  Problem Relation Age of Onset  . ALS Father   . Hypertension Sister     SOCIAL HISTORY: History   Social History  . Marital Status: Married    Spouse Name: N/A  . Number of Children: 3  . Years of Education: N/A   Occupational  History  . retired    Social History Main Topics  . Smoking status: Current Every Day Smoker -- 0.50 packs/day for 40 years    Types: Cigarettes    Last Attempt to Quit: 12/06/1991  . Smokeless tobacco: Never Used  . Alcohol Use: Yes     Comment: occ  . Drug Use: No  . Sexual Activity: No   Other Topics Concern  . Not on file   Social History Narrative   Retired   Married   Alcohol use-no   Drug use-no   Regular Exercise-yes   Current Smoker          REVIEW OF SYSTEMS: Constitutional: No fevers, chills, or sweats, no generalized fatigue, change in appetite Eyes: No visual changes, double vision, eye pain Ear, nose and throat: No hearing loss, ear pain, nasal congestion, sore throat Cardiovascular: No chest pain, palpitations Respiratory:  No shortness of breath at rest or with exertion, wheezes GastrointestinaI: No nausea, vomiting, diarrhea, abdominal pain, fecal incontinence Genitourinary:  No dysuria, urinary retention or frequency Musculoskeletal:  No neck pain, back pain Integumentary: No rash, pruritus, skin lesions Neurological: as above Psychiatric: No depression, insomnia, +anxiety Endocrine: No palpitations, fatigue, diaphoresis, mood swings, change in appetite, change in weight, increased thirst Hematologic/Lymphatic:  No anemia, purpura, petechiae. Allergic/Immunologic: no itchy/runny eyes, nasal congestion, recent allergic reactions, rashes  PHYSICAL EXAM: Filed Vitals:   05/08/15 1249  BP: 140/76  Pulse: 91   General: No acute distress Head:  Normocephalic/atraumatic Neck: supple, no paraspinal tenderness, full range of motion Heart:  Regular rate and rhythm Lungs:  Clear to auscultation bilaterally Back: No paraspinal tenderness Skin/Extremities: No rash, no edema Neurological Exam: alert and oriented to person, place, and time. No aphasia or dysarthria. Fund of knowledge is appropriate.  Remote memory intact.  Attention and concentration are  normal.    Able to name objects and repeat phrases.  MMSE - Mini Mental State Exam 05/08/2015 12/15/2014  Orientation to time 5 2  Orientation to Place 5 5  Registration 3 3  Attention/ Calculation 5 5  Recall 0 0  Language- name 2 objects 2 2  Language- repeat 1 1  Language- follow 3 step command 2 3  Language- read & follow direction 1 1  Write a sentence 1 1  Copy design 1 1  Total score 26 24   Cranial nerves: Pupils equal, round, reactive to light.  Fundoscopic exam unremarkable, no papilledema. Extraocular movements intact with no nystagmus. Visual fields full. Facial sensation intact. No facial asymmetry. Tongue, uvula, palate midline.  Motor: Bulk and tone normal, muscle strength 5/5 throughout with no pronator drift.  Sensation to light touch intact.  No extinction to double simultaneous stimulation.  Deep tendon reflexes 1+ throughout, toes downgoing.  Finger to nose testing intact.  Gait narrow-based and steady, able to tandem walk adequately.  Romberg negative.  IMPRESSION: This is a pleasant 81 yo RH woman with a history of hypertension who presented with memory loss  since early 2015.MMSE in January 2016 was 24/30, it is 26/30 today with difficulties with recall. Her son is concerned about increasingly repeating herself. Symptoms suggestive of mild dementia, likely Alzheimer's type. We discussed indications for medications, at this time, would continue Aricept  daily. We discussed anxiety and effects on memory, son does recognize worsening of symptoms since her husband has been sick. She also reports anxiety, and will increase Celexa to  daily. They are looking into assisted living facilities, which I believe will be beneficial for her. We again discussed the importance of physical exercise and brain stimulation exercises for brain health. She has frequent falls but stopped PT 4 months ago. She does not feel she needs it right now. She will follow-up in 5-6 months and knows to  call our office for any problems.  Thank you for allowing me to participate in her care.  Please do not hesitate to call for any questions or concerns.  The duration of this appointment visit was 15 minutes of face-to-face time with the patient.  Greater than 50% of this time was spent in counseling, explanation of diagnosis, planning of further management, and coordination of care.   Patrcia Dolly, M.D.   CC: Dr. Tawanna Cooler

## 2015-06-10 ENCOUNTER — Ambulatory Visit: Payer: Medicare Other | Admitting: Neurology

## 2015-07-01 IMAGING — CT CT CERVICAL SPINE W/O CM
2 of 4 series · 5 of 14 positions shown, 6 images · non-contrast
Comparison: CT head 02/04/2013.  MRI brain 01/30/2013.

CLINICAL DATA: Loss of consciousness. Presumed Fall. Patient was in
bed with laceration to the left side of there head and blood was
found on the bathroom.

EXAM:
CT HEAD WITHOUT CONTRAST
CT CERVICAL SPINE WITHOUT CONTRAST
TECHNIQUE: Multidetector CT imaging of the head and cervical spine was
performed following the standard protocol without intravenous
contrast. Multiplanar CT image reconstructions of the cervical spine
were also generated.

[Series 5: c-spine st · axial · 0.26mm/px · z∈[-159,-105]mm · 2 of 81 slices shown]
[im 27/81  bone]
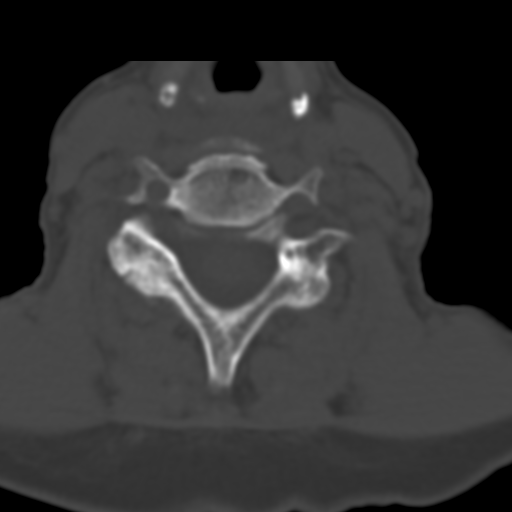
[im 54/81  bone]
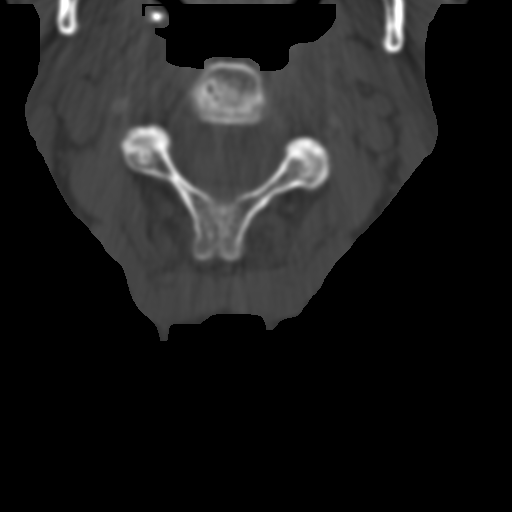

[Series 7: axial recon · axial · 0.23mm/px · z∈[-194,-117]mm · 3 of 88 slices shown, 4 images]
[im 22/88  soft-tissue]
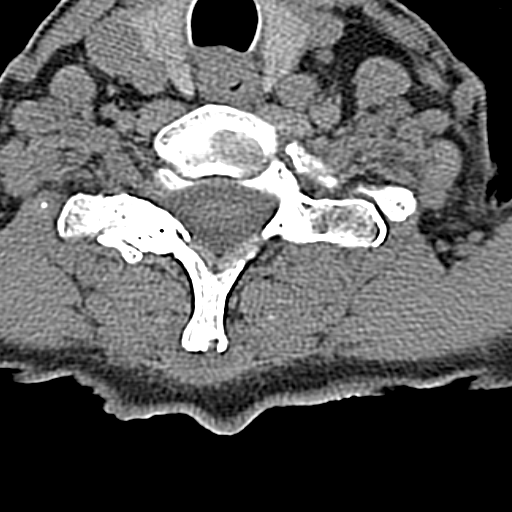
[im 22/88  bone]
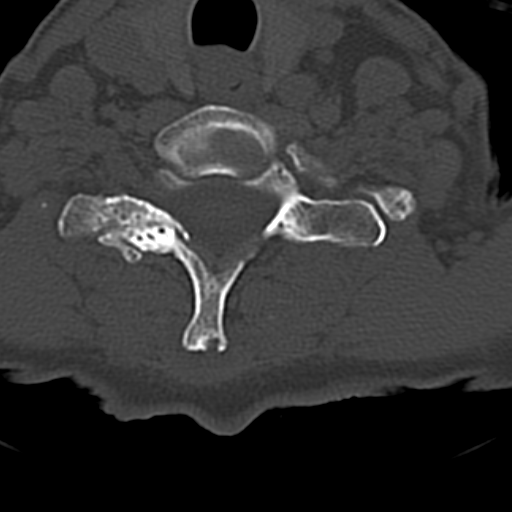
[im 44/88  bone]
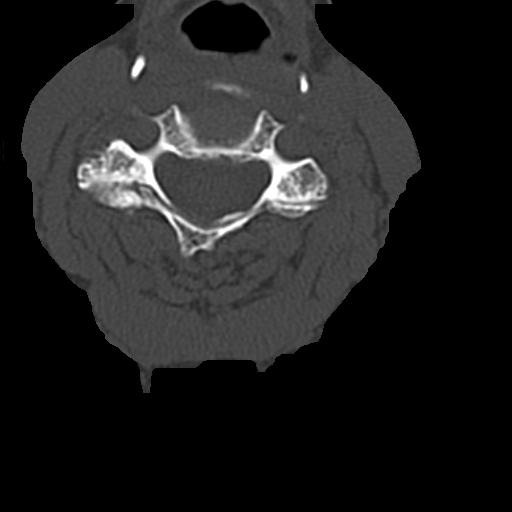
[im 66/88  bone]
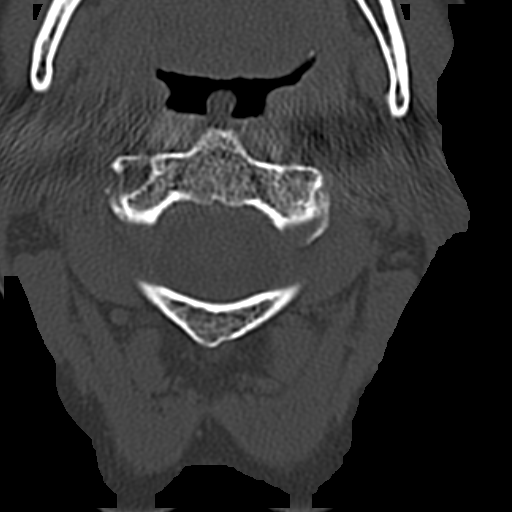

[5 of 14 positions shown; findings below may reference images not displayed]

FINDINGS: CT HEAD FINDINGS

Subcutaneous scalp hematoma over the left posterior parietal region.
Diffuse cerebral atrophy. Mild ventricular dilatation consistent
with central atrophy. Low-attenuation changes in the deep white
matter consistent small vessel ischemic change. No mass effect or
midline shift. No abnormal extra-axial fluid collections. Gray-white
matter junctions are distinct. Basal cisterns are not effaced. No
evidence of acute intracranial hemorrhage. No depressed skull
fractures. Mucosal thickening in the paranasal sinuses. Mastoid air
cells are not opacified.

CT CERVICAL SPINE FINDINGS

Normal alignment of the cervical spine and facet joints. C1-2
articulation appears intact. Diffuse degenerative changes throughout
the cervical spine with narrowed cervical interspaces and associated
endplate hypertrophic changes. Limbus vertebrae at C5 and C6.
Diffuse degenerative change throughout the cervical facet joints. No
vertebral compression deformities. No prevertebral soft tissue
swelling. No focal bone lesion or bone destruction. Soft tissues are
unremarkable. Vascular calcifications in the cervical carotid
arteries.
IMPRESSION: No acute intracranial abnormalities. Diffuse degenerative change
throughout the cervical spine. Normal alignment. No displaced
fractures identified.

## 2015-07-04 ENCOUNTER — Inpatient Hospital Stay (HOSPITAL_COMMUNITY)
Admission: EM | Admit: 2015-07-04 | Discharge: 2015-07-06 | DRG: 184 | Disposition: A | Discharge status: Home Health | Payer: Medicare Other | Attending: Internal Medicine | Admitting: Internal Medicine

## 2015-07-04 ENCOUNTER — Emergency Department (HOSPITAL_COMMUNITY): Payer: Medicare Other

## 2015-07-04 ENCOUNTER — Encounter (HOSPITAL_COMMUNITY): Payer: Self-pay

## 2015-07-04 DIAGNOSIS — W19XXXA Unspecified fall, initial encounter: Secondary | ICD-10-CM

## 2015-07-04 DIAGNOSIS — R296 Repeated falls: Secondary | ICD-10-CM | POA: Diagnosis present

## 2015-07-04 DIAGNOSIS — Z8249 Family history of ischemic heart disease and other diseases of the circulatory system: Secondary | ICD-10-CM

## 2015-07-04 DIAGNOSIS — W010XXA Fall on same level from slipping, tripping and stumbling without subsequent striking against object, initial encounter: Secondary | ICD-10-CM | POA: Diagnosis present

## 2015-07-04 DIAGNOSIS — F0390 Unspecified dementia without behavioral disturbance: Secondary | ICD-10-CM | POA: Diagnosis present

## 2015-07-04 DIAGNOSIS — S270XXA Traumatic pneumothorax, initial encounter: Secondary | ICD-10-CM | POA: Diagnosis present

## 2015-07-04 DIAGNOSIS — M150 Primary generalized (osteo)arthritis: Secondary | ICD-10-CM | POA: Diagnosis not present

## 2015-07-04 DIAGNOSIS — S299XXA Unspecified injury of thorax, initial encounter: Secondary | ICD-10-CM | POA: Diagnosis not present

## 2015-07-04 DIAGNOSIS — S3993XA Unspecified injury of pelvis, initial encounter: Secondary | ICD-10-CM | POA: Diagnosis not present

## 2015-07-04 DIAGNOSIS — S32302D Unspecified fracture of left ilium, subsequent encounter for fracture with routine healing: Secondary | ICD-10-CM | POA: Diagnosis not present

## 2015-07-04 DIAGNOSIS — M353 Polymyalgia rheumatica: Secondary | ICD-10-CM | POA: Diagnosis not present

## 2015-07-04 DIAGNOSIS — R269 Unspecified abnormalities of gait and mobility: Secondary | ICD-10-CM | POA: Diagnosis not present

## 2015-07-04 DIAGNOSIS — M810 Age-related osteoporosis without current pathological fracture: Secondary | ICD-10-CM | POA: Diagnosis present

## 2015-07-04 DIAGNOSIS — S2249XA Multiple fractures of ribs, unspecified side, initial encounter for closed fracture: Secondary | ICD-10-CM | POA: Diagnosis not present

## 2015-07-04 DIAGNOSIS — R7302 Impaired glucose tolerance (oral): Secondary | ICD-10-CM | POA: Diagnosis present

## 2015-07-04 DIAGNOSIS — Z79899 Other long term (current) drug therapy: Secondary | ICD-10-CM | POA: Diagnosis not present

## 2015-07-04 DIAGNOSIS — Z96611 Presence of right artificial shoulder joint: Secondary | ICD-10-CM | POA: Diagnosis not present

## 2015-07-04 DIAGNOSIS — I10 Essential (primary) hypertension: Secondary | ICD-10-CM | POA: Diagnosis not present

## 2015-07-04 DIAGNOSIS — S2242XA Multiple fractures of ribs, left side, initial encounter for closed fracture: Principal | ICD-10-CM | POA: Diagnosis present

## 2015-07-04 DIAGNOSIS — S2232XA Fracture of one rib, left side, initial encounter for closed fracture: Secondary | ICD-10-CM

## 2015-07-04 DIAGNOSIS — S199XXA Unspecified injury of neck, initial encounter: Secondary | ICD-10-CM | POA: Diagnosis not present

## 2015-07-04 DIAGNOSIS — R54 Age-related physical debility: Secondary | ICD-10-CM | POA: Diagnosis not present

## 2015-07-04 DIAGNOSIS — S2239XA Fracture of one rib, unspecified side, initial encounter for closed fracture: Secondary | ICD-10-CM | POA: Diagnosis present

## 2015-07-04 DIAGNOSIS — F17210 Nicotine dependence, cigarettes, uncomplicated: Secondary | ICD-10-CM | POA: Diagnosis present

## 2015-07-04 DIAGNOSIS — M4856XA Collapsed vertebra, not elsewhere classified, lumbar region, initial encounter for fracture: Secondary | ICD-10-CM | POA: Diagnosis not present

## 2015-07-04 DIAGNOSIS — J90 Pleural effusion, not elsewhere classified: Secondary | ICD-10-CM | POA: Diagnosis not present

## 2015-07-04 DIAGNOSIS — S0990XA Unspecified injury of head, initial encounter: Secondary | ICD-10-CM | POA: Diagnosis not present

## 2015-07-04 DIAGNOSIS — S3992XA Unspecified injury of lower back, initial encounter: Secondary | ICD-10-CM | POA: Diagnosis not present

## 2015-07-04 DIAGNOSIS — M545 Low back pain: Secondary | ICD-10-CM | POA: Diagnosis not present

## 2015-07-04 DIAGNOSIS — M6080 Other myositis, unspecified site: Secondary | ICD-10-CM | POA: Diagnosis not present

## 2015-07-04 DIAGNOSIS — S32000A Wedge compression fracture of unspecified lumbar vertebra, initial encounter for closed fracture: Secondary | ICD-10-CM | POA: Diagnosis not present

## 2015-07-04 DIAGNOSIS — J939 Pneumothorax, unspecified: Secondary | ICD-10-CM

## 2015-07-04 DIAGNOSIS — S3991XA Unspecified injury of abdomen, initial encounter: Secondary | ICD-10-CM | POA: Diagnosis not present

## 2015-07-04 LAB — CBC WITH DIFFERENTIAL/PLATELET
Basophils Absolute: 0.1 10*3/uL (ref 0.0–0.1)
Basophils Relative: 1 % (ref 0–1)
EOS ABS: 0 10*3/uL (ref 0.0–0.7)
EOS PCT: 0 % (ref 0–5)
HCT: 47.2 % — ABNORMAL HIGH (ref 36.0–46.0)
HEMOGLOBIN: 15.6 g/dL — AB (ref 12.0–15.0)
Lymphocytes Relative: 9 % — ABNORMAL LOW (ref 12–46)
Lymphs Abs: 0.9 10*3/uL (ref 0.7–4.0)
MCH: 31.8 pg (ref 26.0–34.0)
MCHC: 33.1 g/dL (ref 30.0–36.0)
MCV: 96.3 fL (ref 78.0–100.0)
MONOS PCT: 10 % (ref 3–12)
Monocytes Absolute: 1 10*3/uL (ref 0.1–1.0)
NEUTROS PCT: 80 % — AB (ref 43–77)
Neutro Abs: 8.6 10*3/uL — ABNORMAL HIGH (ref 1.7–7.7)
Platelets: 246 10*3/uL (ref 150–400)
RBC: 4.9 MIL/uL (ref 3.87–5.11)
RDW: 13 % (ref 11.5–15.5)
WBC: 10.5 10*3/uL (ref 4.0–10.5)

## 2015-07-04 LAB — URINALYSIS, ROUTINE W REFLEX MICROSCOPIC
BILIRUBIN URINE: NEGATIVE
Glucose, UA: NEGATIVE mg/dL
KETONES UR: 15 mg/dL — AB
LEUKOCYTES UA: NEGATIVE
NITRITE: NEGATIVE
PROTEIN: NEGATIVE mg/dL
Specific Gravity, Urine: >1.046 — ABNORMAL HIGH (ref 1.005–1.030)
Urobilinogen, UA: 1 mg/dL (ref 0.0–1.0)
pH: 5.5 (ref 5.0–8.0)

## 2015-07-04 LAB — I-STAT CHEM 8, ED
BUN: 14 mg/dL (ref 6–20)
Calcium, Ion: 1.1 mmol/L — ABNORMAL LOW (ref 1.13–1.30)
Chloride: 99 mmol/L — ABNORMAL LOW (ref 101–111)
Creatinine, Ser: 0.6 mg/dL (ref 0.44–1.00)
Glucose, Bld: 103 mg/dL — ABNORMAL HIGH (ref 65–99)
HCT: 51 % — ABNORMAL HIGH (ref 36.0–46.0)
HEMOGLOBIN: 17.3 g/dL — AB (ref 12.0–15.0)
POTASSIUM: 4.2 mmol/L (ref 3.5–5.1)
Sodium: 137 mmol/L (ref 135–145)
TCO2: 27 mmol/L (ref 0–100)

## 2015-07-04 LAB — URINE MICROSCOPIC-ADD ON

## 2015-07-04 MED ORDER — DONEPEZIL HCL 10 MG PO TABS
10.0000 mg | ORAL_TABLET | Freq: Every day | ORAL | Status: DC
  Administered 2015-07-05 – 2015-07-06 (×2): 10 mg via ORAL
  Filled 2015-07-04 (×2): qty 1

## 2015-07-04 MED ORDER — SENNA 8.6 MG PO TABS
1.0000 | ORAL_TABLET | Freq: Two times a day (BID) | ORAL | Status: DC
  Administered 2015-07-05 – 2015-07-06 (×4): 8.6 mg via ORAL
  Filled 2015-07-04 (×4): qty 1

## 2015-07-04 MED ORDER — SODIUM CHLORIDE 0.9 % IV SOLN
INTRAVENOUS | Status: DC
  Administered 2015-07-04: 23:00:00 via INTRAVENOUS

## 2015-07-04 MED ORDER — BISACODYL 10 MG RE SUPP: 10.0000 mg | Freq: Every day | RECTAL | Status: DC | PRN

## 2015-07-04 MED ORDER — BACITRACIN ZINC 500 UNIT/GM EX OINT
TOPICAL_OINTMENT | CUTANEOUS | Status: AC
  Administered 2015-07-04: 22:00:00
  Filled 2015-07-04: qty 1.8

## 2015-07-04 MED ORDER — MORPHINE SULFATE 4 MG/ML IJ SOLN
3.0000 mg | Freq: Once | INTRAMUSCULAR | Status: AC
  Administered 2015-07-04: 3 mg via INTRAVENOUS
  Filled 2015-07-04: qty 1

## 2015-07-04 MED ORDER — METOPROLOL TARTRATE 25 MG PO TABS
25.0000 mg | ORAL_TABLET | Freq: Two times a day (BID) | ORAL | Status: DC
  Administered 2015-07-05 – 2015-07-06 (×4): 25 mg via ORAL
  Filled 2015-07-04 (×5): qty 1

## 2015-07-04 MED ORDER — IOHEXOL 300 MG/ML  SOLN
100.0000 mL | Freq: Once | INTRAMUSCULAR | Status: AC | PRN
  Administered 2015-07-04: 80 mL via INTRAVENOUS

## 2015-07-04 MED ORDER — ACETAMINOPHEN 325 MG PO TABS
650.0000 mg | ORAL_TABLET | Freq: Four times a day (QID) | ORAL | Status: DC | PRN
  Administered 2015-07-05: 650 mg via ORAL
  Filled 2015-07-04: qty 2

## 2015-07-04 MED ORDER — ONDANSETRON HCL 4 MG/2ML IJ SOLN: 4.0000 mg | Freq: Three times a day (TID) | INTRAMUSCULAR | Status: DC | PRN

## 2015-07-04 MED ORDER — SODIUM CHLORIDE 0.9 % IJ SOLN
3.0000 mL | Freq: Two times a day (BID) | INTRAMUSCULAR | Status: DC
  Administered 2015-07-05 – 2015-07-06 (×2): 3 mL via INTRAVENOUS

## 2015-07-04 MED ORDER — DOCUSATE SODIUM 100 MG PO CAPS
100.0000 mg | ORAL_CAPSULE | Freq: Two times a day (BID) | ORAL | Status: DC
  Administered 2015-07-05 – 2015-07-06 (×4): 100 mg via ORAL
  Filled 2015-07-04 (×4): qty 1

## 2015-07-04 MED ORDER — HYDROCODONE-ACETAMINOPHEN 5-325 MG PO TABS
1.0000 | ORAL_TABLET | ORAL | Status: DC | PRN
  Administered 2015-07-05: 1 via ORAL
  Filled 2015-07-04: qty 1

## 2015-07-04 MED ORDER — ONDANSETRON HCL 4 MG/2ML IJ SOLN: 4.0000 mg | Freq: Four times a day (QID) | INTRAMUSCULAR | Status: DC | PRN

## 2015-07-04 MED ORDER — POLYETHYLENE GLYCOL 3350 17 G PO PACK: 17.0000 g | PACK | Freq: Every day | ORAL | Status: DC | PRN

## 2015-07-04 MED ORDER — FLEET ENEMA 7-19 GM/118ML RE ENEM
1.0000 | ENEMA | Freq: Once | RECTAL | Status: AC | PRN
  Filled 2015-07-04: qty 1

## 2015-07-04 MED ORDER — ACETAMINOPHEN 650 MG RE SUPP: 650.0000 mg | Freq: Four times a day (QID) | RECTAL | Status: DC | PRN

## 2015-07-04 MED ORDER — CITALOPRAM HYDROBROMIDE 20 MG PO TABS
10.0000 mg | ORAL_TABLET | Freq: Two times a day (BID) | ORAL | Status: DC
  Administered 2015-07-05 – 2015-07-06 (×4): 10 mg via ORAL
  Filled 2015-07-04 (×4): qty 1

## 2015-07-04 MED ORDER — MORPHINE SULFATE 2 MG/ML IJ SOLN: 2.0000 mg | INTRAMUSCULAR | Status: DC | PRN

## 2015-07-04 MED ORDER — ONDANSETRON HCL 4 MG PO TABS: 4.0000 mg | ORAL_TABLET | Freq: Four times a day (QID) | ORAL | Status: DC | PRN

## 2015-07-04 NOTE — Consult Note (Signed)
Reason for Consult:Multiple rib fractures after fall Referring Physician: Treana Lacour is an 81 y.o. female.  HPI: Patient fell while at home, she states that she was carrying clothes on the stairs and lost her balance or tripped.  Hit her chest wall on the step which were carpeted.  Did not hit her head.  No LOC according to the patient, but she is a bit forgetful.  Says she got up with the assistance of her husband, but thought the fall occurred a couple of days ago.  Past Medical History  Diagnosis Date  . Hypertension   . Osteoporosis   . PMR (polymyalgia rheumatica)   . Tobacco abuse   . Glucose intolerance (impaired glucose tolerance)   . Osteoarthritis   . Arthritis     "right shoulder; that's why I had to have it replaced"    Past Surgical History  Procedure Laterality Date  . Cataract extraction w/ intraocular lens  implant, bilateral      2008   . Posterior fusion lumbar spine  03/28/12  . Total shoulder replacement  06/2011    right  . Shoulder arthroscopy w/ rotator cuff repair  ~ 01/2011    right  . Vaginal hysterectomy  1970's  . Kyphoplasty N/A 02/28/2013    Procedure: KYPHOPLASTY;  Surgeon: Cristi Loron, MD;  Location: [facility];  Service: Neurosurgery;  Laterality: N/A;  Lumbar two Kyphoplasty  . Hip pinning,cannulated Right 10/20/2013    Procedure: CANNULATED HIP PINNING;  Surgeon: Eldred Manges, MD;  Location: [facility];  Service: Orthopedics;  Laterality: Right;    Family History  Problem Relation Age of Onset  . ALS Father   . Hypertension Sister     Social History:  reports that she has been smoking Cigarettes.  She has a 20 pack-year smoking history. She has never used smokeless tobacco. She reports that she drinks alcohol. She reports that she does not use illicit drugs.  Allergies: No Known Allergies  Medications:  I have reviewed the patient's current medications. Prior to Admission:  Prescriptions prior to admission  Medication  Sig Dispense Refill Last Dose  . citalopram (CELEXA) 10 MG tablet Take 2 tablets daily (Patient taking differently: Take 10 mg by mouth 2 (two) times daily. ) 180 tablet 3   . donepezil (ARICEPT) 10 MG tablet Take 1 tablet (10 mg total) by mouth at bedtime. 90 tablet 3 07/03/2015 at Unknown time  . metoprolol (LOPRESSOR) 50 MG tablet 1 by mouth each bedtime (Patient taking differently: Take 50 mg by mouth every morning. ) 100 tablet 3  at Unknown time    Results for orders placed or performed during the hospital encounter of 07/04/15 (from the past 48 hour(s))  CBC with Differential     Status: Abnormal   Collection Time: 07/04/15  6:07 PM  Result Value Ref Range   WBC 10.5 4.0 - 10.5 K/uL   RBC 4.90 3.87 - 5.11 MIL/uL   Hemoglobin 15.6 (H) 12.0 - 15.0 g/dL   HCT 16.1 (H) 09.6 - 04.5 %   MCV 96.3 78.0 - 100.0 fL   MCH 31.8 26.0 - 34.0 pg   MCHC 33.1 30.0 - 36.0 g/dL   RDW 40.9 81.1 - 91.4 %   Platelets 246 150 - 400 K/uL   Neutrophils Relative % 80 (H) 43 - 77 %   Neutro Abs 8.6 (H) 1.7 - 7.7 K/uL   Lymphocytes Relative 9 (L) 12 - 46 %  Lymphs Abs 0.9 0.7 - 4.0 K/uL   Monocytes Relative 10 3 - 12 %   Monocytes Absolute 1.0 0.1 - 1.0 K/uL   Eosinophils Relative 0 0 - 5 %   Eosinophils Absolute 0.0 0.0 - 0.7 K/uL   Basophils Relative 1 0 - 1 %   Basophils Absolute 0.1 0.0 - 0.1 K/uL  I-stat Chem 8, ED     Status: Abnormal   Collection Time: 07/04/15  6:19 PM  Result Value Ref Range   Sodium 137 135 - 145 mmol/L   Potassium 4.2 3.5 - 5.1 mmol/L   Chloride 99 (L) 101 - 111 mmol/L   BUN 14 6 - 20 mg/dL   Creatinine, Ser 1.61 0.44 - 1.00 mg/dL   Glucose, Bld 096 (H) 65 - 99 mg/dL   Calcium, Ion 0.45 (L) 1.13 - 1.30 mmol/L   TCO2 27 0 - 100 mmol/L   Hemoglobin 17.3 (H) 12.0 - 15.0 g/dL   HCT 40.9 (H) 81.1 - 91.4 %  Urinalysis, Routine w reflex microscopic (not at [facility])     Status: Abnormal   Collection Time: 07/04/15  7:25 PM  Result Value Ref Range   Color, Urine AMBER (A)  YELLOW    Comment: BIOCHEMICALS MAY BE AFFECTED BY COLOR   APPearance CLEAR CLEAR   Specific Gravity, Urine >1.046 (H) 1.005 - 1.030   pH 5.5 5.0 - 8.0   Glucose, UA NEGATIVE NEGATIVE mg/dL   Hgb urine dipstick TRACE (A) NEGATIVE   Bilirubin Urine NEGATIVE NEGATIVE   Ketones, ur 15 (A) NEGATIVE mg/dL   Protein, ur NEGATIVE NEGATIVE mg/dL   Urobilinogen, UA 1.0 0.0 - 1.0 mg/dL   Nitrite NEGATIVE NEGATIVE   Leukocytes, UA NEGATIVE NEGATIVE  Urine microscopic-add on     Status: None   Collection Time: 07/04/15  7:25 PM  Result Value Ref Range   Squamous Epithelial / LPF RARE RARE   RBC / HPF 0-2 <3 RBC/hpf    Dg Thoracic Spine W/swimmers  07/04/2015   CLINICAL DATA:  Fall in bathroom yesterday.  Landed on left side.  EXAM: THORACIC SPINE - 3 VIEWS  COMPARISON:  CT 07/04/2015  FINDINGS: Mild compression fracture at L2 status post vertebroplasty. Severe compression fracture with kyphosis at the T12. No additional fracture.  IMPRESSION: Severe compression fracture at T12 as seen on prior CT. This is age indeterminate, but new since 2014. Favor chronic.   Electronically Signed   By: Charlett Nose M.D.   On: 07/04/2015 20:03   Dg Lumbar Spine Complete  07/04/2015   CLINICAL DATA:  Fall in bathroom yesterday.  Low back pain.  EXAM: LUMBAR SPINE - COMPLETE 4+ VIEW  COMPARISON:  07/19/2013  FINDINGS: Previous lumbar spine fusion at L4-5 appears stable. Old L2 vertebral body compression fracture with vertebroplasty appears stable. Old T12 vertebral body compression fracture is also unchanged.  No evidence of acute lumbar spine fracture or subluxation. Bilateral facet DJD is seen at L5-S1 with associated grade 1 anterolisthesis measuring 8 mm which is unchanged. Generalized osteopenia noted.  IMPRESSION: No acute findings.  Old T12 and L2 vertebral body compression fractures.  Stable bilateral L5-S1 facet DJD with grade 1 degenerative anterolisthesis. Stable appearance of fusion at L4-5.  Osteopenia.    Electronically Signed   By: Myles Rosenthal M.D.   On: 07/04/2015 20:06   Ct Head Wo Contrast  07/04/2015   CLINICAL DATA:  Fall, dementia, chest pain.  EXAM: CT HEAD WITHOUT CONTRAST  CT CERVICAL SPINE  WITHOUT CONTRAST  TECHNIQUE: Multidetector CT imaging of the head and cervical spine was performed following the standard protocol without intravenous contrast. Multiplanar CT image reconstructions of the cervical spine were also generated.  COMPARISON:  09/25/2014  FINDINGS: CT HEAD FINDINGS  No acute intracranial abnormality. Specifically, no hemorrhage, hydrocephalus, mass lesion, acute infarction, or significant intracranial injury. No acute calvarial abnormality. Mild mucosal thickening in the paranasal sinuses. Mastoid air cells are clear.  CT CERVICAL SPINE FINDINGS  Diffuse bilateral degenerative facet disease. Mild degenerative disc disease at C5-6 and C6-7. Normal alignment. Prevertebral soft tissues are normal.  There is slight compression fracture through the superior endplates at T2. This is age indeterminate, but new since in 09/25/2014. No additional fracture. No epidural or paraspinal hematoma.  Small left apical pneumothorax noted as seen on chest CT.  IMPRESSION: No acute intracranial abnormality.  Slight compression fracture through the superior endplate at T2, age indeterminate but new since 09/25/2014.  Small left apical pneumothorax. See chest CT report for further discussion.   Electronically Signed   By: Charlett Nose M.D.   On: 07/04/2015 19:11   Ct Chest W Contrast  07/04/2015   CLINICAL DATA:  Fall, chest pain.  EXAM: CT CHEST, ABDOMEN, AND PELVIS WITH CONTRAST  TECHNIQUE: Multidetector CT imaging of the chest, abdomen and pelvis was performed following the standard protocol during bolus administration of intravenous contrast.  CONTRAST:  80mL OMNIPAQUE IOHEXOL 300 MG/ML  SOLN  COMPARISON:  None.  FINDINGS: CT CHEST FINDINGS  There is a small left pleural effusion. Left lower lobe  atelectasis. Multiple left rib fractures involving the sixth through tenth ribs posteriorly. Anterior rib fractures involving the left fifth, sixth and seventh ribs as well. Small amount subcutaneous air. Small left apical pneumothorax.  Linear atelectasis or scarring in the right lung base. Right lung otherwise clear. Heart is normal size. Aorta is normal caliber with scattered calcifications. No mediastinal, hilar, or axillary adenopathy. Chest wall soft tissues are unremarkable.  CT ABDOMEN AND PELVIS FINDINGS  Liver, spleen, pancreas, adrenals and kidneys are unremarkable. Stomach, large and small bowel grossly unremarkable. Prior hysterectomy. Urinary bladder is unremarkable. No adnexal masses. No free fluid, free air or adenopathy. Aorta is calcified, non aneurysmal.  Bold healed left superior and inferior pubic rami fractures. Evidence of old right femoral neck fracture with screws in place. No acute bony abnormality within the pelvis. Severe compression fracture at T12, new since 2014 but this has a chronic/old appearance. Old L2 compression fracture with prior vertebroplasty. Posterior fusion changes at L4-5.  IMPRESSION: Multiple left rib fractures as described above. Small left pleural effusion with left lower lobe atelectasis. Small left apical pneumothorax.  No acute findings in the abdomen or pelvis.  Multiple lumbar compression fractures as described above have a chronic appearance.   Electronically Signed   By: Charlett Nose M.D.   On: 07/04/2015 19:02   Ct Cervical Spine Wo Contrast  07/04/2015   CLINICAL DATA:  Fall, dementia, chest pain.  EXAM: CT HEAD WITHOUT CONTRAST  CT CERVICAL SPINE WITHOUT CONTRAST  TECHNIQUE: Multidetector CT imaging of the head and cervical spine was performed following the standard protocol without intravenous contrast. Multiplanar CT image reconstructions of the cervical spine were also generated.  COMPARISON:  09/25/2014  FINDINGS: CT HEAD FINDINGS  No acute  intracranial abnormality. Specifically, no hemorrhage, hydrocephalus, mass lesion, acute infarction, or significant intracranial injury. No acute calvarial abnormality. Mild mucosal thickening in the paranasal sinuses. Mastoid air cells are clear.  CT CERVICAL SPINE FINDINGS  Diffuse bilateral degenerative facet disease. Mild degenerative disc disease at C5-6 and C6-7. Normal alignment. Prevertebral soft tissues are normal.  There is slight compression fracture through the superior endplates at T2. This is age indeterminate, but new since in 09/25/2014. No additional fracture. No epidural or paraspinal hematoma.  Small left apical pneumothorax noted as seen on chest CT.  IMPRESSION: No acute intracranial abnormality.  Slight compression fracture through the superior endplate at T2, age indeterminate but new since 09/25/2014.  Small left apical pneumothorax. See chest CT report for further discussion.   Electronically Signed   By: Charlett Nose M.D.   On: 07/04/2015 19:11   Ct Abdomen Pelvis W Contrast  07/04/2015   CLINICAL DATA:  Fall, chest pain.  EXAM: CT CHEST, ABDOMEN, AND PELVIS WITH CONTRAST  TECHNIQUE: Multidetector CT imaging of the chest, abdomen and pelvis was performed following the standard protocol during bolus administration of intravenous contrast.  CONTRAST:  80mL OMNIPAQUE IOHEXOL 300 MG/ML  SOLN  COMPARISON:  None.  FINDINGS: CT CHEST FINDINGS  There is a small left pleural effusion. Left lower lobe atelectasis. Multiple left rib fractures involving the sixth through tenth ribs posteriorly. Anterior rib fractures involving the left fifth, sixth and seventh ribs as well. Small amount subcutaneous air. Small left apical pneumothorax.  Linear atelectasis or scarring in the right lung base. Right lung otherwise clear. Heart is normal size. Aorta is normal caliber with scattered calcifications. No mediastinal, hilar, or axillary adenopathy. Chest wall soft tissues are unremarkable.  CT ABDOMEN AND  PELVIS FINDINGS  Liver, spleen, pancreas, adrenals and kidneys are unremarkable. Stomach, large and small bowel grossly unremarkable. Prior hysterectomy. Urinary bladder is unremarkable. No adnexal masses. No free fluid, free air or adenopathy. Aorta is calcified, non aneurysmal.  Bold healed left superior and inferior pubic rami fractures. Evidence of old right femoral neck fracture with screws in place. No acute bony abnormality within the pelvis. Severe compression fracture at T12, new since 2014 but this has a chronic/old appearance. Old L2 compression fracture with prior vertebroplasty. Posterior fusion changes at L4-5.  IMPRESSION: Multiple left rib fractures as described above. Small left pleural effusion with left lower lobe atelectasis. Small left apical pneumothorax.  No acute findings in the abdomen or pelvis.  Multiple lumbar compression fractures as described above have a chronic appearance.   Electronically Signed   By: Charlett Nose M.D.   On: 07/04/2015 19:02    Review of Systems  Cardiovascular: Positive for chest pain.       From rib fractures   Blood pressure 182/93, pulse 80, temperature 98.2 F (36.8 C), temperature source Oral, resp. rate 17, SpO2 97 %. Physical Exam  Vitals reviewed. Constitutional: She appears well-developed and well-nourished.  Small framed and frail  HENT:  Head: Normocephalic and atraumatic.  Eyes: Conjunctivae and EOM are normal. Pupils are equal, round, and reactive to light.  Neck: Normal range of motion. Neck supple.  No cervical point tenderness  Cardiovascular: Normal rate, regular rhythm and normal heart sounds.   No murmur heard. Hypertensive at 182/94  Respiratory: Effort normal and breath sounds normal. No tachypnea. She exhibits tenderness, bony tenderness and crepitus. She exhibits no mass and no deformity.    GI: Soft. Bowel sounds are normal. There is no tenderness.  Musculoskeletal: Normal range of motion.  Neurological: She is  alert.  Oriented to person and place (hospital)  Skin: Skin is warm and dry. She is not diaphoretic.  Psychiatric: She has a normal mood and affect. Her behavior is normal. Judgment and thought content normal.    Assessment/Plan: Ground level fall in a frail, forgetful 81 year old female with the following injuries: --left posterior rib fractures 6 through 10 with some displacement; --left anterior rib fractures 5 through 7 with minimal displacement; --Small left apical pneumothorax on CT--no CXR was performed;  Pain control is the key and I will prescribe tramadol 50mg  q6h CXR in the morning to assess possible enlargement of the left PTX; Physical therapy for ambulation and assessment of safety.  Shirley [name] 07/04/2015, 11:52 PM

## 2015-07-04 NOTE — ED Notes (Signed)
Patient transported to CT 

## 2015-07-04 NOTE — H&P (Signed)
PCP:  Evette Georges, MD    Referring provider Dr. Clarene Duke   Chief Complaint: fall  HPI: Shirley [name] is a 81 y.o. female   has a past medical history of Hypertension; Osteoporosis; PMR (polymyalgia rheumatica); Tobacco abuse; Glucose intolerance (impaired glucose tolerance); Osteoarthritis; and Arthritis.   Presented with fall yesterday evening in her bathroom she is unsure of details but thinksshe tripped on a stairs going to the bathroom at night.  Patient hit her left side. Patient presented with abrasion and skin tear to her left arm as well as pain in her left chest. Patient has history of dementia which is mild but she have had recurrent falls. In emergency department patient was found to have multiple left rib fractures and small left pleural effusion small left apical pneumothorax. Multiple compression fractures which were chronic. CT scan of the head show no evidence of intracranial bleeding.   ER MD discussed patient with Dr.Wyatt who recommends medicine to admit with transit Shirley [name], with trauma team consultation.   Per family she has been falling a lot.  She has mild dementia still able to pay some bills, cooks a bit. Her husband has significant health problems.   Hospitalist was called for admission for fall resulting in mild pneumothorax  Review of Systems:    Pertinent positives include:  Falls, chest pain (rib Pain),  Constitutional:  No weight loss, night sweats, Fevers, chills, fatigue, weight loss  HEENT:  No headaches, Difficulty swallowing,Tooth/dental problems,Sore throat,  No sneezing, itching, ear ache, nasal congestion, post nasal drip,  Cardio-vascular:  No  Orthopnea, PND, anasarca, dizziness, palpitations.no Bilateral lower extremity swelling  GI:  No heartburn, indigestion, abdominal pain, nausea, vomiting, diarrhea, change in bowel habits, loss of appetite, melena, blood in stool, hematemesis Resp:  no shortness of breath at rest. No dyspnea on  exertion, No excess mucus, no productive cough, No non-productive cough, No coughing up of blood.No change in color of mucus.No wheezing. Skin:  no rash or lesions. No jaundice GU:  no dysuria, change in color of urine, no urgency or frequency. No straining to urinate.  No flank pain.  Musculoskeletal:  No joint pain or no joint swelling. No decreased range of motion. No back pain.  Psych:  No change in mood or affect. No depression or anxiety. No memory loss.  Neuro: no localizing neurological complaints, no tingling, no weakness, no double vision, no gait abnormality, no slurred speech, no confusion  Otherwise ROS are negative except for above, 10 systems were reviewed  Past Medical History: Past Medical History  Diagnosis Date  . Hypertension   . Osteoporosis   . PMR (polymyalgia rheumatica)   . Tobacco abuse   . Glucose intolerance (impaired glucose tolerance)   . Osteoarthritis   . Arthritis     "right shoulder; that's why I had to have it replaced"   Past Surgical History  Procedure Laterality Date  . Cataract extraction w/ intraocular lens  implant, bilateral      2008   . Posterior fusion lumbar spine  03/28/12  . Total shoulder replacement  06/2011    right  . Shoulder arthroscopy w/ rotator cuff repair  ~ 01/2011    right  . Vaginal hysterectomy  1970's  . Kyphoplasty N/A 02/28/2013    Procedure: KYPHOPLASTY;  Surgeon: Cristi Loron, MD;  Location: [facility];  Service: Neurosurgery;  Laterality: N/A;  Lumbar two Kyphoplasty  . Hip pinning,cannulated Right 10/20/2013    Procedure: CANNULATED HIP  PINNING;  Surgeon: Eldred Manges, MD;  Location: [facility];  Service: Orthopedics;  Laterality: Right;     Medications: Prior to Admission medications   Medication Sig Start Date End Date Taking? Authorizing Provider  citalopram (CELEXA) 10 MG tablet Take 2 tablets daily Patient taking differently: Take 10 mg by mouth 2 (two) times daily.  05/08/15  Yes Van Clines, MD    donepezil (ARICEPT) 10 MG tablet Take 1 tablet (10 mg total) by mouth at bedtime. 05/08/15  Yes Van Clines, MD  metoprolol (LOPRESSOR) 50 MG tablet 1 by mouth each bedtime Patient taking differently: Take 50 mg by mouth every morning.  10/01/14  Yes Roderick Pee, MD    Allergies:  No Known Allergies  Social History:  Ambulatory  independently she has a walker but does not use  Lives at home   With family     reports that she has been smoking Cigarettes.  She has a 20 pack-year smoking history. She has never used smokeless tobacco. She reports that she drinks alcohol. She reports that she does not use illicit drugs.    Family History: family history includes ALS in her father; Hypertension in her sister.    Physical Exam: Patient Vitals for the past 24 hrs:  BP Temp Temp src Pulse Resp SpO2  07/04/15 1917 164/79 mmHg 98.7 F (37.1 C) Oral 78 21 97 %  07/04/15 1628 165/91 mmHg 97.7 F (36.5 C) Oral 97 18 97 %    1. General:  in No Acute distress 2. Psychological: Alert and   Oriented 3. Head/ENT:    Dry Mucous Membranes                          Head Non traumatic, neck supple                          Normal  Dentition 4. SKIN:  decreased Skin turgor,  Skin clean Dry  no rash, skin abrasion with skin tear to left arm 5. Heart: Regular rate and rhythm no Murmur, Rub or gallop 6. Lungs: Clear to auscultation bilaterally, no wheezes or crackles   7. Abdomen: Soft, non-tender, Non distended 8. Lower extremities: no clubbing, cyanosis, or edema 9. Neurologically Grossly intact, moving all 4 extremities equally 10. MSK: Normal range of motion tenderness not left side of the chest wall   body mass index is unknown because there is no weight on file.   Labs on Admission:   Results for orders placed or performed during the hospital encounter of 07/04/15 (from the past 24 hour(s))  CBC with Differential     Status: Abnormal   Collection Time: 07/04/15  6:07 PM  Result  Value Ref Range   WBC 10.5 4.0 - 10.5 K/uL   RBC 4.90 3.87 - 5.11 MIL/uL   Hemoglobin 15.6 (H) 12.0 - 15.0 g/dL   HCT 69.6 (H) 29.5 - 28.4 %   MCV 96.3 78.0 - 100.0 fL   MCH 31.8 26.0 - 34.0 pg   MCHC 33.1 30.0 - 36.0 g/dL   RDW 13.2 44.0 - 10.2 %   Platelets 246 150 - 400 K/uL   Neutrophils Relative % 80 (H) 43 - 77 %   Neutro Abs 8.6 (H) 1.7 - 7.7 K/uL   Lymphocytes Relative 9 (L) 12 - 46 %   Lymphs Abs 0.9 0.7 - 4.0 K/uL   Monocytes Relative  10 3 - 12 %   Monocytes Absolute 1.0 0.1 - 1.0 K/uL   Eosinophils Relative 0 0 - 5 %   Eosinophils Absolute 0.0 0.0 - 0.7 K/uL   Basophils Relative 1 0 - 1 %   Basophils Absolute 0.1 0.0 - 0.1 K/uL  I-stat Chem 8, ED     Status: Abnormal   Collection Time: 07/04/15  6:19 PM  Result Value Ref Range   Sodium 137 135 - 145 mmol/L   Potassium 4.2 3.5 - 5.1 mmol/L   Chloride 99 (L) 101 - 111 mmol/L   BUN 14 6 - 20 mg/dL   Creatinine, Ser 1.61 0.44 - 1.00 mg/dL   Glucose, Bld 096 (H) 65 - 99 mg/dL   Calcium, Ion 0.45 (L) 1.13 - 1.30 mmol/L   TCO2 27 0 - 100 mmol/L   Hemoglobin 17.3 (H) 12.0 - 15.0 g/dL   HCT 40.9 (H) 81.1 - 91.4 %  Urinalysis, Routine w reflex microscopic (not at [facility])     Status: Abnormal   Collection Time: 07/04/15  7:25 PM  Result Value Ref Range   Color, Urine AMBER (A) YELLOW   APPearance CLEAR CLEAR   Specific Gravity, Urine >1.046 (H) 1.005 - 1.030   pH 5.5 5.0 - 8.0   Glucose, UA NEGATIVE NEGATIVE mg/dL   Hgb urine dipstick TRACE (A) NEGATIVE   Bilirubin Urine NEGATIVE NEGATIVE   Ketones, ur 15 (A) NEGATIVE mg/dL   Protein, ur NEGATIVE NEGATIVE mg/dL   Urobilinogen, UA 1.0 0.0 - 1.0 mg/dL   Nitrite NEGATIVE NEGATIVE   Leukocytes, UA NEGATIVE NEGATIVE  Urine microscopic-add on     Status: None   Collection Time: 07/04/15  7:25 PM  Result Value Ref Range   Squamous Epithelial / LPF RARE RARE   RBC / HPF 0-2 <3 RBC/hpf    UA concentrated specific gravity of 1.046 no evidence of UTI  No results found for:  HGBA1C  CrCl cannot be calculated (Unknown ideal weight.).  BNP (last 3 results) No results for input(s): PROBNP in the last 8760 hours.  Other results:  I have pearsonaly reviewed this: ECG REPORT not obtained There were no vitals filed for this visit.   Cultures:    Component Value Date/Time   SDES URINE, CLEAN CATCH 10/21/2013 0324   SPECREQUEST NONE 10/21/2013 0324   CULT NO GROWTH Performed at [facility] 10/21/2013 0324   REPTSTATUS 10/22/2013 FINAL 10/21/2013 0324     Radiological Exams on Admission: Dg Thoracic Spine W/swimmers  07/04/2015   CLINICAL DATA:  Fall in bathroom yesterday.  Landed on left side.  EXAM: THORACIC SPINE - 3 VIEWS  COMPARISON:  CT 07/04/2015  FINDINGS: Mild compression fracture at L2 status post vertebroplasty. Severe compression fracture with kyphosis at the T12. No additional fracture.  IMPRESSION: Severe compression fracture at T12 as seen on prior CT. This is age indeterminate, but new since 2014. Favor chronic.   Electronically Signed   By: Charlett Nose M.D.   On: 07/04/2015 20:03   Dg Lumbar Spine Complete  07/04/2015   CLINICAL DATA:  Fall in bathroom yesterday.  Low back pain.  EXAM: LUMBAR SPINE - COMPLETE 4+ VIEW  COMPARISON:  07/19/2013  FINDINGS: Previous lumbar spine fusion at L4-5 appears stable. Old L2 vertebral body compression fracture with vertebroplasty appears stable. Old T12 vertebral body compression fracture is also unchanged.  No evidence of acute lumbar spine fracture or subluxation. Bilateral facet DJD is seen at L5-S1 with associated  grade 1 anterolisthesis measuring 8 mm which is unchanged. Generalized osteopenia noted.  IMPRESSION: No acute findings.  Old T12 and L2 vertebral body compression fractures.  Stable bilateral L5-S1 facet DJD with grade 1 degenerative anterolisthesis. Stable appearance of fusion at L4-5.  Osteopenia.   Electronically Signed   By: Myles Rosenthal M.D.   On: 07/04/2015 20:06   Ct Head Wo  Contrast  07/04/2015   CLINICAL DATA:  Fall, dementia, chest pain.  EXAM: CT HEAD WITHOUT CONTRAST  CT CERVICAL SPINE WITHOUT CONTRAST  TECHNIQUE: Multidetector CT imaging of the head and cervical spine was performed following the standard protocol without intravenous contrast. Multiplanar CT image reconstructions of the cervical spine were also generated.  COMPARISON:  09/25/2014  FINDINGS: CT HEAD FINDINGS  No acute intracranial abnormality. Specifically, no hemorrhage, hydrocephalus, mass lesion, acute infarction, or significant intracranial injury. No acute calvarial abnormality. Mild mucosal thickening in the paranasal sinuses. Mastoid air cells are clear.  CT CERVICAL SPINE FINDINGS  Diffuse bilateral degenerative facet disease. Mild degenerative disc disease at C5-6 and C6-7. Normal alignment. Prevertebral soft tissues are normal.  There is slight compression fracture through the superior endplates at T2. This is age indeterminate, but new since in 09/25/2014. No additional fracture. No epidural or paraspinal hematoma.  Small left apical pneumothorax noted as seen on chest CT.  IMPRESSION: No acute intracranial abnormality.  Slight compression fracture through the superior endplate at T2, age indeterminate but new since 09/25/2014.  Small left apical pneumothorax. See chest CT report for further discussion.   Electronically Signed   By: Charlett Nose M.D.   On: 07/04/2015 19:11   Ct Chest W Contrast  07/04/2015   CLINICAL DATA:  Fall, chest pain.  EXAM: CT CHEST, ABDOMEN, AND PELVIS WITH CONTRAST  TECHNIQUE: Multidetector CT imaging of the chest, abdomen and pelvis was performed following the standard protocol during bolus administration of intravenous contrast.  CONTRAST:  80mL OMNIPAQUE IOHEXOL 300 MG/ML  SOLN  COMPARISON:  None.  FINDINGS: CT CHEST FINDINGS  There is a small left pleural effusion. Left lower lobe atelectasis. Multiple left rib fractures involving the sixth through tenth ribs  posteriorly. Anterior rib fractures involving the left fifth, sixth and seventh ribs as well. Small amount subcutaneous air. Small left apical pneumothorax.  Linear atelectasis or scarring in the right lung base. Right lung otherwise clear. Heart is normal size. Aorta is normal caliber with scattered calcifications. No mediastinal, hilar, or axillary adenopathy. Chest wall soft tissues are unremarkable.  CT ABDOMEN AND PELVIS FINDINGS  Liver, spleen, pancreas, adrenals and kidneys are unremarkable. Stomach, large and small bowel grossly unremarkable. Prior hysterectomy. Urinary bladder is unremarkable. No adnexal masses. No free fluid, free air or adenopathy. Aorta is calcified, non aneurysmal.  Bold healed left superior and inferior pubic rami fractures. Evidence of old right femoral neck fracture with screws in place. No acute bony abnormality within the pelvis. Severe compression fracture at T12, new since 2014 but this has a chronic/old appearance. Old L2 compression fracture with prior vertebroplasty. Posterior fusion changes at L4-5.  IMPRESSION: Multiple left rib fractures as described above. Small left pleural effusion with left lower lobe atelectasis. Small left apical pneumothorax.  No acute findings in the abdomen or pelvis.  Multiple lumbar compression fractures as described above have a chronic appearance.   Electronically Signed   By: Charlett Nose M.D.   On: 07/04/2015 19:02   Ct Cervical Spine Wo Contrast  07/04/2015   CLINICAL DATA:  Fall,  dementia, chest pain.  EXAM: CT HEAD WITHOUT CONTRAST  CT CERVICAL SPINE WITHOUT CONTRAST  TECHNIQUE: Multidetector CT imaging of the head and cervical spine was performed following the standard protocol without intravenous contrast. Multiplanar CT image reconstructions of the cervical spine were also generated.  COMPARISON:  09/25/2014  FINDINGS: CT HEAD FINDINGS  No acute intracranial abnormality. Specifically, no hemorrhage, hydrocephalus, mass lesion, acute  infarction, or significant intracranial injury. No acute calvarial abnormality. Mild mucosal thickening in the paranasal sinuses. Mastoid air cells are clear.  CT CERVICAL SPINE FINDINGS  Diffuse bilateral degenerative facet disease. Mild degenerative disc disease at C5-6 and C6-7. Normal alignment. Prevertebral soft tissues are normal.  There is slight compression fracture through the superior endplates at T2. This is age indeterminate, but new since in 09/25/2014. No additional fracture. No epidural or paraspinal hematoma.  Small left apical pneumothorax noted as seen on chest CT.  IMPRESSION: No acute intracranial abnormality.  Slight compression fracture through the superior endplate at T2, age indeterminate but new since 09/25/2014.  Small left apical pneumothorax. See chest CT report for further discussion.   Electronically Signed   By: Charlett Nose M.D.   On: 07/04/2015 19:11   Ct Abdomen Pelvis W Contrast  07/04/2015   CLINICAL DATA:  Fall, chest pain.  EXAM: CT CHEST, ABDOMEN, AND PELVIS WITH CONTRAST  TECHNIQUE: Multidetector CT imaging of the chest, abdomen and pelvis was performed following the standard protocol during bolus administration of intravenous contrast.  CONTRAST:  80mL OMNIPAQUE IOHEXOL 300 MG/ML  SOLN  COMPARISON:  None.  FINDINGS: CT CHEST FINDINGS  There is a small left pleural effusion. Left lower lobe atelectasis. Multiple left rib fractures involving the sixth through tenth ribs posteriorly. Anterior rib fractures involving the left fifth, sixth and seventh ribs as well. Small amount subcutaneous air. Small left apical pneumothorax.  Linear atelectasis or scarring in the right lung base. Right lung otherwise clear. Heart is normal size. Aorta is normal caliber with scattered calcifications. No mediastinal, hilar, or axillary adenopathy. Chest wall soft tissues are unremarkable.  CT ABDOMEN AND PELVIS FINDINGS  Liver, spleen, pancreas, adrenals and kidneys are unremarkable. Stomach,  large and small bowel grossly unremarkable. Prior hysterectomy. Urinary bladder is unremarkable. No adnexal masses. No free fluid, free air or adenopathy. Aorta is calcified, non aneurysmal.  Bold healed left superior and inferior pubic rami fractures. Evidence of old right femoral neck fracture with screws in place. No acute bony abnormality within the pelvis. Severe compression fracture at T12, new since 2014 but this has a chronic/old appearance. Old L2 compression fracture with prior vertebroplasty. Posterior fusion changes at L4-5.  IMPRESSION: Multiple left rib fractures as described above. Small left pleural effusion with left lower lobe atelectasis. Small left apical pneumothorax.  No acute findings in the abdomen or pelvis.  Multiple lumbar compression fractures as described above have a chronic appearance.   Electronically Signed   By: Charlett Nose M.D.   On: 07/04/2015 19:02    Chart has been reviewed  Family  at  Bedside  plan of care was discussed with  Shirley [name] cell [phone], Husband's  Home [phone]   Assessment/Plan  81 yo F with hx of mild dementia had a mechanical fall resulting in multiple left rib fractures with small pneumothorax no new oxygen requirement pain appears to be interfering with activity and deep breathing but able to tolerate at rest   Present on Admission:  . Essential hypertension - continue metoprolol  split the dose and in 25 twice a day instead of one time dose  . Polymyalgia rheumatica stable patient has not been on any recent CT relates  . Mild dementia mild but anticipate some sundowning may respond worsening confusion due to pain medications will monitor  . Rib fractures - case has been at length discussed with trauma surgery Dr. Lindie Spruce who will see patient on arrival to [facility]. At this point there is no indication for chest tube or any other surgical intervention. Patient has no new oxygen requirement will monitor on telemetry   patient  appears to be hemoconcentrated will give gentle fluids avoid overhydration  Prophylaxis: SCD   CODE STATUS:  FULL CODE as per patient    Disposition:  To home once workup is complete and patient is stable  Other plan as per orders.  I have spent a total of 65 min on this admission extra time was taken to discuss case with trauma surgery  Shirley [name] 07/04/2015, 8:23 PM  [facility]  Pager [phone]   after 2 AM please page floor coverage PA If 7AM-7PM, please contact the day team taking care of the patient  [email or web address]  Password [email or web address]

## 2015-07-04 NOTE — ED Provider Notes (Signed)
CSN: [id number]     Arrival date & time 07/04/15  1623 History   First MD Initiated Contact with Patient 07/04/15 1655     Chief Complaint  Patient presents with  . Fall     (Consider location/radiation/quality/duration/timing/severity/associated sxs/prior Treatment) HPI Comments: 81 year old female with past medical history including osteoporosis, polymyalgia rheumatica, dementia who presents with left-sided chest pain. History limited due to the patient's dementia and obtained with the assistance of the patient's son. Patient states that yesterday evening, she tripped and fell down one step. She did not lose consciousness. She states that she hit her left side and has had left sided chest wall pain that is severe, especially when she takes a deep breath. She also sustained abrasions to her left arm. She denies any headache, neck or back pain, abdominal pain, or extremity pain. Son states that earlier today she was having a lot of coughing and seemed to be unable to catch her breath which is unusual for her.  Patient is a 81 y.o. female presenting with fall. The history is provided by the patient and a relative. The history is limited by the condition of the patient.  Fall    Past Medical History  Diagnosis Date  . Hypertension   . Osteoporosis   . PMR (polymyalgia rheumatica)   . Tobacco abuse   . Glucose intolerance (impaired glucose tolerance)   . Osteoarthritis   . Arthritis     "right shoulder; that's why I had to have it replaced"   Past Surgical History  Procedure Laterality Date  . Cataract extraction w/ intraocular lens  implant, bilateral      2008   . Posterior fusion lumbar spine  03/28/12  . Total shoulder replacement  06/2011    right  . Shoulder arthroscopy w/ rotator cuff repair  ~ 01/2011    right  . Vaginal hysterectomy  1970's  . Kyphoplasty N/A 02/28/2013    Procedure: KYPHOPLASTY;  Surgeon: Cristi Loron, MD;  Location: [facility];  Service:  Neurosurgery;  Laterality: N/A;  Lumbar two Kyphoplasty  . Hip pinning,cannulated Right 10/20/2013    Procedure: CANNULATED HIP PINNING;  Surgeon: Eldred Manges, MD;  Location: [facility];  Service: Orthopedics;  Laterality: Right;   Family History  Problem Relation Age of Onset  . ALS Father   . Hypertension Sister    History  Substance Use Topics  . Smoking status: Current Every Day Smoker -- 0.50 packs/day for 40 years    Types: Cigarettes    Last Attempt to Quit: 12/06/1991  . Smokeless tobacco: Never Used  . Alcohol Use: Yes     Comment: occ   OB History    Gravida Para Term Preterm AB TAB SAB Ectopic Multiple Living   3              Review of Systems    Allergies  Review of patient's allergies indicates no known allergies.  Home Medications   Prior to Admission medications   Medication Sig Start Date End Date Taking? Authorizing Provider  citalopram (CELEXA) 10 MG tablet Take 2 tablets daily Patient taking differently: Take 10 mg by mouth 2 (two) times daily.  05/08/15  Yes Van Clines, MD  donepezil (ARICEPT) 10 MG tablet Take 1 tablet (10 mg total) by mouth at bedtime. 05/08/15  Yes Van Clines, MD  metoprolol (LOPRESSOR) 50 MG tablet 1 by mouth each bedtime Patient taking differently: Take 50 mg by mouth every morning.  10/01/14  Yes Roderick Pee, MD   BP 164/79 mmHg  Pulse 78  Temp(Src) 98.7 F (37.1 C) (Oral)  Resp 21  SpO2 97% Physical Exam  Constitutional: She is oriented to person, place, and time. She appears well-developed and well-nourished. No distress.  Thin, frail  HENT:  Head: Normocephalic and atraumatic.  Moist mucous membranes  Eyes: Conjunctivae are normal. Pupils are equal, round, and reactive to light.  Neck: Neck supple.  Cardiovascular: Normal rate, regular rhythm and normal heart sounds.   No murmur heard. Pulmonary/Chest: Effort normal and breath sounds normal.  L lateral chest wall tenderness  Abdominal: Soft. Bowel sounds are  normal. She exhibits no distension.  LUQ tenderness without rebound or guarding  Musculoskeletal: She exhibits no edema.  Normal ROM L shoulder/elbow/wrist/hand; normal strength throughout; 2+ radial pulses  Neurological: She is alert and oriented to person, place, and time.  Fluent speech  Skin:  Large skin tear on dorsal L forearm and scattered abrasions dorsum L hand  Psychiatric: She has a normal mood and affect. Judgment normal.  Nursing note and vitals reviewed.   ED Course  Procedures (including critical care time) Labs Review Labs Reviewed  CBC WITH DIFFERENTIAL/PLATELET - Abnormal; Notable for the following:    Hemoglobin 15.6 (*)    HCT 47.2 (*)    Neutrophils Relative % 80 (*)    Neutro Abs 8.6 (*)    Lymphocytes Relative 9 (*)    All other components within normal limits  I-STAT CHEM 8, ED - Abnormal; Notable for the following:    Chloride 99 (*)    Glucose, Bld 103 (*)    Calcium, Ion 1.10 (*)    Hemoglobin 17.3 (*)    HCT 51.0 (*)    All other components within normal limits  URINALYSIS, ROUTINE W REFLEX MICROSCOPIC (NOT AT [facility])    Imaging Review Ct Head Wo Contrast  07/04/2015   CLINICAL DATA:  Fall, dementia, chest pain.  EXAM: CT HEAD WITHOUT CONTRAST  CT CERVICAL SPINE WITHOUT CONTRAST  TECHNIQUE: Multidetector CT imaging of the head and cervical spine was performed following the standard protocol without intravenous contrast. Multiplanar CT image reconstructions of the cervical spine were also generated.  COMPARISON:  09/25/2014  FINDINGS: CT HEAD FINDINGS  No acute intracranial abnormality. Specifically, no hemorrhage, hydrocephalus, mass lesion, acute infarction, or significant intracranial injury. No acute calvarial abnormality. Mild mucosal thickening in the paranasal sinuses. Mastoid air cells are clear.  CT CERVICAL SPINE FINDINGS  Diffuse bilateral degenerative facet disease. Mild degenerative disc disease at C5-6 and C6-7. Normal alignment. Prevertebral  soft tissues are normal.  There is slight compression fracture through the superior endplates at T2. This is age indeterminate, but new since in 09/25/2014. No additional fracture. No epidural or paraspinal hematoma.  Small left apical pneumothorax noted as seen on chest CT.  IMPRESSION: No acute intracranial abnormality.  Slight compression fracture through the superior endplate at T2, age indeterminate but new since 09/25/2014.  Small left apical pneumothorax. See chest CT report for further discussion.   Electronically Signed   By: Charlett Nose M.D.   On: 07/04/2015 19:11   Ct Chest W Contrast  07/04/2015   CLINICAL DATA:  Fall, chest pain.  EXAM: CT CHEST, ABDOMEN, AND PELVIS WITH CONTRAST  TECHNIQUE: Multidetector CT imaging of the chest, abdomen and pelvis was performed following the standard protocol during bolus administration of intravenous contrast.  CONTRAST:  80mL OMNIPAQUE IOHEXOL 300 MG/ML  SOLN  COMPARISON:  None.  FINDINGS: CT CHEST FINDINGS  There is a small left pleural effusion. Left lower lobe atelectasis. Multiple left rib fractures involving the sixth through tenth ribs posteriorly. Anterior rib fractures involving the left fifth, sixth and seventh ribs as well. Small amount subcutaneous air. Small left apical pneumothorax.  Linear atelectasis or scarring in the right lung base. Right lung otherwise clear. Heart is normal size. Aorta is normal caliber with scattered calcifications. No mediastinal, hilar, or axillary adenopathy. Chest wall soft tissues are unremarkable.  CT ABDOMEN AND PELVIS FINDINGS  Liver, spleen, pancreas, adrenals and kidneys are unremarkable. Stomach, large and small bowel grossly unremarkable. Prior hysterectomy. Urinary bladder is unremarkable. No adnexal masses. No free fluid, free air or adenopathy. Aorta is calcified, non aneurysmal.  Bold healed left superior and inferior pubic rami fractures. Evidence of old right femoral neck fracture with screws in place. No  acute bony abnormality within the pelvis. Severe compression fracture at T12, new since 2014 but this has a chronic/old appearance. Old L2 compression fracture with prior vertebroplasty. Posterior fusion changes at L4-5.  IMPRESSION: Multiple left rib fractures as described above. Small left pleural effusion with left lower lobe atelectasis. Small left apical pneumothorax.  No acute findings in the abdomen or pelvis.  Multiple lumbar compression fractures as described above have a chronic appearance.   Electronically Signed   By: Charlett Nose M.D.   On: 07/04/2015 19:02   Ct Cervical Spine Wo Contrast  07/04/2015   CLINICAL DATA:  Fall, dementia, chest pain.  EXAM: CT HEAD WITHOUT CONTRAST  CT CERVICAL SPINE WITHOUT CONTRAST  TECHNIQUE: Multidetector CT imaging of the head and cervical spine was performed following the standard protocol without intravenous contrast. Multiplanar CT image reconstructions of the cervical spine were also generated.  COMPARISON:  09/25/2014  FINDINGS: CT HEAD FINDINGS  No acute intracranial abnormality. Specifically, no hemorrhage, hydrocephalus, mass lesion, acute infarction, or significant intracranial injury. No acute calvarial abnormality. Mild mucosal thickening in the paranasal sinuses. Mastoid air cells are clear.  CT CERVICAL SPINE FINDINGS  Diffuse bilateral degenerative facet disease. Mild degenerative disc disease at C5-6 and C6-7. Normal alignment. Prevertebral soft tissues are normal.  There is slight compression fracture through the superior endplates at T2. This is age indeterminate, but new since in 09/25/2014. No additional fracture. No epidural or paraspinal hematoma.  Small left apical pneumothorax noted as seen on chest CT.  IMPRESSION: No acute intracranial abnormality.  Slight compression fracture through the superior endplate at T2, age indeterminate but new since 09/25/2014.  Small left apical pneumothorax. See chest CT report for further discussion.    Electronically Signed   By: Charlett Nose M.D.   On: 07/04/2015 19:11   Ct Abdomen Pelvis W Contrast  07/04/2015   CLINICAL DATA:  Fall, chest pain.  EXAM: CT CHEST, ABDOMEN, AND PELVIS WITH CONTRAST  TECHNIQUE: Multidetector CT imaging of the chest, abdomen and pelvis was performed following the standard protocol during bolus administration of intravenous contrast.  CONTRAST:  80mL OMNIPAQUE IOHEXOL 300 MG/ML  SOLN  COMPARISON:  None.  FINDINGS: CT CHEST FINDINGS  There is a small left pleural effusion. Left lower lobe atelectasis. Multiple left rib fractures involving the sixth through tenth ribs posteriorly. Anterior rib fractures involving the left fifth, sixth and seventh ribs as well. Small amount subcutaneous air. Small left apical pneumothorax.  Linear atelectasis or scarring in the right lung base. Right lung otherwise clear. Heart is normal size. Aorta is normal caliber with scattered  calcifications. No mediastinal, hilar, or axillary adenopathy. Chest wall soft tissues are unremarkable.  CT ABDOMEN AND PELVIS FINDINGS  Liver, spleen, pancreas, adrenals and kidneys are unremarkable. Stomach, large and small bowel grossly unremarkable. Prior hysterectomy. Urinary bladder is unremarkable. No adnexal masses. No free fluid, free air or adenopathy. Aorta is calcified, non aneurysmal.  Bold healed left superior and inferior pubic rami fractures. Evidence of old right femoral neck fracture with screws in place. No acute bony abnormality within the pelvis. Severe compression fracture at T12, new since 2014 but this has a chronic/old appearance. Old L2 compression fracture with prior vertebroplasty. Posterior fusion changes at L4-5.  IMPRESSION: Multiple left rib fractures as described above. Small left pleural effusion with left lower lobe atelectasis. Small left apical pneumothorax.  No acute findings in the abdomen or pelvis.  Multiple lumbar compression fractures as described above have a chronic appearance.    Electronically Signed   By: Charlett Nose M.D.   On: 07/04/2015 19:02     EKG Interpretation None      MDM   Final diagnoses:  None   multiple left rib fractures Left pleural effusion Left pneumothorax Skin tears Lumbar compression fractures, age indeterminate  81 year old female who presents with left chest wall pain after falling from standing at home yesterday. Vital signs reassuring. Patient with significant pain during inspiration. Because of left upper quadrant and chest wall pain, as well as patient's underlying dementia and osteoporosis, obtained a full CT trauma gram to rule out injury. Gave the patient morphine for pain.  Imaging showed multiple injuries including multiple left-sided rib fractures, left pleural effusion, small apical pneumothorax, and an age-indeterminate lumbar compression fracture. The cause of the patient's advanced age and multiple injuries, she will be admitted for further care. I have spoken with Dr. Lindie Spruce, trauma surgeon on call at [facility], who has recommended transfer to [facility] for medicine admission w/ trauma surgery consult. Patient will be transferred to [facility] for further care.   Laurence Spates, MD 07/04/15 [phone]

## 2015-07-04 NOTE — ED Notes (Signed)
She states she fell in her bathroom yesterday evening, landing on her left side.  She has long, extensive skin tear on post. Left forearm; and c/o pain at left lower ribs area.  She is in no distress.

## 2015-07-05 ENCOUNTER — Observation Stay (HOSPITAL_COMMUNITY): Payer: Medicare Other

## 2015-07-05 DIAGNOSIS — F0390 Unspecified dementia without behavioral disturbance: Secondary | ICD-10-CM

## 2015-07-05 DIAGNOSIS — S2249XA Multiple fractures of ribs, unspecified side, initial encounter for closed fracture: Secondary | ICD-10-CM

## 2015-07-05 DIAGNOSIS — S2242XA Multiple fractures of ribs, left side, initial encounter for closed fracture: Secondary | ICD-10-CM | POA: Diagnosis not present

## 2015-07-05 LAB — COMPREHENSIVE METABOLIC PANEL
ALBUMIN: 3.4 g/dL — AB (ref 3.5–5.0)
ALT: 17 U/L (ref 14–54)
ANION GAP: 10 (ref 5–15)
AST: 29 U/L (ref 15–41)
Alkaline Phosphatase: 79 U/L (ref 38–126)
BILIRUBIN TOTAL: 1.4 mg/dL — AB (ref 0.3–1.2)
BUN: 10 mg/dL (ref 6–20)
CALCIUM: 9.4 mg/dL (ref 8.9–10.3)
CO2: 30 mmol/L (ref 22–32)
CREATININE: 0.62 mg/dL (ref 0.44–1.00)
Chloride: 95 mmol/L — ABNORMAL LOW (ref 101–111)
GFR calc Af Amer: >60 mL/min (ref >60)
GFR calc non Af Amer: >60 mL/min (ref >60)
Glucose, Bld: 97 mg/dL (ref 65–99)
Potassium: 4.2 mmol/L (ref 3.5–5.1)
Sodium: 135 mmol/L (ref 135–145)
TOTAL PROTEIN: 7.4 g/dL (ref 6.5–8.1)

## 2015-07-05 LAB — CBC
HCT: 48 % — ABNORMAL HIGH (ref 36.0–46.0)
Hemoglobin: 15.9 g/dL — ABNORMAL HIGH (ref 12.0–15.0)
MCH: 32.3 pg (ref 26.0–34.0)
MCHC: 33.1 g/dL (ref 30.0–36.0)
MCV: 97.4 fL (ref 78.0–100.0)
Platelets: 270 10*3/uL (ref 150–400)
RBC: 4.93 MIL/uL (ref 3.87–5.11)
RDW: 13.1 % (ref 11.5–15.5)
WBC: 11.1 10*3/uL — ABNORMAL HIGH (ref 4.0–10.5)

## 2015-07-05 LAB — TSH: TSH: 3.637 u[IU]/mL (ref 0.350–4.500)

## 2015-07-05 LAB — MAGNESIUM: MAGNESIUM: 2 mg/dL (ref 1.7–2.4)

## 2015-07-05 LAB — PHOSPHORUS: Phosphorus: 3.3 mg/dL (ref 2.5–4.6)

## 2015-07-05 MED ORDER — TRAMADOL HCL 50 MG PO TABS
50.0000 mg | ORAL_TABLET | Freq: Four times a day (QID) | ORAL | Status: DC
  Administered 2015-07-05 – 2015-07-06 (×5): 50 mg via ORAL
  Filled 2015-07-05 (×5): qty 1

## 2015-07-05 MED ORDER — MORPHINE SULFATE 2 MG/ML IJ SOLN: 1.0000 mg | INTRAMUSCULAR | Status: DC | PRN

## 2015-07-05 NOTE — Progress Notes (Signed)
After 4x's removing equipment IV changed to NSL; tele suspended; cont. Pulse ox and SCD's removed. Pt is stable but unco-operative. No sitters available ,request active.

## 2015-07-05 NOTE — Progress Notes (Signed)
[facility] PROGRESS NOTE  Shirley [name] LKG:[id number] DOB: [birth year] DOA: 07/04/2015 PCP: Evette Georges, MD  Assessment/Plan: 1. Fall/multiple left rib fractures with small pneumothorax -CXR this am unchanged, pain control -Incentive spirometry, OOB to chair, PT eval -CXR in am  2. Essential hypertension - continue metoprolol   3. Mild dementia -stable, caution with narcotics  4. PMR -not on meds, stable, monitor  Prophylaxis: SCD   Code Status: Full Code Family Communication: none at bedside Disposition Plan: home tomorrow if stable   Consultants:  [facility]  HPI/Subjective: Feels ok, intermittent rib pain  Objective: Filed Vitals:   07/05/15 0939  BP: 138/91  Pulse: 73  Temp: 98 F (36.7 C)  Resp: 18    Intake/Output Summary (Last 24 hours) at 07/05/15 0953 Last data filed at 07/05/15 0900  Gross per 24 hour  Intake    725 ml  Output      0 ml  Net    725 ml   Filed Weights   07/04/15 2215  Weight: 44.498 kg (98 lb 1.6 oz)    Exam:   General:  AAOx2  Cardiovascular: S1S2/RRR  Respiratory: poor air movemnt B/L  Abdomen: soft, Nt, BS present  Musculoskeletal: no edema c/c  Data Reviewed: Basic Metabolic Panel:  Recent Labs Lab 07/04/15 1819 07/05/15 0403  NA 137 135  K 4.2 4.2  CL 99* 95*  CO2  --  30  GLUCOSE 103* 97  BUN 14 10  CREATININE 0.60 0.62  CALCIUM  --  9.4  MG  --  2.0  PHOS  --  3.3   Liver Function Tests:  Recent Labs Lab 07/05/15 0403  AST 29  ALT 17  ALKPHOS 79  BILITOT 1.4*  PROT 7.4  ALBUMIN 3.4*   No results for input(s): LIPASE, AMYLASE in the last 168 hours. No results for input(s): AMMONIA in the last 168 hours. CBC:  Recent Labs Lab 07/04/15 1807 07/04/15 1819 07/05/15 0403  WBC 10.5  --  11.1*  NEUTROABS 8.6*  --   --   HGB 15.6* 17.3* 15.9*  HCT 47.2* 51.0* 48.0*  MCV 96.3  --  97.4  PLT 246  --  270   Cardiac Enzymes: No results for input(s): CKTOTAL, CKMB, CKMBINDEX,  TROPONINI in the last 168 hours. BNP (last 3 results) No results for input(s): BNP in the last 8760 hours.  ProBNP (last 3 results) No results for input(s): PROBNP in the last 8760 hours.  CBG: No results for input(s): GLUCAP in the last 168 hours.  No results found for this or any previous visit (from the past 240 hour(s)).   Studies: Dg Chest 2 View  07/05/2015   CLINICAL DATA:  Fall down steps. Landed on chest. LEFT pneumothorax and rib fractures.  EXAM: CHEST  2 VIEW  COMPARISON:  CT thorax 07/04/2015 is  FINDINGS: LEFT apical pneumothorax not increased in volume compared to prior CT. Posterior LEFT rib fractures are also again noted. There is small LEFT effusion and LEFT basilar atelectasis. Lower thoracic spine compression fracture again noted.  IMPRESSION: 1. No change in small LEFT apical pneumothorax. 2. LEFT posterior rib fractures with LEFT basilar atelectasis and small effusion.   Electronically Signed   By: Genevive Bi M.D.   On: 07/05/2015 09:47   Dg Thoracic Spine W/swimmers  07/04/2015   CLINICAL DATA:  Fall in bathroom yesterday.  Landed on left side.  EXAM: THORACIC SPINE - 3 VIEWS  COMPARISON:  CT 07/04/2015  FINDINGS:  Mild compression fracture at L2 status post vertebroplasty. Severe compression fracture with kyphosis at the T12. No additional fracture.  IMPRESSION: Severe compression fracture at T12 as seen on prior CT. This is age indeterminate, but new since 2014. Favor chronic.   Electronically Signed   By: Charlett Nose M.D.   On: 07/04/2015 20:03   Dg Lumbar Spine Complete  07/04/2015   CLINICAL DATA:  Fall in bathroom yesterday.  Low back pain.  EXAM: LUMBAR SPINE - COMPLETE 4+ VIEW  COMPARISON:  07/19/2013  FINDINGS: Previous lumbar spine fusion at L4-5 appears stable. Old L2 vertebral body compression fracture with vertebroplasty appears stable. Old T12 vertebral body compression fracture is also unchanged.  No evidence of acute lumbar spine fracture or  subluxation. Bilateral facet DJD is seen at L5-S1 with associated grade 1 anterolisthesis measuring 8 mm which is unchanged. Generalized osteopenia noted.  IMPRESSION: No acute findings.  Old T12 and L2 vertebral body compression fractures.  Stable bilateral L5-S1 facet DJD with grade 1 degenerative anterolisthesis. Stable appearance of fusion at L4-5.  Osteopenia.   Electronically Signed   By: Myles Rosenthal M.D.   On: 07/04/2015 20:06   Ct Head Wo Contrast  07/04/2015   CLINICAL DATA:  Fall, dementia, chest pain.  EXAM: CT HEAD WITHOUT CONTRAST  CT CERVICAL SPINE WITHOUT CONTRAST  TECHNIQUE: Multidetector CT imaging of the head and cervical spine was performed following the standard protocol without intravenous contrast. Multiplanar CT image reconstructions of the cervical spine were also generated.  COMPARISON:  09/25/2014  FINDINGS: CT HEAD FINDINGS  No acute intracranial abnormality. Specifically, no hemorrhage, hydrocephalus, mass lesion, acute infarction, or significant intracranial injury. No acute calvarial abnormality. Mild mucosal thickening in the paranasal sinuses. Mastoid air cells are clear.  CT CERVICAL SPINE FINDINGS  Diffuse bilateral degenerative facet disease. Mild degenerative disc disease at C5-6 and C6-7. Normal alignment. Prevertebral soft tissues are normal.  There is slight compression fracture through the superior endplates at T2. This is age indeterminate, but new since in 09/25/2014. No additional fracture. No epidural or paraspinal hematoma.  Small left apical pneumothorax noted as seen on chest CT.  IMPRESSION: No acute intracranial abnormality.  Slight compression fracture through the superior endplate at T2, age indeterminate but new since 09/25/2014.  Small left apical pneumothorax. See chest CT report for further discussion.   Electronically Signed   By: Charlett Nose M.D.   On: 07/04/2015 19:11   Ct Chest W Contrast  07/04/2015   CLINICAL DATA:  Fall, chest pain.  EXAM: CT  CHEST, ABDOMEN, AND PELVIS WITH CONTRAST  TECHNIQUE: Multidetector CT imaging of the chest, abdomen and pelvis was performed following the standard protocol during bolus administration of intravenous contrast.  CONTRAST:  80mL OMNIPAQUE IOHEXOL 300 MG/ML  SOLN  COMPARISON:  None.  FINDINGS: CT CHEST FINDINGS  There is a small left pleural effusion. Left lower lobe atelectasis. Multiple left rib fractures involving the sixth through tenth ribs posteriorly. Anterior rib fractures involving the left fifth, sixth and seventh ribs as well. Small amount subcutaneous air. Small left apical pneumothorax.  Linear atelectasis or scarring in the right lung base. Right lung otherwise clear. Heart is normal size. Aorta is normal caliber with scattered calcifications. No mediastinal, hilar, or axillary adenopathy. Chest wall soft tissues are unremarkable.  CT ABDOMEN AND PELVIS FINDINGS  Liver, spleen, pancreas, adrenals and kidneys are unremarkable. Stomach, large and small bowel grossly unremarkable. Prior hysterectomy. Urinary bladder is unremarkable. No adnexal masses. No free  fluid, free air or adenopathy. Aorta is calcified, non aneurysmal.  Bold healed left superior and inferior pubic rami fractures. Evidence of old right femoral neck fracture with screws in place. No acute bony abnormality within the pelvis. Severe compression fracture at T12, new since 2014 but this has a chronic/old appearance. Old L2 compression fracture with prior vertebroplasty. Posterior fusion changes at L4-5.  IMPRESSION: Multiple left rib fractures as described above. Small left pleural effusion with left lower lobe atelectasis. Small left apical pneumothorax.  No acute findings in the abdomen or pelvis.  Multiple lumbar compression fractures as described above have a chronic appearance.   Electronically Signed   By: Charlett Nose M.D.   On: 07/04/2015 19:02   Ct Cervical Spine Wo Contrast  07/04/2015   CLINICAL DATA:  Fall, dementia, chest  pain.  EXAM: CT HEAD WITHOUT CONTRAST  CT CERVICAL SPINE WITHOUT CONTRAST  TECHNIQUE: Multidetector CT imaging of the head and cervical spine was performed following the standard protocol without intravenous contrast. Multiplanar CT image reconstructions of the cervical spine were also generated.  COMPARISON:  09/25/2014  FINDINGS: CT HEAD FINDINGS  No acute intracranial abnormality. Specifically, no hemorrhage, hydrocephalus, mass lesion, acute infarction, or significant intracranial injury. No acute calvarial abnormality. Mild mucosal thickening in the paranasal sinuses. Mastoid air cells are clear.  CT CERVICAL SPINE FINDINGS  Diffuse bilateral degenerative facet disease. Mild degenerative disc disease at C5-6 and C6-7. Normal alignment. Prevertebral soft tissues are normal.  There is slight compression fracture through the superior endplates at T2. This is age indeterminate, but new since in 09/25/2014. No additional fracture. No epidural or paraspinal hematoma.  Small left apical pneumothorax noted as seen on chest CT.  IMPRESSION: No acute intracranial abnormality.  Slight compression fracture through the superior endplate at T2, age indeterminate but new since 09/25/2014.  Small left apical pneumothorax. See chest CT report for further discussion.   Electronically Signed   By: Charlett Nose M.D.   On: 07/04/2015 19:11   Ct Abdomen Pelvis W Contrast  07/04/2015   CLINICAL DATA:  Fall, chest pain.  EXAM: CT CHEST, ABDOMEN, AND PELVIS WITH CONTRAST  TECHNIQUE: Multidetector CT imaging of the chest, abdomen and pelvis was performed following the standard protocol during bolus administration of intravenous contrast.  CONTRAST:  80mL OMNIPAQUE IOHEXOL 300 MG/ML  SOLN  COMPARISON:  None.  FINDINGS: CT CHEST FINDINGS  There is a small left pleural effusion. Left lower lobe atelectasis. Multiple left rib fractures involving the sixth through tenth ribs posteriorly. Anterior rib fractures involving the left fifth,  sixth and seventh ribs as well. Small amount subcutaneous air. Small left apical pneumothorax.  Linear atelectasis or scarring in the right lung base. Right lung otherwise clear. Heart is normal size. Aorta is normal caliber with scattered calcifications. No mediastinal, hilar, or axillary adenopathy. Chest wall soft tissues are unremarkable.  CT ABDOMEN AND PELVIS FINDINGS  Liver, spleen, pancreas, adrenals and kidneys are unremarkable. Stomach, large and small bowel grossly unremarkable. Prior hysterectomy. Urinary bladder is unremarkable. No adnexal masses. No free fluid, free air or adenopathy. Aorta is calcified, non aneurysmal.  Bold healed left superior and inferior pubic rami fractures. Evidence of old right femoral neck fracture with screws in place. No acute bony abnormality within the pelvis. Severe compression fracture at T12, new since 2014 but this has a chronic/old appearance. Old L2 compression fracture with prior vertebroplasty. Posterior fusion changes at L4-5.  IMPRESSION: Multiple left rib fractures as described above. Small  left pleural effusion with left lower lobe atelectasis. Small left apical pneumothorax.  No acute findings in the abdomen or pelvis.  Multiple lumbar compression fractures as described above have a chronic appearance.   Electronically Signed   By: Charlett Nose M.D.   On: 07/04/2015 19:02    Scheduled Meds: . citalopram  10 mg Oral BID  . docusate sodium  100 mg Oral BID  . donepezil  10 mg Oral QHS  . metoprolol  25 mg Oral BID  . senna  1 tablet Oral BID  . sodium chloride  3 mL Intravenous Q12H  . traMADol  50 mg Oral 4 times per day   Continuous Infusions:  Antibiotics Given (last 72 hours)    None      Active Problems:   Essential hypertension   Polymyalgia rheumatica   Mild dementia   Rib fractures   Multiple rib fractures involving four or more ribs    Time spent:    [facility]  [facility] Pager [phone]. If 7PM-7AM,  please contact night-coverage at [email or web address], password [facility] 07/05/2015, 9:53 AM  LOS: 1 day

## 2015-07-05 NOTE — Progress Notes (Addendum)
Difficult for pt. to understand the importance of her treatment. She has removed IV,tele and pulse ox three times thus far.Becomes argumentative then tearful and apologetic. Will continue to try and enforce tx's. as ord.

## 2015-07-05 NOTE — Progress Notes (Signed)
Subjective: Stable and alert.  Denies shortness of breath.  Some left chest wall pain with deep inspiration. She did not understand the incentive spirometry.  I taught her and was able to get her to a vital capacity of 12 50 mL. SPO2 98%. Hemoglobin 15.9.  W BC 11,100.  Creatinine 0.62.  Chest x-ray this morning shows a very tiny left apical pneumothorax and a little bit of left basilar haziness which is probably a little bit of effusion and/or atelectasis.  No infiltrate  Objective: Vital signs in last 24 hours: Temp:  [97.7 F (36.5 C)-98.7 F (37.1 C)] 98.7 F (37.1 C) (07/30 2215) Pulse Rate:  [71-97] 79 (07/30 2215) Resp:  [16-21] 16 (07/30 2215) BP: (154-182)/(79-122) 154/122 mmHg (07/30 2215) SpO2:  [97 %-99 %] 99 % (07/30 2215) Weight:  [44.498 kg (98 lb 1.6 oz)] 44.498 kg (98 lb 1.6 oz) (07/30 2215) Last BM Date: 07/04/15  Intake/Output from previous day: 07/30 0701 - 07/31 0700 In: 485 [P.O.:360; I.V.:125] Out: -  Intake/Output this shift:    General appearance: Alert and cooperative.  Elderly.  Borderline deconditioned.  Very friendly.  Insight fair. Resp: Seems to be moving air fairly well at apices and at bases.  I don't hear any wheeze or rhonchi.  Left chest wall tender.  No paradoxical motion. GI: soft, non-tender; bowel sounds normal; no masses,  no organomegaly  Lab Results:   Recent Labs  07/04/15 1807 07/04/15 1819 07/05/15 0403  WBC 10.5  --  11.1*  HGB 15.6* 17.3* 15.9*  HCT 47.2* 51.0* 48.0*  PLT 246  --  270   BMET  Recent Labs  07/04/15 1819 07/05/15 0403  NA 137 135  K 4.2 4.2  CL 99* 95*  CO2  --  30  GLUCOSE 103* 97  BUN 14 10  CREATININE 0.60 0.62  CALCIUM  --  9.4   PT/INR No results for input(s): LABPROT, INR in the last 72 hours. ABG No results for input(s): PHART, HCO3 in the last 72 hours.  Invalid input(s): PCO2, PO2  Studies/Results: Dg Thoracic Spine W/swimmers  07/04/2015   CLINICAL DATA:  Fall in bathroom  yesterday.  Landed on left side.  EXAM: THORACIC SPINE - 3 VIEWS  COMPARISON:  CT 07/04/2015  FINDINGS: Mild compression fracture at L2 status post vertebroplasty. Severe compression fracture with kyphosis at the T12. No additional fracture.  IMPRESSION: Severe compression fracture at T12 as seen on prior CT. This is age indeterminate, but new since 2014. Favor chronic.   Electronically Signed   By: Charlett Nose M.D.   On: 07/04/2015 20:03   Dg Lumbar Spine Complete  07/04/2015   CLINICAL DATA:  Fall in bathroom yesterday.  Low back pain.  EXAM: LUMBAR SPINE - COMPLETE 4+ VIEW  COMPARISON:  07/19/2013  FINDINGS: Previous lumbar spine fusion at L4-5 appears stable. Old L2 vertebral body compression fracture with vertebroplasty appears stable. Old T12 vertebral body compression fracture is also unchanged.  No evidence of acute lumbar spine fracture or subluxation. Bilateral facet DJD is seen at L5-S1 with associated grade 1 anterolisthesis measuring 8 mm which is unchanged. Generalized osteopenia noted.  IMPRESSION: No acute findings.  Old T12 and L2 vertebral body compression fractures.  Stable bilateral L5-S1 facet DJD with grade 1 degenerative anterolisthesis. Stable appearance of fusion at L4-5.  Osteopenia.   Electronically Signed   By: Myles Rosenthal M.D.   On: 07/04/2015 20:06   Ct Head Wo Contrast  07/04/2015  CLINICAL DATA:  Fall, dementia, chest pain.  EXAM: CT HEAD WITHOUT CONTRAST  CT CERVICAL SPINE WITHOUT CONTRAST  TECHNIQUE: Multidetector CT imaging of the head and cervical spine was performed following the standard protocol without intravenous contrast. Multiplanar CT image reconstructions of the cervical spine were also generated.  COMPARISON:  09/25/2014  FINDINGS: CT HEAD FINDINGS  No acute intracranial abnormality. Specifically, no hemorrhage, hydrocephalus, mass lesion, acute infarction, or significant intracranial injury. No acute calvarial abnormality. Mild mucosal thickening in the  paranasal sinuses. Mastoid air cells are clear.  CT CERVICAL SPINE FINDINGS  Diffuse bilateral degenerative facet disease. Mild degenerative disc disease at C5-6 and C6-7. Normal alignment. Prevertebral soft tissues are normal.  There is slight compression fracture through the superior endplates at T2. This is age indeterminate, but new since in 09/25/2014. No additional fracture. No epidural or paraspinal hematoma.  Small left apical pneumothorax noted as seen on chest CT.  IMPRESSION: No acute intracranial abnormality.  Slight compression fracture through the superior endplate at T2, age indeterminate but new since 09/25/2014.  Small left apical pneumothorax. See chest CT report for further discussion.   Electronically Signed   By: Charlett Nose M.D.   On: 07/04/2015 19:11   Ct Chest W Contrast  07/04/2015   CLINICAL DATA:  Fall, chest pain.  EXAM: CT CHEST, ABDOMEN, AND PELVIS WITH CONTRAST  TECHNIQUE: Multidetector CT imaging of the chest, abdomen and pelvis was performed following the standard protocol during bolus administration of intravenous contrast.  CONTRAST:  80mL OMNIPAQUE IOHEXOL 300 MG/ML  SOLN  COMPARISON:  None.  FINDINGS: CT CHEST FINDINGS  There is a small left pleural effusion. Left lower lobe atelectasis. Multiple left rib fractures involving the sixth through tenth ribs posteriorly. Anterior rib fractures involving the left fifth, sixth and seventh ribs as well. Small amount subcutaneous air. Small left apical pneumothorax.  Linear atelectasis or scarring in the right lung base. Right lung otherwise clear. Heart is normal size. Aorta is normal caliber with scattered calcifications. No mediastinal, hilar, or axillary adenopathy. Chest wall soft tissues are unremarkable.  CT ABDOMEN AND PELVIS FINDINGS  Liver, spleen, pancreas, adrenals and kidneys are unremarkable. Stomach, large and small bowel grossly unremarkable. Prior hysterectomy. Urinary bladder is unremarkable. No adnexal masses. No  free fluid, free air or adenopathy. Aorta is calcified, non aneurysmal.  Bold healed left superior and inferior pubic rami fractures. Evidence of old right femoral neck fracture with screws in place. No acute bony abnormality within the pelvis. Severe compression fracture at T12, new since 2014 but this has a chronic/old appearance. Old L2 compression fracture with prior vertebroplasty. Posterior fusion changes at L4-5.  IMPRESSION: Multiple left rib fractures as described above. Small left pleural effusion with left lower lobe atelectasis. Small left apical pneumothorax.  No acute findings in the abdomen or pelvis.  Multiple lumbar compression fractures as described above have a chronic appearance.   Electronically Signed   By: Charlett Nose M.D.   On: 07/04/2015 19:02   Ct Cervical Spine Wo Contrast  07/04/2015   CLINICAL DATA:  Fall, dementia, chest pain.  EXAM: CT HEAD WITHOUT CONTRAST  CT CERVICAL SPINE WITHOUT CONTRAST  TECHNIQUE: Multidetector CT imaging of the head and cervical spine was performed following the standard protocol without intravenous contrast. Multiplanar CT image reconstructions of the cervical spine were also generated.  COMPARISON:  09/25/2014  FINDINGS: CT HEAD FINDINGS  No acute intracranial abnormality. Specifically, no hemorrhage, hydrocephalus, mass lesion, acute infarction, or significant intracranial  injury. No acute calvarial abnormality. Mild mucosal thickening in the paranasal sinuses. Mastoid air cells are clear.  CT CERVICAL SPINE FINDINGS  Diffuse bilateral degenerative facet disease. Mild degenerative disc disease at C5-6 and C6-7. Normal alignment. Prevertebral soft tissues are normal.  There is slight compression fracture through the superior endplates at T2. This is age indeterminate, but new since in 09/25/2014. No additional fracture. No epidural or paraspinal hematoma.  Small left apical pneumothorax noted as seen on chest CT.  IMPRESSION: No acute intracranial  abnormality.  Slight compression fracture through the superior endplate at T2, age indeterminate but new since 09/25/2014.  Small left apical pneumothorax. See chest CT report for further discussion.   Electronically Signed   By: Charlett Nose M.D.   On: 07/04/2015 19:11   Ct Abdomen Pelvis W Contrast  07/04/2015   CLINICAL DATA:  Fall, chest pain.  EXAM: CT CHEST, ABDOMEN, AND PELVIS WITH CONTRAST  TECHNIQUE: Multidetector CT imaging of the chest, abdomen and pelvis was performed following the standard protocol during bolus administration of intravenous contrast.  CONTRAST:  80mL OMNIPAQUE IOHEXOL 300 MG/ML  SOLN  COMPARISON:  None.  FINDINGS: CT CHEST FINDINGS  There is a small left pleural effusion. Left lower lobe atelectasis. Multiple left rib fractures involving the sixth through tenth ribs posteriorly. Anterior rib fractures involving the left fifth, sixth and seventh ribs as well. Small amount subcutaneous air. Small left apical pneumothorax.  Linear atelectasis or scarring in the right lung base. Right lung otherwise clear. Heart is normal size. Aorta is normal caliber with scattered calcifications. No mediastinal, hilar, or axillary adenopathy. Chest wall soft tissues are unremarkable.  CT ABDOMEN AND PELVIS FINDINGS  Liver, spleen, pancreas, adrenals and kidneys are unremarkable. Stomach, large and small bowel grossly unremarkable. Prior hysterectomy. Urinary bladder is unremarkable. No adnexal masses. No free fluid, free air or adenopathy. Aorta is calcified, non aneurysmal.  Bold healed left superior and inferior pubic rami fractures. Evidence of old right femoral neck fracture with screws in place. No acute bony abnormality within the pelvis. Severe compression fracture at T12, new since 2014 but this has a chronic/old appearance. Old L2 compression fracture with prior vertebroplasty. Posterior fusion changes at L4-5.  IMPRESSION: Multiple left rib fractures as described above. Small left pleural  effusion with left lower lobe atelectasis. Small left apical pneumothorax.  No acute findings in the abdomen or pelvis.  Multiple lumbar compression fractures as described above have a chronic appearance.   Electronically Signed   By: Charlett Nose M.D.   On: 07/04/2015 19:02    Anti-infectives: Anti-infectives    None      Assessment/Plan:  Ground-level fall.   Left posterior rib fractures 6 through 10 with some displacement.   Left anterior rib fractures 5 through 7 with minimal displacement.   Tiny left apical pneumothorax and tiny left basilar pleural effusion.   Chest tube not indicated.  Early dementia.  On Aricept Hypertension Polymyalgia rheumatica Tobacco use  Disposition.  We will intensify the incentive spirometry and ambulate her frequently. PT and OT consults to assess fall risk Probable discharge tomorrow   LOS: 1 day    Ernestene Mention 07/05/2015

## 2015-07-05 NOTE — Evaluation (Signed)
Physical Therapy Evaluation Patient Details Name: Shirley [name] MRN: [id number] DOB: [birth year] Today's Date: 07/05/2015   History of Present Illness  Pt is a 81 y/o F s/p fall w/ multiple L rib fracture and small pneumothorax.  Pt's PMH includes mild dementia, HTN, osteoporosis, plymyalgia rheumatica, posterior fusion lumbar spine, R TSA, R hip pinning.  Clinical Impression  Pt admitted with above diagnosis. Pt currently with functional limitations due to the deficits listed below (see PT Problem List). Shirley [name] requires min guard for ambulation w/ RW and needs 24/7 supervision at home.  Lives at home w/ husband who also has balance issues and daughter lives nearby but works during the day.  Pt would benefit from SW consult (for long term planning, see notes below). Pt will benefit from skilled PT to increase their independence and safety with mobility to allow discharge to the venue listed below. Recommending HHPT to address balance impairments, pt agreeable.    Follow Up Recommendations Home health PT;Supervision/Assistance - 24 hour    Equipment Recommendations  None recommended by PT    Recommendations for Other Services       Precautions / Restrictions Precautions Precautions: Fall Precaution Comments: pt admitted 2/2 fall; has h/o falls; L rib fx's Restrictions Weight Bearing Restrictions: No      Mobility  Bed Mobility Overal bed mobility: Needs Assistance Bed Mobility: Rolling;Sidelying to Sit Rolling: Min guard Sidelying to sit: Min guard       General bed mobility comments: Educated pt on log roll technique to R side which pt then demonstrated w/ HOB flat and no use of bed rails.  Transfers Overall transfer level: Needs assistance Equipment used: 1 person hand held assist;Rolling walker (2 wheeled) Transfers: Sit to/from Stand Sit to Stand: Min assist;Min guard         General transfer comment: 1 person hand held assit upon standing initially 2/2 mild  instability.  RW introduced during session and pt provided w/ VCs for proper management of RW to sit.  Ambulation/Gait Ambulation/Gait assistance: Min guard;Min assist Ambulation Distance (Feet): 150 Feet Assistive device: Rolling walker (2 wheeled);1 person hand held assist Gait Pattern/deviations: Step-through pattern;Decreased weight shift to right;Decreased stride length (leans L)   Gait velocity interpretation: Below normal speed for age/gender General Gait Details: Pt w/ L lateral lean.  1 person hand held assist initially to ambulate to bathroom (20 ft).  RW then introduced, pt requiring min guard assist.    Stairs Stairs: Yes Stairs assistance: Min guard Stair Management: One rail Right;Step to pattern;Forwards Number of Stairs: 12 General stair comments: Min guard for safety. Pt steps up/down facing forward but w/ BUEs supported on rail (her technique at home).  Attempted sideways; however pt prefered her method and felt more stable.  Wheelchair Mobility    Modified Rankin (Stroke Patients Only)       Balance Overall balance assessment: Needs assistance;History of Falls Sitting-balance support: No upper extremity supported;Feet supported Sitting balance-Leahy Scale: Good     Standing balance support: Bilateral upper extremity supported;Single extremity supported;During functional activity Standing balance-Leahy Scale: Poor Standing balance comment: Mild instability which improves w/ support from RW Single Leg Stance - Right Leg:  (15 seconds w/ BUEs supported on counter) Single Leg Stance - Left Leg:  (15 seconds w/ BUEs supported on counter)                         Pertinent Vitals/Pain Pain Assessment: Faces Faces Pain Scale:  Hurts even more Pain Location: L side at site of rib fx Pain Descriptors / Indicators: Aching;Nagging Pain Intervention(s): Limited activity within patient's tolerance;Monitored during session;Repositioned    Home Living  Family/patient expects to be discharged to:: Private residence Living Arrangements: Spouse/significant other (Husband dose not have good balance per pt) Available Help at Discharge: Family;Available PRN/intermittently (daughter lives nearby but works during day) Type of Home: House Home Access: Level entry     Home Layout: Multi-level Home Equipment: [name] - 2 wheels Additional Comments: 6 steps to get to bedroom/bathroom (R rail), 6 steps (R rail) to get up to other bedrooms and bathrooms.  Pt says she drives her husband but otherwise he takes care of himself.  Daughter comes over 1x/wk to do some housework (cleaning). Pt does laundry.    Prior Function Level of Independence: Independent with assistive device(s)         Comments: Uses cane prn "some days I don't feel as strong as others"     Hand Dominance   Dominant Hand: Right    Extremity/Trunk Assessment   Upper Extremity Assessment: Generalized weakness           Lower Extremity Assessment: Generalized weakness      Cervical / Trunk Assessment: Kyphotic  Communication   Communication: No difficulties  Cognition Arousal/Alertness: Awake/alert Behavior During Therapy: WFL for tasks assessed/performed Overall Cognitive Status: History of cognitive impairments - at baseline (mild dementia)                      General Comments General comments (skin integrity, edema, etc.): Pt would benefit from SW consult (spoke w/ SW) to talk w/ pt about long term living situation plan for pt and pt's husband.  Per pt, husband has poor balance as well and likely would additionally benefit from supervision at home.    Exercises General Exercises - Lower Extremity Mini-Sqauts: Strengthening;Both;10 reps;Standing      Assessment/Plan    PT Assessment Patient needs continued PT services  PT Diagnosis Difficulty walking;Abnormality of gait;Generalized weakness;Acute pain   PT Problem List Decreased  strength;Decreased activity tolerance;Decreased range of motion;Decreased balance;Decreased mobility;Decreased coordination;Decreased cognition;Decreased knowledge of use of DME;Decreased safety awareness;Decreased knowledge of precautions;Pain  PT Treatment Interventions DME instruction;Gait training;Stair training;Functional mobility training;Therapeutic activities;Therapeutic exercise;Balance training;Neuromuscular re-education;Cognitive remediation;Patient/family education;Modalities   PT Goals (Current goals can be found in the Care Plan section) Acute Rehab PT Goals Patient Stated Goal: to have her pain go away PT Goal Formulation: With patient Time For Goal Achievement: 07/19/15 Potential to Achieve Goals: Good    Frequency Min 3X/week   Barriers to discharge Inaccessible home environment;Decreased caregiver support Assist from daughter intermittent and 6 steps for pt to get to bedroom/bathroom at home.    Co-evaluation               End of Session Equipment Utilized During Treatment: Gait belt Activity Tolerance: Patient limited by fatigue Patient left: in chair;with call bell/phone within reach;with nursing/sitter in room Nurse Communication: Mobility status;Precautions    Functional Assessment Tool Used: Clinical Judgement Functional Limitation: Mobility: Walking and moving around Mobility: Walking and Moving Around Current Status (W0981): At least 20 percent but less than 40 percent impaired, limited or restricted Mobility: Walking and Moving Around Goal Status [id number]): At least 1 percent but less than 20 percent impaired, limited or restricted    Time: 1001-1032 PT Time Calculation (min) (ACUTE ONLY): 31 min   Charges:   PT Evaluation $Initial PT  Evaluation Tier I: 1 Procedure PT Treatments $Gait Training: 8-22 mins   PT G Codes:   PT G-Codes **NOT FOR INPATIENT CLASS** Functional Assessment Tool Used: Clinical Judgement Functional Limitation: Mobility:  Walking and moving around Mobility: Walking and Moving Around Current Status (W0981): At least 20 percent but less than 40 percent impaired, limited or restricted Mobility: Walking and Moving Around Goal Status [id number]): At least 1 percent but less than 20 percent impaired, limited or restricted   Michail Jewels PT, DPT [phone] Pager: [phone] 07/05/2015, 10:59 AM

## 2015-07-06 ENCOUNTER — Inpatient Hospital Stay (HOSPITAL_COMMUNITY): Payer: Medicare Other

## 2015-07-06 DIAGNOSIS — I10 Essential (primary) hypertension: Secondary | ICD-10-CM

## 2015-07-06 LAB — CBC
HCT: 45.9 % (ref 36.0–46.0)
Hemoglobin: 15.3 g/dL — ABNORMAL HIGH (ref 12.0–15.0)
MCH: 32.6 pg (ref 26.0–34.0)
MCHC: 33.3 g/dL (ref 30.0–36.0)
MCV: 97.7 fL (ref 78.0–100.0)
PLATELETS: 243 10*3/uL (ref 150–400)
RBC: 4.7 MIL/uL (ref 3.87–5.11)
RDW: 13 % (ref 11.5–15.5)
WBC: 8.7 10*3/uL (ref 4.0–10.5)

## 2015-07-06 LAB — BASIC METABOLIC PANEL
Anion gap: 8 (ref 5–15)
BUN: 13 mg/dL (ref 6–20)
CO2: 26 mmol/L (ref 22–32)
CREATININE: 0.5 mg/dL (ref 0.44–1.00)
Calcium: 8.7 mg/dL — ABNORMAL LOW (ref 8.9–10.3)
Chloride: 98 mmol/L — ABNORMAL LOW (ref 101–111)
GFR calc Af Amer: >60 mL/min (ref >60)
Glucose, Bld: 109 mg/dL — ABNORMAL HIGH (ref 65–99)
Potassium: 3.9 mmol/L (ref 3.5–5.1)
Sodium: 132 mmol/L — ABNORMAL LOW (ref 135–145)

## 2015-07-06 MED ORDER — POLYETHYLENE GLYCOL 3350 17 G PO PACK: 17.0000 g | PACK | Freq: Every day | ORAL | Status: DC | PRN

## 2015-07-06 MED ORDER — TRAMADOL HCL 50 MG PO TABS: 50.0000 mg | ORAL_TABLET | Freq: Four times a day (QID) | ORAL | Status: DC | PRN

## 2015-07-06 MED ORDER — NAPROXEN 250 MG PO TABS: 250.0000 mg | ORAL_TABLET | Freq: Two times a day (BID) | ORAL | Status: DC

## 2015-07-06 MED ORDER — TRAMADOL HCL 50 MG PO TABS
100.0000 mg | ORAL_TABLET | Freq: Four times a day (QID) | ORAL | Status: DC
  Administered 2015-07-06: 100 mg via ORAL
  Filled 2015-07-06 (×2): qty 2

## 2015-07-06 MED ORDER — NAPROXEN 250 MG PO TABS
500.0000 mg | ORAL_TABLET | Freq: Two times a day (BID) | ORAL | Status: DC
  Administered 2015-07-06: 500 mg via ORAL
  Filled 2015-07-06: qty 2

## 2015-07-06 NOTE — Progress Notes (Signed)
Physical Therapy Treatment Patient Details Name: Shirley [name] MRN: [id number] DOB: 04-Oct-1934 Today's Date: 07/06/2015    History of Present Illness Pt is a 81 y/o F s/p fall w/ multiple L rib fracture and small pneumothorax.  Pt's PMH includes mild dementia, HTN, osteoporosis, plymyalgia rheumatica, posterior fusion lumbar spine, R TSA, R hip pinning.    PT Comments    Pt was seen for evaluation of her current mobility and still very limited help will suffice for her.  She is planning to go home with husband but will not be able to care for her.  Her plan is for 24/7 help as she cannot walk distances alone.    Follow Up Recommendations  Home health PT;Supervision/Assistance - 24 hour     Equipment Recommendations  None recommended by PT    Recommendations for Other Services Rehab consult     Precautions / Restrictions Precautions Precautions: Fall Precaution Comments: Rib fractures L side Restrictions Weight Bearing Restrictions: No    Mobility  Bed Mobility Overal bed mobility: Needs Assistance Bed Mobility: Supine to Sit Rolling: Min guard Sidelying to sit: Min guard       General bed mobility comments: Pt found seated in recliner, see PT eval for more information on bed mobility  Transfers Overall transfer level: Needs assistance Equipment used: Rolling walker (2 wheeled) Transfers: Sit to/from [employer] Sit to Stand: Min guard Stand pivot transfers: Min guard       General transfer comment: Min guard for safety. Cues for safety, hand placement, and sequencing using RW.   Ambulation/Gait Ambulation/Gait assistance: Min guard Ambulation Distance (Feet): 175 Feet Assistive device: Rolling walker (2 wheeled);1 person hand held assist Gait Pattern/deviations: Step-through pattern;Decreased stride length;Drifts right/left;Wide base of support Gait velocity: reduced Gait velocity interpretation: Below normal speed for age/gender General Gait  Details: RW supports L side list, PT helping her to control walker as pt tends to drit to L side   Stairs            Wheelchair Mobility    Modified Rankin (Stroke Patients Only)       Balance Overall balance assessment: Needs assistance Sitting-balance support: No upper extremity supported;Feet supported Sitting balance-Leahy Scale: Good     Standing balance support: Bilateral upper extremity supported Standing balance-Leahy Scale: Fair                      Cognition Arousal/Alertness: Awake/alert Behavior During Therapy: WFL for tasks assessed/performed Overall Cognitive Status: History of cognitive impairments - at baseline       Memory: Decreased recall of precautions;Decreased short-term memory              Exercises General Exercises - Lower Extremity Ankle Circles/Pumps: Strengthening;Both;10 reps Long Arc Quad: Strengthening;Both;10 reps Heel Slides: Strengthening;Both;10 reps Hip ABduction/ADduction: Strengthening;Both;10 reps    General Comments General comments (skin integrity, edema, etc.): Pt is safe for short trips with another person assisting her to maneuver the walker.  Pt may not be safe at home with husband if he cannot assist her.      Pertinent Vitals/Pain Pain Assessment: Faces Faces Pain Scale: Hurts little more Pain Location: L rib fracture site Pain Descriptors / Indicators: Guarding;Tightness Pain Intervention(s): Limited activity within patient's tolerance;Monitored during session;Repositioned    Home Living Family/patient expects to be discharged to:: Private residence Living Arrangements: Spouse/significant other Available Help at Discharge: Family;Available PRN/intermittently (pt reports daughter and son live near by, but works during the day)  Type of Home: House Home Access: Level entry   Home Layout: Multi-level Home Equipment: [name] - 2 wheels;Tub bench Additional Comments: 6 steps to get to  bedroom/bathroom (R rail), 6 steps (R rail) to get up to other bedrooms and bathrooms.  Pt says she drives her husband but otherwise he takes care of himself.  Daughter comes over 1x/wk to do some housework (cleaning). Pt does laundry.    Prior Function Level of Independence: Independent with assistive device(s)      Comments: Uses cane prn "some days I don't feel as strong as others"   PT Goals (current goals can now be found in the care plan section) Acute Rehab PT Goals Patient Stated Goal: Go home soon Progress towards PT goals: Progressing toward goals    Frequency  Min 3X/week    PT Plan Current plan remains appropriate    Co-evaluation             End of Session   Activity Tolerance: Patient limited by fatigue Patient left: in bed;with call bell/phone within reach;with nursing/sitter in room;Other (comment) (Has been up in chair awhile earlier)     Time: 1610-9604 PT Time Calculation (min) (ACUTE ONLY): 24 min  Charges:  $Gait Training: 8-22 mins $Therapeutic Exercise: 8-22 mins                    G Codes:      Ivar Drape 08-01-15, 11:55 AM   Samul Dada, PT MS Acute Rehab Dept. Number: [facility] [id number] and [facility] [phone]

## 2015-07-06 NOTE — Progress Notes (Signed)
Occupational Therapy Evaluation Patient Details Name: Shirley [name] MRN: [id number] DOB: [birth year] Today's Date: 07/06/2015    History of Present Illness Pt is a 81 y/o F s/p fall w/ multiple L rib fracture and small pneumothorax.  Pt's PMH includes mild dementia, HTN, osteoporosis, plymyalgia rheumatica, posterior fusion lumbar spine, R TSA, R hip pinning.   Clinical Impression   Patient presenting with deconditioning and decreased safety awareness. Patient independent -> mod I PTA. Patient currently functioning at an overall supervision level using RW. Patient will benefit from acute OT to increase overall independence in the areas of ADLs, functional mobility, and overall safety in order to safely discharge home with 24/7 supervision.     Follow Up Recommendations  No OT follow up;Supervision/Assistance - 24 hour    Equipment Recommendations  3 in 1 bedside comode    Recommendations for Other Services  None at this time   Precautions / Restrictions Precautions Precautions: Fall Precaution Comments: pt admitted 2/2 fall; has h/o falls; L rib fx's Restrictions Weight Bearing Restrictions: No    Mobility Bed Mobility General bed mobility comments: Pt found seated in recliner, see PT eval for more information on bed mobility  Transfers Overall transfer level: Needs assistance Equipment used: Rolling walker (2 wheeled) Transfers: Sit to/from Stand Sit to Stand: Min guard         General transfer comment: Min guard for safety. Cues for safety, hand placement, and sequencing using RW.     Balance Overall balance assessment: Needs assistance Sitting-balance support: No upper extremity supported;Feet supported Sitting balance-Leahy Scale: Good     Standing balance support: Bilateral upper extremity supported;During functional activity Standing balance-Leahy Scale: Fair    ADL Overall ADL's : Needs assistance/impaired                                       General ADL Comments: Pt overall supervision for ADLs using RW for sit<>stands and functional mobility. Pt needs 24/7 supervision post acute d/c for overall safey and to decrease fall risk.     Pertinent Vitals/Pain Pain Assessment: No/denies pain     Hand Dominance Right   Extremity/Trunk Assessment Upper Extremity Assessment Upper Extremity Assessment: Generalized weakness   Lower Extremity Assessment Lower Extremity Assessment: Generalized weakness   Cervical / Trunk Assessment Cervical / Trunk Assessment: Kyphotic   Communication Communication Communication: No difficulties   Cognition Arousal/Alertness: Awake/alert Behavior During Therapy: WFL for tasks assessed/performed Overall Cognitive Status: History of cognitive impairments - at baseline (mild dementia)              Home Living Family/patient expects to be discharged to:: Private residence Living Arrangements: Spouse/significant other Available Help at Discharge: Family;Available PRN/intermittently (pt reports daughter and son live near by, but works during the day) Type of Home: House Home Access: Level entry     Home Layout: Multi-level Alternate Level Stairs-Number of Steps: 6 Alternate Level Stairs-Rails: Right Bathroom Shower/Tub: Tub/shower unit;Curtain   Firefighter: Standard     Home Equipment: [name] - 2 wheels;Tub bench   Additional Comments: 6 steps to get to bedroom/bathroom (R rail), 6 steps (R rail) to get up to other bedrooms and bathrooms.  Pt says she drives her husband but otherwise he takes care of himself.  Daughter comes over 1x/wk to do some housework (cleaning). Pt does laundry.      Prior Functioning/Environment Level of Independence:  Independent with assistive device(s)  Comments: Uses cane prn "some days I don't feel as strong as others"    OT Diagnosis: Generalized weakness;Acute pain   OT Problem List: Decreased strength;Decreased activity  tolerance;Impaired balance (sitting and/or standing);Decreased knowledge of use of DME or AE;Pain;Decreased safety awareness   OT Treatment/Interventions: Self-care/ADL training;Therapeutic exercise;DME and/or AE instruction;Energy conservation;Patient/family education;Balance training;Therapeutic activities    OT Goals(Current goals can be found in the care plan section) Acute Rehab OT Goals Patient Stated Goal: Go home soon OT Goal Formulation: With patient Time For Goal Achievement: 07/20/15 Potential to Achieve Goals: Good ADL Goals Pt Will Perform Grooming: with modified independence;standing Pt Will Perform Lower Body Bathing: with modified independence;sit to/from stand Pt Will Perform Lower Body Dressing: with modified independence;sit to/from stand Pt Will Transfer to Toilet: with modified independence;bedside commode;ambulating Pt Will Perform Toileting - Clothing Manipulation and hygiene: with modified independence;sit to/from stand Pt Will Perform Tub/Shower Transfer: Tub transfer;tub bench;rolling walker;ambulating Additional ADL Goal #1: Pt will be able to verbalize understanding of need for 24/7 supervision post acute d/c for her safety  OT Frequency: Min 2X/week   Barriers to D/C: None known at this time   End of Session Equipment Utilized During Treatment: Gait belt;Rolling walker  Activity Tolerance: Patient tolerated treatment well Patient left: in chair;with call bell/phone within reach;with nursing/sitter in room   Time: 1610-9604 OT Time Calculation (min): 21 min Charges:  OT General Charges $OT Visit: 1 Procedure OT Evaluation $Initial OT Evaluation Tier I: 1 Procedure  Chi Woodham , MS, OTR/L, CLT Pager: [phone]  07/06/2015, 8:58 AM

## 2015-07-06 NOTE — Care Management Important Message (Signed)
Important Message  Patient Details  Name: Shirley [name] MRN: [id number] Date of Birth: 07-20-1934   Medicare Important Message Given:  Yes-second notification given    Orson Aloe 07/06/2015, 12:05 PM

## 2015-07-06 NOTE — Discharge Summary (Signed)
Physician Discharge Summary  Shirley [name] ZOX:[id number] DOB: 09/22/1934 DOA: 07/04/2015  PCP: Evette Georges, MD  Admit date: 07/04/2015 Discharge date: 07/06/2015  Time spent: 45 minutes  Recommendations for Outpatient Follow-up:  1. PCP in 1 week 2. Home health services  Discharge Diagnoses:    Fall    Rib fractures   Multiple rib fractures involving four or more ribs   Essential hypertension   Polymyalgia rheumatica   Mild dementia     Discharge Condition: stable  Diet recommendation: regular  Filed Weights   07/04/15 2215  Weight: 44.498 kg (98 lb 1.6 oz)    History of present illness:  Chief Complaint: fall  HPI: Shirley [name] is a 81 y.o. female as a past medical history of Dementia, Hypertension; Osteoporosis; PMR (polymyalgia rheumatica); Tobacco abuse; Presented with fall 7/29 evening in her bathroom she was unsure of details but thought she tripped on a stairs going to the bathroom at night. Patient hit her left side. Patient presented with abrasion and skin tear to her left arm as well as pain in her left chest.  In emergency department patient was found to have multiple left rib fractures and small left pleural effusion small left apical pneumothorax. Multiple compression fractures which were chronic. CT scan of the head show no evidence of intracranial bleeding.    Hospital Course:  1. Fall/multiple left rib fractures with small pneumothorax -followed by Trauma surgery, treated with pain control, NSAIDs/Tramadol per [facility] -Incentive spirometry used and taught,  -CXR repeat improved, ambulating with PT, will be discharged home with home health services  2. Essential hypertension - continue metoprolol   3. Mild dementia -stable, continue aricept  4. PMR -not on meds, stable, monitor  Consultations: Trauma Surgery  Discharge Exam: Filed Vitals:   07/06/15 0644  BP: 131/69  Pulse: 55  Temp: 98 F (36.7 C)  Resp: 18    General:  AAOx3 Cardiovascular:S1S2/RRR Respiratory: CTAB  Discharge Instructions   Discharge Instructions    Diet - low sodium heart healthy    Complete by:  As directed      Discharge instructions    Complete by:  As directed   Please use Incentive spirometry every hour when awake     Increase activity slowly    Complete by:  As directed           Current Discharge Medication List    START taking these medications   Details  naproxen (NAPROSYN) 250 MG tablet Take 1 tablet (250 mg total) by mouth 2 (two) times daily with a meal. For 5 days Qty: 10 tablet, Refills: 0    polyethylene glycol (MIRALAX / GLYCOLAX) packet Take 17 g by mouth daily as needed for mild constipation. Qty: 14 each, Refills: 0    traMADol (ULTRAM) 50 MG tablet Take 1 tablet (50 mg total) by mouth every 6 (six) hours as needed. Qty: 30 tablet, Refills: 0      CONTINUE these medications which have NOT CHANGED   Details  citalopram (CELEXA) 10 MG tablet Take 2 tablets daily Qty: 180 tablet, Refills: 3   Associated Diagnoses: Anxiety    donepezil (ARICEPT) 10 MG tablet Take 1 tablet (10 mg total) by mouth at bedtime. Qty: 90 tablet, Refills: 3   Associated Diagnoses: Mild dementia    metoprolol (LOPRESSOR) 50 MG tablet 1 by mouth each bedtime Qty: 100 tablet, Refills: 3   Associated Diagnoses: Essential hypertension       No Known Allergies Follow-up  Information    Follow up with TODD,JEFFREY ALLEN, MD. Schedule an appointment as soon as possible for a visit in 1 week.   Specialty:  Family Medicine   Contact information:   [address] [place] [address] [phone]        The results of significant diagnostics from this hospitalization (including imaging, microbiology, ancillary and laboratory) are listed below for reference.    Significant Diagnostic Studies: Dg Chest 2 View  07/06/2015   CLINICAL DATA:  Traumatic fractures of multiple left ribs with apical pneumothorax.  Subsequent encounter.  EXAM: CHEST - 2 VIEW  COMPARISON:  07/05/2015  FINDINGS: The heart size and mediastinal contours are within normal limits. The tiny left apical pneumothorax is stable to slightly diminished. There is slight increase in left lower lobe atelectasis. Multiple mildly displaced posterior left rib fractures are again noted. No pulmonary edema.  IMPRESSION: Stable to slightly diminished tiny left apical pneumothorax. Slight increase in left lower lobe atelectasis.   Electronically Signed   By: Irish Lack M.D.   On: 07/06/2015 08:08   Dg Chest 2 View  07/05/2015   CLINICAL DATA:  Fall down steps. Landed on chest. LEFT pneumothorax and rib fractures.  EXAM: CHEST  2 VIEW  COMPARISON:  CT thorax 07/04/2015 is  FINDINGS: LEFT apical pneumothorax not increased in volume compared to prior CT. Posterior LEFT rib fractures are also again noted. There is small LEFT effusion and LEFT basilar atelectasis. Lower thoracic spine compression fracture again noted.  IMPRESSION: 1. No change in small LEFT apical pneumothorax. 2. LEFT posterior rib fractures with LEFT basilar atelectasis and small effusion.   Electronically Signed   By: Genevive Bi M.D.   On: 07/05/2015 09:47   Dg Thoracic Spine W/swimmers  07/04/2015   CLINICAL DATA:  Fall in bathroom yesterday.  Landed on left side.  EXAM: THORACIC SPINE - 3 VIEWS  COMPARISON:  CT 07/04/2015  FINDINGS: Mild compression fracture at L2 status post vertebroplasty. Severe compression fracture with kyphosis at the T12. No additional fracture.  IMPRESSION: Severe compression fracture at T12 as seen on prior CT. This is age indeterminate, but new since 2014. Favor chronic.   Electronically Signed   By: Charlett Nose M.D.   On: 07/04/2015 20:03   Dg Lumbar Spine Complete  07/04/2015   CLINICAL DATA:  Fall in bathroom yesterday.  Low back pain.  EXAM: LUMBAR SPINE - COMPLETE 4+ VIEW  COMPARISON:  07/19/2013  FINDINGS: Previous lumbar spine fusion at L4-5  appears stable. Old L2 vertebral body compression fracture with vertebroplasty appears stable. Old T12 vertebral body compression fracture is also unchanged.  No evidence of acute lumbar spine fracture or subluxation. Bilateral facet DJD is seen at L5-S1 with associated grade 1 anterolisthesis measuring 8 mm which is unchanged. Generalized osteopenia noted.  IMPRESSION: No acute findings.  Old T12 and L2 vertebral body compression fractures.  Stable bilateral L5-S1 facet DJD with grade 1 degenerative anterolisthesis. Stable appearance of fusion at L4-5.  Osteopenia.   Electronically Signed   By: Myles Rosenthal M.D.   On: 07/04/2015 20:06   Ct Head Wo Contrast  07/04/2015   CLINICAL DATA:  Fall, dementia, chest pain.  EXAM: CT HEAD WITHOUT CONTRAST  CT CERVICAL SPINE WITHOUT CONTRAST  TECHNIQUE: Multidetector CT imaging of the head and cervical spine was performed following the standard protocol without intravenous contrast. Multiplanar CT image reconstructions of the cervical spine were also generated.  COMPARISON:  09/25/2014  FINDINGS: CT  HEAD FINDINGS  No acute intracranial abnormality. Specifically, no hemorrhage, hydrocephalus, mass lesion, acute infarction, or significant intracranial injury. No acute calvarial abnormality. Mild mucosal thickening in the paranasal sinuses. Mastoid air cells are clear.  CT CERVICAL SPINE FINDINGS  Diffuse bilateral degenerative facet disease. Mild degenerative disc disease at C5-6 and C6-7. Normal alignment. Prevertebral soft tissues are normal.  There is slight compression fracture through the superior endplates at T2. This is age indeterminate, but new since in 09/25/2014. No additional fracture. No epidural or paraspinal hematoma.  Small left apical pneumothorax noted as seen on chest CT.  IMPRESSION: No acute intracranial abnormality.  Slight compression fracture through the superior endplate at T2, age indeterminate but new since 09/25/2014.  Small left apical  pneumothorax. See chest CT report for further discussion.   Electronically Signed   By: Charlett Nose M.D.   On: 07/04/2015 19:11   Ct Chest W Contrast  07/04/2015   CLINICAL DATA:  Fall, chest pain.  EXAM: CT CHEST, ABDOMEN, AND PELVIS WITH CONTRAST  TECHNIQUE: Multidetector CT imaging of the chest, abdomen and pelvis was performed following the standard protocol during bolus administration of intravenous contrast.  CONTRAST:  80mL OMNIPAQUE IOHEXOL 300 MG/ML  SOLN  COMPARISON:  None.  FINDINGS: CT CHEST FINDINGS  There is a small left pleural effusion. Left lower lobe atelectasis. Multiple left rib fractures involving the sixth through tenth ribs posteriorly. Anterior rib fractures involving the left fifth, sixth and seventh ribs as well. Small amount subcutaneous air. Small left apical pneumothorax.  Linear atelectasis or scarring in the right lung base. Right lung otherwise clear. Heart is normal size. Aorta is normal caliber with scattered calcifications. No mediastinal, hilar, or axillary adenopathy. Chest wall soft tissues are unremarkable.  CT ABDOMEN AND PELVIS FINDINGS  Liver, spleen, pancreas, adrenals and kidneys are unremarkable. Stomach, large and small bowel grossly unremarkable. Prior hysterectomy. Urinary bladder is unremarkable. No adnexal masses. No free fluid, free air or adenopathy. Aorta is calcified, non aneurysmal.  Bold healed left superior and inferior pubic rami fractures. Evidence of old right femoral neck fracture with screws in place. No acute bony abnormality within the pelvis. Severe compression fracture at T12, new since 2014 but this has a chronic/old appearance. Old L2 compression fracture with prior vertebroplasty. Posterior fusion changes at L4-5.  IMPRESSION: Multiple left rib fractures as described above. Small left pleural effusion with left lower lobe atelectasis. Small left apical pneumothorax.  No acute findings in the abdomen or pelvis.  Multiple lumbar compression  fractures as described above have a chronic appearance.   Electronically Signed   By: Charlett Nose M.D.   On: 07/04/2015 19:02   Ct Cervical Spine Wo Contrast  07/04/2015   CLINICAL DATA:  Fall, dementia, chest pain.  EXAM: CT HEAD WITHOUT CONTRAST  CT CERVICAL SPINE WITHOUT CONTRAST  TECHNIQUE: Multidetector CT imaging of the head and cervical spine was performed following the standard protocol without intravenous contrast. Multiplanar CT image reconstructions of the cervical spine were also generated.  COMPARISON:  09/25/2014  FINDINGS: CT HEAD FINDINGS  No acute intracranial abnormality. Specifically, no hemorrhage, hydrocephalus, mass lesion, acute infarction, or significant intracranial injury. No acute calvarial abnormality. Mild mucosal thickening in the paranasal sinuses. Mastoid air cells are clear.  CT CERVICAL SPINE FINDINGS  Diffuse bilateral degenerative facet disease. Mild degenerative disc disease at C5-6 and C6-7. Normal alignment. Prevertebral soft tissues are normal.  There is slight compression fracture through the superior endplates at T2. This is  age indeterminate, but new since in 09/25/2014. No additional fracture. No epidural or paraspinal hematoma.  Small left apical pneumothorax noted as seen on chest CT.  IMPRESSION: No acute intracranial abnormality.  Slight compression fracture through the superior endplate at T2, age indeterminate but new since 09/25/2014.  Small left apical pneumothorax. See chest CT report for further discussion.   Electronically Signed   By: Charlett Nose M.D.   On: 07/04/2015 19:11   Ct Abdomen Pelvis W Contrast  07/04/2015   CLINICAL DATA:  Fall, chest pain.  EXAM: CT CHEST, ABDOMEN, AND PELVIS WITH CONTRAST  TECHNIQUE: Multidetector CT imaging of the chest, abdomen and pelvis was performed following the standard protocol during bolus administration of intravenous contrast.  CONTRAST:  80mL OMNIPAQUE IOHEXOL 300 MG/ML  SOLN  COMPARISON:  None.  FINDINGS: CT  CHEST FINDINGS  There is a small left pleural effusion. Left lower lobe atelectasis. Multiple left rib fractures involving the sixth through tenth ribs posteriorly. Anterior rib fractures involving the left fifth, sixth and seventh ribs as well. Small amount subcutaneous air. Small left apical pneumothorax.  Linear atelectasis or scarring in the right lung base. Right lung otherwise clear. Heart is normal size. Aorta is normal caliber with scattered calcifications. No mediastinal, hilar, or axillary adenopathy. Chest wall soft tissues are unremarkable.  CT ABDOMEN AND PELVIS FINDINGS  Liver, spleen, pancreas, adrenals and kidneys are unremarkable. Stomach, large and small bowel grossly unremarkable. Prior hysterectomy. Urinary bladder is unremarkable. No adnexal masses. No free fluid, free air or adenopathy. Aorta is calcified, non aneurysmal.  Bold healed left superior and inferior pubic rami fractures. Evidence of old right femoral neck fracture with screws in place. No acute bony abnormality within the pelvis. Severe compression fracture at T12, new since 2014 but this has a chronic/old appearance. Old L2 compression fracture with prior vertebroplasty. Posterior fusion changes at L4-5.  IMPRESSION: Multiple left rib fractures as described above. Small left pleural effusion with left lower lobe atelectasis. Small left apical pneumothorax.  No acute findings in the abdomen or pelvis.  Multiple lumbar compression fractures as described above have a chronic appearance.   Electronically Signed   By: Charlett Nose M.D.   On: 07/04/2015 19:02    Microbiology: No results found for this or any previous visit (from the past 240 hour(s)).   Labs: Basic Metabolic Panel:  Recent Labs Lab 07/04/15 1819 07/05/15 0403 07/06/15 0406  NA 137 135 132*  K 4.2 4.2 3.9  CL 99* 95* 98*  CO2  --  30 26  GLUCOSE 103* 97 109*  BUN 14 10 13   CREATININE 0.60 0.62 0.50  CALCIUM  --  9.4 8.7*  MG  --  2.0  --   PHOS  --   3.3  --    Liver Function Tests:  Recent Labs Lab 07/05/15 0403  AST 29  ALT 17  ALKPHOS 79  BILITOT 1.4*  PROT 7.4  ALBUMIN 3.4*   No results for input(s): LIPASE, AMYLASE in the last 168 hours. No results for input(s): AMMONIA in the last 168 hours. CBC:  Recent Labs Lab 07/04/15 1807 07/04/15 1819 07/05/15 0403 07/06/15 0406  WBC 10.5  --  11.1* 8.7  NEUTROABS 8.6*  --   --   --   HGB 15.6* 17.3* 15.9* 15.3*  HCT 47.2* 51.0* 48.0* 45.9  MCV 96.3  --  97.4 97.7  PLT 246  --  270 243   Cardiac Enzymes: No results for input(s):  CKTOTAL, CKMB, CKMBINDEX, TROPONINI in the last 168 hours. BNP: BNP (last 3 results) No results for input(s): BNP in the last 8760 hours.  ProBNP (last 3 results) No results for input(s): PROBNP in the last 8760 hours.  CBG: No results for input(s): GLUCAP in the last 168 hours.     SignedZannie Cove  [facility] 07/06/2015, 12:27 PM

## 2015-07-06 NOTE — Care Management Note (Addendum)
Case Management Note  Patient Details  Name: Shirley [name] MRN: [id number] Date of Birth: May 16, 1934  Subjective/Objective:                    Action/Plan: Todd called back . He spoke with his father , patient does need 3 in 1 commode will order and have delivered to hospital room . Ronny Flurry RN BSN     Patient with dementia.   Attempted to call [facility] [phone] , cell [phone] , no answer and no message system.   Called patient's son Tawanna Cooler cell [phone] , stated his father does no usually answer the phone and when he does he "has trouble with the volume".   Tawanna Cooler states he is the best contact person for his mother .   Patient has had [facility] is the past and would like home health PT/RN through [facility] again . Referral given to [facility] . Tawanna Cooler states his mother already has 3 in 1 bedside commode from when she had hip fracture .     Expected Discharge Date:                  Expected Discharge Plan:  Home w Home Health Services  In-House Referral:     Discharge planning Services  CM Consult  Post Acute Care Choice:  Home Health Choice offered to:     DME Arranged:    DME Agency:     HH Arranged:  RN, PT HH Agency:  [facility]  Status of Service:  Completed, signed off  Medicare Important Message Given:    Date Medicare IM Given:    Medicare IM give by:    Date Additional Medicare IM Given:    Additional Medicare Important Message give by:     If discussed at Long Length of Stay Meetings, dates discussed:    Additional Comments:  Kingsley Plan, RN 07/06/2015, 9:56 AM

## 2015-07-06 NOTE — Progress Notes (Signed)
Discussed discharge summary with patient and patients son. Reviewed all medications with patients son. Patient received Rx. Patient ready for discharge.

## 2015-07-06 NOTE — Progress Notes (Signed)
Patient ID: Shirley [name], female   DOB: 1934-04-02, 81 y.o.   MRN: [id number]   LOS: 2 days   Subjective: C/o chest pain.   Objective: Vital signs in last 24 hours: Temp:  [97.5 F (36.4 C)-98.5 F (36.9 C)] 98 F (36.7 C) (08/01 0644) Pulse Rate:  [55-86] 55 (08/01 0644) Resp:  [16-18] 18 (08/01 0644) BP: (111-146)/(68-91) 131/69 mmHg (08/01 0644) SpO2:  [95 %-99 %] 97 % (08/01 0644) Last BM Date: 07/04/15   IS: (-500)   Laboratory  CBC  Recent Labs  07/05/15 0403 07/06/15 0406  WBC 11.1* 8.7  HGB 15.9* 15.3*  HCT 48.0* 45.9  PLT 270 243   BMET  Recent Labs  07/05/15 0403 07/06/15 0406  NA 135 132*  K 4.2 3.9  CL 95* 98*  CO2 30 26  GLUCOSE 97 109*  BUN 10 13  CREATININE 0.62 0.50  CALCIUM 9.4 8.7*    Radiology Results CXR: PTX not significantly worse, left basilar ATX unchanged (official read pending)   Physical Exam General appearance: alert and no distress Resp: clear to auscultation bilaterally Cardio: regular rate and rhythm GI: normal findings: bowel sounds normal and soft, non-tender   Assessment/Plan: Fall Multiple right rib fxs -- Pulmonary toilet. Will increase scheduled tramadol and add NSAID Multiple medical problems -- per primary service    Freeman Caldron, PA-C Pager: [phone] General Trauma PA Pager: [phone]  07/06/2015

## 2015-07-07 ENCOUNTER — Telehealth: Payer: Self-pay | Admitting: *Deleted

## 2015-07-07 ENCOUNTER — Telehealth: Payer: Self-pay | Admitting: Family Medicine

## 2015-07-07 DIAGNOSIS — R269 Unspecified abnormalities of gait and mobility: Secondary | ICD-10-CM | POA: Diagnosis not present

## 2015-07-07 DIAGNOSIS — Z9181 History of falling: Secondary | ICD-10-CM | POA: Diagnosis not present

## 2015-07-07 NOTE — Telephone Encounter (Signed)
Spoke to [employer] with [facility], verbal order given for speech therapy and occupational therapy at home for pt okay per Dr.K. Herbert Seta verbalized understanding.

## 2015-07-07 NOTE — Telephone Encounter (Signed)
ok 

## 2015-07-07 NOTE — Telephone Encounter (Signed)
Attempted to call patient but no answer at number given.

## 2015-07-07 NOTE — Telephone Encounter (Signed)
Please see message and advise 

## 2015-07-07 NOTE — Telephone Encounter (Signed)
Heather from [place] [phone] is needing a verbal order for speech therapy and occupational therapy at home. The patient denies any changes as for as reverse reaction to Celexa and Donepezil. The patient was admitted to [facility] from coming home from a fall.

## 2015-07-07 NOTE — Telephone Encounter (Signed)
Transition Care Management Follow-up Telephone Call  How have you been since you were released from the hospital? Doing well   Do you understand why you were in the hospital? yes   Do you understand the discharge instrcutions? yes  Items Reviewed:  Medications reviewed: yes  Allergies reviewed: yes  Dietary changes reviewed: yes  Referrals reviewed: yes   Functional Questionnaire:   Activities of Daily Living (ADLs):   She states they are independent in the following: ambulation, bathing and hygiene, feeding, continence, grooming, toileting and dressing States they require assistance with the following: none   Any transportation issues/concerns?: no   Any patient concerns? no   Confirmed importance and date/time of follow-up visits scheduled: yes   Confirmed with patient if condition begins to worsen call PCP or go to the ER.  Patient was given the Call-a-Nurse line [phone]: yes Patient was discharged 07/06/15 Patient was discharged to home Patient has an appointment with Dr Kirtland Bouchard 07/08/15 at 11:15

## 2015-07-07 NOTE — Telephone Encounter (Signed)
Dr Kirtland Bouchard sees this patient tomorrow for transitional care management.  Would he like to give verbal orders?

## 2015-07-08 ENCOUNTER — Encounter: Payer: Self-pay | Admitting: Internal Medicine

## 2015-07-08 ENCOUNTER — Ambulatory Visit (INDEPENDENT_AMBULATORY_CARE_PROVIDER_SITE_OTHER): Payer: Medicare Other | Admitting: Internal Medicine

## 2015-07-08 VITALS — BP 130/80 | HR 96 | Temp 98.3°F | Resp 18 | Ht 65.0 in | Wt 103.0 lb

## 2015-07-08 DIAGNOSIS — M353 Polymyalgia rheumatica: Secondary | ICD-10-CM

## 2015-07-08 DIAGNOSIS — R413 Other amnesia: Secondary | ICD-10-CM

## 2015-07-08 DIAGNOSIS — S2232XG Fracture of one rib, left side, subsequent encounter for fracture with delayed healing: Secondary | ICD-10-CM

## 2015-07-08 DIAGNOSIS — R296 Repeated falls: Secondary | ICD-10-CM | POA: Diagnosis not present

## 2015-07-08 DIAGNOSIS — I10 Essential (primary) hypertension: Secondary | ICD-10-CM

## 2015-07-08 NOTE — Progress Notes (Signed)
Pre visit review using our clinic review tool, if applicable. No additional management support is needed unless otherwise documented below in the visit note. 

## 2015-07-08 NOTE — Progress Notes (Signed)
Subjective:    Patient ID: Shirley [name], female    DOB: [birth year], 81 y.o.   MRN: [id number]  HPI Admit date: 07/04/2015 Discharge date: 07/06/2015  Recommendations for Outpatient Follow-up:  1. PCP in 1 week 2. Home health services  Discharge Diagnoses:   Fall   Rib fractures  Multiple rib fractures involving four or more ribs  Essential hypertension  Polymyalgia rheumatica  Mild dementia 81 year old patient who is seen today following a recent hospital discharge.  She was discharged 2 days ago after a fall resulting in multiple left-sided rib fractures.  She has a history of essential hypertension, polymyalgia rheumatica, mild dementia as well as osteoporosis.  Since her discharge she has done remarkably well.  Pain is aggravated by movement, but is fairly modest.  She is accompanied by her daughter Hospital records reviewed  Past Medical History  Diagnosis Date  . Hypertension   . Osteoporosis   . PMR (polymyalgia rheumatica)   . Tobacco abuse   . Glucose intolerance (impaired glucose tolerance)   . Osteoarthritis   . Arthritis     "right shoulder; that's why I had to have it replaced"    History   Social History  . Marital Status: Married    Spouse Name: N/A  . Number of Children: 3  . Years of Education: N/A   Occupational History  . retired    Social History Main Topics  . Smoking status: Current Every Day Smoker -- 0.50 packs/day for 40 years    Types: Cigarettes    Last Attempt to Quit: 12/06/1991  . Smokeless tobacco: Never Used  . Alcohol Use: Yes     Comment: occ  . Drug Use: No  . Sexual Activity: No   Other Topics Concern  . Not on file   Social History Narrative   Retired   Married   Alcohol use-no   Drug use-no   Regular Exercise-yes   Current Smoker          Past Surgical History  Procedure Laterality Date  . Cataract extraction w/ intraocular lens  implant, bilateral      2008   . Posterior fusion lumbar spine   03/28/12  . Total shoulder replacement  06/2011    right  . Shoulder arthroscopy w/ rotator cuff repair  ~ 01/2011    right  . Vaginal hysterectomy  1970's  . Kyphoplasty N/A 02/28/2013    Procedure: KYPHOPLASTY;  Surgeon: Cristi Loron, MD;  Location: [facility];  Service: Neurosurgery;  Laterality: N/A;  Lumbar two Kyphoplasty  . Hip pinning,cannulated Right 10/20/2013    Procedure: CANNULATED HIP PINNING;  Surgeon: Eldred Manges, MD;  Location: [facility];  Service: Orthopedics;  Laterality: Right;    Family History  Problem Relation Age of Onset  . ALS Father   . Hypertension Sister     No Known Allergies  Current Outpatient Prescriptions on File Prior to Visit  Medication Sig Dispense Refill  . citalopram (CELEXA) 10 MG tablet Take 2 tablets daily (Patient taking differently: Take 10 mg by mouth 2 (two) times daily. ) 180 tablet 3  . donepezil (ARICEPT) 10 MG tablet Take 1 tablet (10 mg total) by mouth at bedtime. 90 tablet 3  . metoprolol (LOPRESSOR) 50 MG tablet 1 by mouth each bedtime (Patient taking differently: Take 50 mg by mouth every morning. ) 100 tablet 3  . naproxen (NAPROSYN) 250 MG tablet Take 1 tablet (250 mg total) by mouth 2 (  two) times daily with a meal. For 5 days 10 tablet 0  . polyethylene glycol (MIRALAX / GLYCOLAX) packet Take 17 g by mouth daily as needed for mild constipation. 14 each 0  . traMADol (ULTRAM) 50 MG tablet Take 1 tablet (50 mg total) by mouth every 6 (six) hours as needed. 30 tablet 0   No current facility-administered medications on file prior to visit.    BP 130/80 mmHg  Pulse 96  Temp(Src) 98.3 F (36.8 C) (Oral)  Resp 18  Ht 5\' 5"  (1.651 m)  Wt 103 lb (46.72 kg)  BMI 17.14 kg/m2  SpO2 97%        Review of Systems  Constitutional: Negative.   HENT: Negative for congestion, dental problem, hearing loss, rhinorrhea, sinus pressure, sore throat and tinnitus.   Eyes: Negative for pain, discharge and visual disturbance.    Respiratory: Negative for cough and shortness of breath.   Cardiovascular: Positive for chest pain. Negative for palpitations and leg swelling.  Gastrointestinal: Negative for nausea, vomiting, abdominal pain, diarrhea, constipation, blood in stool and abdominal distention.  Genitourinary: Negative for dysuria, urgency, frequency, hematuria, flank pain, vaginal bleeding, vaginal discharge, difficulty urinating, vaginal pain and pelvic pain.  Musculoskeletal: Positive for gait problem. Negative for joint swelling and arthralgias.  Skin: Negative for rash.  Neurological: Negative for dizziness, syncope, speech difficulty, weakness, numbness and headaches.  Hematological: Negative for adenopathy.  Psychiatric/Behavioral: Positive for decreased concentration. Negative for behavioral problems, dysphoric mood and agitation. The patient is nervous/anxious.        Objective:   Physical Exam  Constitutional: She is oriented to person, place, and time. She appears well-developed and well-nourished.  Able to transfer from a sitting position to the examining table unassisted but with some discomfort  HENT:  Head: Normocephalic.  Right Ear: External ear normal.  Left Ear: External ear normal.  Mouth/Throat: Oropharynx is clear and moist.  Eyes: Conjunctivae and EOM are normal. Pupils are equal, round, and reactive to light.  Neck: Normal range of motion. Neck supple. No thyromegaly present.  Cardiovascular: Normal rate, regular rhythm, normal heart sounds and intact distal pulses.   Pulmonary/Chest: Effort normal and breath sounds normal. She exhibits tenderness.  O2 saturation 97  Abdominal: Soft. Bowel sounds are normal. She exhibits no mass. There is no tenderness.  Musculoskeletal: Normal range of motion.  Lymphadenopathy:    She has no cervical adenopathy.  Neurological: She is alert and oriented to person, place, and time.  Skin: Skin is warm and dry. No rash noted.  Psychiatric: She has a  normal mood and affect. Her behavior is normal.          Assessment & Plan:  Chest wall pain secondary to multiple left-sided rib fractures Essential hypertension, stable Polymyalgia rheumatica, stable Osteoporosis Mild  cognitive impairment  CPX 6 months No change in medications

## 2015-07-08 NOTE — Patient Instructions (Signed)
Call or return to clinic prn if these symptoms worsen or fail to improve as anticipated.  Limit your sodium (Salt) intake  Please check your blood pressure on a regular basis.  If it is consistently greater than 150/90, please make an office appointment.  Return in one year for follow-up

## 2015-07-09 ENCOUNTER — Ambulatory Visit (INDEPENDENT_AMBULATORY_CARE_PROVIDER_SITE_OTHER): Payer: Medicare Other | Admitting: Adult Health

## 2015-07-09 DIAGNOSIS — Z538 Procedure and treatment not carried out for other reasons: Secondary | ICD-10-CM

## 2015-07-10 DIAGNOSIS — R269 Unspecified abnormalities of gait and mobility: Secondary | ICD-10-CM | POA: Diagnosis not present

## 2015-07-10 DIAGNOSIS — Z9181 History of falling: Secondary | ICD-10-CM | POA: Diagnosis not present

## 2015-07-13 NOTE — Progress Notes (Signed)
   Subjective:    Patient ID: Shirley [name], female    DOB: 1934-06-21, 81 y.o.   MRN: [id number]  HPI    Review of Systems     Objective:   Physical Exam        Assessment & Plan:

## 2015-07-14 DIAGNOSIS — R269 Unspecified abnormalities of gait and mobility: Secondary | ICD-10-CM | POA: Diagnosis not present

## 2015-07-14 DIAGNOSIS — Z9181 History of falling: Secondary | ICD-10-CM | POA: Diagnosis not present

## 2015-07-20 ENCOUNTER — Telehealth: Payer: Self-pay | Admitting: Family Medicine

## 2015-07-20 DIAGNOSIS — Z9181 History of falling: Secondary | ICD-10-CM | POA: Diagnosis not present

## 2015-07-20 DIAGNOSIS — R269 Unspecified abnormalities of gait and mobility: Secondary | ICD-10-CM | POA: Diagnosis not present

## 2015-07-20 NOTE — Telephone Encounter (Signed)
FYI:  Pt forgot to take any meds over the weekend.   bp up a little (162/95) heather had her take right away. Hopefull that will help.

## 2015-08-07 ENCOUNTER — Encounter: Payer: Self-pay | Admitting: Neurology

## 2015-08-07 ENCOUNTER — Ambulatory Visit (INDEPENDENT_AMBULATORY_CARE_PROVIDER_SITE_OTHER): Payer: Medicare Other | Admitting: Neurology

## 2015-08-07 DIAGNOSIS — F0391 Unspecified dementia with behavioral disturbance: Secondary | ICD-10-CM | POA: Diagnosis not present

## 2015-08-07 DIAGNOSIS — R269 Unspecified abnormalities of gait and mobility: Secondary | ICD-10-CM | POA: Diagnosis not present

## 2015-08-07 DIAGNOSIS — F03B18 Unspecified dementia, moderate, with other behavioral disturbance: Secondary | ICD-10-CM

## 2015-08-07 DIAGNOSIS — Z9181 History of falling: Secondary | ICD-10-CM | POA: Diagnosis not present

## 2015-08-07 MED ORDER — MEMANTINE HCL 28 X 5 MG & 21 X 10 MG PO TABS: ORAL_TABLET | ORAL | Status: DC

## 2015-08-07 NOTE — Progress Notes (Signed)
NEUROLOGY FOLLOW UP OFFICE NOTE  [name] [id number]  HISTORY OF PRESENT ILLNESS: I had the pleasure of seeing Shirley [name] in follow-up in the neurology clinic on 08/07/2015.  The patient was last seen 3 months ago for mild dementia. She is again accompanied by her son and caregiver who help supplement the history today.  On her last visit in June, MMSE was 26/30. Her son was surprised that she did as well with MMSE at that time. She presents today for an earlier visit due to worsening of memory, her son feels she has gotten a whole lot worse. She repeats herself excessively, sometimes within a few seconds. She fell last month and does not recall how or where it happened, but fractured some ribs. She did home PT one time. She and her husband refuse to leave their house, and now have a caregiver who comes to check on them daily. No difficulties with ADLs, however caregiver reports problems with hygiene, she has noticed her wearing the same clothes for the past 3 days. She was started on Celexa due to report of constant anxiety for her husband. No side effects on medications, she reports feeling fine.   HPI: This is a pleasant 81 yo RH woman who presented with memory changes that she started to notice after she fell in November 2014 and sustained a right femur fracture. She reports that she has "never been someone who can remember things," however over the past few months, she feels that "there are a lot of things I can't pull up." She has always been a note-taker, but reports that it takes her a longer time to go through things in her head now. She states that it is "not bad, just insignificant things," but has her worried. She would occasionally misplace things, but does not get turned around in her home. She lives with her husband and denies being told she repeats things excessively. She occasionally forgets details of conversations. She is in-charge of their financial matters, and does not  have any difficulties with these. She does not forget to take her medications. She has occasional word-finding difficulties, and notices that when she reads, she has to go back to the beginning of a sentence. She is slower to follow instructions. She continues to cook and has not left the stove on. She continues to drive and does not get lost or confused. She has no difficulties performing ADLs. There is no family history of dementia. MOCA score in April 2015 was 24/30. MMSE in January 2016 was 24/30.  I personally reviewed MRI brain done 01/2013 which showed diffuse global volume loss with enlarged ventricles, scattered T2 FLAIR hyperintensities. No acute abnormalities seen.  PAST MEDICAL HISTORY: Past Medical History  Diagnosis Date  . Hypertension   . Osteoporosis   . PMR (polymyalgia rheumatica)   . Tobacco abuse   . Glucose intolerance (impaired glucose tolerance)   . Osteoarthritis   . Arthritis     "right shoulder; that's why I had to have it replaced"    MEDICATIONS: Current Outpatient Prescriptions on File Prior to Visit  Medication Sig Dispense Refill  . citalopram (CELEXA) 10 MG tablet Take 2 tablets daily (Patient taking differently: Take 10 mg by mouth 2 (two) times daily. ) 180 tablet 3  . donepezil (ARICEPT) 10 MG tablet Take 1 tablet (10 mg total) by mouth at bedtime. 90 tablet 3  . metoprolol (LOPRESSOR) 50 MG tablet 1 by mouth each bedtime (Patient taking differently:  Take 50 mg by mouth every morning. ) 100 tablet 3  . naproxen (NAPROSYN) 250 MG tablet Take 1 tablet (250 mg total) by mouth 2 (two) times daily with a meal. For 5 days 10 tablet 0  . polyethylene glycol (MIRALAX / GLYCOLAX) packet Take 17 g by mouth daily as needed for mild constipation. 14 each 0  . traMADol (ULTRAM) 50 MG tablet Take 1 tablet (50 mg total) by mouth every 6 (six) hours as needed. 30 tablet 0   No current facility-administered medications on file prior to visit.    ALLERGIES: No Known  Allergies  FAMILY HISTORY: Family History  Problem Relation Age of Onset  . ALS Father   . Hypertension Sister     SOCIAL HISTORY: Social History   Social History  . Marital Status: Married    Spouse Name: N/A  . Number of Children: 3  . Years of Education: N/A   Occupational History  . retired    Social History Main Topics  . Smoking status: Current Every Day Smoker -- 0.50 packs/day for 40 years    Types: Cigarettes    Last Attempt to Quit: 12/06/1991  . Smokeless tobacco: Never Used  . Alcohol Use: Yes     Comment: occ  . Drug Use: No  . Sexual Activity: No   Other Topics Concern  . Not on file   Social History Narrative   Retired   Married   Alcohol use-no   Drug use-no   Regular Exercise-yes   Current Smoker          REVIEW OF SYSTEMS: Constitutional: No fevers, chills, or sweats, no generalized fatigue, change in appetite Eyes: No visual changes, double vision, eye pain Ear, nose and throat: No hearing loss, ear pain, nasal congestion, sore throat Cardiovascular: No chest pain, palpitations Respiratory:  No shortness of breath at rest or with exertion, wheezes GastrointestinaI: No nausea, vomiting, diarrhea, abdominal pain, fecal incontinence Genitourinary:  No dysuria, urinary retention or frequency Musculoskeletal:  No neck pain, back pain Integumentary: No rash, pruritus, skin lesions Neurological: as above Psychiatric: No depression, insomnia, anxiety Endocrine: No palpitations, fatigue, diaphoresis, mood swings, change in appetite, change in weight, increased thirst Hematologic/Lymphatic:  No anemia, purpura, petechiae. Allergic/Immunologic: no itchy/runny eyes, nasal congestion, recent allergic reactions, rashes  PHYSICAL EXAM: Filed Vitals:   08/07/15 1121  BP: 100/70  Pulse: 66   General: No acute distress Head:  Normocephalic/atraumatic Neck: supple, no paraspinal tenderness, full range of motion Heart:  Regular rate and  rhythm Lungs:  Clear to auscultation bilaterally Back: No paraspinal tenderness Skin/Extremities: No rash, no edema Neurological Exam: alert and oriented to person, place, and time. No aphasia or dysarthria. Fund of knowledge is appropriate.  Recent and remote memory are intact.  Attention and concentration are normal.    Able to name objects and repeat phrases.  MMSE - Mini Mental State Exam 08/07/2015 05/08/2015 12/15/2014  Orientation to time Orientation to Place Registration Attention/ Calculation Recall 0 0 0  Language- name 2 objects Language- repeat Language- follow 3 step command Language- read & follow direction Write a sentence Copy design Total score Cranial nerves: Pupils equal, round, reactive to light.  Fundoscopic exam unremarkable, no papilledema. Extraocular  movements intact with no nystagmus. Visual fields full. Facial sensation intact. No facial asymmetry. Tongue, uvula, palate midline.  Motor: Bulk and tone normal, muscle strength 5/5 throughout with no pronator drift.  Sensation to light touch intact.  No extinction to double simultaneous stimulation.  Deep tendon reflexes 2+ throughout, toes downgoing.  Finger to nose testing intact.  Gait slow and cautious, no ataxia. Romberg negative.  IMPRESSION: This is a pleasant 81 yo RH woman with a history of hypertension who presented with memory loss since early 2015.MMSE today 25/30 (26/30 in June 2016, 24/30 in January 2016). Her MMSE scores have been pretty stable, however her son is concerned about worsening memory in the last 3 months, and presents today with her new caregiver who reports problems with hygiene. Although MMSE indicates mild dementia, history concerning for mild to moderate dementia. She is currently on Aricept, and will start Namenda XR starter pack. Side effects were discussed. Her son is also wondering about Neuropsychological  evaluation, we discussed that this is usually done if there is concern about mood as cause of memory issues, she has already been started on Celexa for anxiety. We discussed that this may further delineate her symptoms but would not necessarily change our current management. We again discussed fall safety, and recommend 24/7 supervision. We discussed th importance of physical exercise and brain stimulation exercises for brain health. She will follow-up in 3 months and knows to call our office for any changes.   Thank you for allowing me to participate in her care.  Please do not hesitate to call for any questions or concerns.  The duration of this appointment visit was 24 minutes of face-to-face time with the patient.  Greater than 50% of this time was spent in counseling, explanation of diagnosis, planning of further management, and coordination of care.   Patrcia Dolly, M.D.   CC: Dr. Tawanna Cooler

## 2015-08-07 NOTE — Patient Instructions (Signed)
1. Start Namenda XR starter pack. After you are done with starter pack, your prescription for Namenda XR  daily will be at pharmacy 2. Continue Aricept  daily 3. Continue Celexa  daily 4. Physical and brain stimulation exercises are important for brain health 5. Continue with 24/7 supervision, fall precautions 6. Follow-up in 3 months

## 2015-08-10 ENCOUNTER — Encounter: Payer: Self-pay | Admitting: Neurology

## 2015-08-10 DIAGNOSIS — F0390 Unspecified dementia without behavioral disturbance: Secondary | ICD-10-CM | POA: Insufficient documentation

## 2015-08-10 DIAGNOSIS — F03B0 Unspecified dementia, moderate, without behavioral disturbance, psychotic disturbance, mood disturbance, and anxiety: Secondary | ICD-10-CM | POA: Insufficient documentation

## 2015-08-10 MED ORDER — MEMANTINE HCL ER 28 MG PO CP24: 28.0000 mg | ORAL_CAPSULE | Freq: Every day | ORAL | Status: DC

## 2015-08-31 ENCOUNTER — Telehealth: Payer: Self-pay | Admitting: Neurology

## 2015-08-31 NOTE — Telephone Encounter (Signed)
Returned call to [employer]. Rx for Namenda XR with refills was sent to patient's pharmacy earlier in the month. I advised him to call the pharmacy.

## 2015-08-31 NOTE — Telephone Encounter (Signed)
Felicia, please send these messages to Tiffany.   Tiff, pls send Rx for Namenda XR  daily with 6 refills to pharm.    Thanks!

## 2015-08-31 NOTE — Telephone Encounter (Signed)
Pt/son Shirley [name]//call for a prescription from the sample med/ [facility]?//call back @ [phone]

## 2015-09-01 ENCOUNTER — Telehealth: Payer: Self-pay | Admitting: Neurology

## 2015-09-01 MED ORDER — MEMANTINE HCL 10 MG PO TABS
10.0000 mg | ORAL_TABLET | Freq: Two times a day (BID) | ORAL | Status: DC
Start: 2015-09-01 — End: 2015-11-12

## 2015-09-01 NOTE — Telephone Encounter (Signed)
Is this Namenda XR? We can try immediate release Namenda, not sure if cost is different. namenda  take 1 tab BID. Thanks

## 2015-09-01 NOTE — Telephone Encounter (Signed)
I spoke with Shirley [name] and also spoke with patient about Rx for Namenda 10 twice a day. Patient is agreeable to this to see if Rx will be cheaper than the XR. I will send in new Rx to patient's pharmacy.

## 2015-09-01 NOTE — Telephone Encounter (Signed)
Do you have any other suggestions?

## 2015-09-01 NOTE — Telephone Encounter (Signed)
Pt daughter in law Inetta Fermo called and states that the patient co pay for the Namenda is 157.00 and patient states that is to much to pay please call TIna at [phone]

## 2015-09-02 ENCOUNTER — Telehealth: Payer: Self-pay | Admitting: Neurology

## 2015-09-02 NOTE — Telephone Encounter (Signed)
Returned call. Left msg on voicmail that Dr. Karel Jarvis doesn't prescribe bp meds, they would need to contact her pcp.

## 2015-09-02 NOTE — Telephone Encounter (Signed)
Pt requesting bp med to be given in am/[phone]

## 2015-09-30 ENCOUNTER — Ambulatory Visit (INDEPENDENT_AMBULATORY_CARE_PROVIDER_SITE_OTHER): Payer: Medicare Other | Admitting: Family Medicine

## 2015-09-30 ENCOUNTER — Encounter: Payer: Self-pay | Admitting: Family Medicine

## 2015-09-30 VITALS — BP 130/90 | Temp 98.1°F | Wt 104.0 lb

## 2015-09-30 DIAGNOSIS — Z23 Encounter for immunization: Secondary | ICD-10-CM | POA: Diagnosis not present

## 2015-09-30 DIAGNOSIS — J4521 Mild intermittent asthma with (acute) exacerbation: Secondary | ICD-10-CM

## 2015-09-30 DIAGNOSIS — F17200 Nicotine dependence, unspecified, uncomplicated: Secondary | ICD-10-CM | POA: Diagnosis not present

## 2015-09-30 DIAGNOSIS — I10 Essential (primary) hypertension: Secondary | ICD-10-CM | POA: Diagnosis not present

## 2015-09-30 MED ORDER — PREDNISONE 20 MG PO TABS: ORAL_TABLET | ORAL | Status: DC

## 2015-09-30 MED ORDER — VARENICLINE TARTRATE 1 MG PO TABS: ORAL_TABLET | ORAL | Status: DC

## 2015-09-30 MED ORDER — METOPROLOL TARTRATE 50 MG PO TABS: ORAL_TABLET | ORAL | Status: DC

## 2015-09-30 NOTE — Patient Instructions (Signed)
Drink lots of water  Stop smoking completely  Prednisone 20 mg......... 2 tabs 3 days, 1 tab 3 days, half a tab 3 days, then a half a tab Monday Wednesday Friday for a two-week taper  Chantix 1 mg........... one half tab daily in the morning

## 2015-09-30 NOTE — Progress Notes (Signed)
   Subjective:    Patient ID: Shirley BealBarbara B [name], female    DOB: 23-Apr-1934, 81 y.o.   MRN: [id number]  HPI  Shirley [name] is a 81 year old married female smoker.......Marland Kitchen. we have worked with her for over 30 years to try to quit smoking with various programs..... She's declined them all..... Who comes in today for evaluation of cough  She is accompanied today by her daughter-in-law who now is the primary caregiver.  Shirley [name] and her husband cannot function independently anymore. They still her in their original home. The daughter-in-law stays with them constantly and give similar medication  She's had no fever earache sore throat.  Review of Systems Review of systems otherwise negative    Objective:   Physical Exam  Well-developed well-nourished female no acute distress vital signs stable she's afebrile HEENT were negative neck was supple no adenopathy pulmonary exam shows decreased breath sounds and wheezing mild bilaterally. No crackles      Assessment & Plan:  COPD with secondary wheezing........ stop smoking completely........ again recommended Chantix........Marland Kitchen. prednisone burst and taper......... Hydromet.... 1/2 teaspoon at bedtime  Return when necessary

## 2015-11-03 DIAGNOSIS — H401132 Primary open-angle glaucoma, bilateral, moderate stage: Secondary | ICD-10-CM | POA: Diagnosis not present

## 2015-11-12 ENCOUNTER — Ambulatory Visit (INDEPENDENT_AMBULATORY_CARE_PROVIDER_SITE_OTHER): Payer: Medicare Other | Admitting: Neurology

## 2015-11-12 ENCOUNTER — Encounter: Payer: Self-pay | Admitting: Neurology

## 2015-11-12 ENCOUNTER — Ambulatory Visit: Payer: Medicare Other | Admitting: Neurology

## 2015-11-12 VITALS — BP 100/70 | HR 66 | Resp 16 | Wt 105.0 lb

## 2015-11-12 DIAGNOSIS — F0391 Unspecified dementia with behavioral disturbance: Secondary | ICD-10-CM | POA: Diagnosis not present

## 2015-11-12 DIAGNOSIS — F03B18 Unspecified dementia, moderate, with other behavioral disturbance: Secondary | ICD-10-CM

## 2015-11-12 MED ORDER — MEMANTINE HCL 10 MG PO TABS: 10.0000 mg | ORAL_TABLET | Freq: Two times a day (BID) | ORAL | Status: DC

## 2015-11-12 MED ORDER — DONEPEZIL HCL 10 MG PO TABS: ORAL_TABLET | ORAL | Status: DC

## 2015-11-12 NOTE — Patient Instructions (Signed)
1. Continue all your medications 2. We will send order for Visiting Nurse to help with medication management 3. Strongly recommend moving into assisted living facility to help with safety and medications 4. No driving 5. Follow-up in 6 months

## 2015-11-12 NOTE — Progress Notes (Addendum)
NEUROLOGY FOLLOW UP OFFICE NOTE  Shirley [name] [id number]  HISTORY OF PRESENT ILLNESS: I had the pleasure of seeing Shirley [name] in follow-up in the neurology clinic on 11/12/2015.  The patient was last seen 3 months ago for mild dementia, MMSE 25/30 in September 2016. She is accompanied by her daughter from [place] who helps supplement the history today. Since her last visit, her daughter expressed concern that she is forgetting to take her medications. Her son comes several nights a week, and his fiancee helps almost on a daily basis, but sees that she had not taken the night dose of medication. They are looking into [facility] to move both parents, but the patient's husband has been resistant. She does not drive. They deny any falls since her last visit.   HPI 03/06/2014: This is a pleasant 81 yo RH woman who presented with memory changes that she started to notice after she fell in November 2014 and sustained a right femur fracture. She reports that she has "never been someone who can remember things," however over the past few months, she feels that "there are a lot of things I can't pull up." She has always been a note-taker, but reports that it takes her a longer time to go through things in her head now. She states that it is "not bad, just insignificant things," but has her worried. She would occasionally misplace things, but does not get turned around in her home. She lives with her husband and denies being told she repeats things excessively. She occasionally forgets details of conversations. She had been in-charge of their financial matters, and denied not have any difficulties with these. She has occasional word-finding difficulties, and notices that when she reads, she has to go back to the beginning of a sentence. She is slower to follow instructions. She continues to cook and has not left the stove on. She continues to drive and does not get lost or confused. She has no  difficulties performing ADLs. There is no family history of dementia. MOCA score in April 2015 was 24/30. MMSE in January 2016 was 24/30, September 2016 25/30.  I personally reviewed MRI brain done 01/2013 which showed diffuse global volume loss with enlarged ventricles, scattered T2 FLAIR hyperintensities. No acute abnormalities seen.  PAST MEDICAL HISTORY: Past Medical History  Diagnosis Date  . Hypertension   . Osteoporosis   . PMR (polymyalgia rheumatica) (HCC)   . Tobacco abuse   . Glucose intolerance (impaired glucose tolerance)   . Osteoarthritis   . Arthritis     "right shoulder; that's why I had to have it replaced"    MEDICATIONS: Current Outpatient Prescriptions on File Prior to Visit  Medication Sig Dispense Refill  . citalopram (CELEXA) 10 MG tablet Take 2 tablets daily (Patient taking differently: Take 10 mg by mouth 2 (two) times daily. ) 180 tablet 3  . donepezil (ARICEPT) 10 MG tablet Take 1 tablet (10 mg total) by mouth at bedtime. 90 tablet 3  . memantine (NAMENDA) 10 MG tablet Take 1 tablet (10 mg total) by mouth 2 (two) times daily. 60 tablet 4  . metoprolol (LOPRESSOR) 50 MG tablet 1 by mouth each bedtime 100 tablet 3   No current facility-administered medications on file prior to visit.    ALLERGIES: No Known Allergies  FAMILY HISTORY: Family History  Problem Relation Age of Onset  . ALS Father   . Hypertension Sister     SOCIAL HISTORY: Social History  Social History  . Marital Status: Married    Spouse Name: N/A  . Number of Children: 3  . Years of Education: N/A   Occupational History  . retired    Social History Main Topics  . Smoking status: Current Some Day Smoker -- 0.50 packs/day for 40 years    Types: Cigarettes    Last Attempt to Quit: 12/06/1991  . Smokeless tobacco: Never Used  . Alcohol Use: 0.0 oz/week    0 Standard drinks or equivalent per week     Comment: occ  . Drug Use: No  . Sexual Activity: No   Other Topics  Concern  . Not on file   Social History Narrative   Retired   Married   Alcohol use-no   Drug use-no   Regular Exercise-yes   Current Smoker          REVIEW OF SYSTEMS: Constitutional: No fevers, chills, or sweats, no generalized fatigue, change in appetite Eyes: No visual changes, double vision, eye pain Ear, nose and throat: No hearing loss, ear pain, nasal congestion, sore throat Cardiovascular: No chest pain, palpitations Respiratory:  No shortness of breath at rest or with exertion, wheezes GastrointestinaI: No nausea, vomiting, diarrhea, abdominal pain, fecal incontinence Genitourinary:  No dysuria, urinary retention or frequency Musculoskeletal:  No neck pain, back pain Integumentary: No rash, pruritus, skin lesions Neurological: as above Psychiatric: No depression, insomnia, anxiety Endocrine: No palpitations, fatigue, diaphoresis, mood swings, change in appetite, change in weight, increased thirst Hematologic/Lymphatic:  No anemia, purpura, petechiae. Allergic/Immunologic: no itchy/runny eyes, nasal congestion, recent allergic reactions, rashes  PHYSICAL EXAM: Filed Vitals:   11/12/15 1137  BP: 100/70  Pulse: 66  Resp: 16   General: No acute distress Head:  Normocephalic/atraumatic Neck: supple, no paraspinal tenderness, full range of motion Heart:  Regular rate and rhythm Lungs:  Clear to auscultation bilaterally Back: No paraspinal tenderness Skin/Extremities: No rash, no edema Neurological Exam: alert and oriented to person, place, and month/season. Year is 2014, day of week Wed (it is Thurs). No aphasia or dysarthria. Fund of knowledge is appropriate.  Remote memory intact.  Attention and concentration are normal.    Able to name objects and repeat phrases. CDT 5/5. MMSE - Mini Mental State Exam 11/12/2015 08/07/2015 05/08/2015  Orientation to time Orientation to Place Registration Attention/ Calculation Recall 0 0 0  Language-  name 2 objects Language- repeat Language- follow 3 step command Language- read & follow direction Write a sentence Copy design Total score Cranial nerves: Pupils equal, round, reactive to light.  Fundoscopic exam unremarkable, no papilledema. Extraocular movements intact with no nystagmus. Visual fields full. Facial sensation intact. No facial asymmetry. Tongue, uvula, palate midline.  Motor: Bulk and tone normal, muscle strength 5/5 throughout with no pronator drift.  Sensation to light touch intact.  No extinction to double simultaneous stimulation.  Deep tendon reflexes 2+ throughout, toes downgoing.  Finger to nose testing intact.  Gait slow and cautious, no ataxia. Romberg negative.  IMPRESSION: This is a pleasant 81 yo RH woman with a history of hypertension who presented with memory loss since early 2015.MMSE today 23/30 (25/30 in September 2016. By history suggestive of mild to moderate dementia. Her family continues to express concern  about progressive worsening of memory, she now forgets to take medications regularly. We again discussed the importance of home safety and taking medications regularly. Her daughter asked about the option of Exelon patch, we discussed that the patient has to remember to replace it daily, similar to taking oral medication daily. I explained the difference of her taking Aricept and Namenda to her daughter, indications for each. Unfortunately Namenda XR taken once daily is too cost-prohibitive, and she is taking Namenda 10mg  BID. She will benefit from having a visiting nurse help with medication management. We again discussed looking into assisted living with her worsening memory and history of frequent falls. She does not drive. Continue all medications. We again discussed th importance of physical exercise and brain stimulation exercises for brain health. She will follow-up in 6 months and knows to call our office  for any changes.   Thank you for allowing me to participate in her care.  Please do not hesitate to call for any questions or concerns.  The duration of this appointment visit was 25 minutes of face-to-face time with the patient.  Greater than 50% of this time was spent in counseling, explanation of diagnosis, planning of further management, and coordination of care.   Patrcia DollyKaren Omri Bertran, M.D.   CC: Dr. Tawanna Coolerodd

## 2015-11-16 ENCOUNTER — Telehealth: Payer: Self-pay | Admitting: Family Medicine

## 2015-11-16 ENCOUNTER — Telehealth: Payer: Self-pay | Admitting: Neurology

## 2015-11-16 NOTE — Telephone Encounter (Signed)
[name] to clarify a diagnosis code for date of service: 10.28.15. Please give him a call concerning this.   [name] ph# [phone] Thank you.

## 2015-11-16 NOTE — Telephone Encounter (Signed)
[name]/ called only to inform that no skilled care needed for pt

## 2015-11-17 ENCOUNTER — Telehealth: Payer: Self-pay | Admitting: Neurology

## 2015-11-17 NOTE — Telephone Encounter (Signed)
Is it okay to give out this information?

## 2015-11-17 NOTE — Telephone Encounter (Signed)
Shirley [name], from [facility]/ [phone]//called to inform that pt insurance is out of network

## 2015-11-17 NOTE — Telephone Encounter (Signed)
I left a message for Shirley [name] to contact the billing office. Thank you.

## 2015-11-17 NOTE — Telephone Encounter (Signed)
He needs to contact our billing office at [phone].

## 2015-11-17 NOTE — Telephone Encounter (Signed)
Noted  

## 2015-11-17 NOTE — Telephone Encounter (Signed)
I called [facility] and spoke with Kenard Gowerrew. He states that they were unable to add patient on to their service because in order for patient to receive med management through them she would have to be started on a new medication. Right now she hasn't been started on anything new she just needs management of the meds she is already been on. I will try another home health agency for the patient.

## 2015-11-18 ENCOUNTER — Telehealth: Payer: Self-pay | Admitting: Family Medicine

## 2015-11-18 NOTE — Telephone Encounter (Signed)
After trying to 3 different home health facilities for the patient for medication management which she wasn't qualified for home health services for [facility]. [facility] didn't take her ins. After speaking with the rep [facility] she informed me that patient had previously been on their service and was d/c'd because they were not able to help her and recommended that patient needed to be in an assisted living facility.   I spoke with Shirley [name] about the above and explained we have used all of our resources for home health and that we weren't able to do anything else in reference to that. He did verbalize good understanding & does agree that his parents need to be in an assisted living facility, but it has been difficult to get them to agree to that.

## 2016-01-14 ENCOUNTER — Encounter: Payer: Self-pay | Admitting: Physical Therapy

## 2016-01-14 NOTE — Therapy (Signed)
[facility] [address] [place], [place], [address] Phone: [phone]   Fax:  [phone]  Patient Details  Name: Shirley [name] MRN: [id number] Date of Birth: Apr 02, 1934 Referring Provider:  Stevie Kern, MD  Encounter Date: 01/14/2016   PHYSICAL THERAPY DISCHARGE SUMMARY  Visits from Start of Care: 3  Current functional level related to goals / functional outcomes: Unknown as patient did not return.    Remaining deficits: Unknown   Education / Equipment: HEP  Plan:                                                    Patient goals were not met. Patient is being discharged due to not returning since the last visit.  ?????       Courteny Egler PT, DPT 01/14/2016, 1:59 PM  [facility] [address] [place], [place], [address] Phone: [phone]   Fax:  [phone]

## 2016-01-20 ENCOUNTER — Encounter: Payer: Self-pay | Admitting: Neurology

## 2016-03-04 ENCOUNTER — Ambulatory Visit (INDEPENDENT_AMBULATORY_CARE_PROVIDER_SITE_OTHER): Payer: Medicare Other | Admitting: Adult Health

## 2016-03-04 ENCOUNTER — Encounter: Payer: Self-pay | Admitting: Adult Health

## 2016-03-04 VITALS — BP 122/62 | Temp 98.0°F | Wt 110.8 lb

## 2016-03-04 DIAGNOSIS — M1990 Unspecified osteoarthritis, unspecified site: Secondary | ICD-10-CM | POA: Diagnosis not present

## 2016-03-04 MED ORDER — MELOXICAM 7.5 MG PO TABS: 7.5000 mg | ORAL_TABLET | Freq: Every day | ORAL | Status: DC

## 2016-03-04 NOTE — Progress Notes (Signed)
Subjective:    Patient ID: Shirley [name], female    DOB: 02-Aug-1934, 82 y.o.   MRN: [id number]  HPI 82 year old patient of Dr. Tawanna Cooler, who presents with her son to the office today for generalized joint pain. She reports that she is more sore in the morning, and then when she is up moving around ( which is not a lot) the pain gets better. The pain is getting worse over the last 6-9 months. She does take Advil which helps but does not last long.    She has a past medical history of Hypertension; Osteoporosis; PMR (polymyalgia rheumatica) (HCC); Tobacco abuse; Glucose intolerance (impaired glucose tolerance); Osteoarthritis; and Arthritis.    Review of Systems  Constitutional: Negative.   Respiratory: Negative.   Cardiovascular: Negative.   Musculoskeletal: Positive for arthralgias.  All other systems reviewed and are negative.  Past Medical History  Diagnosis Date  . Hypertension   . Osteoporosis   . PMR (polymyalgia rheumatica) (HCC)   . Tobacco abuse   . Glucose intolerance (impaired glucose tolerance)   . Osteoarthritis   . Arthritis     "right shoulder; that's why I had to have it replaced"    Social History   Social History  . Marital Status: Married    Spouse Name: N/A  . Number of Children: 3  . Years of Education: N/A   Occupational History  . retired    Social History Main Topics  . Smoking status: Current Some Day Smoker -- 0.50 packs/day for 40 years    Types: Cigarettes    Last Attempt to Quit: 12/06/1991  . Smokeless tobacco: Never Used  . Alcohol Use: 0.0 oz/week    0 Standard drinks or equivalent per week     Comment: occ  . Drug Use: No  . Sexual Activity: No   Other Topics Concern  . Not on file   Social History Narrative   Retired   Married   Alcohol use-no   Drug use-no   Regular Exercise-yes   Current Smoker          Past Surgical History  Procedure Laterality Date  . Cataract extraction w/ intraocular lens  implant, bilateral       2008   . Posterior fusion lumbar spine  03/28/12  . Total shoulder replacement  06/2011    right  . Shoulder arthroscopy w/ rotator cuff repair  ~ 01/2011    right  . Vaginal hysterectomy  1970's  . Kyphoplasty N/A 02/28/2013    Procedure: KYPHOPLASTY;  Surgeon: Cristi Loron, MD;  Location: [facility];  Service: Neurosurgery;  Laterality: N/A;  Lumbar two Kyphoplasty  . Hip pinning,cannulated Right 10/20/2013    Procedure: CANNULATED HIP PINNING;  Surgeon: Eldred Manges, MD;  Location: [facility];  Service: Orthopedics;  Laterality: Right;    Family History  Problem Relation Age of Onset  . ALS Father   . Hypertension Sister     No Known Allergies  Current Outpatient Prescriptions on File Prior to Visit  Medication Sig Dispense Refill  . citalopram (CELEXA) 10 MG tablet Take 2 tablets daily (Patient taking differently: Take 10 mg by mouth 2 (two) times daily. ) 180 tablet 3  . donepezil (ARICEPT) 10 MG tablet Take 1 tablet daily 90 tablet 3  . memantine (NAMENDA) 10 MG tablet Take 1 tablet (10 mg total) by mouth 2 (two) times daily. 180 tablet 3  . metoprolol (LOPRESSOR) 50 MG tablet 1  by mouth each bedtime 100 tablet 3   No current facility-administered medications on file prior to visit.    BP 122/62 mmHg  Temp(Src) 98 F (36.7 C) (Oral)  Wt 110 lb 12.8 oz (50.259 kg)        Objective:   Physical Exam  Constitutional: She is oriented to person, place, and time. She appears well-developed and well-nourished. No distress.  Cardiovascular: Normal rate, regular rhythm, normal heart sounds and intact distal pulses.  Exam reveals no gallop and no friction rub.   No murmur heard. Pulmonary/Chest: Effort normal and breath sounds normal. No respiratory distress. She has no wheezes. She has no rales. She exhibits no tenderness.  Musculoskeletal: Normal range of motion. She exhibits no edema or tenderness.  Neurological: She is alert and oriented to person, place, and time.    Skin: Skin is warm and dry. No rash noted. She is not diaphoretic. No erythema. No pallor.  Psychiatric: She has a normal mood and affect. Her behavior is normal. Judgment and thought content normal.  Nursing note and vitals reviewed.     Assessment & Plan:  1. Osteoarthritis, unspecified osteoarthritis type, unspecified site - Tylenol Arthritis and/or sports creams - Keep active and move around as much as possible.  - meloxicam (MOBIC) 7.5 MG tablet; Take 1 tablet (7.5 mg total) by mouth daily.  Dispense: 30 tablet; Refill: 3 - Follow up if no improvement.

## 2016-03-04 NOTE — Patient Instructions (Addendum)
It was great meeting you today.   I would recommend that you use Tylenol Arthritis for your pain. You can also use muscle rubs.  If Tylenol Arthritis does not control your pain well enough, then you can start using prescription Mobic.   Please let me know if you need anything.     Osteoarthritis Osteoarthritis is a disease that causes soreness and inflammation of a joint. It occurs when the cartilage at the affected joint wears down. Cartilage acts as a cushion, covering the ends of bones where they meet to form a joint. Osteoarthritis is the most common form of arthritis. It often occurs in older people. The joints affected most often by this condition include those in the:  Ends of the fingers.  Thumbs.  Neck.  Lower back.  Knees.  Hips. CAUSES  Over time, the cartilage that covers the ends of bones begins to wear away. This causes bone to rub on bone, producing pain and stiffness in the affected joints.  RISK FACTORS Certain factors can increase your chances of having osteoarthritis, including:  Older age.  Excessive body weight.  Overuse of joints.  Previous joint injury. SIGNS AND SYMPTOMS   Pain, swelling, and stiffness in the joint.  Over time, the joint may lose its normal shape.  Small deposits of bone (osteophytes) may grow on the edges of the joint.  Bits of bone or cartilage can break off and float inside the joint space. This may cause more pain and damage. DIAGNOSIS  Your health care provider will do a physical exam and ask about your symptoms. Various tests may be ordered, such as:  X-rays of the affected joint.  Blood tests to rule out other types of arthritis. Additional tests may be used to diagnose your condition. TREATMENT  Goals of treatment are to control pain and improve joint function. Treatment plans may include:  A prescribed exercise program that allows for rest and joint relief.  A weight control plan.  Pain relief techniques,  such as:  Properly applied heat and cold.  Electric pulses delivered to nerve endings under the skin (transcutaneous electrical nerve stimulation [TENS]).  Massage.  Certain nutritional supplements.  Medicines to control pain, such as:  Acetaminophen.  Nonsteroidal anti-inflammatory drugs (NSAIDs), such as naproxen.  Narcotic or central-acting agents, such as tramadol.  Corticosteroids. These can be given orally or as an injection.  Surgery to reposition the bones and relieve pain (osteotomy) or to remove loose pieces of bone and cartilage. Joint replacement may be needed in advanced states of osteoarthritis. HOME CARE INSTRUCTIONS   Take medicines only as directed by your health care provider.  Maintain a healthy weight. Follow your health care provider's instructions for weight control. This may include dietary instructions.  Exercise as directed. Your health care provider can recommend specific types of exercise. These may include:  Strengthening exercises. These are done to strengthen the muscles that support joints affected by arthritis. They can be performed with weights or with exercise bands to add resistance.  Aerobic activities. These are exercises, such as brisk walking or low-impact aerobics, that get your heart pumping.  Range-of-motion activities. These keep your joints limber.  Balance and agility exercises. These help you maintain daily living skills.  Rest your affected joints as directed by your health care provider.  Keep all follow-up visits as directed by your health care provider. SEEK MEDICAL CARE IF:   Your skin turns red.  You develop a rash in addition to your joint  pain.  You have worsening joint pain.  You have a fever along with joint or muscle aches. SEEK IMMEDIATE MEDICAL CARE IF:  You have a significant loss of weight or appetite.  You have night sweats. FOR MORE INFORMATION   National Institute of Arthritis and Musculoskeletal  and Skin Diseases: www.niams.http://www.myers.net/  [employer] on Aging: https://walker.com/  American College of Rheumatology: www.rheumatology.org   This information is not intended to replace advice given to you by your health care provider. Make sure you discuss any questions you have with your health care provider.   Document Released: 11/21/2005 Document Revised: 12/12/2014 Document Reviewed: 07/29/2013 Elsevier Interactive Patient Education [employer]

## 2016-04-04 ENCOUNTER — Telehealth: Payer: Self-pay | Admitting: *Deleted

## 2016-04-04 NOTE — Telephone Encounter (Signed)
[facility]  Please call Tawanna Coolerodd (son) to schedule.

## 2016-05-13 ENCOUNTER — Ambulatory Visit: Payer: Medicare Other | Admitting: Neurology

## 2016-07-11 ENCOUNTER — Encounter: Payer: Medicare Other | Admitting: Family Medicine

## 2016-08-01 ENCOUNTER — Other Ambulatory Visit: Payer: Self-pay | Admitting: Neurology

## 2016-08-01 DIAGNOSIS — F419 Anxiety disorder, unspecified: Secondary | ICD-10-CM

## 2016-08-01 NOTE — Telephone Encounter (Signed)
Last OV 11/2015... Last filled on 05/2015... Okay to refill?

## 2016-08-01 NOTE — Telephone Encounter (Signed)
PT called and needs a prior authorization for her medication refill/Dawn  CB# [phone]

## 2016-08-01 NOTE — Telephone Encounter (Signed)
Ok to refill, thanks

## 2016-08-02 ENCOUNTER — Other Ambulatory Visit: Payer: Self-pay | Admitting: *Deleted

## 2016-08-02 DIAGNOSIS — F419 Anxiety disorder, unspecified: Secondary | ICD-10-CM

## 2016-08-02 MED ORDER — CITALOPRAM HYDROBROMIDE 10 MG PO TABS: ORAL_TABLET | ORAL | 3 refills | Status: DC

## 2016-08-18 ENCOUNTER — Encounter: Payer: Self-pay | Admitting: Family Medicine

## 2016-08-18 ENCOUNTER — Telehealth: Payer: Self-pay | Admitting: Family Medicine

## 2016-08-18 ENCOUNTER — Ambulatory Visit (INDEPENDENT_AMBULATORY_CARE_PROVIDER_SITE_OTHER): Payer: Medicare Other | Admitting: Family Medicine

## 2016-08-18 VITALS — BP 142/80 | HR 81 | Temp 97.9°F | Wt 113.1 lb

## 2016-08-18 DIAGNOSIS — M1990 Unspecified osteoarthritis, unspecified site: Secondary | ICD-10-CM

## 2016-08-18 DIAGNOSIS — F03B0 Unspecified dementia, moderate, without behavioral disturbance, psychotic disturbance, mood disturbance, and anxiety: Secondary | ICD-10-CM

## 2016-08-18 DIAGNOSIS — F0390 Unspecified dementia without behavioral disturbance: Secondary | ICD-10-CM | POA: Diagnosis not present

## 2016-08-18 NOTE — Progress Notes (Signed)
Subjective:    Patient ID: Shirley [name], female    DOB: 16-Oct-1934, 82 y.o.   MRN: [id number]  HPI  [name] is an 82 year old female who presents today with her daughter in law for concerns regarding osteoarthritis pain, depression and her memory. She is followed by neurology for moderate dementia with behavioral disturbance. Memory loss is reported since approximately early 2015. She has a history of not remembering her medications and she has assistance with medications from her daughter in law. She and her daughter in law were advised by neurology to explore options for assisted living due to her worsening memory and she has had a history of falls. She is interested in exploring options but reports that her husband is not interested at this time. She and her husband do not drive. Current medication for depression includes citalopram 20 mg daily.  She denies depression or suicidal ideation.  She denies any issues with sleep or appetite. She reports osteoarthritis pain that has been present for "quite some time" however for the past 5 days her discomfort has increased. She states that pain is most noticeable when getting up out of bed. She reports stiffness that improves with ambulation. She is currently on meloxicam 7.5 mg daily that provides limited to moderate benefit. She states that this discomfort is in many areas and is not localized to her hips. Aggravating factor is sitting or lying down for extended periods of time. Alleviating factor is noted with movement. She has not used Tylenol for several weeks.  She denies any appetite changes. She denies disturbances in her sleep Wt Readings from Last 3 Encounters:  08/18/16 113 lb 1.6 oz (51.3 kg)  03/04/16 110 lb 12.8 oz (50.3 kg)  11/12/15 105 lb (47.6 kg)   She has a PMH of HTN, osteoporosis, PMR (polymyalgia rheumatica), tobacco abuse, glucose intolerance (impaired glucose tolerance, and osteoarthritis.  Review of Systems    Constitutional: Negative for chills, fatigue and fever.  HENT: Negative for postnasal drip and rhinorrhea.   Respiratory: Negative for cough, shortness of breath and wheezing.   Cardiovascular: Negative for chest pain and palpitations.  Gastrointestinal: Negative for abdominal pain, diarrhea, nausea and vomiting.  Genitourinary: Negative for dysuria, frequency and hematuria.  Musculoskeletal: Positive for arthralgias.  Skin: Negative for rash.  Neurological: Negative for dizziness, light-headedness and headaches.  Psychiatric/Behavioral:       Denies depressed or anxious mood   Past Medical History:  Diagnosis Date  . Arthritis    "right shoulder; that's why I had to have it replaced"  . Glucose intolerance (impaired glucose tolerance)   . Hypertension   . Osteoarthritis   . Osteoporosis   . PMR (polymyalgia rheumatica) (HCC)   . Tobacco abuse      Social History   Social History  . Marital status: Married    Spouse name: N/A  . Number of children: 3  . Years of education: N/A   Occupational History  . retired    Social History Main Topics  . Smoking status: Current Some Day Smoker    Packs/day: 0.50    Years: 40.00    Types: Cigarettes    Last attempt to quit: 12/06/1991  . Smokeless tobacco: Never Used  . Alcohol use 0.0 oz/week     Comment: occ  . Drug use: No  . Sexual activity: No   Other Topics Concern  . Not on file   Social History Narrative   Retired   Married  Alcohol use-no   Drug use-no   Regular Exercise-yes   Current Smoker          Past Surgical History:  Procedure Laterality Date  . CATARACT EXTRACTION W/ INTRAOCULAR LENS  IMPLANT, BILATERAL     2008   . HIP PINNING,CANNULATED Right 10/20/2013   Procedure: CANNULATED HIP PINNING;  Surgeon: Eldred MangesMark C Yates, MD;  Location: [facility];  Service: Orthopedics;  Laterality: Right;  . KYPHOPLASTY N/A 02/28/2013   Procedure: KYPHOPLASTY;  Surgeon: Cristi LoronJeffrey D Jenkins, MD;  Location: [facility];   Service: Neurosurgery;  Laterality: N/A;  Lumbar two Kyphoplasty  . POSTERIOR FUSION LUMBAR SPINE  03/28/12  . SHOULDER ARTHROSCOPY W/ ROTATOR CUFF REPAIR  ~ 01/2011   right  . TOTAL SHOULDER REPLACEMENT  06/2011   right  . VAGINAL HYSTERECTOMY  1970's    Family History  Problem Relation Age of Onset  . ALS Father   . Hypertension Sister     No Known Allergies  Current Outpatient Prescriptions on File Prior to Visit  Medication Sig Dispense Refill  . citalopram (CELEXA) 10 MG tablet Take 2 tablets daily 180 tablet 3  . donepezil (ARICEPT) 10 MG tablet Take 1 tablet daily 90 tablet 3  . meloxicam (MOBIC) 7.5 MG tablet Take 1 tablet (7.5 mg total) by mouth daily. 30 tablet 3  . memantine (NAMENDA) 10 MG tablet Take 1 tablet (10 mg total) by mouth 2 (two) times daily. 180 tablet 3  . metoprolol (LOPRESSOR) 50 MG tablet 1 by mouth each bedtime 100 tablet 3   No current facility-administered medications on file prior to visit.     BP (!) 142/80   Pulse 81   Temp 97.9 F (36.6 C) (Oral)   Wt 113 lb 1.6 oz (51.3 kg)   SpO2 97%   BMI 18.82 kg/m       Objective:   Physical Exam  Constitutional:  Thin female who appears optimally nourished.   HENT:  Right Ear: Tympanic membrane normal.  Left Ear: Tympanic membrane normal.  Nose: No rhinorrhea. Right sinus exhibits no maxillary sinus tenderness and no frontal sinus tenderness. Left sinus exhibits no maxillary sinus tenderness and no frontal sinus tenderness.  Mouth/Throat: Mucous membranes are normal. No oropharyngeal exudate or posterior oropharyngeal erythema.  Eyes: Pupils are equal, round, and reactive to light. No scleral icterus.  Neck: Neck supple.  Cardiovascular: Normal rate, regular rhythm and intact distal pulses.   Pulmonary/Chest: Effort normal and breath sounds normal. She has no wheezes. She has no rales.  Abdominal: Soft. Bowel sounds are normal. There is no tenderness.  Musculoskeletal: Normal range of motion.  She exhibits no edema or tenderness.  Lymphadenopathy:    She has no cervical adenopathy.  Neurological: She is alert. She has normal strength. No sensory deficit. Gait normal.  Cognitive decline is present and remains unchanged per report of her daughter in law. She is followed by neurology and has an appointment scheduled next month.  Skin: Skin is warm and dry. No rash noted.      Assessment & Plan:  1. Osteoarthritis, unspecified osteoarthritis type, unspecified site Patient and daughter in law state that she has not tried Tylenol on a regular basis for her osteoarthritis pain recently. No change in symptoms and she reports stiffness when getting up from her bed or chair however she is able to ambulate after "working out the stiffness". Advise Tylenol to be administered as previously prescribed and used for relief of discomfort  with meloxicam. Discussed the importance of taking meloxicam with food and then she can try Tylenol at lunch and before bedtime. Further advised her to keep moving and keep her joints warm.  She will return in 3 days for lab work as scheduled for upcoming physical. Deferred lab work since patient was not fasting Physical scheduled in less than 2 weeks with Dr. Tawanna Cooler  2. Moderate dementia, without behavioral disturbance Continue aricept and namenda as prescribed by neurology and follow up with neurology as scheduled. Continue with memory exercises as discussed with neurologist. Recommended exploration of assisted living with patient and her daughter in law who were in agreement. They will discuss this at her upcoming physical.  Roddie Mc, FNP-C

## 2016-08-18 NOTE — Progress Notes (Signed)
Pre visit review using our clinic review tool, if applicable. No additional management support is needed unless otherwise documented below in the visit note. 

## 2016-08-18 NOTE — Telephone Encounter (Signed)
Noted Pt is being seen today by Amil AmenJulia.

## 2016-08-18 NOTE — Telephone Encounter (Signed)
Patient Name: Donaciano EvaBARBARA Carnell DOB: Jul 22, 1934 Initial Comment Caller states mother-in-law is not getting out of bed, seems depressed, having arthritis pain. Nurse Assessment Nurse: Roma KayserForsythe, RN, Santina Evansatherine Date/Time (Eastern Time): 08/18/2016 1:14:11 PM Confirm and document reason for call. If symptomatic, describe symptoms. You must click the next button to save text entered. ---caller states she is primary care giver and has seen a decline in pt. 5 days eating small amounts and then back to bed, isnt showering, got some depression going on, says she just wants to give up. caller states has physical set up for a couple weeks but doesnt want to wait that long, also does not want to take her to see anyone else. Has the patient traveled out of the country within the last 30 days? ---Not Applicable Does the patient have any new or worsening symptoms? ---Yes Will a triage be completed? ---Yes Related visit to physician within the last 2 weeks? ---No Does the PT have any chronic conditions? (i.e. diabetes, asthma, etc.) ---Yes List chronic conditions. ---dementia Is this a behavioral health or substance abuse call? ---No Guidelines Guideline Title Affirmed Question Affirmed Notes Depression [1] Depression AND [2] unable to do any of normal activities (e.g., self care, school, work; in comparison to baseline). Final Disposition User Go to ED Now Roma KayserForsythe, RN, Santina Evansatherine Comments left message with dr todd assistant. Referrals REFERRED TO PCP OFFICE Disagree/Comply: Disagree Disagree/Comply Reason: Disagree with instructions

## 2016-08-18 NOTE — Telephone Encounter (Signed)
FYI

## 2016-08-18 NOTE — Patient Instructions (Signed)
Please return for lab work in 3 days as scheduled and follow up with Dr. Tawanna Coolerodd as scheduled within the next 2 weeks. It is important to drink plenty of water enough to keep your urine pale yellow or clear.  Advise use of Tylenol arthritis with current regimen and keep moving which helps with joint discomfort.  Follow up with neurology as scheduled.

## 2016-08-23 ENCOUNTER — Other Ambulatory Visit (INDEPENDENT_AMBULATORY_CARE_PROVIDER_SITE_OTHER): Payer: Medicare Other

## 2016-08-23 DIAGNOSIS — Z0000 Encounter for general adult medical examination without abnormal findings: Secondary | ICD-10-CM

## 2016-08-23 LAB — CBC WITH DIFFERENTIAL/PLATELET
BASOS PCT: 0.6 % (ref 0.0–3.0)
Basophils Absolute: 0.1 10*3/uL (ref 0.0–0.1)
EOS PCT: 4.9 % (ref 0.0–5.0)
Eosinophils Absolute: 0.5 10*3/uL (ref 0.0–0.7)
HCT: 43.2 % (ref 36.0–46.0)
Hemoglobin: 14.4 g/dL (ref 12.0–15.0)
LYMPHS ABS: 1.9 10*3/uL (ref 0.7–4.0)
Lymphocytes Relative: 20.4 % (ref 12.0–46.0)
MCHC: 33.4 g/dL (ref 30.0–36.0)
MCV: 92.4 fl (ref 78.0–100.0)
MONO ABS: 0.6 10*3/uL (ref 0.1–1.0)
Monocytes Relative: 6.9 % (ref 3.0–12.0)
NEUTROS PCT: 67.2 % (ref 43.0–77.0)
Neutro Abs: 6.2 10*3/uL (ref 1.4–7.7)
Platelets: 204 10*3/uL (ref 150.0–400.0)
RBC: 4.68 Mil/uL (ref 3.87–5.11)
RDW: 13.8 % (ref 11.5–15.5)
WBC: 9.3 10*3/uL (ref 4.0–10.5)

## 2016-08-23 LAB — LIPID PANEL
CHOLESTEROL: 152 mg/dL (ref 0–200)
HDL: 33.6 mg/dL — AB (ref >39.00)
LDL CALC: 88 mg/dL (ref 0–99)
NonHDL: 118.08
TRIGLYCERIDES: 148 mg/dL (ref 0.0–149.0)
Total CHOL/HDL Ratio: 5
VLDL: 29.6 mg/dL (ref 0.0–40.0)

## 2016-08-23 LAB — POC URINALSYSI DIPSTICK (AUTOMATED)
Bilirubin, UA: NEGATIVE
Glucose, UA: NEGATIVE
Ketones, UA: NEGATIVE
LEUKOCYTES UA: NEGATIVE
NITRITE UA: NEGATIVE
PH UA: 6
Spec Grav, UA: 1.02
UROBILINOGEN UA: 0.2

## 2016-08-23 LAB — HEPATIC FUNCTION PANEL
ALT: 13 U/L (ref 0–35)
AST: 22 U/L (ref 0–37)
Albumin: 4.1 g/dL (ref 3.5–5.2)
Alkaline Phosphatase: 70 U/L (ref 39–117)
BILIRUBIN TOTAL: 0.5 mg/dL (ref 0.2–1.2)
Bilirubin, Direct: 0.1 mg/dL (ref 0.0–0.3)
Total Protein: 7.3 g/dL (ref 6.0–8.3)

## 2016-08-23 LAB — BASIC METABOLIC PANEL
BUN: 27 mg/dL — ABNORMAL HIGH (ref 6–23)
CO2: 33 meq/L — AB (ref 19–32)
Calcium: 9.3 mg/dL (ref 8.4–10.5)
Chloride: 100 mEq/L (ref 96–112)
Creatinine, Ser: 0.78 mg/dL (ref 0.40–1.20)
GFR: 75.44 mL/min (ref >60.00)
Glucose, Bld: 94 mg/dL (ref 70–99)
Potassium: 4.3 mEq/L (ref 3.5–5.1)
SODIUM: 137 meq/L (ref 135–145)

## 2016-08-23 LAB — TSH: TSH: 1.41 u[IU]/mL (ref 0.35–4.50)

## 2016-08-30 ENCOUNTER — Encounter: Payer: Self-pay | Admitting: Family Medicine

## 2016-08-30 ENCOUNTER — Ambulatory Visit (INDEPENDENT_AMBULATORY_CARE_PROVIDER_SITE_OTHER): Payer: Medicare Other | Admitting: Family Medicine

## 2016-08-30 VITALS — BP 138/90 | HR 69 | Temp 97.9°F | Wt 113.6 lb

## 2016-08-30 DIAGNOSIS — Z23 Encounter for immunization: Secondary | ICD-10-CM

## 2016-08-30 DIAGNOSIS — Z0000 Encounter for general adult medical examination without abnormal findings: Secondary | ICD-10-CM | POA: Diagnosis not present

## 2016-08-30 DIAGNOSIS — F0391 Unspecified dementia with behavioral disturbance: Secondary | ICD-10-CM

## 2016-08-30 DIAGNOSIS — M1990 Unspecified osteoarthritis, unspecified site: Secondary | ICD-10-CM | POA: Diagnosis not present

## 2016-08-30 DIAGNOSIS — I10 Essential (primary) hypertension: Secondary | ICD-10-CM | POA: Diagnosis not present

## 2016-08-30 DIAGNOSIS — F03B18 Unspecified dementia, moderate, with other behavioral disturbance: Secondary | ICD-10-CM

## 2016-08-30 DIAGNOSIS — F419 Anxiety disorder, unspecified: Secondary | ICD-10-CM

## 2016-08-30 MED ORDER — MELOXICAM 7.5 MG PO TABS: 7.5000 mg | ORAL_TABLET | Freq: Every day | ORAL | 3 refills | Status: DC

## 2016-08-30 MED ORDER — DONEPEZIL HCL 10 MG PO TABS: ORAL_TABLET | ORAL | 3 refills | Status: DC

## 2016-08-30 MED ORDER — CITALOPRAM HYDROBROMIDE 10 MG PO TABS: ORAL_TABLET | ORAL | 3 refills | Status: DC

## 2016-08-30 MED ORDER — METOPROLOL TARTRATE 50 MG PO TABS: ORAL_TABLET | ORAL | 3 refills | Status: DC

## 2016-08-30 MED ORDER — PREDNISONE 20 MG PO TABS: ORAL_TABLET | ORAL | 1 refills | Status: DC

## 2016-08-30 MED ORDER — MEMANTINE HCL 10 MG PO TABS: 10.0000 mg | ORAL_TABLET | Freq: Two times a day (BID) | ORAL | 3 refills | Status: DC

## 2016-08-30 NOTE — Progress Notes (Signed)
Shirley [name] is an 82 year old married female smoker....... 7 cigarettes daily and will not quit,,,,, who comes in today for general physical examination copy by her daughter-in-law. The family now takes care of Shirley [name] and her husband. They come to the house daily and check on him. They refused to go to extended living center.  She gets routine eye care, dental care, no need for colonoscopy or mammography.  She takes 20 mg of Celexa at bedtime, Aricept and Namenda for dementia, mobile except 0.5 mg for arthritis and Lopressor 50 mg for hypertension. BP 130/90.  Overall they are functioning on the margin. She continues to smoke in the house. I explained that that is now off limits he must go outside to smoke. Also showed episode a week ago which didn't get a because her arthritis is bothering her and the Mobic wasn't helping. She came in saw Raynelle FanningJulie there is nothing acute at that juncture. I recommend a short course of prednisone when necessary  The biggest issue is long-term care. Again they refused to go to a long-term care facility  Vaccinations flu shot and Pneumovax given today  Physical examination vital signs stable she's afebrile HEENT were negative neck was supple no adenopathy thyroid normal cardiopulmonary exam normal except for distant heart sounds and COPD and decreased breast down some COPD abdominal exam negative extremities normal skin normal peripheral pulses barely palpable in her feet.  #1 history of mild depression..... Continue Celexa 20 mg  #2 dementia.....Marland Kitchen. continue Aricept and Namenda  #3 continue low back 7.5 mg for a........Marland Kitchen. burst and prednisone taper  #5 hypertension....... continue Lopressor

## 2016-08-30 NOTE — Patient Instructions (Signed)
Continue current medications  Prednisone 10 mg......... 2 tabs 3 days or until she feels significant decrease in joint pain then taper as follows.......... one for 3, I have to 3, then half a tab Monday Wednesday Friday for a two-week taper  No smoking in the house,,,,,,,,,,,,, she must go outside

## 2016-08-30 NOTE — Progress Notes (Signed)
Pre visit review using our clinic review tool, if applicable. No additional management support is needed unless otherwise documented below in the visit note. 

## 2016-09-13 ENCOUNTER — Encounter: Payer: Self-pay | Admitting: Neurology

## 2016-09-13 ENCOUNTER — Ambulatory Visit (INDEPENDENT_AMBULATORY_CARE_PROVIDER_SITE_OTHER): Payer: Medicare Other | Admitting: Neurology

## 2016-09-13 VITALS — BP 124/82 | HR 76 | Temp 97.6°F | Ht 65.0 in | Wt 117.5 lb

## 2016-09-13 DIAGNOSIS — F0391 Unspecified dementia with behavioral disturbance: Secondary | ICD-10-CM | POA: Diagnosis not present

## 2016-09-13 DIAGNOSIS — F03B18 Unspecified dementia, moderate, with other behavioral disturbance: Secondary | ICD-10-CM
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
## 2016-09-13 NOTE — Patient Instructions (Signed)
1. Continue all your medications 2. Recommend contacting the [facility] for further guidance on guardianship and home safety. Their number is [phone].  3. Follow-up in 6 months, call for any changes

## 2016-09-15 ENCOUNTER — Encounter: Payer: Self-pay | Admitting: Neurology

## 2016-12-09 ENCOUNTER — Telehealth: Payer: Self-pay | Admitting: Family Medicine

## 2016-12-09 ENCOUNTER — Telehealth: Payer: Self-pay | Admitting: Neurology

## 2016-12-09 ENCOUNTER — Other Ambulatory Visit: Payer: Self-pay

## 2016-12-09 DIAGNOSIS — F0391 Unspecified dementia with behavioral disturbance: Secondary | ICD-10-CM

## 2016-12-09 DIAGNOSIS — M1990 Unspecified osteoarthritis, unspecified site: Secondary | ICD-10-CM

## 2016-12-09 DIAGNOSIS — F419 Anxiety disorder, unspecified: Secondary | ICD-10-CM

## 2016-12-09 DIAGNOSIS — F03B18 Unspecified dementia, moderate, with other behavioral disturbance: Secondary | ICD-10-CM

## 2016-12-09 DIAGNOSIS — I10 Essential (primary) hypertension: Secondary | ICD-10-CM

## 2016-12-09 MED ORDER — MEMANTINE HCL 10 MG PO TABS: 10.0000 mg | ORAL_TABLET | Freq: Two times a day (BID) | ORAL | 3 refills | Status: DC

## 2016-12-09 MED ORDER — DONEPEZIL HCL 10 MG PO TABS: ORAL_TABLET | ORAL | 3 refills | Status: DC

## 2016-12-09 MED ORDER — CITALOPRAM HYDROBROMIDE 10 MG PO TABS: ORAL_TABLET | ORAL | 3 refills | Status: DC

## 2016-12-09 MED ORDER — MELOXICAM 7.5 MG PO TABS: 7.5000 mg | ORAL_TABLET | Freq: Every day | ORAL | 3 refills | Status: DC

## 2016-12-09 MED ORDER — METOPROLOL TARTRATE 50 MG PO TABS: ORAL_TABLET | ORAL | 3 refills | Status: DC

## 2016-12-09 NOTE — Telephone Encounter (Signed)
[facility] mail order for medication  °Fax number [phone] °Patient Shirley [name] daughter in law [phone] called and states that they are changing everything over to the mail order listed above please fax the RX information to [employer]  °

## 2016-12-09 NOTE — Telephone Encounter (Signed)
Went to send medications to new pharmacy but noticed they were last prescribed by PCP. Please advise.

## 2016-12-09 NOTE — Telephone Encounter (Signed)
Ok to send rx for Aricept and Namenda, thanks

## 2016-12-09 NOTE — Telephone Encounter (Signed)
RX for [employer] and Aricept sent.

## 2016-12-09 NOTE — Telephone Encounter (Signed)
Pt has new pharm [employer] home delivery and needs new rxs citalopram 10 mg, meloxicam 7.5 mg and metoprolol #90 w/refillls fax to [phone]

## 2016-12-09 NOTE — Telephone Encounter (Signed)
Sent refill into pharmacy also changed pharmacy.

## 2016-12-27 ENCOUNTER — Other Ambulatory Visit: Payer: Self-pay | Admitting: Emergency Medicine

## 2016-12-27 DIAGNOSIS — F419 Anxiety disorder, unspecified: Secondary | ICD-10-CM

## 2016-12-27 DIAGNOSIS — I10 Essential (primary) hypertension: Secondary | ICD-10-CM

## 2016-12-27 DIAGNOSIS — M1990 Unspecified osteoarthritis, unspecified site: Secondary | ICD-10-CM

## 2016-12-27 MED ORDER — CITALOPRAM HYDROBROMIDE 10 MG PO TABS: ORAL_TABLET | ORAL | 3 refills | Status: DC

## 2016-12-27 MED ORDER — METOPROLOL TARTRATE 50 MG PO TABS: ORAL_TABLET | ORAL | 3 refills | Status: DC

## 2016-12-27 MED ORDER — MELOXICAM 7.5 MG PO TABS: 7.5000 mg | ORAL_TABLET | Freq: Every day | ORAL | 3 refills | Status: DC

## 2016-12-27 NOTE — Telephone Encounter (Signed)
Refill has been sent in to new [facility]

## 2017-01-02 ENCOUNTER — Other Ambulatory Visit: Payer: Self-pay | Admitting: Emergency Medicine

## 2017-01-03 ENCOUNTER — Telehealth: Payer: Self-pay

## 2017-01-03 NOTE — Telephone Encounter (Signed)
Received PA request for [facility]ram from [facility]. PA submitted & is pending. Key: [id number]

## 2017-01-05 NOTE — Telephone Encounter (Signed)
PA approved, patient uses mail order, so no need to fax authorization.

## 2017-04-04 ENCOUNTER — Encounter: Payer: Self-pay | Admitting: Neurology

## 2017-04-04 ENCOUNTER — Ambulatory Visit (INDEPENDENT_AMBULATORY_CARE_PROVIDER_SITE_OTHER): Payer: Medicare PPO | Admitting: Neurology

## 2017-04-04 VITALS — BP 128/76 | HR 69 | Temp 97.8°F | Ht 65.0 in

## 2017-04-04 DIAGNOSIS — F0391 Unspecified dementia with behavioral disturbance: Secondary | ICD-10-CM

## 2017-04-04 DIAGNOSIS — F03B18 Unspecified dementia, moderate, with other behavioral disturbance: Secondary | ICD-10-CM

## 2017-04-04 NOTE — Patient Instructions (Signed)
1. Continue all your medications 2. Continue home safety, fall safety precautions 3. Follow-up in 6 months, call for any changes
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
## 2017-05-16 ENCOUNTER — Telehealth: Payer: Self-pay | Admitting: Neurology

## 2017-05-16 NOTE — Telephone Encounter (Signed)
Spoke with Inetta Fermoina, pt's daughter-in-law.  She says that pt husband has purchased Lipogen through a magazine add and was curious if it would cause any problems.  I searched Lipogen on [employer] - it falls under the vitamin, mineral, multivitamin categories.  Let Inetta Fermoina know that I did not see it being harmful, however I did not see it being helpful either.  She is concerned because pt's husband is not helping pt remember to take her prescribed medications, but is insistent that she take the Lipogen.  Stated that she will work with pt son and 2 daughters to insure that pt is taking prescribed medications properly.

## 2017-05-16 NOTE — Telephone Encounter (Signed)
Caller: Shirley [name]  Urgent? Yes  Reason for the call: Shirley [name] called regarding her Mother in law and Father in [place]. Her Father in law is a patient of Dr. Allena KatzPatel and Shirley MccreedyBarbara is one of Dr. Rosalyn [name]'s. He was trying to buy her a memory medication off TV. The daughter in law is unsure if he ordered them. Not sure if he will try to give them to her or if it is a good idea. Please Advise. Thanks

## 2017-09-18 ENCOUNTER — Telehealth: Payer: Self-pay | Admitting: Family Medicine

## 2017-09-18 NOTE — Telephone Encounter (Signed)
Pt has been schedule to see Dr Tawanna Cooler tomorrow at 3 pm for a follow up from [facility].
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
## 2017-09-19 ENCOUNTER — Ambulatory Visit: Payer: Medicare PPO | Admitting: Family Medicine

## 2017-09-19 ENCOUNTER — Ambulatory Visit (INDEPENDENT_AMBULATORY_CARE_PROVIDER_SITE_OTHER): Payer: Medicare PPO | Admitting: Internal Medicine

## 2017-09-19 ENCOUNTER — Encounter: Payer: Self-pay | Admitting: Internal Medicine

## 2017-09-19 VITALS — BP 136/82 | HR 68 | Temp 98.4°F | Ht 65.0 in | Wt 123.4 lb

## 2017-09-19 DIAGNOSIS — Z23 Encounter for immunization: Secondary | ICD-10-CM

## 2017-09-19 DIAGNOSIS — F0391 Unspecified dementia with behavioral disturbance: Secondary | ICD-10-CM

## 2017-09-19 DIAGNOSIS — S40812S Abrasion of left upper arm, sequela: Secondary | ICD-10-CM

## 2017-09-19 DIAGNOSIS — F03B18 Unspecified dementia, moderate, with other behavioral disturbance: Secondary | ICD-10-CM

## 2017-09-19 DIAGNOSIS — R413 Other amnesia: Secondary | ICD-10-CM
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
## 2017-09-19 NOTE — Patient Instructions (Addendum)
Abrasion An abrasion is a cut or scrape on the outer surface of your skin. An abrasion does not extend through all of the layers of your skin. It is important to care for your abrasion properly to prevent infection. What are the causes? Most abrasions are caused by falling on or gliding across the ground or another surface. When your skin rubs on something, the outer and inner layer of skin rubs off. What are the signs or symptoms? A cut or scrape is the main symptom of this condition. The scrape may be bleeding, or it may appear red or pink. If there was an associated fall, there may be an underlying bruise. How is this diagnosed? An abrasion is diagnosed with a physical exam. How is this treated? Treatment for this condition depends on how large and deep the abrasion is. Usually, your abrasion will be cleaned with water and mild soap. This removes any dirt or debris that may be stuck. An antibiotic ointment may be applied to the abrasion to help prevent infection. A bandage (dressing) may be placed on the abrasion to keep it clean. You may also need a tetanus shot. Follow these instructions at home: Medicines   Take or apply medicines only as directed by your health care provider.  If you were prescribed an antibiotic ointment, finish all of it even if you start to feel better. Wound care   Clean the wound with mild soap and water 2-3 times per day or as directed by your health care provider. Pat your wound dry with a clean towel. Do not rub it.  There are many different ways to close and cover a wound. Follow instructions from your health care provider about:  Wound care.  Dressing changes and removal.  Check your wound every day for signs of infection. Watch for:  Redness, swelling, or pain.  Fluid, blood, or pus. General instructions    Keep the dressing dry as directed by your health care provider. Do not take baths, swim, use a hot tub, or do anything that would put your  wound underwater until your health care provider approves.  If there is swelling, raise (elevate) the injured area above the level of your heart while you are sitting or lying down.  Keep all follow-up visits as directed by your health care provider. This is important. Contact a health care provider if:  You received a tetanus shot and you have swelling, severe pain, redness, or bleeding at the injection site.  Your pain is not controlled with medicine.  You have increased redness, swelling, or pain at the site of your wound. Get help right away if:  You have a red streak going away from your wound.  You have a fever.  You have fluid, blood, or pus coming from your wound.  You notice a bad smell coming from your wound or your dressing. This information is not intended to replace advice given to you by your health care provider. Make sure you discuss any questions you have with your health care provider. Document Released: 08/31/2005 Document Revised: 07/22/2016 Document Reviewed: 11/19/2014 Elsevier Interactive Patient Education  2017 [employer]  

## 2017-09-20 ENCOUNTER — Ambulatory Visit: Payer: Medicare PPO | Admitting: Family Medicine

## 2017-10-09 ENCOUNTER — Ambulatory Visit: Payer: Self-pay | Admitting: Neurology

## 2017-10-09 ENCOUNTER — Encounter: Payer: Self-pay | Admitting: Neurology

## 2017-10-09 VITALS — BP 138/86 | HR 70 | Ht 65.0 in | Wt 110.0 lb

## 2017-10-09 DIAGNOSIS — F0391 Unspecified dementia with behavioral disturbance: Secondary | ICD-10-CM

## 2017-10-09 DIAGNOSIS — F03B18 Unspecified dementia, moderate, with other behavioral disturbance: Secondary | ICD-10-CM

## 2017-10-09 NOTE — Patient Instructions (Addendum)
1. Continue all your medications 2. Your safety at home is of UTMOST importance, you need 24/7 supervision 3. Follow-up in 6 months, call for changes  FALL PRECAUTIONS: Be cautious when walking. Scan the area for obstacles that may increase the risk of trips and falls. When getting up in the mornings, sit up at the edge of the bed for a few minutes before getting out of bed. Consider elevating the bed at the head end to avoid drop of blood pressure when getting up. Walk always in a well-lit room (use night lights in the walls). Avoid area rugs or power cords from appliances in the middle of the walkways. Use a walker or a cane if necessary and consider physical therapy for balance exercise. Get your eyesight checked regularly.  FINANCIAL OVERSIGHT: Supervision, especially oversight when making financial decisions or transactions is also recommended.  HOME SAFETY: Consider the safety of the kitchen when operating appliances like stoves, microwave oven, and blender. Consider having supervision and share cooking responsibilities until no longer able to participate in those. Accidents with firearms and other hazards in the house should be identified and addressed as well.  DRIVING: Regarding driving, in patients with progressive memory problems, driving will be impaired. We advise to have someone else do the driving if trouble finding directions or if minor accidents are reported. Independent driving assessment is available to determine safety of driving.  ABILITY TO BE LEFT ALONE: If patient is unable to contact 911 operator, consider using LifeLine, or when the need is there, arrange for someone to stay with patients. Smoking is a fire hazard, consider supervision or cessation. Risk of wandering should be assessed by caregiver and if detected at any point, supervision and safe proof recommendations should be instituted.  MEDICATION SUPERVISION: Inability to self-administer medication needs to be  constantly addressed. Implement a mechanism to ensure safe administration of the medications.  RECOMMENDATIONS FOR ALL PATIENTS WITH MEMORY PROBLEMS: 1. Continue to exercise (Recommend 30 minutes of walking everyday, or 3 hours every week) 2. Increase social interactions - continue going to [place] and enjoy social gatherings with friends and family 3. Eat healthy, avoid fried foods and eat more fruits and vegetables 4. Maintain adequate blood pressure, blood sugar, and blood cholesterol level. Reducing the risk of stroke and cardiovascular disease also helps promoting better memory. 5. Avoid stressful situations. Live a simple life and avoid aggravations. Organize your time and prepare for the next day in anticipation. 6. Sleep well, avoid any interruptions of sleep and avoid any distractions in the bedroom that may interfere with adequate sleep quality 7. Avoid sugar, avoid sweets as there is a strong link between excessive sugar intake, diabetes, and cognitive impairment We discussed the Mediterranean diet, which has been shown to help patients reduce the risk of progressive memory disorders and reduces cardiovascular risk. This includes eating fish, eat fruits and green leafy vegetables, nuts like almonds and hazelnuts, walnuts, and also use olive oil. Avoid fast foods and fried foods as much as possible. Avoid sweets and sugar as sugar use has been linked to worsening of memory function.  There is always a concern of gradual progression of memory problems. If this is the case, then we may need to adjust level of care according to patient needs. Support, both to the patient and caregiver, should then be put into place.
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
## 2017-10-16 ENCOUNTER — Other Ambulatory Visit: Payer: Self-pay | Admitting: Family Medicine

## 2017-10-16 ENCOUNTER — Other Ambulatory Visit: Payer: Self-pay | Admitting: Neurology

## 2017-10-16 DIAGNOSIS — F03B18 Unspecified dementia, moderate, with other behavioral disturbance: Secondary | ICD-10-CM

## 2017-10-16 DIAGNOSIS — F0391 Unspecified dementia with behavioral disturbance: Secondary | ICD-10-CM

## 2017-10-16 DIAGNOSIS — I10 Essential (primary) hypertension: Secondary | ICD-10-CM

## 2017-10-16 DIAGNOSIS — M1990 Unspecified osteoarthritis, unspecified site: Secondary | ICD-10-CM

## 2017-11-28 ENCOUNTER — Other Ambulatory Visit: Payer: Self-pay | Admitting: Neurology

## 2017-11-28 DIAGNOSIS — F0391 Unspecified dementia with behavioral disturbance: Secondary | ICD-10-CM

## 2017-11-28 DIAGNOSIS — F03B18 Unspecified dementia, moderate, with other behavioral disturbance: Secondary | ICD-10-CM

## 2018-02-12 ENCOUNTER — Other Ambulatory Visit: Payer: Self-pay | Admitting: Family Medicine

## 2018-02-12 DIAGNOSIS — F419 Anxiety disorder, unspecified: Secondary | ICD-10-CM

## 2018-02-13 NOTE — Telephone Encounter (Signed)
Pt needs to schedule an appointment for furthe refills

## 2018-04-16 ENCOUNTER — Other Ambulatory Visit: Payer: Self-pay | Admitting: Family Medicine

## 2018-04-16 DIAGNOSIS — F419 Anxiety disorder, unspecified: Secondary | ICD-10-CM

## 2018-04-17 ENCOUNTER — Ambulatory Visit: Payer: Medicare PPO | Admitting: Neurology

## 2018-04-23 ENCOUNTER — Ambulatory Visit: Payer: Self-pay

## 2018-04-23 NOTE — Telephone Encounter (Signed)
Speaking with husband about patient. She has a pimple-like red, itchy rash on her left forearm and larger red bumps/rash on her mid back and near her front waist. First noticed the rash about 2 weeks ago.  He stated she is scratching at bumps she can reach. The scratched bumps have clear fluid coming from some of them. She does not complain of pain, only itches. He has applied hydrocortisone which seems to help some. Reports she has not had fever or been sick, recently. Answer Assessment - Initial Assessment Questions 1.) CALLER DIAGNOSIS: "What do you think is causing the rash?" (e.g., Chickenpox, Hives, Impetigo, Athlete's Foot, etc.) Does not know. Noticed red, pimple-like rash on her back and left arm about 2 weeks ago.  2.) LOCATION:  "Is it widespread or localized?" On her back and Left arm.  3.) NEW MEDICATIONS: "Are you taking any new medicine?" None.  Answer Assessment - Initial Assessment Questions 1. APPEARANCE of RASH: "Describe the rash."      Red small raised bumps. 2. LOCATION: "Where is the rash located?"      On her mid back and left arm 3. NUMBER: "How many spots are there?"      On her left forearm, less than 20 bumps.More larger red bumps on her back and around the front of her waist.  4. SIZE: "How big are the spots?" (Inches, centimeters or compare to size of a coin)      Small pimple sized on her arm, but larger bumps on back.  5. ONSET: "When did the rash start?"      Approximately 2 weeks ago noticed.  6. ITCHING: "Does the rash itch?" If so, ask: "How bad is the itch?"  (Scale 1-10; or mild, moderate, severe)     Itches. She has dementia and is scratching at it. Draining clear fluid from bumps.  7. PAIN: "Does the rash hurt?" If so, ask: "How bad is the pain?"  (Scale 1-10; or mild, moderate, severe)    Itches a lot. 8. OTHER SYMPTOMS: "Do you have any other symptoms?" (e.g., fever)     no 9. PREGNANCY: "Is there any chance you are pregnant?" "When was your last  menstrual period?"     no  Protocols used: RASH - GUIDELINE SELECTION-A-AH, RASH OR REDNESS - LOCALIZED-A-AH

## 2018-04-24 ENCOUNTER — Ambulatory Visit: Payer: Medicare PPO | Admitting: Adult Health

## 2018-04-24 DIAGNOSIS — Z0289 Encounter for other administrative examinations: Secondary | ICD-10-CM

## 2018-04-25 ENCOUNTER — Ambulatory Visit: Payer: Medicare PPO | Admitting: Family Medicine

## 2018-04-25 ENCOUNTER — Encounter: Payer: Self-pay | Admitting: Family Medicine

## 2018-04-25 VITALS — BP 140/84 | HR 82 | Temp 97.9°F | Wt 120.5 lb

## 2018-04-25 DIAGNOSIS — L853 Xerosis cutis: Secondary | ICD-10-CM

## 2018-04-25 MED ORDER — TRIAMCINOLONE ACETONIDE 0.1 % EX CREA: TOPICAL_CREAM | CUTANEOUS | 1 refills | Status: AC

## 2018-04-25 MED ORDER — METHYLPREDNISOLONE ACETATE 80 MG/ML IJ SUSP
80.0000 mg | Freq: Once | INTRAMUSCULAR | Status: AC
  Administered 2018-04-25: 80 mg via INTRAMUSCULAR

## 2018-04-25 NOTE — Progress Notes (Signed)
  Subjective:     Patient ID: Shirley [name], female   DOB: Sep 10, 1934, 84 y.o.   MRN: [id number]  HPI Patient has history of dementia so history from patient is very limited. She is here accompanied by her son. She has had about a week of some fairly severe pruritus involving her upper extremities and trunk. Her husband also has some dementia and has no rash. No pets. No change of soaps or detergents. They tried some type of moisturizing lotion without much improvement. Pruritus has been severe at times. She has some excoriations. No recent change of medication. No known food allergies.  Past Medical History:  Diagnosis Date  . Arthritis    "right shoulder; that's why I had to have it replaced"  . Glucose intolerance (impaired glucose tolerance)   . Hypertension   . Osteoarthritis   . Osteoporosis   . PMR (polymyalgia rheumatica) (HCC)   . Tobacco abuse    Past Surgical History:  Procedure Laterality Date  . CATARACT EXTRACTION W/ INTRAOCULAR LENS  IMPLANT, BILATERAL     2008   . HIP PINNING,CANNULATED Right 10/20/2013   Procedure: CANNULATED HIP PINNING;  Surgeon: Eldred Manges, MD;  Location: [facility];  Service: Orthopedics;  Laterality: Right;  . KYPHOPLASTY N/A 02/28/2013   Procedure: KYPHOPLASTY;  Surgeon: Cristi Loron, MD;  Location: [facility];  Service: Neurosurgery;  Laterality: N/A;  Lumbar two Kyphoplasty  . POSTERIOR FUSION LUMBAR SPINE  03/28/12  . SHOULDER ARTHROSCOPY W/ ROTATOR CUFF REPAIR  ~ 01/2011   right  . TOTAL SHOULDER REPLACEMENT  06/2011   right  . VAGINAL HYSTERECTOMY  1970's    reports that she has been smoking cigarettes.  She has a 20.00 pack-year smoking history. She has never used smokeless tobacco. She reports that she drinks alcohol. She reports that she does not use drugs. family history includes ALS in her father; Hypertension in her sister. No Known Allergies   Review of Systems  Constitutional: Negative for fever.  Respiratory: Negative for  shortness of breath.   Skin: Positive for rash.  Psychiatric/Behavioral: Negative for agitation.       Objective:   Physical Exam  Constitutional: She appears well-developed and well-nourished.  Cardiovascular: Normal rate.  Pulmonary/Chest: Effort normal and breath sounds normal.  Skin: Skin is dry. Rash noted.  Patient has multiple excoriations on her back and forearms bilaterally. No pustules. No cellulitis changes. Generally dry skin       Assessment:     Dry skin dermatitis    Plan:     -Avoid antihistamines with her age and history of dementia -She has fairly extensive involvement and we recommended going ahead with Depo-Medrol 80 mg IM. -Consider Aveeno soap -Triamcinolone 0.1% cream compound 1:1 with Eucerin and use twice daily as needed -Avoid scratching as much as possible though this will be difficult because of her dementia  Kristian Covey MD [facility]

## 2018-06-20 ENCOUNTER — Ambulatory Visit: Payer: Medicare PPO | Admitting: Family Medicine

## 2018-06-20 ENCOUNTER — Encounter: Payer: Self-pay | Admitting: Family Medicine

## 2018-06-20 VITALS — BP 124/76 | HR 71 | Temp 98.6°F | Wt 121.2 lb

## 2018-06-20 DIAGNOSIS — F0391 Unspecified dementia with behavioral disturbance: Secondary | ICD-10-CM

## 2018-06-20 DIAGNOSIS — F03B18 Unspecified dementia, moderate, with other behavioral disturbance: Secondary | ICD-10-CM

## 2018-06-20 DIAGNOSIS — R21 Rash and other nonspecific skin eruption: Secondary | ICD-10-CM

## 2018-06-20 MED ORDER — BETAMETHASONE DIPROPIONATE 0.05 % EX CREA: TOPICAL_CREAM | Freq: Two times a day (BID) | CUTANEOUS | 2 refills | Status: DC

## 2018-06-20 NOTE — Patient Instructions (Signed)
We will get home health set up.

## 2018-06-20 NOTE — Progress Notes (Signed)
Subjective:     Patient ID: Shirley BealBarbara B Botts, female   DOB: 1934/08/12, 84 y.o.   MRN: [id number]  HPI Patient has advanced dementia. She is accompanied today by her husband and also one of her daughters [place]. She was seen here recently with skin rash which we felt was more of a dry skin dermatitis. She was given Depo-Medrol 80 mg and 1:1 compounded Eucerin and triamcinolone 0.1% cream. Daughter has been applying for the past couple days but apparently she had not been getting her medication until recently. Patient is too confused to apply this herself.  Daughter thinks forearm rash has improved past couple of days.  She continues to scratch frequently. Family has tried [employer] soap.  Pt is not bathing regularly.   needing assistance with ADLs.  She now has some palmar involvement bilaterally. Very scaly nonpustular rash involving the palms. No clear exacerbating factors.  Past Medical History:  Diagnosis Date  . Arthritis    "right shoulder; that's why I had to have it replaced"  . Glucose intolerance (impaired glucose tolerance)   . Hypertension   . Osteoarthritis   . Osteoporosis   . PMR (polymyalgia rheumatica) (HCC)   . Tobacco abuse    Past Surgical History:  Procedure Laterality Date  . CATARACT EXTRACTION W/ INTRAOCULAR LENS  IMPLANT, BILATERAL     2008   . HIP PINNING,CANNULATED Right 10/20/2013   Procedure: CANNULATED HIP PINNING;  Surgeon: Eldred MangesMark C Yates, MD;  Location: [facility];  Service: Orthopedics;  Laterality: Right;  . KYPHOPLASTY N/A 02/28/2013   Procedure: KYPHOPLASTY;  Surgeon: Cristi LoronJeffrey D Jenkins, MD;  Location: [facility];  Service: Neurosurgery;  Laterality: N/A;  Lumbar two Kyphoplasty  . POSTERIOR FUSION LUMBAR SPINE  03/28/12  . SHOULDER ARTHROSCOPY W/ ROTATOR CUFF REPAIR  ~ 01/2011   right  . TOTAL SHOULDER REPLACEMENT  06/2011   right  . VAGINAL HYSTERECTOMY  1970's    reports that she quit smoking about 26 years ago. Her  smoking use included cigarettes. She has a 20.00 pack-year smoking history. She has never used smokeless tobacco. She reports that she drinks alcohol. She reports that she does not use drugs. family history includes ALS in her father; Hypertension in her sister. No Known Allergies   Review of Systems  Constitutional: Negative for chills and fever.  Skin: Positive for rash.       Objective:   Physical Exam  Constitutional: She appears well-developed and well-nourished.  Cardiovascular: Normal rate and regular rhythm.  Pulmonary/Chest: Effort normal and breath sounds normal.  Skin: Rash noted.  Patient has multiple excoriations especially on her lower legs and ankles as well as forearms. She has fairly thick scaly rash on both palms. No pustules. No vesicles. She has some scattered excoriations across her back       Assessment:     #1 diffuse pruritic rash involving extremities and trunk. Question dry skin dermatitis. She does not have distribution to suggest likely scabies and husband has no rash whatsoever which makes this less likely  #2 Thick eczematous rash involving palms of both hands. No evidence for pustular psoriasis    Plan:     -Diprolene 0.05% cream twice daily to hand rash -Continue with triamcinolone and Eucerin compounded 1:1 over rest of body where rash present. -Set up home health nursing assessment to assist with medications including application of topical therapeutics -Recommended family touch base in 2-3 weeks if not improving  Eulas Post MD [facility] at [facility]

## 2018-06-22 LAB — FUNGAL STAIN
FUNGAL SMEAR: NONE SEEN
MICRO NUMBER: 90846827
SPECIMEN QUALITY: ADEQUATE

## 2018-06-25 ENCOUNTER — Telehealth: Payer: Self-pay | Admitting: Family Medicine

## 2018-06-25 NOTE — Telephone Encounter (Signed)
I have reached out to [facility] for help.

## 2018-06-25 NOTE — Telephone Encounter (Signed)
Copied from CRM [id number]. Topic: General - Other >> Jun 25, 2018 11:33 AM Debroah LoopLander, Lumin L wrote: Reason for CRM: Patient's daughter Zella BallRobin calling because the home health aid or skilled nursing never came out to the home to assist the patient. Pt of [place] but saw Burchette last. Please advise.

## 2018-06-28 ENCOUNTER — Telehealth: Payer: Self-pay | Admitting: Family Medicine

## 2018-06-28 NOTE — Telephone Encounter (Signed)
Copied from CRM [phone]. Topic: Quick Communication - See Telephone Encounter >> Jun 28, 2018  1:49 PM Terisa Starraylor, Brittany L wrote: CRM for notification. See Telephone encounter for: 06/28/18.  Kandee Keenory, RN with [facility] called for orders to see the patient for nursing ::  2 times a week for 2 weeks 1 time a week for 7 weeks  Call back @ [phone]

## 2018-06-29 NOTE — Telephone Encounter (Signed)
This is Dr Tawanna Coolerodd pt, you saw pt last on 06/20/2018 for rash, [facility] is requesting for verbal orders to see pt for nursing, please Advise if ok to give orders. Thank you

## 2018-06-29 NOTE — Telephone Encounter (Signed)
ok 

## 2018-07-02 NOTE — Telephone Encounter (Signed)
[facility] with the verbal orders below per Dr Caryl NeverBurchette.

## 2018-07-05 ENCOUNTER — Telehealth: Payer: Self-pay | Admitting: Family Medicine

## 2018-07-05 NOTE — Telephone Encounter (Signed)
Copied from CRM [id number]. Topic: Quick Communication - See Telephone Encounter >> Jul 05, 2018  4:23 PM Burchel, Abbi R wrote: CRM for notification. See Telephone encounter for: 07/05/18.   Boneta LucksJenny [facility] is requesting v/o for speech therapy 1*2wk.  Boneta [phone]-  [phone]

## 2018-07-09 NOTE — Telephone Encounter (Signed)
[facility] checking status

## 2018-07-09 NOTE — Telephone Encounter (Signed)
This is Dr Tawanna Coolerodd pt please advise if ok for verbal order for speech therapy.

## 2018-07-09 NOTE — Telephone Encounter (Signed)
Okay for speech therapy.

## 2018-07-10 NOTE — Telephone Encounter (Signed)
[facility] left a detail message on the verbal approval for speech Therapy for pt

## 2018-07-18 NOTE — Telephone Encounter (Signed)
Spoke with Bon[facility]Jenny with [facility] confirmed that she got the verbal orders for pt Speech Therapy.

## 2018-07-25 ENCOUNTER — Encounter: Payer: Self-pay | Admitting: Family Medicine

## 2018-07-25 ENCOUNTER — Ambulatory Visit: Payer: Medicare PPO | Admitting: Family Medicine

## 2018-07-25 VITALS — BP 120/62 | HR 64 | Temp 97.9°F | Wt 116.8 lb

## 2018-07-25 DIAGNOSIS — L309 Dermatitis, unspecified: Secondary | ICD-10-CM

## 2018-07-25 DIAGNOSIS — R32 Unspecified urinary incontinence: Secondary | ICD-10-CM | POA: Diagnosis not present

## 2018-07-25 DIAGNOSIS — Z022 Encounter for examination for admission to residential institution: Secondary | ICD-10-CM

## 2018-07-25 DIAGNOSIS — Z593 Problems related to living in residential institution: Secondary | ICD-10-CM

## 2018-07-25 DIAGNOSIS — I10 Essential (primary) hypertension: Secondary | ICD-10-CM | POA: Diagnosis not present

## 2018-07-25 DIAGNOSIS — R634 Abnormal weight loss: Secondary | ICD-10-CM

## 2018-07-25 DIAGNOSIS — R296 Repeated falls: Secondary | ICD-10-CM

## 2018-07-25 MED ORDER — METOPROLOL TARTRATE 50 MG PO TABS: 25.0000 mg | ORAL_TABLET | Freq: Every day | ORAL | 3 refills | Status: DC

## 2018-07-25 NOTE — Progress Notes (Signed)
Shirley [name] DOB: 10/22/1934 Encounter date: 07/25/2018  This is a 84 y.o. female who presents with Chief Complaint  Patient presents with  . office visit    needs tb skin test,     History of present illness: Dementia first evaluated about 4 years ago and seeing neurology since that time. Multiple falls and steady decline of cognitive ability, self care, memory.   Here with daughter today to get paperwork completed for transition to assisted living facility.  Family feels that this is been needed for some time, but patient's husband was reluctant to let her go to a facility.  He is not able to take care of her from a medical standpoint.  Memory does continue to worsen.  Shirley [name] has been trying to leave house to go to [facility]. This has worsened in last couple of months. Might have been going on longer, but husband may not have told kids. Has been completely out of house and wandering in streets in past.  Has even had falls outside when she is wandered outside the home without husband's knowledge.  Husband is now ok with her going to assisted living facility.  Patient's son has been completing paperwork for this process.  They do not have a move-in date at this point.  Paperwork was not brought to an appointment today, but daughter states that they need an FL2 form completed.  Shirley [name] states that she is feeling ok. Sometimes complains of arthritis pain.  No other concerns at this time.  Currently memory issues are significant. No short term retention. Knows who some children are; sometimes gets confused with who others are.   Hard to take care of self at home. Can't shower independently. Not remembering when she showered last. Husband unable to convince her to shower when due. Not cleansing self properly when in shower. Can get dressed on own.   Able to get up and down from chair and in and out of bed ok.   Last fall was 3 weeks ago. Usually falls are with walking in the house.    Not doing any food preparation. Has help from husband and children. She will get food out of fridge when hungry. Not having issues with swallowing or feeding self. Daughter states she has not had issues with swallowing; even though speech therapy consult was ordered. They had ordered home nursing just to help with bathing, but other evaluation occurred.  She does seem to have a good appetite.  No issues with aggression or self harm.  Mood has been happy and relaxed.  Anxiety has seemed to go away.  No problems taking medications if she is told to do so. Hasn't always been compliant with topical creams given for previous rash.  Daughter is not certain that patient's husband has been applying creams as directed.  Much better with group of people - very social. Family is very supportive.   No seizure history.   Vision is good. Hearing is a little strained but ok in conversation.   Somewhat bent over with walking. Daughter states it has been this way for awhile.   Has difficulty with making it to bathroom on time. Bowel movements are regular. Usually makes it to bathroom in time for stools.   Sleeps well at night. Not getting up through night.  Not wandering at night.      No Known Allergies Current Meds  Medication Sig  . betamethasone dipropionate (DIPROLENE) 0.05 % cream Apply topically 2 (two) times daily.  Marland Kitchen  citalopram (CELEXA) 10 MG tablet TAKE 2 TABLETS EVERY DAY  . donepezil (ARICEPT) 10 MG tablet TAKE 1 TABLET EVERY DAY  . meloxicam (MOBIC) 7.5 MG tablet TAKE 1 TABLET EVERY DAY  . memantine (NAMENDA) 10 MG tablet TAKE 1 TABLET TWICE DAILY  . metoprolol tartrate (LOPRESSOR) 50 MG tablet Take 0.5 tablets (25 mg total) by mouth at bedtime. TAKE 1 TABLET AT BEDTIME  . triamcinolone cream (KENALOG) 0.1 % Compound 1;1 with Eucerin cream and apply bid prn rash.  . [DISCONTINUED] metoprolol tartrate (LOPRESSOR) 50 MG tablet TAKE 1 TABLET AT BEDTIME    Review of Systems   Constitutional: Positive for activity change (has been less active at home), fatigue (sometimes seems more tired) and unexpected weight change (weight loss of 4-5 pounds in system; uncertain cause). Negative for chills and fever.  HENT: Negative for congestion, ear pain, sinus pain, sore throat, trouble swallowing and voice change.   Eyes: Negative for redness and visual disturbance.  Respiratory: Negative for cough, chest tightness, shortness of breath and wheezing.   Cardiovascular: Negative for chest pain, palpitations and leg swelling.  Gastrointestinal: Negative for abdominal pain, constipation, diarrhea and vomiting.  Genitourinary: Positive for urgency (incontinence when she can't make it to bathroom on time). Negative for difficulty urinating and dysuria.  Musculoskeletal: Positive for arthralgias (arthritis in joints and back does bother her on occasion. ) and back pain.  Neurological: Positive for weakness (feels weaker than she used to; nonspecific) and light-headedness (occasionally). Negative for seizures, syncope and headaches.  Psychiatric/Behavioral: Positive for confusion. Negative for agitation, hallucinations and sleep disturbance. The patient is not nervous/anxious.     Objective:  BP 120/62 (BP Location: Left Arm, Patient Position: Sitting, Cuff Size: Normal)   Pulse 64   Temp 97.9 F (36.6 C) (Oral)   Wt 116 lb 12.8 oz (53 kg)   SpO2 97%   BMI 19.44 kg/m   Weight: 116 lb 12.8 oz (53 kg)   BP Readings from Last 3 Encounters:  07/25/18 120/62  06/20/18 124/76  04/25/18 140/84   Wt Readings from Last 3 Encounters:  07/25/18 116 lb 12.8 oz (53 kg)  06/20/18 121 lb 3.2 oz (55 kg)  04/25/18 120 lb 8 oz (54.7 kg)    Physical Exam  Constitutional: Vital signs are normal. She is cooperative.  Non-toxic appearance. She does not have a sickly appearance. She does not appear ill. No distress.  She is thin  HENT:  Right Ear: Tympanic membrane, external ear and ear  canal normal.  Left Ear: Tympanic membrane, external ear and ear canal normal.  Mouth/Throat: Uvula is midline, oropharynx is clear and moist and mucous membranes are normal. Normal dentition. No dental caries.  She is able to hear all communication within our conversation in the room  Neck: Normal range of motion. No thyroid mass and no thyromegaly present.  Cardiovascular: Normal rate, regular rhythm and normal heart sounds.  Pulses:      Dorsalis pedis pulses are 2+ on the right side, and 2+ on the left side.       Posterior tibial pulses are 2+ on the right side, and 2+ on the left side.  Pulmonary/Chest: Effort normal and breath sounds normal. She has no wheezes. She has no rhonchi. She has no rales.  Abdominal: Soft.  Lymphadenopathy:    She has no cervical adenopathy.  Neurological: She is alert. She displays atrophy (most noted in legs). She displays no tremor. Gait (slow gait with cane; minimal leg  lift) abnormal.  Reflex Scores:      Bicep reflexes are 2+ on the right side and 2+ on the left side.      Brachioradialis reflexes are 2+ on the right side and 2+ on the left side.      Patellar reflexes are 1+ on the right side and 1+ on the left side.      Achilles reflexes are 1+ on the right side and 1+ on the left side. She is oriented to person and place but not time. She returned to wrong room after using adjacent bathroom during visit.   5/5 strength bilat upper extremities; 4+/5 bilat hip flexor, quad and hamstring strength.   Skin:  There is mild scaling bilat palms; improved from documentation of previous exam.   Excoriations right lower back. There is some thinning of skin in this area due to chronic scratching.  No obvious sores.  No rash on upper back or left-sided back.  Psychiatric: She has a normal mood and affect. Her speech is normal. Cognition and memory are impaired. She exhibits abnormal recent memory and abnormal remote memory.  Britta MccreedyBarbara is able to answer  questions in an appropriate manner.  She is not able to give any medical history or recall remote or recent events easily.  She is very calm and comfortable. She is attentive.    Assessment/Plan  1. Essential hypertension Decrease metoprolol dose to 25 mg at bedtime.  Blood pressure is borderline low for her age today and I worry about hypotension and increased fall risk.  This may also help with some of the fatigue she has.  We will continue to evaluate at future visits. If they can record at home that will be helpful. Reassess in 3 months time in office.  - metoprolol tartrate (LOPRESSOR) 50 MG tablet; Take 0.5 tablets (25 mg total) by mouth at bedtime. TAKE 1 TABLET AT BEDTIME  Dispense: 90 tablet; Refill: 3 - CBC with Differential/Platelet; Future - Comprehensive metabolic panel; Future  2. Urinary incontinence, unspecified type Is unable to give urine sample in the office but we did give her tools to collect at home.  We will check to rule out any infection when she is able to bring sample back. - POC Urinalysis Dipstick  3. Frequent falls There is some lower extremity weakness and deconditioning.  I suspect when she is in a group setting and has opportunity for exercise programs and possibly is able to work with physical therapy that this will help decrease her fall risk.  4. Dermatitis I do think if more diligent use of creams as enforced that dermatitis on her back will improve.  If rash is not improved in 2 weeks time please let us know so that we can further evaluate.  Continue with triamcinolone on palms twice daily for the next week.  5. Lives in nursing home She is applying for residence in assisted living facility at [facility].  The FL to form was given to her daughter in the office today to bring with their admission paperwork.  I encouraged them to let me know if any further paperwork is needed.  I do think that she needs placement in a facility where she can have some  monitoring.  She is a risk with her worsening dementia as well as wandering.  Her husband is not able to take care of her properly. - PPD  6. Weight loss I suspect when she is in a facility that is providing meals  she will eat somewhat better.  We will continue to monitor weight and if continuing to lose may need to add protein/supplement as snack.   Return in about 3 months (around 10/25/2018).       Theodis Shove, MD

## 2018-07-25 NOTE — Patient Instructions (Signed)
Collect urine sample at home and return to lab for testing.   I am cutting back the metoprolol to 25mg  at bedtime. If you have cuff at home check blood pressure daily and record. If running over 140/90 regularly return to 50mg  at bedtime.   Can consider decreasing citalopram to 10mg  daily trial if mood is doing well after transition to assisted living.   Monitor rash and if not resolved in 2 weeks time let us know.

## 2018-07-25 NOTE — Progress Notes (Signed)
Tuberculin skin test applied to left ventral forearm. Explained how to read the test, measuring induration not just erythema; she will come into office if test appears positive.

## 2018-07-31 ENCOUNTER — Telehealth: Payer: Self-pay | Admitting: Family Medicine

## 2018-07-31 NOTE — Telephone Encounter (Signed)
Copied from CRM [id number]. Topic: General - Other >> Jul 31, 2018  4:33 PM Baldo DaubAlexander, Amber L wrote: Reason for CRM:   [name] from [facility] calling:  Pt will be moving into [facility] and they need a copy of her TB test results sent to them. FL2 needs corrections she will be sending that via fax Fax - [phone] Phone - [phone]

## 2018-08-02 NOTE — Telephone Encounter (Signed)
Awaiting to receive the fax so our office can fax both TB test results and the FL2.

## 2018-08-03 NOTE — Telephone Encounter (Signed)
° °  [name] with [facility] call to follow up if the fl 2 form was received and tb test is going to be faxed back back pt is moving in on 08/08/18

## 2018-08-06 ENCOUNTER — Other Ambulatory Visit: Payer: Self-pay | Admitting: Family Medicine

## 2018-08-06 ENCOUNTER — Other Ambulatory Visit: Payer: Self-pay | Admitting: Neurology

## 2018-08-06 DIAGNOSIS — I10 Essential (primary) hypertension: Secondary | ICD-10-CM

## 2018-08-06 DIAGNOSIS — F03B18 Unspecified dementia, moderate, with other behavioral disturbance: Secondary | ICD-10-CM

## 2018-08-06 DIAGNOSIS — F0391 Unspecified dementia with behavioral disturbance: Secondary | ICD-10-CM

## 2018-08-06 DIAGNOSIS — M1990 Unspecified osteoarthritis, unspecified site: Secondary | ICD-10-CM

## 2018-08-07 NOTE — Telephone Encounter (Signed)
Copied from CRM [id number]. Topic: Inquiry >> Aug 07, 2018 10:40 AM Windy Kalata, NT wrote: Reason for CRM: [place] from [facility] calling and states the patient moves in tomorrow and she is needing a copy of her TB skin test faxed to her. She states that she still needs the corrections fixed on the Fl2 form that she faxed last week and needs that faxed back as well.   Fax# [phone] Cb# [phone]

## 2018-08-07 NOTE — Telephone Encounter (Signed)
Form received awaiting Dr Salomon Fick to complete

## 2018-08-08 NOTE — Telephone Encounter (Signed)
Pt medical documents (FL2 and diet sheet was signed and faxed back to [facility]. TB test was placed but pt did not return to the clinic for the reading, no TB results available

## 2018-08-31 ENCOUNTER — Other Ambulatory Visit: Payer: Self-pay

## 2018-08-31 DIAGNOSIS — M1990 Unspecified osteoarthritis, unspecified site: Secondary | ICD-10-CM

## 2018-09-01 ENCOUNTER — Emergency Department (HOSPITAL_COMMUNITY)
Admission: EM | Admit: 2018-09-01 | Discharge: 2018-09-03 | Disposition: A | Discharge status: Home / Self Care | Payer: Medicare PPO | Attending: Emergency Medicine | Admitting: Emergency Medicine

## 2018-09-01 ENCOUNTER — Emergency Department (HOSPITAL_COMMUNITY): Payer: Medicare PPO

## 2018-09-01 ENCOUNTER — Encounter (HOSPITAL_COMMUNITY): Payer: Self-pay | Admitting: *Deleted

## 2018-09-01 ENCOUNTER — Other Ambulatory Visit: Payer: Self-pay

## 2018-09-01 DIAGNOSIS — F0390 Unspecified dementia without behavioral disturbance: Secondary | ICD-10-CM | POA: Insufficient documentation

## 2018-09-01 DIAGNOSIS — Z87891 Personal history of nicotine dependence: Secondary | ICD-10-CM | POA: Diagnosis not present

## 2018-09-01 DIAGNOSIS — I10 Essential (primary) hypertension: Secondary | ICD-10-CM | POA: Diagnosis not present

## 2018-09-01 DIAGNOSIS — Y999 Unspecified external cause status: Secondary | ICD-10-CM | POA: Insufficient documentation

## 2018-09-01 DIAGNOSIS — Z79899 Other long term (current) drug therapy: Secondary | ICD-10-CM | POA: Insufficient documentation

## 2018-09-01 DIAGNOSIS — S0990XA Unspecified injury of head, initial encounter: Secondary | ICD-10-CM

## 2018-09-01 DIAGNOSIS — W06XXXA Fall from bed, initial encounter: Secondary | ICD-10-CM | POA: Insufficient documentation

## 2018-09-01 DIAGNOSIS — S0181XA Laceration without foreign body of other part of head, initial encounter: Secondary | ICD-10-CM | POA: Insufficient documentation

## 2018-09-01 DIAGNOSIS — Y929 Unspecified place or not applicable: Secondary | ICD-10-CM | POA: Diagnosis not present

## 2018-09-01 DIAGNOSIS — Y939 Activity, unspecified: Secondary | ICD-10-CM | POA: Insufficient documentation

## 2018-09-01 NOTE — ED Provider Notes (Signed)
[facility] EMERGENCY DEPARTMENT Provider Note   CSN: [id number] Arrival date & time: 09/01/18  4098     History   Chief Complaint Chief Complaint  Patient presents with  . Fall   Level v caveat: dementia  HPI Shirley [name] is a 84 y.o. female.  HPI 84 yo female who presents to ER after rolling out of her bed this AM. Sent to the ER for evidence of closed head injury with left forehead scalp laceration. No longer bleeding. No use of anticoagulants. No recent reported illness. No other hx able to be obtained   Past Medical History:  Diagnosis Date  . Arthritis    "right shoulder; that's why I had to have it replaced"  . Hypertension   . Osteoarthritis   . Osteoporosis   . PMR (polymyalgia rheumatica) [facility])     Patient Active Problem List   Diagnosis Date Noted  . Moderate dementia 08/10/2015  . Frequent falls 05/08/2015  . Anxiety 05/08/2015  . Anxiety state, unspecified 03/06/2014  . Short-term memory loss 12/30/2013  . Lumbar degenerative disc disease 08/22/2011  . Polymyalgia rheumatica (HCC) 05/16/2008  . Essential hypertension 03/12/2008  . Arthritis, senescent 03/12/2008  . MYALGIA 03/12/2008  . OSTEOPOROSIS 03/12/2008    Past Surgical History:  Procedure Laterality Date  . CATARACT EXTRACTION W/ INTRAOCULAR LENS  IMPLANT, BILATERAL     2008   . HIP PINNING,CANNULATED Right 10/20/2013   Procedure: CANNULATED HIP PINNING;  Surgeon: Eldred Manges, MD;  Location: [facility] ORS;  Service: Orthopedics;  Laterality: Right;  . KYPHOPLASTY N/A 02/28/2013   Procedure: KYPHOPLASTY;  Surgeon: Cristi Loron, MD;  Location: [facility] ORS;  Service: Neurosurgery;  Laterality: N/A;  Lumbar two Kyphoplasty  . POSTERIOR FUSION LUMBAR SPINE  03/28/12  . SHOULDER ARTHROSCOPY W/ ROTATOR CUFF REPAIR  ~ 01/2011   right  . TOTAL SHOULDER REPLACEMENT  06/2011   right  . VAGINAL HYSTERECTOMY  1970's     OB History    Gravida  3   Para      Term      Preterm      AB      Living        SAB      TAB      Ectopic      Multiple      Live Births               Home Medications    Prior to Admission medications   Medication Sig Start Date End Date Taking? Authorizing Provider  betamethasone dipropionate (DIPROLENE) 0.05 % cream Apply topically 2 (two) times daily. 06/20/18  Yes Burchette, Elberta Fortis, MD  citalopram (CELEXA) 10 MG tablet TAKE 2 TABLETS EVERY DAY Patient taking differently: Take 20 mg by mouth daily.  04/17/18  Yes Roderick Pee, MD  donepezil (ARICEPT) 10 MG tablet TAKE 1 TABLET EVERY DAY Patient taking differently: Take 10 mg by mouth at bedtime.  11/29/17  Yes Van Clines, MD  meloxicam (MOBIC) 7.5 MG tablet TAKE 1 TABLET EVERY DAY Patient taking differently: Take 7.5 mg by mouth daily as needed for pain.  10/17/17  Yes Roderick Pee, MD  memantine (NAMENDA) 10 MG tablet TAKE 1 TABLET TWICE DAILY Patient taking differently: Take 10 mg by mouth daily.  10/17/17  Yes Van Clines, MD  metoprolol succinate (TOPROL-XL) 25 MG 24 hr tablet Take 25 mg by mouth at bedtime. 08/20/18  Yes [provider]  metoprolol tartrate (LOPRESSOR) 50 MG tablet Take 0.5 tablets (25 mg total) by mouth at bedtime. TAKE 1 TABLET AT BEDTIME Patient not taking: Reported on 09/01/2018 07/25/18   Wynn Banker, MD  triamcinolone cream (KENALOG) 0.1 % Compound 1;1 with Eucerin cream and apply bid prn rash. Patient not taking: Reported on 09/01/2018 04/25/18   Kristian Covey, MD    Family History Family History  Problem Relation Age of Onset  . ALS Father   . Hypertension Sister     Social History Social History   Tobacco Use  . Smoking status: Former Smoker    Packs/day: 0.50    Years: 40.00    Pack years: 20.00    Types: Cigarettes    Last attempt to quit: 12/06/1991    Years since quitting: 26.7  . Smokeless tobacco: Never Used  Substance Use Topics  . Alcohol use: Yes    Alcohol/week: 0.0 standard  drinks    Comment: occ  . Drug use: No     Allergies   Patient has no known allergies.   Review of Systems Review of Systems  Unable to perform ROS: Dementia     Physical Exam Updated Vital Signs BP (!) 175/91   Pulse (!) 59   Temp 98.6 F (37 C) (Oral)   Resp 19   Ht 5\' 5"  (1.651 m)   Wt 53 kg   SpO2 97%   BMI 19.44 kg/m   Physical Exam  Constitutional: She appears well-developed and well-nourished.  HENT:  Head: Normocephalic.  Left forehead laceration  Eyes: EOM are normal.  Neck: Neck supple.  Cervical and paracervical tenderness without stepoff  Cardiovascular: Normal rate and regular rhythm.  Pulmonary/Chest: Effort normal.  Abdominal: She exhibits no distension.  Musculoskeletal: Normal range of motion.  Full ROM of bilateral hips, knees, ankles, shoulders, elbows and wrists  Neurological: She is alert.  Psychiatric: She has a normal mood and affect.  Nursing note and vitals reviewed.    ED Treatments / Results  Labs (all labs ordered are listed, but only abnormal results are displayed) Labs Reviewed - No data to display  EKG None  Radiology Ct Head Wo Contrast  Result Date: 09/01/2018 CLINICAL DATA:  Pain following fall EXAM: CT HEAD WITHOUT CONTRAST CT CERVICAL SPINE WITHOUT CONTRAST TECHNIQUE: Multidetector CT imaging of the head and cervical spine was performed following the standard protocol without intravenous contrast. Multiplanar CT image reconstructions of the cervical spine were also generated. COMPARISON:  CT head and CT cervical spine July 03, 2016 FINDINGS: CT HEAD FINDINGS Brain: There is moderate diffuse atrophy. There is no intracranial mass, hemorrhage, extra-axial fluid collection, or midline shift. There is small vessel disease throughout the periventricular white matter of the centra semiovale bilaterally. Brain parenchyma elsewhere appears unremarkable. No acute infarct is evident. Vascular: There is no hyperdense vessel. There is  calcification in each carotid siphon region. Skull: Bony calvarium appears intact. There is a left frontal scalp hematoma. Sinuses/Orbits: There is mucosal thickening in both maxillary antra. There is also opacification in multiple ethmoid air cells. There is patchy opacification in the right sphenoid sinus. Orbits appear symmetric bilaterally. Evidence of previous cataract extractions. Other: Mastoid air cells are clear. CT CERVICAL SPINE FINDINGS Alignment: There is no evidence spondylolisthesis. Skull base and vertebrae: The skull base and craniocervical junction regions appear normal. There is no appreciable acute fracture. There is stable anterior wedging of the T2 vertebral body. There are no blastic or lytic  bone lesions. Bones appear somewhat osteoporotic. Soft tissues and spinal canal: Prevertebral soft tissues and predental space regions are normal. There is no paraspinous lesion. There is no evident cord or canal hematoma. Disc levels: There is moderate disc space narrowing at C5-6. Other disc spaces appear unremarkable. There is multilevel facet osteoarthritic change. There is no disc extrusion or stenosis. There is no appreciable nerve root edema or effacement. Upper chest: Visualized upper lung zones are clear. Other: There is calcification in both carotid arteries, more on the left than on the right. IMPRESSION: CT head: Atrophy with supratentorial small vessel disease. No acute infarct evident. No mass or hemorrhage. Foci of arterial vascular calcification noted. Small left frontal scalp hematoma.  No fracture. Areas of paranasal sinus disease noted. CT cervical spine: No fracture or spondylolisthesis. Osteoarthritic change noted at multiple levels. No disc extrusion or stenosis. There is bilateral carotid artery calcification, more severe on the left than on the right. Electronically Signed   By: Bretta Bang III M.D.   On: 09/01/2018 13:06   Ct Cervical Spine Wo Contrast  Result Date:  09/01/2018 CLINICAL DATA:  Pain following fall EXAM: CT HEAD WITHOUT CONTRAST CT CERVICAL SPINE WITHOUT CONTRAST TECHNIQUE: Multidetector CT imaging of the head and cervical spine was performed following the standard protocol without intravenous contrast. Multiplanar CT image reconstructions of the cervical spine were also generated. COMPARISON:  CT head and CT cervical spine July 03, 2016 FINDINGS: CT HEAD FINDINGS Brain: There is moderate diffuse atrophy. There is no intracranial mass, hemorrhage, extra-axial fluid collection, or midline shift. There is small vessel disease throughout the periventricular white matter of the centra semiovale bilaterally. Brain parenchyma elsewhere appears unremarkable. No acute infarct is evident. Vascular: There is no hyperdense vessel. There is calcification in each carotid siphon region. Skull: Bony calvarium appears intact. There is a left frontal scalp hematoma. Sinuses/Orbits: There is mucosal thickening in both maxillary antra. There is also opacification in multiple ethmoid air cells. There is patchy opacification in the right sphenoid sinus. Orbits appear symmetric bilaterally. Evidence of previous cataract extractions. Other: Mastoid air cells are clear. CT CERVICAL SPINE FINDINGS Alignment: There is no evidence spondylolisthesis. Skull base and vertebrae: The skull base and craniocervical junction regions appear normal. There is no appreciable acute fracture. There is stable anterior wedging of the T2 vertebral body. There are no blastic or lytic bone lesions. Bones appear somewhat osteoporotic. Soft tissues and spinal canal: Prevertebral soft tissues and predental space regions are normal. There is no paraspinous lesion. There is no evident cord or canal hematoma. Disc levels: There is moderate disc space narrowing at C5-6. Other disc spaces appear unremarkable. There is multilevel facet osteoarthritic change. There is no disc extrusion or stenosis. There is no  appreciable nerve root edema or effacement. Upper chest: Visualized upper lung zones are clear. Other: There is calcification in both carotid arteries, more on the left than on the right. IMPRESSION: CT head: Atrophy with supratentorial small vessel disease. No acute infarct evident. No mass or hemorrhage. Foci of arterial vascular calcification noted. Small left frontal scalp hematoma.  No fracture. Areas of paranasal sinus disease noted. CT cervical spine: No fracture or spondylolisthesis. Osteoarthritic change noted at multiple levels. No disc extrusion or stenosis. There is bilateral carotid artery calcification, more severe on the left than on the right. Electronically Signed   By: Bretta Bang III M.D.   On: 09/01/2018 13:06    Procedures .Marland KitchenLaceration Repair Performed by: Azalia Bilis, MD Authorized  by: Azalia Bilis, MD     LACERATION REPAIR Performed by: Azalia Bilis Consent: Verbal consent obtained. Risks and benefits: risks, benefits and alternatives were discussed Patient identity confirmed: provided demographic data Time out performed prior to procedure Prepped and Draped in normal sterile fashion Wound explored Laceration Location: left forehead Laceration Length: 3cm No Foreign Bodies seen or palpated Anesthesia:none Irrigation method: syringe Amount of cleaning: standard Skin closure: tissue adhesive Number of sutures or staples: tissue adhesive Technique: tissue adhesive Patient tolerance: Patient tolerated the procedure well with no immediate complications.   Medications Ordered in ED Medications - No data to display   Initial Impression / Assessment and Plan / ED Course  I have reviewed the triage vital signs and the nursing notes.  Pertinent labs & imaging results that were available during my care of the patient were reviewed by me and considered in my medical decision making (see chart for details).     Closed head injury. Laceration repaired. CT  imaging without acute trauma. Full ROM of major joints  Final Clinical Impressions(s) / ED Diagnoses   Final diagnoses:  Closed head injury, initial encounter  Forehead laceration, initial encounter    ED Discharge Orders    None       Azalia Bilis, MD 09/01/18 1345

## 2018-09-01 NOTE — ED Notes (Signed)
Got patient undress on the monitor patient is resting with call bell in reach cleaned patient headed up

## 2018-09-01 NOTE — ED Triage Notes (Signed)
PT from [facility]. Pt reported rolling out of bed and hit the Lt side of  Forehead . Small lac on forehead on Lt. Pt base line dementia.

## 2018-09-03 MED ORDER — MELOXICAM 7.5 MG PO TABS: 7.5000 mg | ORAL_TABLET | Freq: Every day | ORAL | 3 refills | Status: DC

## 2018-10-08 ENCOUNTER — Telehealth: Payer: Self-pay | Admitting: Family Medicine

## 2018-10-08 NOTE — Telephone Encounter (Signed)
noted 

## 2018-10-08 NOTE — Telephone Encounter (Signed)
Copied from CRM [id number]. Topic: Quick Communication - See Telephone Encounter >> Oct 08, 2018  4:17 PM Arlyss Gandy, NT wrote: CRM for notification. See Telephone encounter for: 10/08/18. Matthias Hughs with [facility] calling to let Dr. Tawanna Cooler know they will be going to see the pt on 10/10/18 to start with home health services. CB#: [phone]

## 2018-10-30 ENCOUNTER — Telehealth: Payer: Self-pay | Admitting: Adult Health

## 2018-10-30 ENCOUNTER — Ambulatory Visit: Payer: Medicare PPO | Admitting: Family Medicine

## 2018-10-30 NOTE — Telephone Encounter (Signed)
Per Kandee Keenory, he does not have room on his schedule.  Please schedule with another provider to meet her needs.  Thanks!!!

## 2018-10-30 NOTE — Telephone Encounter (Signed)
Patient is a Dr Tawanna Coolerodd patient- She needs a 3 mth f/u but also a Transfer of Care appt.  Sheena said to ask if we can work her in for a TOC appt immediately instead of April which is how [facility]

## 2018-11-06 NOTE — Telephone Encounter (Signed)
Spoke with pt's son, [place] and pt has been scheduled for TOC with Ardyth [facility]psHernandez. Nothing further needed.

## 2018-11-22 ENCOUNTER — Ambulatory Visit: Payer: Medicare PPO | Admitting: Internal Medicine

## 2018-11-22 DIAGNOSIS — Z0289 Encounter for other administrative examinations: Secondary | ICD-10-CM

## 2018-12-18 ENCOUNTER — Emergency Department (HOSPITAL_COMMUNITY)
Admission: EM | Admit: 2018-12-18 | Discharge: 2018-12-18 | Disposition: A | Discharge status: Skilled Nursing Facility | Payer: Medicare PPO | Attending: Emergency Medicine | Admitting: Emergency Medicine

## 2018-12-18 ENCOUNTER — Encounter (HOSPITAL_COMMUNITY): Payer: Self-pay | Admitting: Radiology

## 2018-12-18 ENCOUNTER — Emergency Department (HOSPITAL_COMMUNITY): Payer: Medicare PPO

## 2018-12-18 DIAGNOSIS — Z79899 Other long term (current) drug therapy: Secondary | ICD-10-CM | POA: Insufficient documentation

## 2018-12-18 DIAGNOSIS — Z96611 Presence of right artificial shoulder joint: Secondary | ICD-10-CM | POA: Insufficient documentation

## 2018-12-18 DIAGNOSIS — S0101XA Laceration without foreign body of scalp, initial encounter: Secondary | ICD-10-CM | POA: Insufficient documentation

## 2018-12-18 DIAGNOSIS — I10 Essential (primary) hypertension: Secondary | ICD-10-CM | POA: Insufficient documentation

## 2018-12-18 DIAGNOSIS — Y939 Activity, unspecified: Secondary | ICD-10-CM | POA: Diagnosis not present

## 2018-12-18 DIAGNOSIS — W19XXXA Unspecified fall, initial encounter: Secondary | ICD-10-CM | POA: Diagnosis not present

## 2018-12-18 DIAGNOSIS — Z87891 Personal history of nicotine dependence: Secondary | ICD-10-CM | POA: Insufficient documentation

## 2018-12-18 DIAGNOSIS — Y92129 Unspecified place in nursing home as the place of occurrence of the external cause: Secondary | ICD-10-CM | POA: Insufficient documentation

## 2018-12-18 DIAGNOSIS — F0390 Unspecified dementia without behavioral disturbance: Secondary | ICD-10-CM | POA: Insufficient documentation

## 2018-12-18 DIAGNOSIS — S0990XA Unspecified injury of head, initial encounter: Secondary | ICD-10-CM | POA: Diagnosis present

## 2018-12-18 DIAGNOSIS — S0181XA Laceration without foreign body of other part of head, initial encounter: Secondary | ICD-10-CM

## 2018-12-18 DIAGNOSIS — Y999 Unspecified external cause status: Secondary | ICD-10-CM | POA: Insufficient documentation

## 2018-12-18 NOTE — ED Triage Notes (Signed)
Pt BIB EMS from [facility] w/ unwitnessed fall.  Pt has hx of dementia.  Pt told EMS that she may have fallen out of bed, unreliable historian.  Staff reports pt just walked into hallway with blood on forehead.  Pt is not on blood thinners.   EMS reports 1.5 cm lac above right eyebrow.  Hematoma to right temple. C-collar in place on arrival. Pt denies any pain.

## 2018-12-18 NOTE — Discharge Instructions (Addendum)
We saw you in the ER after you had a fall. All the imaging results are normal, no fractures seen. No evidence of brain bleed. Please be very careful with walking, and do everything possible to prevent falls.  You had a small laceration to your forehead that has been repaired with Dermabond.  Please keep the adhesive strips on as long as possible.

## 2018-12-18 NOTE — ED Notes (Signed)
Dermabond is placed at bedside.

## 2018-12-18 NOTE — ED Notes (Signed)
[facility] Expected date:  Expected time:  Means of arrival:  Comments: EMS 85yo from memory care, lac on left temple, c collar

## 2018-12-18 NOTE — ED Provider Notes (Addendum)
[facility]-EMERGENCY DEPT Provider Note   CSN: [id number] Arrival date & time: 12/18/18  1003     History   Chief Complaint No chief complaint on file.   HPI Shirley [name] is a 85 y.o. female.  HPI  85 year old female comes in a chief complaint of fall. Patient has history of hypertension, OA and mild dementia. According to the EMS, patient had an unwitnessed fall at her nursing home.  Patient informs me that she might have fallen out of her bed.  She thinks that she might have struck the bed frame.  Patient has a laceration to her forehead.  She denies any severe headache, neck pain, numbness, tingling, weakness or vision change.  Patient does not take any blood thinners.  Past Medical History:  Diagnosis Date  . Arthritis    "right shoulder; that's why I had to have it replaced"  . Hypertension   . Osteoarthritis   . Osteoporosis   . PMR (polymyalgia rheumatica) [facility](HCC)     Patient Active Problem List   Diagnosis Date Noted  . Moderate dementia (HCC) 08/10/2015  . Frequent falls 05/08/2015  . Anxiety 05/08/2015  . Anxiety state, unspecified 03/06/2014  . Short-term memory loss 12/30/2013  . Lumbar degenerative disc disease 08/22/2011  . Polymyalgia rheumatica (HCC) 05/16/2008  . Essential hypertension 03/12/2008  . Arthritis, senescent 03/12/2008  . MYALGIA 03/12/2008  . OSTEOPOROSIS 03/12/2008    Past Surgical History:  Procedure Laterality Date  . CATARACT EXTRACTION W/ INTRAOCULAR LENS  IMPLANT, BILATERAL     2008   . HIP PINNING,CANNULATED Right 10/20/2013   Procedure: CANNULATED HIP PINNING;  Surgeon: Eldred MangesMark C Yates, MD;  Location: [facility] ORS;  Service: Orthopedics;  Laterality: Right;  . KYPHOPLASTY N/A 02/28/2013   Procedure: KYPHOPLASTY;  Surgeon: Cristi LoronJeffrey D Jenkins, MD;  Location: [facility] OR[facility];  Service: Neurosurgery;  Laterality: N/A;  Lumbar two Kyphoplasty  . POSTERIOR FUSION LUMBAR SPINE  03/28/12  . SHOULDER ARTHROSCOPY W/ ROTATOR  CUFF REPAIR  ~ 01/2011   right  . TOTAL SHOULDER REPLACEMENT  06/2011   right  . VAGINAL HYSTERECTOMY  1970's     OB History    Gravida  3   Para      Term      Preterm      AB      Living        SAB      TAB      Ectopic      Multiple      Live Births               Home Medications    Prior to Admission medications   Medication Sig Start Date End Date Taking? Authorizing Provider  citalopram (CELEXA) 10 MG tablet TAKE 2 TABLETS EVERY DAY Patient taking differently: Take 20 mg by mouth daily.  04/17/18  Yes Roderick Peeodd, Jeffrey A, MD  donepezil (ARICEPT) 10 MG tablet TAKE 1 TABLET EVERY DAY Patient taking differently: Take 10 mg by mouth at bedtime.  11/29/17  Yes Van ClinesAquino, Karen M, MD  memantine (NAMENDA) 10 MG tablet TAKE 1 TABLET TWICE DAILY Patient taking differently: Take 10 mg by mouth daily.  10/17/17  Yes Van ClinesAquino, Karen M, MD  metoprolol succinate (TOPROL-XL) 25 MG 24 hr tablet Take 25 mg by mouth at bedtime. 08/20/18  Yes [provider]  betamethasone dipropionate (DIPROLENE) 0.05 % cream Apply topically 2 (two) times daily. Patient not taking: Reported on 12/18/2018 06/20/18  Burchette, Elberta Fortis, MD  meloxicam (MOBIC) 7.5 MG tablet Take 1 tablet (7.5 mg total) by mouth daily. Patient not taking: Reported on 12/18/2018 09/03/18   Roderick Pee, MD  metoprolol tartrate (LOPRESSOR) 50 MG tablet Take 0.5 tablets (25 mg total) by mouth at bedtime. TAKE 1 TABLET AT BEDTIME Patient not taking: Reported on 09/01/2018 07/25/18   Wynn Banker, MD  triamcinolone cream (KENALOG) 0.1 % Compound 1;1 with Eucerin cream and apply bid prn rash. Patient not taking: Reported on 09/01/2018 04/25/18   Kristian Covey, MD    Family History Family History  Problem Relation Age of Onset  . ALS Father   . Hypertension Sister     Social History Social History   Tobacco Use  . Smoking status: Former Smoker    Packs/day: 0.50    Years: 40.00    Pack years: 20.00      Types: Cigarettes    Last attempt to quit: 12/06/1991    Years since quitting: 27.0  . Smokeless tobacco: Never Used  Substance Use Topics  . Alcohol use: Yes    Alcohol/week: 0.0 standard drinks    Comment: occ  . Drug use: No     Allergies   Patient has no known allergies.   Review of Systems Review of Systems  Unable to perform ROS: Dementia  Constitutional: Positive for activity change.  Skin: Positive for rash and wound.  Neurological: Negative for headaches.  Hematological: Does not bruise/bleed easily.     Physical Exam Updated Vital Signs BP (!) 142/79   Pulse 72   Temp 98.4 F (36.9 C) (Oral)   Resp 14   SpO2 97%   Physical Exam Vitals signs and nursing note reviewed.  Constitutional:      Appearance: She is well-developed.  HENT:     Head: Normocephalic and atraumatic.  Neck:     Musculoskeletal: Normal range of motion and neck supple.     Comments: No midline c-spine tenderness, pt able to turn head to 45 degrees bilaterally without any pain and able to flex neck to the chest and extend without any pain or neurologic symptoms.  Cardiovascular:     Rate and Rhythm: Normal rate.  Pulmonary:     Effort: Pulmonary effort is normal.  Abdominal:     General: Bowel sounds are normal.  Musculoskeletal:     Comments: Patient has a 1.5 cm laceration above her left eyebrow.  She also has surrounding ecchymosis and hematoma on the right side of her forehead. OTHERWISE:  Head to toe evaluation shows no hematoma, bleeding of the scalp, no facial abrasions, no spine step offs, crepitus of the chest or neck, no tenderness to palpation of the bilateral upper and lower extremities, no gross deformities, no chest tenderness, no pelvic pain.   Skin:    General: Skin is warm and dry.  Neurological:     Mental Status: She is alert and oriented to person, place, and time.      ED Treatments / Results  Labs (all labs ordered are listed, but only abnormal  results are displayed) Labs Reviewed - No data to display  EKG None  Radiology Ct Head Wo Contrast  Result Date: 12/18/2018 CLINICAL DATA:  Unwitnessed fall.  Dementia. EXAM: CT HEAD WITHOUT CONTRAST TECHNIQUE: Contiguous axial images were obtained from the base of the skull through the vertex without intravenous contrast. COMPARISON:  September 01, 2018 FINDINGS: Brain: Moderate diffuse atrophy is stable. There is no intracranial  mass, hemorrhage, extra-axial fluid collection, or midline shift. There is small vessel disease throughout the centra semiovale bilaterally. There is also small vessel disease in the posterior aspect of the left internal capsule. No acute infarct is demonstrable. Vascular: No hyperdense vessel. There is calcification in each carotid siphon region. Skull: The bony calvarium appears intact. There is a small left frontal scalp hematoma. Sinuses/Orbits: There is mucosal thickening in both maxillary antra with an apparent polyp arising in the inferior left maxillary antrum. There is diffuse opacification of most ethmoid air cells. There is mucosal thickening in the inferior right maxillary antrum. There is mild mucosal thickening in the left anterior sphenoid sinus. Orbits appear symmetric bilaterally. Patient is status post cataract removals bilaterally. Other: Mastoid air cells are clear. IMPRESSION: Stable atrophy with supratentorial small vessel disease. No evident acute infarct. No mass or hemorrhage. Small left frontal scalp hematoma.  No fracture evident. Foci of arterial vascular calcification noted. Multifocal paranasal sinus disease, most severe in the ethmoids bilaterally. Electronically Signed   By: Bretta Bang III M.D.   On: 12/18/2018 12:06    Procedures .Marland KitchenLaceration Repair Date/Time: 12/18/2018 1:01 PM Performed by: Derwood Kaplan, MD Authorized by: Derwood Kaplan, MD   Consent:    Consent obtained:  Verbal   Consent given by:  Patient   Risks  discussed:  Infection, pain, poor cosmetic result and poor wound healing   Alternatives discussed:  No treatment (sutures) Anesthesia (see MAR for exact dosages):    Anesthesia method:  None Laceration details:    Location:  Scalp   Scalp location:  Frontal   Length (cm):  1.5   Depth (mm):  2 Repair type:    Repair type:  Simple Exploration:    Wound exploration: wound explored through full range of motion   Treatment:    Area cleansed with:  Saline   Amount of cleaning:  Standard   Irrigation solution:  Sterile water Skin repair:    Repair method:  Steri-Strips   Number of Steri-Strips:  2 Approximation:    Approximation:  Close Post-procedure details:    Dressing:  Open (no dressing)   Patient tolerance of procedure:  Tolerated well, no immediate complications   (including critical care time)  Medications Ordered in ED Medications - No data to display   Initial Impression / Assessment and Plan / ED Course  I have reviewed the triage vital signs and the nursing notes.  Pertinent labs & imaging results that were available during my care of the patient were reviewed by me and considered in my medical decision making (see chart for details).     85 year old female comes in after a fall. She has dementia -and therefore not the best historian, however I feel comfortable clearing her C-spine based on our C-spine exam.  We will get a CT head because of her trauma to the forehead to rule out brain bleed.  Laceration will be repaired with Dermabond.  Patient prefers Dermabond over sutures.  3:13 PM Patient has been discharged now for more than an hour.  She is awaiting a ride back to her nursing home.  I just noticed that patient remove the Steri-Strips that were placed.  She also has been cleaning her forehead with wet towel.  I have requested the nurse to place a dressing.  I do not think patient needs sutures, I called the 2 numbers in file to see family can be reached to let  him know the decision making, however no  one picked up on either of the phones listed on patient's demographics.  We will apply dressing to the area and discharge.  Final Clinical Impressions(s) / ED Diagnoses   Final diagnoses:  Forehead laceration, initial encounter  Fall, initial encounter    ED Discharge Orders    None       Derwood Kaplan, MD 12/18/18 1307    Derwood Kaplan, MD 12/18/18 1514

## 2019-01-11 ENCOUNTER — Other Ambulatory Visit: Payer: Self-pay | Admitting: *Deleted

## 2019-01-11 NOTE — Telephone Encounter (Signed)
Patient will need to schedule an appointment with a new provider.

## 2019-11-23 ENCOUNTER — Emergency Department (HOSPITAL_COMMUNITY): Payer: Medicare PPO

## 2019-11-23 ENCOUNTER — Encounter (HOSPITAL_COMMUNITY): Payer: Self-pay | Admitting: Emergency Medicine

## 2019-11-23 ENCOUNTER — Other Ambulatory Visit: Payer: Self-pay

## 2019-11-23 ENCOUNTER — Inpatient Hospital Stay (HOSPITAL_COMMUNITY)
Admission: EM | Admit: 2019-11-23 | Discharge: 2019-11-28 | DRG: 092 | Disposition: A | Discharge status: Skilled Nursing Facility | Payer: Medicare PPO | Attending: Osteopathic Medicine | Admitting: Osteopathic Medicine

## 2019-11-23 DIAGNOSIS — E538 Deficiency of other specified B group vitamins: Secondary | ICD-10-CM | POA: Diagnosis present

## 2019-11-23 DIAGNOSIS — F0390 Unspecified dementia without behavioral disturbance: Secondary | ICD-10-CM | POA: Diagnosis not present

## 2019-11-23 DIAGNOSIS — Z79899 Other long term (current) drug therapy: Secondary | ICD-10-CM

## 2019-11-23 DIAGNOSIS — K0510 Chronic gingivitis, plaque induced: Secondary | ICD-10-CM | POA: Diagnosis present

## 2019-11-23 DIAGNOSIS — Z66 Do not resuscitate: Secondary | ICD-10-CM | POA: Diagnosis not present

## 2019-11-23 DIAGNOSIS — E860 Dehydration: Secondary | ICD-10-CM | POA: Diagnosis present

## 2019-11-23 DIAGNOSIS — R29810 Facial weakness: Secondary | ICD-10-CM | POA: Diagnosis present

## 2019-11-23 DIAGNOSIS — Z90710 Acquired absence of both cervix and uterus: Secondary | ICD-10-CM

## 2019-11-23 DIAGNOSIS — Z781 Physical restraint status: Secondary | ICD-10-CM

## 2019-11-23 DIAGNOSIS — M810 Age-related osteoporosis without current pathological fracture: Secondary | ICD-10-CM | POA: Diagnosis present

## 2019-11-23 DIAGNOSIS — G9340 Encephalopathy, unspecified: Secondary | ICD-10-CM | POA: Diagnosis present

## 2019-11-23 DIAGNOSIS — Z515 Encounter for palliative care: Secondary | ICD-10-CM

## 2019-11-23 DIAGNOSIS — G92 Toxic encephalopathy: Secondary | ICD-10-CM | POA: Diagnosis not present

## 2019-11-23 DIAGNOSIS — Z96611 Presence of right artificial shoulder joint: Secondary | ICD-10-CM | POA: Diagnosis present

## 2019-11-23 DIAGNOSIS — R296 Repeated falls: Secondary | ICD-10-CM | POA: Diagnosis present

## 2019-11-23 DIAGNOSIS — Z87440 Personal history of urinary (tract) infections: Secondary | ICD-10-CM

## 2019-11-23 DIAGNOSIS — R509 Fever, unspecified: Secondary | ICD-10-CM | POA: Diagnosis not present

## 2019-11-23 DIAGNOSIS — Z7189 Other specified counseling: Secondary | ICD-10-CM

## 2019-11-23 DIAGNOSIS — F419 Anxiety disorder, unspecified: Secondary | ICD-10-CM | POA: Diagnosis present

## 2019-11-23 DIAGNOSIS — D751 Secondary polycythemia: Secondary | ICD-10-CM | POA: Diagnosis present

## 2019-11-23 DIAGNOSIS — M779 Enthesopathy, unspecified: Secondary | ICD-10-CM | POA: Diagnosis present

## 2019-11-23 DIAGNOSIS — Z20828 Contact with and (suspected) exposure to other viral communicable diseases: Secondary | ICD-10-CM | POA: Diagnosis present

## 2019-11-23 DIAGNOSIS — Z7289 Other problems related to lifestyle: Secondary | ICD-10-CM

## 2019-11-23 DIAGNOSIS — F05 Delirium due to known physiological condition: Secondary | ICD-10-CM | POA: Diagnosis not present

## 2019-11-23 DIAGNOSIS — Z981 Arthrodesis status: Secondary | ICD-10-CM

## 2019-11-23 DIAGNOSIS — M353 Polymyalgia rheumatica: Secondary | ICD-10-CM | POA: Diagnosis present

## 2019-11-23 DIAGNOSIS — K047 Periapical abscess without sinus: Secondary | ICD-10-CM | POA: Diagnosis present

## 2019-11-23 DIAGNOSIS — R29710 NIHSS score 10: Secondary | ICD-10-CM | POA: Diagnosis present

## 2019-11-23 DIAGNOSIS — I712 Thoracic aortic aneurysm, without rupture: Secondary | ICD-10-CM | POA: Diagnosis present

## 2019-11-23 DIAGNOSIS — I10 Essential (primary) hypertension: Secondary | ICD-10-CM | POA: Diagnosis not present

## 2019-11-23 DIAGNOSIS — Z8249 Family history of ischemic heart disease and other diseases of the circulatory system: Secondary | ICD-10-CM

## 2019-11-23 DIAGNOSIS — I7121 Aneurysm of the ascending aorta, without rupture: Secondary | ICD-10-CM | POA: Diagnosis present

## 2019-11-23 DIAGNOSIS — Z87891 Personal history of nicotine dependence: Secondary | ICD-10-CM

## 2019-11-23 DIAGNOSIS — F03B0 Unspecified dementia, moderate, without behavioral disturbance, psychotic disturbance, mood disturbance, and anxiety: Secondary | ICD-10-CM | POA: Diagnosis present

## 2019-11-23 DIAGNOSIS — R4182 Altered mental status, unspecified: Secondary | ICD-10-CM

## 2019-11-23 DIAGNOSIS — G8194 Hemiplegia, unspecified affecting left nondominant side: Secondary | ICD-10-CM | POA: Diagnosis present

## 2019-11-23 DIAGNOSIS — R4781 Slurred speech: Secondary | ICD-10-CM | POA: Diagnosis present

## 2019-11-23 LAB — PROTIME-INR
INR: 1 (ref 0.8–1.2)
Prothrombin Time: 13.4 seconds (ref 11.4–15.2)

## 2019-11-23 LAB — DIFFERENTIAL
Abs Immature Granulocytes: 0.01 10*3/uL (ref 0.00–0.07)
Basophils Absolute: 0.1 10*3/uL (ref 0.0–0.1)
Basophils Relative: 1 %
Eosinophils Absolute: 0 10*3/uL (ref 0.0–0.5)
Eosinophils Relative: 0 %
Immature Granulocytes: 0 %
Lymphocytes Relative: 10 %
Lymphs Abs: 0.6 10*3/uL — ABNORMAL LOW (ref 0.7–4.0)
Monocytes Absolute: 1.2 10*3/uL — ABNORMAL HIGH (ref 0.1–1.0)
Monocytes Relative: 19 %
Neutro Abs: 4.7 10*3/uL (ref 1.7–7.7)
Neutrophils Relative %: 70 %

## 2019-11-23 LAB — I-STAT CHEM 8, ED
BUN: 23 mg/dL (ref 8–23)
Calcium, Ion: 1.05 mmol/L — ABNORMAL LOW (ref 1.15–1.40)
Chloride: 99 mmol/L (ref 98–111)
Creatinine, Ser: 0.8 mg/dL (ref 0.44–1.00)
Glucose, Bld: 154 mg/dL — ABNORMAL HIGH (ref 70–99)
HCT: 53 % — ABNORMAL HIGH (ref 36.0–46.0)
Hemoglobin: 18 g/dL — ABNORMAL HIGH (ref 12.0–15.0)
Potassium: 3.9 mmol/L (ref 3.5–5.1)
Sodium: 137 mmol/L (ref 135–145)
TCO2: 25 mmol/L (ref 22–32)

## 2019-11-23 LAB — CBC
HCT: 51.3 % — ABNORMAL HIGH (ref 36.0–46.0)
Hemoglobin: 16.8 g/dL — ABNORMAL HIGH (ref 12.0–15.0)
MCH: 30.4 pg (ref 26.0–34.0)
MCHC: 32.7 g/dL (ref 30.0–36.0)
MCV: 92.9 fL (ref 80.0–100.0)
Platelets: 192 10*3/uL (ref 150–400)
RBC: 5.52 MIL/uL — ABNORMAL HIGH (ref 3.87–5.11)
RDW: 13.2 % (ref 11.5–15.5)
WBC: 6.6 10*3/uL (ref 4.0–10.5)
nRBC: 0 % (ref 0.0–0.2)

## 2019-11-23 LAB — CBG MONITORING, ED: Glucose-Capillary: 157 mg/dL — ABNORMAL HIGH (ref 70–99)

## 2019-11-23 LAB — COMPREHENSIVE METABOLIC PANEL
ALT: 18 U/L (ref 0–44)
AST: 29 U/L (ref 15–41)
Albumin: 3.9 g/dL (ref 3.5–5.0)
Alkaline Phosphatase: 79 U/L (ref 38–126)
Anion gap: 12 (ref 5–15)
BUN: 19 mg/dL (ref 8–23)
CO2: 24 mmol/L (ref 22–32)
Calcium: 9.1 mg/dL (ref 8.9–10.3)
Chloride: 100 mmol/L (ref 98–111)
Creatinine, Ser: 0.93 mg/dL (ref 0.44–1.00)
GFR calc Af Amer: >60 mL/min (ref >60)
GFR calc non Af Amer: 57 mL/min — ABNORMAL LOW (ref >60)
Glucose, Bld: 154 mg/dL — ABNORMAL HIGH (ref 70–99)
Potassium: 3.9 mmol/L (ref 3.5–5.1)
Sodium: 136 mmol/L (ref 135–145)
Total Bilirubin: 0.6 mg/dL (ref 0.3–1.2)
Total Protein: 7.4 g/dL (ref 6.5–8.1)

## 2019-11-23 LAB — APTT: aPTT: 26 seconds (ref 24–36)

## 2019-11-23 MED ORDER — DONEPEZIL HCL 10 MG PO TABS
10.0000 mg | ORAL_TABLET | Freq: Every day | ORAL | Status: DC
  Administered 2019-11-26 – 2019-11-27 (×2): 10 mg via ORAL
  Filled 2019-11-23 (×3): qty 1

## 2019-11-23 MED ORDER — METOPROLOL TARTRATE 25 MG PO TABS: 25.0000 mg | ORAL_TABLET | Freq: Two times a day (BID) | ORAL | Status: DC

## 2019-11-23 MED ORDER — SODIUM CHLORIDE 0.9 % IV SOLN: INTRAVENOUS | Status: AC

## 2019-11-23 MED ORDER — IOHEXOL 350 MG/ML SOLN
100.0000 mL | Freq: Once | INTRAVENOUS | Status: AC | PRN
  Administered 2019-11-23: 100 mL via INTRAVENOUS

## 2019-11-23 MED ORDER — SODIUM CHLORIDE 0.9% FLUSH: 3.0000 mL | Freq: Once | INTRAVENOUS | Status: DC

## 2019-11-23 MED ORDER — CITALOPRAM HYDROBROMIDE 20 MG PO TABS
10.0000 mg | ORAL_TABLET | Freq: Every day | ORAL | Status: DC
  Filled 2019-11-23: qty 1

## 2019-11-23 NOTE — Consult Note (Signed)
Neurology Consultation  Reason for Consult: Code stroke for left-sided weakness Referring Physician: Dr. Virgel Manifold  CC: Left-sided weakness  History is obtained from: Chart, patient's son over the phone, facility  HPI: Shirley [name] is a 85 y.o. female past medical history of severe dementia, hypertension, polymyalgia rheumatica brought into the emergency room as an acute code stroke by EMS from her memory care unit at [facility]. Patient was last normal according to the staff questionably around 5:15 PM but they could not confirm the exact last normal.  The family also could not confirm the last normal.  They have not been allowed to visit the facility. She was noted to be lethargic and on EMS arrival noted to have some left facial droop. In the emergency room, she was taken for stat CT head.  Her examination was nonfocal with the exception of a subtle left nasolabial fold flattening.  NIH was 10. Noncontrast head CT unremarkable for acute process.  CTA and perfusion also did not reveal any emergent large vessel occlusion or perfusion deficit.  At baseline, patient uses a walker to walk-confirmed by the son, at least since March.  She is able to have some conversations but most of her conversations are loopy according to the son and rooted in the past and she has no orientation to the current time place or situation at baseline.  The facility was unable to give me a clear history, which I obtained over the phone along with the patient's nurses in the CT scan suite.  Son did report that she gets frequent UTIs.    LKW: Unclear tpa given?: no, unclear last normal, nonfocal exam Premorbid modified Rankin scale (mRS):3 at least  ROS: Unable to obtain due to altered mental status.   Past Medical History:  Diagnosis Date  . Arthritis    "right shoulder; that's why I had to have it replaced"  . Hypertension   . Osteoarthritis   . Osteoporosis   . PMR (polymyalgia rheumatica) (HCC)       Family History  Problem Relation Age of Onset  . ALS Father   . Hypertension Sister    Social History:   reports that she quit smoking about 27 years ago. Her smoking use included cigarettes. She has a 20.00 pack-year smoking history. She has never used smokeless tobacco. She reports current alcohol use. She reports that she does not use drugs.  Medications  Current Facility-Administered Medications:  .  sodium chloride flush (NS) 0.9 % injection 3 mL, 3 mL, Intravenous, Once, Virgel Manifold, MD  Current Outpatient Medications:  .  betamethasone dipropionate (DIPROLENE) 0.05 % cream, Apply topically 2 (two) times daily. (Patient not taking: Reported on 12/18/2018), Disp: 30 g, Rfl: 2 .  citalopram (CELEXA) 10 MG tablet, TAKE 2 TABLETS EVERY DAY (Patient taking differently: Take 20 mg by mouth daily. ), Disp: 180 tablet, Rfl: 4 .  donepezil (ARICEPT) 10 MG tablet, TAKE 1 TABLET EVERY DAY (Patient taking differently: Take 10 mg by mouth at bedtime. ), Disp: 90 tablet, Rfl: 3 .  hydrocortisone cream 1 %, Apply 1 application topically 2 (two) times daily., Disp: , Rfl:  .  meloxicam (MOBIC) 7.5 MG tablet, Take 1 tablet (7.5 mg total) by mouth daily. (Patient not taking: Reported on 12/18/2018), Disp: 90 tablet, Rfl: 3 .  memantine (NAMENDA) 10 MG tablet, TAKE 1 TABLET TWICE DAILY (Patient taking differently: Take 10 mg by mouth daily. ), Disp: 180 tablet, Rfl: 3 .  metoprolol succinate (  TOPROL-XL) 25 MG 24 hr tablet, Take 25 mg by mouth at bedtime., Disp: , Rfl:  .  metoprolol tartrate (LOPRESSOR) 50 MG tablet, Take 0.5 tablets (25 mg total) by mouth at bedtime. TAKE 1 TABLET AT BEDTIME (Patient not taking: Reported on 12/18/2018), Disp: 90 tablet, Rfl: 3 .  triamcinolone cream (KENALOG) 0.1 %, Compound 1;1 with Eucerin cream and apply bid prn rash. (Patient not taking: Reported on 09/01/2018), Disp: 453 g, Rfl: 1   Exam: Current vital signs: BP (!) 151/78 (BP Location: Left Arm)   Pulse  78   Temp 98 F (36.7 C) (Oral)   Resp 18   Wt 62.4 kg   SpO2 95%   BMI 22.90 kg/m  Vital signs in last 24 hours: Temp:  [98 F (36.7 C)] 98 F (36.7 C) (12/19 1850) Pulse Rate:  [78] 78 (12/19 1850) Resp:  [18] 18 (12/19 1850) BP: (151)/(78) 151/78 (12/19 1850) SpO2:  [95 %] 95 % (12/19 1850) Weight:  [62.4 kg] 62.4 kg (12/19 1800) General: Awake, at times closing her eyes volitionally but opens eyes to command. HEENT: Normocephalic atraumatic CVS: Regular rhythm Respiratory: Chest clear to auscultation Extremities warm well perfused Abdomen: Nondistended nontender Neurological exam Awake, follows simple commands inconsistently. Did not follow sticking her tongue out or making a fist or closing her eyes during the initial NIH evaluation but later time did respond to some simple commands such as raising her arms. Speech is moderately dysarthric She is unable to name or repeat. Upon asking her age, she said Jesus. Cranial nerves: Pupils equal round react light, extraocular was intact, visual fields appear full to threat, subtle left nasolabial fold flattening but upon smiling no weakness noted.  Tongue and palate midline. Motor exam: Both upper extremities are antigravity without drift.  Both lower extremities barely antigravity and fall to the bed immediately on passively trying. Sensory exam: Grimaces to noxious stimulation on both but possibly with a stronger grimace when noxious stimulation is applied to the right compared to the left. Coordination difficult to assess Gait difficult assess due to mentation NIH stroke scale-10   Labs I have reviewed labs in epic and the results pertinent to this consultation are: CBC    Component Value Date/Time   WBC 6.6 11/23/2019 1849   RBC 5.52 (H) 11/23/2019 1849   HGB 18.0 (H) 11/23/2019 1850   HCT 53.0 (H) 11/23/2019 1850   PLT 192 11/23/2019 1849   MCV 92.9 11/23/2019 1849   MCH 30.4 11/23/2019 1849   MCHC 32.7 11/23/2019  1849   RDW 13.2 11/23/2019 1849   LYMPHSABS 0.6 (L) 11/23/2019 1849   MONOABS 1.2 (H) 11/23/2019 1849   EOSABS 0.0 11/23/2019 1849   BASOSABS 0.1 11/23/2019 1849    CMP     Component Value Date/Time   NA 137 11/23/2019 1850   K 3.9 11/23/2019 1850   CL 99 11/23/2019 1850   CO2 33 (H) 08/23/2016 1008   GLUCOSE 154 (H) 11/23/2019 1850   BUN 23 11/23/2019 1850   CREATININE 0.80 11/23/2019 1850   CALCIUM 9.3 08/23/2016 1008   PROT 7.3 08/23/2016 1008   ALBUMIN 4.1 08/23/2016 1008   AST 22 08/23/2016 1008   ALT 13 08/23/2016 1008   ALKPHOS 70 08/23/2016 1008   BILITOT 0.5 08/23/2016 1008   GFRNONAA >60 07/06/2015 0406   GFRAA >60 07/06/2015 0406   Imaging I have reviewed the images obtained:  CT-scan of the brain-no acute changes CTA-no emergent LVO CT perfusion-no perfusion  deficit  Assessment:  85 year old past medical history severe dementia hypertension polymyalgia rheumatica brought in as an acute code stroke from memory care unit with an unclear last known normal time for potential stroke evaluation due to lethargy and on EMS evaluation potential left facial droop. On my examination, there was a subtle left nasolabial fold flattening but otherwise the exam was nonfocal and encephalopathic. Due to unclear last known normal and nonfocal exam, TPA was not given. Not a candidate for intervention due to no emergent LVO on imaging. Most likely toxic metabolic encephalopathy versus behavioral changes with dementia given the history of advanced dementia.   Impression: Multifactorial toxic metabolic encephalopathy Behavioral changes with dementia Evaluate for stroke-obtain MRI  Recommendations: Check UA Check chest x-ray Check B12 TSH Check ammonia level Check labs to include CMP Brain MRI when able to Correct derangements as noted on any of the tests above Frequent neurochecks No stroke work-up unless MRI of the brain shows a stroke. Neurology will follow up with  you. Plan relayed to Dr. Morrie Sheldon, ED resident.   -- Milon Dikes, MD [facility] Pager: [phone] If 7pm to 7am, please call on call as listed on AMION.

## 2019-11-23 NOTE — H&P (Addendum)
History and Physical    Shirley BealBarbara B Spearing ZOX:[id number] DOB: 1934/01/18 DOA: 11/23/2019  PCP: Roderick Peeodd, Jeffrey A, MD (Inactive)   Patient coming from: [facility]   Chief Complaint: Increased confusion, left-sided weakness   HPI: Shirley [name] is a 85 y.o. female with medical history significant for dementia, anxiety, hypertension, and polymyalgia rheumatica, now presenting to the emergency department from her nursing facility for evaluation of increased confusion and left-sided weakness.  History is somewhat unclear but these changes were apparently noticed at around 6 PM with last seen in usual state possibly within an hour prior to that.  At her baseline, the patient is reportedly oriented to self only, ambulates with a walker, and does not have dysarthria though her speech is usually nonsensical.  Patient is unable to contribute much to the history due to her clinical condition.  ED Course: Upon arrival to the ED, patient is found to be afebrile, saturating well on room air, and with stable blood pressure.  Chest x-ray is notable for a left retrocardiac opacity that appears similar to remote prior.  Chemistry panel is unremarkable.  CBC with mild polycythemia.  Noncontrast head CT is negative for acute intracranial abnormality.  CTA head and neck negative for emergent LVO and no evidence for significant ischemia on CT perfusion study.  Neurology was consulted by the ED physician and recommends MRI brain, further stroke work-up if positive for acute infarct, but otherwise evaluate for causes of suspected toxic metabolic encephalopathy.  Review of Systems:  Unable to complete ROS secondary to the patient's clinical condition.  Past Medical History:  Diagnosis Date  . Arthritis    "right shoulder; that's why I had to have it replaced"  . Hypertension   . Osteoarthritis   . Osteoporosis   . PMR (polymyalgia rheumatica) (HCC)     Past Surgical History:  Procedure Laterality  Date  . CATARACT EXTRACTION W/ INTRAOCULAR LENS  IMPLANT, BILATERAL     2008   . HIP PINNING,CANNULATED Right 10/20/2013   Procedure: CANNULATED HIP PINNING;  Surgeon: Eldred MangesMark C Yates, MD;  Location: [facility];  Service: Orthopedics;  Laterality: Right;  . KYPHOPLASTY N/A 02/28/2013   Procedure: KYPHOPLASTY;  Surgeon: Cristi LoronJeffrey D Jenkins, MD;  Location: [facility];  Service: Neurosurgery;  Laterality: N/A;  Lumbar two Kyphoplasty  . POSTERIOR FUSION LUMBAR SPINE  03/28/12  . SHOULDER ARTHROSCOPY W/ ROTATOR CUFF REPAIR  ~ 01/2011   right  . TOTAL SHOULDER REPLACEMENT  06/2011   right  . VAGINAL HYSTERECTOMY  1970's     reports that she quit smoking about 27 years ago. Her smoking use included cigarettes. She has a 20.00 pack-year smoking history. She has never used smokeless tobacco. She reports current alcohol use. She reports that she does not use drugs.  No Known Allergies  Family History  Problem Relation Age of Onset  . ALS Father   . Hypertension Sister      Prior to Admission medications   Medication Sig Start Date End Date Taking? Authorizing Provider  betamethasone dipropionate (DIPROLENE) 0.05 % cream Apply topically 2 (two) times daily. Patient not taking: Reported on 12/18/2018 06/20/18   Kristian CoveyBurchette, Bruce W, MD  citalopram (CELEXA) 10 MG tablet TAKE 2 TABLETS EVERY DAY Patient taking differently: Take 20 mg by mouth daily.  04/17/18   Roderick Peeodd, Jeffrey A, MD  donepezil (ARICEPT) 10 MG tablet TAKE 1 TABLET EVERY DAY Patient taking differently: Take 10 mg by mouth at bedtime.  11/29/17  Van Clines, MD  hydrocortisone cream 1 % Apply 1 application topically 2 (two) times daily.    [provider]  meloxicam (MOBIC) 7.5 MG tablet Take 1 tablet (7.5 mg total) by mouth daily. Patient not taking: Reported on 12/18/2018 09/03/18   Roderick Pee, MD  memantine (NAMENDA) 10 MG tablet TAKE 1 TABLET TWICE DAILY Patient taking differently: Take 10 mg by mouth daily.  10/17/17    Van Clines, MD  metoprolol succinate (TOPROL-XL) 25 MG 24 hr tablet Take 25 mg by mouth at bedtime. 08/20/18   [provider]  metoprolol tartrate (LOPRESSOR) 50 MG tablet Take 0.5 tablets (25 mg total) by mouth at bedtime. TAKE 1 TABLET AT BEDTIME Patient not taking: Reported on 12/18/2018 07/25/18   Wynn Banker, MD  triamcinolone cream (KENALOG) 0.1 % Compound 1;1 with Eucerin cream and apply bid prn rash. Patient not taking: Reported on 09/01/2018 04/25/18   Kristian Covey, MD    Physical Exam: Vitals:   11/23/19 1800 11/23/19 1850 11/23/19 1937  BP:  (!) 151/78 140/85  Pulse:  78 87  Resp:  18 (!) 25  Temp:  98 F (36.7 C)   TempSrc:  Oral   SpO2:  95% 98%  Weight: 62.4 kg      Constitutional: NAD, calm  Eyes: PERTLA, lids and conjunctivae normal ENMT: Mucous membranes are moist. Posterior pharynx clear of any exudate or lesions.   Neck: normal, supple, no masses, no thyromegaly Respiratory: no wheezing, no crackles. Normal respiratory effort. No accessory muscle use.  Cardiovascular: Regular rate and rhythm. No extremity edema.  Abdomen: No distension, no tenderness, soft. Bowel sounds active.  Musculoskeletal: no clubbing / cyanosis. No joint deformity upper and lower extremities.  Skin: no significant rashes, lesions, ulcers. Warm, dry, well-perfused. Neurologic: No gross facial asymmetry, no dysarthria. Sensation intact. Moving all extremities spontaneously.  Psychiatric: Sleeping, easily woken. Disoriented, follows some instructions. Easily distracted.     Labs on Admission: I have personally reviewed following labs and imaging studies  CBC: Recent Labs  Lab 11/23/19 1849 11/23/19 1850  WBC 6.6  --   NEUTROABS 4.7  --   HGB 16.8* 18.0*  HCT 51.3* 53.0*  MCV 92.9  --   PLT 192  --    Basic Metabolic Panel: Recent Labs  Lab 11/23/19 1849 11/23/19 1850  NA 136 137  K 3.9 3.9  CL 100 99  CO2 24  --   GLUCOSE 154* 154*  BUN 19 23    CREATININE 0.93 0.80  CALCIUM 9.1  --    GFR: CrCl cannot be calculated (Unknown ideal weight.). Liver Function Tests: Recent Labs  Lab 11/23/19 1849  AST 29  ALT 18  ALKPHOS 79  BILITOT 0.6  PROT 7.4  ALBUMIN 3.9   No results for input(s): LIPASE, AMYLASE in the last 168 hours. No results for input(s): AMMONIA in the last 168 hours. Coagulation Profile: Recent Labs  Lab 11/23/19 1849  INR 1.0   Cardiac Enzymes: No results for input(s): CKTOTAL, CKMB, CKMBINDEX, TROPONINI in the last 168 hours. BNP (last 3 results) No results for input(s): PROBNP in the last 8760 hours. HbA1C: No results for input(s): HGBA1C in the last 72 hours. CBG: Recent Labs  Lab 11/23/19 1845  GLUCAP 157*   Lipid Profile: No results for input(s): CHOL, HDL, LDLCALC, TRIG, CHOLHDL, LDLDIRECT in the last 72 hours. Thyroid Function Tests: No results for input(s): TSH, T4TOTAL, FREET4, T3FREE, THYROIDAB in the last  72 hours. Anemia Panel: No results for input(s): VITAMINB12, FOLATE, FERRITIN, TIBC, IRON, RETICCTPCT in the last 72 hours. Urine analysis:    Component Value Date/Time   COLORURINE AMBER (A) 07/04/2015 1925   APPEARANCEUR CLEAR 07/04/2015 1925   LABSPEC >1.046 (H) 07/04/2015 1925   PHURINE 5.5 07/04/2015 1925   GLUCOSEU NEGATIVE 07/04/2015 1925   HGBUR TRACE (A) 07/04/2015 1925   HGBUR trace-intact 05/24/2010 0956   BILIRUBINUR n 08/23/2016 1246   KETONESUR 15 (A) 07/04/2015 1925   PROTEINUR 1+ 08/23/2016 1246   PROTEINUR NEGATIVE 07/04/2015 1925   UROBILINOGEN 0.2 08/23/2016 1246   UROBILINOGEN 1.0 07/04/2015 1925   NITRITE n 08/23/2016 1246   NITRITE NEGATIVE 07/04/2015 1925   LEUKOCYTESUR Negative 08/23/2016 1246   Sepsis Labs: @LABRCNTIP (procalcitonin:4,lacticidven:4) )No results found for this or any previous visit (from the past 240 hour(s)).   Radiological Exams on Admission: CT Code Stroke CTA Head W/WO contrast  Result Date: 11/23/2019 CLINICAL DATA:   Left-sided weakness. EXAM: CT ANGIOGRAPHY HEAD AND NECK CT PERFUSION BRAIN TECHNIQUE: Multidetector CT imaging of the head and neck was performed using the standard protocol during bolus administration of intravenous contrast. Multiplanar CT image reconstructions and MIPs were obtained to evaluate the vascular anatomy. Carotid stenosis measurements (when applicable) are obtained utilizing NASCET criteria, using the distal internal carotid diameter as the denominator. Multiphase CT imaging of the brain was performed following IV bolus contrast injection. Subsequent parametric perfusion maps were calculated using RAPID software. CONTRAST:  128mL OMNIPAQUE IOHEXOL 350 MG/ML SOLN COMPARISON:  None. FINDINGS: CTA NECK FINDINGS Aortic arch: Standard 3 vessel aortic arch with atherosclerotic plaque but no significant arch vessel origin stenosis. Small to moderate volume soft plaque partially visualized in the descending thoracic aorta. Aneurysmal dilatation of the ascending aorta with maximal diameter of 4.3 cm, new from a 2016 chest CT. Right carotid system: Patent with minimal calcified plaque at the carotid bifurcation. No evidence of stenosis or dissection. Left carotid system: Patent with heavily calcified plaque at the carotid bifurcation resulting in less than 50% stenosis of the distal common carotid artery and ICA origin. Vertebral arteries: Patent with the right being mildly dominant. No evidence of significant stenosis or dissection. Skeleton: Mild-to-moderate disc and moderate facet degeneration in the cervical spine. Mild superior endplate compression fractures/Schmorl's node deformities at T2, T3, T4, and T5, all chronic in appearance. Other neck: No evidence of cervical lymphadenopathy or mass. Upper chest: No apical lung consolidation or mass. Review of the MIP images confirms the above findings CTA HEAD FINDINGS Anterior circulation: The internal carotid arteries are patent from skull base to carotid  termini with mild nonstenotic calcified plaque bilaterally. ACAs and MCAs are patent without evidence of proximal branch occlusion or significant A1 or M1 stenosis, however there is prominent branch vessel atherosclerosis including severe stenoses of the M3 and more distal MCA branch vessels as well as distal A2 and more distal ACAs. No aneurysm is identified. Posterior circulation: The intracranial vertebral arteries are widely patent to the basilar. Patent PICA and SCA origins are visualized bilaterally. The basilar artery is widely patent. There are small right and large left posterior communicating arteries with a fetal origin of the left PCA. There are moderate stenoses of the left posterior communicating artery and both P2 segments, and they were severe bilateral P3 stenoses. No aneurysm is identified. Venous sinuses: Poorly evaluated due to contrast timing though grossly patent. Anatomic variants: Low fetal left PCA. Review of the MIP images confirms the above findings CT  Brain Perfusion Findings: ASPECTS: 10 CBF (<30%) Volume: 0mL Perfusion (Tmax>6.0s) volume: 0mL Mismatch Volume: n/a Infarction Location: No infarct evident on CTP. IMPRESSION: 1. No emergent large vessel occlusion, and no evidence of significant ischemia on CTP. 2. Advanced intracranial atherosclerosis primarily involving the distal branch vessels. No flow limiting proximal intracranial stenosis. 3. Left greater than right cervical carotid artery atherosclerosis without significant stenosis. 4. Widely patent vertebral arteries. 5. 4.3 cm ascending aortic aneurysm. Recommend annual imaging followup by CTA or MRA. This recommendation follows 2010 ACCF/AHA/AATS/ACR/ASA/SCA/SCAI/SIR/STS/SVM Guidelines for the Diagnosis and Management of Patients with Thoracic Aortic Disease. Circulation. 2010; 121: Z610-R604. Aortic aneurysm NOS (ICD10-I71.9) 6. Aortic Atherosclerosis (ICD10-I70.0). Preliminary findings of no emergent LVO and negative CTP were  communicated to Dr. Wilford Corner at 7:26 pm on 11/23/2019 by text page via the [facility] messaging system. Electronically Signed   By: Sebastian Ache M.D.   On: 11/23/2019 19:50   CT Code Stroke CTA Neck W/WO contrast  Result Date: 11/23/2019 CLINICAL DATA:  Left-sided weakness. EXAM: CT ANGIOGRAPHY HEAD AND NECK CT PERFUSION BRAIN TECHNIQUE: Multidetector CT imaging of the head and neck was performed using the standard protocol during bolus administration of intravenous contrast. Multiplanar CT image reconstructions and MIPs were obtained to evaluate the vascular anatomy. Carotid stenosis measurements (when applicable) are obtained utilizing NASCET criteria, using the distal internal carotid diameter as the denominator. Multiphase CT imaging of the brain was performed following IV bolus contrast injection. Subsequent parametric perfusion maps were calculated using RAPID software. CONTRAST:  OMNIPAQUE IOHEXOL 350 MG/ML SOLN COMPARISON:  None. FINDINGS: CTA NECK FINDINGS Aortic arch: Standard 3 vessel aortic arch with atherosclerotic plaque but no significant arch vessel origin stenosis. Small to moderate volume soft plaque partially visualized in the descending thoracic aorta. Aneurysmal dilatation of the ascending aorta with maximal diameter of 4.3 cm, new from a 2016 chest CT. Right carotid system: Patent with minimal calcified plaque at the carotid bifurcation. No evidence of stenosis or dissection. Left carotid system: Patent with heavily calcified plaque at the carotid bifurcation resulting in less than 50% stenosis of the distal common carotid artery and ICA origin. Vertebral arteries: Patent with the right being mildly dominant. No evidence of significant stenosis or dissection. Skeleton: Mild-to-moderate disc and moderate facet degeneration in the cervical spine. Mild superior endplate compression fractures/Schmorl's node deformities at T2, T3, T4, and T5, all chronic in appearance. Other neck: No evidence of  cervical lymphadenopathy or mass. Upper chest: No apical lung consolidation or mass. Review of the MIP images confirms the above findings CTA HEAD FINDINGS Anterior circulation: The internal carotid arteries are patent from skull base to carotid termini with mild nonstenotic calcified plaque bilaterally. ACAs and MCAs are patent without evidence of proximal branch occlusion or significant A1 or M1 stenosis, however there is prominent branch vessel atherosclerosis including severe stenoses of the M3 and more distal MCA branch vessels as well as distal A2 and more distal ACAs. No aneurysm is identified. Posterior circulation: The intracranial vertebral arteries are widely patent to the basilar. Patent PICA and SCA origins are visualized bilaterally. The basilar artery is widely patent. There are small right and large left posterior communicating arteries with a fetal origin of the left PCA. There are moderate stenoses of the left posterior communicating artery and both P2 segments, and they were severe bilateral P3 stenoses. No aneurysm is identified. Venous sinuses: Poorly evaluated due to contrast timing though grossly patent. Anatomic variants: Low fetal left PCA. Review of the MIP images  confirms the above findings CT Brain Perfusion Findings: ASPECTS: 10 CBF (<30%) Volume: 0mL Perfusion (Tmax>6.0s) volume: 0mL Mismatch Volume: n/a Infarction Location: No infarct evident on CTP. IMPRESSION: 1. No emergent large vessel occlusion, and no evidence of significant ischemia on CTP. 2. Advanced intracranial atherosclerosis primarily involving the distal branch vessels. No flow limiting proximal intracranial stenosis. 3. Left greater than right cervical carotid artery atherosclerosis without significant stenosis. 4. Widely patent vertebral arteries. 5. 4.3 cm ascending aortic aneurysm. Recommend annual imaging followup by CTA or MRA. This recommendation follows 2010 ACCF/AHA/AATS/ACR/ASA/SCA/SCAI/SIR/STS/SVM Guidelines  for the Diagnosis and Management of Patients with Thoracic Aortic Disease. Circulation. 2010; 121: W960-A540. Aortic aneurysm NOS (ICD10-I71.9) 6. Aortic Atherosclerosis (ICD10-I70.0). Preliminary findings of no emergent LVO and negative CTP were communicated to Dr. Wilford Corner at 7:26 pm on 11/23/2019 by text page via the [facility] messaging system. Electronically Signed   By: Sebastian Ache M.D.   On: 11/23/2019 19:50   CT Code Stroke Cerebral Perfusion with contrast  Result Date: 11/23/2019 CLINICAL DATA:  Left-sided weakness. EXAM: CT ANGIOGRAPHY HEAD AND NECK CT PERFUSION BRAIN TECHNIQUE: Multidetector CT imaging of the head and neck was performed using the standard protocol during bolus administration of intravenous contrast. Multiplanar CT image reconstructions and MIPs were obtained to evaluate the vascular anatomy. Carotid stenosis measurements (when applicable) are obtained utilizing NASCET criteria, using the distal internal carotid diameter as the denominator. Multiphase CT imaging of the brain was performed following IV bolus contrast injection. Subsequent parametric perfusion maps were calculated using RAPID software. CONTRAST:  OMNIPAQUE IOHEXOL 350 MG/ML SOLN COMPARISON:  None. FINDINGS: CTA NECK FINDINGS Aortic arch: Standard 3 vessel aortic arch with atherosclerotic plaque but no significant arch vessel origin stenosis. Small to moderate volume soft plaque partially visualized in the descending thoracic aorta. Aneurysmal dilatation of the ascending aorta with maximal diameter of 4.3 cm, new from a 2016 chest CT. Right carotid system: Patent with minimal calcified plaque at the carotid bifurcation. No evidence of stenosis or dissection. Left carotid system: Patent with heavily calcified plaque at the carotid bifurcation resulting in less than 50% stenosis of the distal common carotid artery and ICA origin. Vertebral arteries: Patent with the right being mildly dominant. No evidence of significant  stenosis or dissection. Skeleton: Mild-to-moderate disc and moderate facet degeneration in the cervical spine. Mild superior endplate compression fractures/Schmorl's node deformities at T2, T3, T4, and T5, all chronic in appearance. Other neck: No evidence of cervical lymphadenopathy or mass. Upper chest: No apical lung consolidation or mass. Review of the MIP images confirms the above findings CTA HEAD FINDINGS Anterior circulation: The internal carotid arteries are patent from skull base to carotid termini with mild nonstenotic calcified plaque bilaterally. ACAs and MCAs are patent without evidence of proximal branch occlusion or significant A1 or M1 stenosis, however there is prominent branch vessel atherosclerosis including severe stenoses of the M3 and more distal MCA branch vessels as well as distal A2 and more distal ACAs. No aneurysm is identified. Posterior circulation: The intracranial vertebral arteries are widely patent to the basilar. Patent PICA and SCA origins are visualized bilaterally. The basilar artery is widely patent. There are small right and large left posterior communicating arteries with a fetal origin of the left PCA. There are moderate stenoses of the left posterior communicating artery and both P2 segments, and they were severe bilateral P3 stenoses. No aneurysm is identified. Venous sinuses: Poorly evaluated due to contrast timing though grossly patent. Anatomic variants: Low fetal left PCA.  Review of the MIP images confirms the above findings CT Brain Perfusion Findings: ASPECTS: 10 CBF (<30%) Volume: 0mL Perfusion (Tmax>6.0s) volume: 0mL Mismatch Volume: n/a Infarction Location: No infarct evident on CTP. IMPRESSION: 1. No emergent large vessel occlusion, and no evidence of significant ischemia on CTP. 2. Advanced intracranial atherosclerosis primarily involving the distal branch vessels. No flow limiting proximal intracranial stenosis. 3. Left greater than right cervical carotid  artery atherosclerosis without significant stenosis. 4. Widely patent vertebral arteries. 5. 4.3 cm ascending aortic aneurysm. Recommend annual imaging followup by CTA or MRA. This recommendation follows 2010 ACCF/AHA/AATS/ACR/ASA/SCA/SCAI/SIR/STS/SVM Guidelines for the Diagnosis and Management of Patients with Thoracic Aortic Disease. Circulation. 2010; 121: Z610-R604. Aortic aneurysm NOS (ICD10-I71.9) 6. Aortic Atherosclerosis (ICD10-I70.0). Preliminary findings of no emergent LVO and negative CTP were communicated to Dr. Wilford Corner at 7:26 pm on 11/23/2019 by text page via the [facility] messaging system. Electronically Signed   By: Sebastian Ache M.D.   On: 11/23/2019 19:50   DG Chest Portable 1 View  Result Date: 11/23/2019 CLINICAL DATA:  Acute shortness of breath EXAM: PORTABLE CHEST 1 VIEW COMPARISON:  07/06/2015 chest radiograph FINDINGS: UPPER limits normal heart size again noted. LEFT retrocardiac opacity appears similar to remote radiograph, but may represent atelectasis, scarring or airspace disease/consolidation. No definite pleural effusion or pneumothorax. No acute bony abnormalities. RIGHT shoulder arthroplasty changes noted. IMPRESSION: LEFT retrocardiac opacity which appears somewhat remote radiograph, but may represent atelectasis, scarring or airspace disease/consolidation. Electronically Signed   By: Harmon Pier M.D.   On: 11/23/2019 20:13   CT HEAD CODE STROKE WO CONTRAST  Result Date: 11/23/2019 CLINICAL DATA:  Code stroke. Left-sided weakness and slurred speech. EXAM: CT HEAD WITHOUT CONTRAST TECHNIQUE: Contiguous axial images were obtained from the base of the skull through the vertex without intravenous contrast. COMPARISON:  12/18/2018 FINDINGS: Brain: There is no evidence of acute infarct, intracranial hemorrhage, mass, midline shift, or extra-axial fluid collection. Patchy to confluent hypodensities in the cerebral white matter bilaterally are unchanged and nonspecific but compatible with  moderate chronic small vessel ischemic disease. Mild-to-moderate lateral and third ventriculomegaly is unchanged and may reflect central predominant cerebral atrophy although normal pressure hydrocephalus is not excluded in the appropriate clinical setting. Vascular: Calcified atherosclerosis at the skull base. No hyperdense vessel. Skull: No fracture or focal osseous lesion. Sinuses/Orbits: Left greater than right maxillary sinus mucosal thickening with moderate volume fluid on the left. Mild bilateral ethmoid air cell mucosal thickening. Clear mastoid air cells bilateral cataract extraction. Other: None. ASPECTS [facility] Stroke Program Early CT Score) - Ganglionic level infarction (caudate, lentiform nuclei, internal capsule, insula, M1-M3 cortex): 7 - Supraganglionic infarction (M4-M6 cortex): 3 Total score (0-10 with 10 being normal): 10 IMPRESSION: 1. No evidence of acute intracranial abnormality. 2. ASPECTS is 10. 3. Moderate chronic small vessel ischemic disease and unchanged ventriculomegaly. These results were communicated to Dr. Laurence Slate at 7:02 pm on 11/23/2019 by text page via the [facility] messaging system. Electronically Signed   By: Sebastian Ache M.D.   On: 11/23/2019 19:02    EKG: Ordered, not yet performed.   Assessment/Plan   1. Acute encephalopathy, ?left-sided deficits  - Presents from memory unit at her nursing home with increased confusion and possible left-sided weakness, noted to have subtle left lower facial weakness in ED with no acute findings on head CT, no emergent LVO on CTA H&N, and no evidence for significant ischemia on CT perfusion study  - Neurology consulting and much appreciated, recommending encephalopathy workup and MRI brain  -  Continue neuro checks, check B12, ammonia, urinalysis, and TSH, check MRI brain    2. Dementia  - Family reported to ED staff that patient is oriented to self only and ambulates with a walker at baseline  - Continue Aricept   3. Hypertension  -  BP at goal, continue metoprolol as tolerated   4. Anxiety  - Continue Celexa    5. Ascending aortic aneurysm  - 4.3 cm aneurysm noted incidentally in ED  - Annual follow-up imaging recommended   DVT prophylaxis: Lovenox  Code Status: Full for now, unable to reach family at time of admission and patient disoriented  Family Communication: Unable to reach family at time of admission   Consults called: Neurology  Admission status: Inpatient     Briscoe Deutscher, MD [facility] Pager [phone]  If 7PM-7AM, please contact night-coverage [email or web address] Password [email or web address]  11/23/2019, 8:59 PM

## 2019-11-23 NOTE — ED Notes (Signed)
Neurologist spoke with patients son Sherren Mocha, states patient has been ambulating with walker for most of this year and patient also has frequent UTIS.

## 2019-11-23 NOTE — ED Provider Notes (Signed)
[facility] EMERGENCY DEPARTMENT Provider Note   CSN: [id number] Arrival date & time: 11/23/19  1842  An emergency department physician performed an initial assessment on this suspected stroke patient at 1842.  History No chief complaint on file.   Shirley [name] is a 85 y.o. female.  HPI EVALIE HARGRAVES is a 85 y.o. female with a medical history of arthritis, htn, osteoarthritis who presents to the ED by ems from memory care unit after staff around 6 pm noticed left sided weakness and slurred speech, ecndoded as a code stroke. Airway cleared on arrival, brought to the scanner with neurology at bedside. Patient is unable to provide history, provided by ems and records. At baseline, patient uses a walker to walk-confirmed by the son, at least since March.  She is able to have some conversations but most of her conversations are loopy according to the son and rooted in the past and she has no orientation to the current time place or situation at baseline.       Past Medical History:  Diagnosis Date  . Arthritis    "right shoulder; that's why I had to have it replaced"  . Hypertension   . Osteoarthritis   . Osteoporosis   . PMR (polymyalgia rheumatica) [facility])     Patient Active Problem List   Diagnosis Date Noted  . Acute encephalopathy 11/23/2019  . Ascending aortic aneurysm (HCC) 11/23/2019  . Moderate dementia (HCC) 08/10/2015  . Frequent falls 05/08/2015  . Anxiety 05/08/2015  . Anxiety state, unspecified 03/06/2014  . Short-term memory loss 12/30/2013  . Lumbar degenerative disc disease 08/22/2011  . Polymyalgia rheumatica (HCC) 05/16/2008  . Essential hypertension 03/12/2008  . Arthritis, senescent 03/12/2008  . MYALGIA 03/12/2008  . OSTEOPOROSIS 03/12/2008    Past Surgical History:  Procedure Laterality Date  . CATARACT EXTRACTION W/ INTRAOCULAR LENS  IMPLANT, BILATERAL     2008   . HIP PINNING,CANNULATED Right 10/20/2013   Procedure:  CANNULATED HIP PINNING;  Surgeon: Eldred Manges, MD;  Location: [facility] ORS;  Service: Orthopedics;  Laterality: Right;  . KYPHOPLASTY N/A 02/28/2013   Procedure: KYPHOPLASTY;  Surgeon: Cristi Loron, MD;  Location: [facility] NEURO ORS;  Service: Neurosurgery;  Laterality: N/A;  Lumbar two Kyphoplasty  . POSTERIOR FUSION LUMBAR SPINE  03/28/12  . SHOULDER ARTHROSCOPY W/ ROTATOR CUFF REPAIR  ~ 01/2011   right  . TOTAL SHOULDER REPLACEMENT  06/2011   right  . VAGINAL HYSTERECTOMY  1970's     OB History    Gravida  3   Para      Term      Preterm      AB      Living        SAB      TAB      Ectopic      Multiple      Live Births              Family History  Problem Relation Age of Onset  . ALS Father   . Hypertension Sister     Social History   Tobacco Use  . Smoking status: Former Smoker    Packs/Irving Lubbers: 0.50    Years: 40.00    Pack years: 20.00    Types: Cigarettes    Quit date: 12/06/1991    Years since quitting: 27.9  . Smokeless tobacco: Never Used  Substance Use Topics  . Alcohol use: Yes    Alcohol/week: 0.0 standard drinks  Comment: occ  . Drug use: No    Home Medications Prior to Admission medications   Medication Sig Start Date End Date Taking? Authorizing Provider  citalopram (CELEXA) 10 MG tablet TAKE 2 TABLETS EVERY Alyah Boehning Patient taking differently: Take 20 mg by mouth See admin instructions. Take one tablet by mouth on Monday, Wednesday and Fridays. 04/17/18  Yes Roderick Peeodd, Jeffrey A, MD  donepezil (ARICEPT) 10 MG tablet TAKE 1 TABLET EVERY Romilda Proby Patient taking differently: Take 10 mg by mouth at bedtime.  11/29/17  Yes Van ClinesAquino, Karen M, MD  metoprolol succinate (TOPROL-XL) 25 MG 24 hr tablet Take 25 mg by mouth at bedtime. 08/20/18  Yes [provider]  triamcinolone cream (KENALOG) 0.1 % Compound 1;1 with Eucerin cream and apply bid prn rash. Patient taking differently: Apply 1 application topically 2 (two) times daily. Apply to arms and back 04/25/18   Yes Burchette, Elberta FortisBruce W, MD    Allergies    Patient has no known allergies.  Review of Systems   Review of Systems  Physical Exam Updated Vital Signs BP (!) 153/99 (BP Location: Right Arm)   Pulse 85   Temp 98 F (36.7 C) (Oral)   Resp 14   Wt 61.2 kg   SpO2 91%   BMI 22.45 kg/m   Physical Exam  ED Results / Procedures / Treatments   Labs (all labs ordered are listed, but only abnormal results are displayed) Labs Reviewed  CBC - Abnormal; Notable for the following components:      Result Value   RBC 5.52 (*)    Hemoglobin 16.8 (*)    HCT 51.3 (*)    All other components within normal limits  DIFFERENTIAL - Abnormal; Notable for the following components:   Lymphs Abs 0.6 (*)    Monocytes Absolute 1.2 (*)    All other components within normal limits  COMPREHENSIVE METABOLIC PANEL - Abnormal; Notable for the following components:   Glucose, Bld 154 (*)    GFR calc non Af Amer 57 (*)    All other components within normal limits  VITAMIN B12 - Abnormal; Notable for the following components:   Vitamin B-12 171 (*)    All other components within normal limits  BASIC METABOLIC PANEL - Abnormal; Notable for the following components:   CO2 21 (*)    Glucose, Bld 105 (*)    Calcium 8.7 (*)    All other components within normal limits  CBC - Abnormal; Notable for the following components:   RBC 5.15 (*)    Hemoglobin 15.7 (*)    HCT 47.7 (*)    All other components within normal limits  I-STAT CHEM 8, ED - Abnormal; Notable for the following components:   Glucose, Bld 154 (*)    Calcium, Ion 1.05 (*)    Hemoglobin 18.0 (*)    HCT 53.0 (*)    All other components within normal limits  CBG MONITORING, ED - Abnormal; Notable for the following components:   Glucose-Capillary 157 (*)    All other components within normal limits  SARS CORONAVIRUS 2 (TAT 6-24 HRS)  PROTIME-INR  APTT  TSH  AMMONIA  GLUCOSE, CAPILLARY  URINALYSIS, ROUTINE W REFLEX MICROSCOPIC     EKG None  Radiology CT Code Stroke CTA Head W/WO contrast  Result Date: 11/23/2019 CLINICAL DATA:  Left-sided weakness. EXAM: CT ANGIOGRAPHY HEAD AND NECK CT PERFUSION BRAIN TECHNIQUE: Multidetector CT imaging of the head and neck was performed using the standard protocol during bolus  administration of intravenous contrast. Multiplanar CT image reconstructions and MIPs were obtained to evaluate the vascular anatomy. Carotid stenosis measurements (when applicable) are obtained utilizing NASCET criteria, using the distal internal carotid diameter as the denominator. Multiphase CT imaging of the brain was performed following IV bolus contrast injection. Subsequent parametric perfusion maps were calculated using RAPID software. CONTRAST:  OMNIPAQUE IOHEXOL 350 MG/ML SOLN COMPARISON:  None. FINDINGS: CTA NECK FINDINGS Aortic arch: Standard 3 vessel aortic arch with atherosclerotic plaque but no significant arch vessel origin stenosis. Small to moderate volume soft plaque partially visualized in the descending thoracic aorta. Aneurysmal dilatation of the ascending aorta with maximal diameter of 4.3 cm, new from a 2016 chest CT. Right carotid system: Patent with minimal calcified plaque at the carotid bifurcation. No evidence of stenosis or dissection. Left carotid system: Patent with heavily calcified plaque at the carotid bifurcation resulting in less than 50% stenosis of the distal common carotid artery and ICA origin. Vertebral arteries: Patent with the right being mildly dominant. No evidence of significant stenosis or dissection. Skeleton: Mild-to-moderate disc and moderate facet degeneration in the cervical spine. Mild superior endplate compression fractures/Schmorl's node deformities at T2, T3, T4, and T5, all chronic in appearance. Other neck: No evidence of cervical lymphadenopathy or mass. Upper chest: No apical lung consolidation or mass. Review of the MIP images confirms the above findings  CTA HEAD FINDINGS Anterior circulation: The internal carotid arteries are patent from skull base to carotid termini with mild nonstenotic calcified plaque bilaterally. ACAs and MCAs are patent without evidence of proximal branch occlusion or significant A1 or M1 stenosis, however there is prominent branch vessel atherosclerosis including severe stenoses of the M3 and more distal MCA branch vessels as well as distal A2 and more distal ACAs. No aneurysm is identified. Posterior circulation: The intracranial vertebral arteries are widely patent to the basilar. Patent PICA and SCA origins are visualized bilaterally. The basilar artery is widely patent. There are small right and large left posterior communicating arteries with a fetal origin of the left PCA. There are moderate stenoses of the left posterior communicating artery and both P2 segments, and they were severe bilateral P3 stenoses. No aneurysm is identified. Venous sinuses: Poorly evaluated due to contrast timing though grossly patent. Anatomic variants: Low fetal left PCA. Review of the MIP images confirms the above findings CT Brain Perfusion Findings: ASPECTS: 10 CBF (<30%) Volume: 0mL Perfusion (Tmax>6.0s) volume: 0mL Mismatch Volume: n/a Infarction Location: No infarct evident on CTP. IMPRESSION: 1. No emergent large vessel occlusion, and no evidence of significant ischemia on CTP. 2. Advanced intracranial atherosclerosis primarily involving the distal branch vessels. No flow limiting proximal intracranial stenosis. 3. Left greater than right cervical carotid artery atherosclerosis without significant stenosis. 4. Widely patent vertebral arteries. 5. 4.3 cm ascending aortic aneurysm. Recommend annual imaging followup by CTA or MRA. This recommendation follows 2010 ACCF/AHA/AATS/ACR/ASA/SCA/SCAI/SIR/STS/SVM Guidelines for the Diagnosis and Management of Patients with Thoracic Aortic Disease. Circulation. 2010; 121: A213-Y865. Aortic aneurysm NOS  (ICD10-I71.9) 6. Aortic Atherosclerosis (ICD10-I70.0). Preliminary findings of no emergent LVO and negative CTP were communicated to Dr. Wilford Corner at 7:26 pm on 11/23/2019 by text page via the [facility] messaging system. Electronically Signed   By: Sebastian Ache M.D.   On: 11/23/2019 19:50   CT Code Stroke CTA Neck W/WO contrast  Result Date: 11/23/2019 CLINICAL DATA:  Left-sided weakness. EXAM: CT ANGIOGRAPHY HEAD AND NECK CT PERFUSION BRAIN TECHNIQUE: Multidetector CT imaging of the head and neck was performed using  the standard protocol during bolus administration of intravenous contrast. Multiplanar CT image reconstructions and MIPs were obtained to evaluate the vascular anatomy. Carotid stenosis measurements (when applicable) are obtained utilizing NASCET criteria, using the distal internal carotid diameter as the denominator. Multiphase CT imaging of the brain was performed following IV bolus contrast injection. Subsequent parametric perfusion maps were calculated using RAPID software. CONTRAST:  OMNIPAQUE IOHEXOL 350 MG/ML SOLN COMPARISON:  None. FINDINGS: CTA NECK FINDINGS Aortic arch: Standard 3 vessel aortic arch with atherosclerotic plaque but no significant arch vessel origin stenosis. Small to moderate volume soft plaque partially visualized in the descending thoracic aorta. Aneurysmal dilatation of the ascending aorta with maximal diameter of 4.3 cm, new from a 2016 chest CT. Right carotid system: Patent with minimal calcified plaque at the carotid bifurcation. No evidence of stenosis or dissection. Left carotid system: Patent with heavily calcified plaque at the carotid bifurcation resulting in less than 50% stenosis of the distal common carotid artery and ICA origin. Vertebral arteries: Patent with the right being mildly dominant. No evidence of significant stenosis or dissection. Skeleton: Mild-to-moderate disc and moderate facet degeneration in the cervical spine. Mild superior endplate  compression fractures/Schmorl's node deformities at T2, T3, T4, and T5, all chronic in appearance. Other neck: No evidence of cervical lymphadenopathy or mass. Upper chest: No apical lung consolidation or mass. Review of the MIP images confirms the above findings CTA HEAD FINDINGS Anterior circulation: The internal carotid arteries are patent from skull base to carotid termini with mild nonstenotic calcified plaque bilaterally. ACAs and MCAs are patent without evidence of proximal branch occlusion or significant A1 or M1 stenosis, however there is prominent branch vessel atherosclerosis including severe stenoses of the M3 and more distal MCA branch vessels as well as distal A2 and more distal ACAs. No aneurysm is identified. Posterior circulation: The intracranial vertebral arteries are widely patent to the basilar. Patent PICA and SCA origins are visualized bilaterally. The basilar artery is widely patent. There are small right and large left posterior communicating arteries with a fetal origin of the left PCA. There are moderate stenoses of the left posterior communicating artery and both P2 segments, and they were severe bilateral P3 stenoses. No aneurysm is identified. Venous sinuses: Poorly evaluated due to contrast timing though grossly patent. Anatomic variants: Low fetal left PCA. Review of the MIP images confirms the above findings CT Brain Perfusion Findings: ASPECTS: 10 CBF (<30%) Volume: 0mL Perfusion (Tmax>6.0s) volume: 0mL Mismatch Volume: n/a Infarction Location: No infarct evident on CTP. IMPRESSION: 1. No emergent large vessel occlusion, and no evidence of significant ischemia on CTP. 2. Advanced intracranial atherosclerosis primarily involving the distal branch vessels. No flow limiting proximal intracranial stenosis. 3. Left greater than right cervical carotid artery atherosclerosis without significant stenosis. 4. Widely patent vertebral arteries. 5. 4.3 cm ascending aortic aneurysm. Recommend  annual imaging followup by CTA or MRA. This recommendation follows 2010 ACCF/AHA/AATS/ACR/ASA/SCA/SCAI/SIR/STS/SVM Guidelines for the Diagnosis and Management of Patients with Thoracic Aortic Disease. Circulation. 2010; 121: R604-V409. Aortic aneurysm NOS (ICD10-I71.9) 6. Aortic Atherosclerosis (ICD10-I70.0). Preliminary findings of no emergent LVO and negative CTP were communicated to Dr. Wilford Corner at 7:26 pm on 11/23/2019 by text page via the [facility] messaging system. Electronically Signed   By: Sebastian Ache M.D.   On: 11/23/2019 19:50   CT Code Stroke Cerebral Perfusion with contrast  Result Date: 11/23/2019 CLINICAL DATA:  Left-sided weakness. EXAM: CT ANGIOGRAPHY HEAD AND NECK CT PERFUSION BRAIN TECHNIQUE: Multidetector CT imaging of the head  and neck was performed using the standard protocol during bolus administration of intravenous contrast. Multiplanar CT image reconstructions and MIPs were obtained to evaluate the vascular anatomy. Carotid stenosis measurements (when applicable) are obtained utilizing NASCET criteria, using the distal internal carotid diameter as the denominator. Multiphase CT imaging of the brain was performed following IV bolus contrast injection. Subsequent parametric perfusion maps were calculated using RAPID software. CONTRAST:  OMNIPAQUE IOHEXOL 350 MG/ML SOLN COMPARISON:  None. FINDINGS: CTA NECK FINDINGS Aortic arch: Standard 3 vessel aortic arch with atherosclerotic plaque but no significant arch vessel origin stenosis. Small to moderate volume soft plaque partially visualized in the descending thoracic aorta. Aneurysmal dilatation of the ascending aorta with maximal diameter of 4.3 cm, new from a 2016 chest CT. Right carotid system: Patent with minimal calcified plaque at the carotid bifurcation. No evidence of stenosis or dissection. Left carotid system: Patent with heavily calcified plaque at the carotid bifurcation resulting in less than 50% stenosis of the distal common  carotid artery and ICA origin. Vertebral arteries: Patent with the right being mildly dominant. No evidence of significant stenosis or dissection. Skeleton: Mild-to-moderate disc and moderate facet degeneration in the cervical spine. Mild superior endplate compression fractures/Schmorl's node deformities at T2, T3, T4, and T5, all chronic in appearance. Other neck: No evidence of cervical lymphadenopathy or mass. Upper chest: No apical lung consolidation or mass. Review of the MIP images confirms the above findings CTA HEAD FINDINGS Anterior circulation: The internal carotid arteries are patent from skull base to carotid termini with mild nonstenotic calcified plaque bilaterally. ACAs and MCAs are patent without evidence of proximal branch occlusion or significant A1 or M1 stenosis, however there is prominent branch vessel atherosclerosis including severe stenoses of the M3 and more distal MCA branch vessels as well as distal A2 and more distal ACAs. No aneurysm is identified. Posterior circulation: The intracranial vertebral arteries are widely patent to the basilar. Patent PICA and SCA origins are visualized bilaterally. The basilar artery is widely patent. There are small right and large left posterior communicating arteries with a fetal origin of the left PCA. There are moderate stenoses of the left posterior communicating artery and both P2 segments, and they were severe bilateral P3 stenoses. No aneurysm is identified. Venous sinuses: Poorly evaluated due to contrast timing though grossly patent. Anatomic variants: Low fetal left PCA. Review of the MIP images confirms the above findings CT Brain Perfusion Findings: ASPECTS: 10 CBF (<30%) Volume: 0mL Perfusion (Tmax>6.0s) volume: 0mL Mismatch Volume: n/a Infarction Location: No infarct evident on CTP. IMPRESSION: 1. No emergent large vessel occlusion, and no evidence of significant ischemia on CTP. 2. Advanced intracranial atherosclerosis primarily involving  the distal branch vessels. No flow limiting proximal intracranial stenosis. 3. Left greater than right cervical carotid artery atherosclerosis without significant stenosis. 4. Widely patent vertebral arteries. 5. 4.3 cm ascending aortic aneurysm. Recommend annual imaging followup by CTA or MRA. This recommendation follows 2010 ACCF/AHA/AATS/ACR/ASA/SCA/SCAI/SIR/STS/SVM Guidelines for the Diagnosis and Management of Patients with Thoracic Aortic Disease. Circulation. 2010; 121: W295-A213. Aortic aneurysm NOS (ICD10-I71.9) 6. Aortic Atherosclerosis (ICD10-I70.0). Preliminary findings of no emergent LVO and negative CTP were communicated to Dr. Wilford Corner at 7:26 pm on 11/23/2019 by text page via the [facility] messaging system. Electronically Signed   By: Sebastian Ache M.D.   On: 11/23/2019 19:50   DG Chest Portable 1 View  Result Date: 11/23/2019 CLINICAL DATA:  Acute shortness of breath EXAM: PORTABLE CHEST 1 VIEW COMPARISON:  07/06/2015 chest radiograph FINDINGS:  UPPER limits normal heart size again noted. LEFT retrocardiac opacity appears similar to remote radiograph, but may represent atelectasis, scarring or airspace disease/consolidation. No definite pleural effusion or pneumothorax. No acute bony abnormalities. RIGHT shoulder arthroplasty changes noted. IMPRESSION: LEFT retrocardiac opacity which appears somewhat remote radiograph, but may represent atelectasis, scarring or airspace disease/consolidation. Electronically Signed   By: Harmon Pier M.D.   On: 11/23/2019 20:13   CT HEAD CODE STROKE WO CONTRAST  Result Date: 11/23/2019 CLINICAL DATA:  Code stroke. Left-sided weakness and slurred speech. EXAM: CT HEAD WITHOUT CONTRAST TECHNIQUE: Contiguous axial images were obtained from the base of the skull through the vertex without intravenous contrast. COMPARISON:  12/18/2018 FINDINGS: Brain: There is no evidence of acute infarct, intracranial hemorrhage, mass, midline shift, or extra-axial fluid collection.  Patchy to confluent hypodensities in the cerebral white matter bilaterally are unchanged and nonspecific but compatible with moderate chronic small vessel ischemic disease. Mild-to-moderate lateral and third ventriculomegaly is unchanged and may reflect central predominant cerebral atrophy although normal pressure hydrocephalus is not excluded in the appropriate clinical setting. Vascular: Calcified atherosclerosis at the skull base. No hyperdense vessel. Skull: No fracture or focal osseous lesion. Sinuses/Orbits: Left greater than right maxillary sinus mucosal thickening with moderate volume fluid on the left. Mild bilateral ethmoid air cell mucosal thickening. Clear mastoid air cells bilateral cataract extraction. Other: None. ASPECTS [facility] Stroke Program Early CT Score) - Ganglionic level infarction (caudate, lentiform nuclei, internal capsule, insula, M1-M3 cortex): 7 - Supraganglionic infarction (M4-M6 cortex): 3 Total score (0-10 with 10 being normal): 10 IMPRESSION: 1. No evidence of acute intracranial abnormality. 2. ASPECTS is 10. 3. Moderate chronic small vessel ischemic disease and unchanged ventriculomegaly. These results were communicated to Dr. Laurence Slate at 7:02 pm on 11/23/2019 by text page via the [facility] messaging system. Electronically Signed   By: Sebastian Ache M.D.   On: 11/23/2019 19:02    Procedures Procedures (including critical care time)  Medications Ordered in ED Medications  sodium chloride flush (NS) 0.9 % injection 3 mL (has no administration in time range)  enoxaparin (LOVENOX) injection 40 mg (has no administration in time range)  sodium chloride flush (NS) 0.9 % injection 3 mL (3 mLs Intravenous Given 11/24/19 1100)  sodium chloride flush (NS) 0.9 % injection 3 mL (has no administration in time range)  sodium chloride flush (NS) 0.9 % injection 3 mL (has no administration in time range)  0.9 %  sodium chloride infusion (has no administration in time range)  acetaminophen  (TYLENOL) tablet 650 mg (has no administration in time range)    Or  acetaminophen (TYLENOL) suppository 650 mg (has no administration in time range)  senna-docusate (Senokot-S) tablet 1 tablet (has no administration in time range)  ondansetron (ZOFRAN) tablet 4 mg (has no administration in time range)    Or  ondansetron (ZOFRAN) injection 4 mg (has no administration in time range)  0.9 %  sodium chloride infusion ( Intravenous Stopped 11/24/19 0440)  metoprolol tartrate (LOPRESSOR) tablet 25 mg (25 mg Oral Not Given 11/24/19 1249)  citalopram (CELEXA) tablet 10 mg (10 mg Oral Not Given 11/24/19 1100)  donepezil (ARICEPT) tablet 10 mg (10 mg Oral Not Given 11/24/19 0018)  iohexol (OMNIPAQUE) 350 MG/ML injection 100 mL (100 mLs Intravenous Contrast Given 11/23/19 1918)    ED Course  I have reviewed the triage vital signs and the nursing notes.  Pertinent labs & imaging results that were available during my care of the patient were reviewed by me  and considered in my medical decision making (see chart for details).    MDM Rules/Calculators/A&P                      MILLER EDGINGTON is a 85 y.o. female with a medical history of arthritis, htn, osteoarthritis who presents to the ED by ems from memory care unit after staff around 6 pm noticed left sided weakness and slurred speech, ecndoded as a code stroke. Airway cleared on arrival, brought to the scanner with neurology at bedside. Patient is unable to provide history, provided by ems and records.  HPI and physical exam as above. She presents awake, pleasantly confused, non toxic, hemodynamically stable, afebrile. ED course: Code stroke imaging negative, neurology has recommend mri, observation and screening workup for infection.  No evidence of intracranial hemorrhage. Her laboratory work is reassuring, low suspicion for infectious process. Plan: Discussed patient with hospitalist, neurology will follow her care for observation admission and  further workup with mri. I discussed this plan with the patient and family, they understand and agree with plan for admission.   Final Clinical Impression(s) / ED Diagnoses Final diagnoses:  Altered mental status, unspecified altered mental status type    Rx / DC Orders ED Discharge Orders    None       Johnda Billiot, Lovena Le, MD 11/24/19 1449    Virgel Manifold, MD 12/01/19 [phone]

## 2019-11-23 NOTE — ED Triage Notes (Signed)
Per [facility] pt coming from [facility] memory care unit as code stroke. Per staff patient had left sided facial droop, left leg weakness, intermittent slurred speech and increased confusion onset of 1800. Patient at baseline is only alert to self. Staff called by neurologist and having difficulty receiving actual last known normal by staff. Patient alert on arrival.

## 2019-11-24 ENCOUNTER — Observation Stay (HOSPITAL_COMMUNITY): Payer: Medicare PPO

## 2019-11-24 DIAGNOSIS — G92 Toxic encephalopathy: Secondary | ICD-10-CM | POA: Diagnosis present

## 2019-11-24 DIAGNOSIS — D751 Secondary polycythemia: Secondary | ICD-10-CM | POA: Diagnosis present

## 2019-11-24 DIAGNOSIS — G8194 Hemiplegia, unspecified affecting left nondominant side: Secondary | ICD-10-CM | POA: Diagnosis present

## 2019-11-24 DIAGNOSIS — E538 Deficiency of other specified B group vitamins: Secondary | ICD-10-CM | POA: Diagnosis present

## 2019-11-24 DIAGNOSIS — Z981 Arthrodesis status: Secondary | ICD-10-CM | POA: Diagnosis not present

## 2019-11-24 DIAGNOSIS — Z20828 Contact with and (suspected) exposure to other viral communicable diseases: Secondary | ICD-10-CM | POA: Diagnosis present

## 2019-11-24 DIAGNOSIS — Z7189 Other specified counseling: Secondary | ICD-10-CM | POA: Diagnosis not present

## 2019-11-24 DIAGNOSIS — R296 Repeated falls: Secondary | ICD-10-CM | POA: Diagnosis present

## 2019-11-24 DIAGNOSIS — Z66 Do not resuscitate: Secondary | ICD-10-CM | POA: Diagnosis not present

## 2019-11-24 DIAGNOSIS — Z96611 Presence of right artificial shoulder joint: Secondary | ICD-10-CM | POA: Diagnosis present

## 2019-11-24 DIAGNOSIS — M779 Enthesopathy, unspecified: Secondary | ICD-10-CM | POA: Diagnosis present

## 2019-11-24 DIAGNOSIS — K047 Periapical abscess without sinus: Secondary | ICD-10-CM | POA: Diagnosis present

## 2019-11-24 DIAGNOSIS — M353 Polymyalgia rheumatica: Secondary | ICD-10-CM | POA: Diagnosis present

## 2019-11-24 DIAGNOSIS — E860 Dehydration: Secondary | ICD-10-CM | POA: Diagnosis present

## 2019-11-24 DIAGNOSIS — K0510 Chronic gingivitis, plaque induced: Secondary | ICD-10-CM | POA: Diagnosis present

## 2019-11-24 DIAGNOSIS — G9340 Encephalopathy, unspecified: Secondary | ICD-10-CM | POA: Diagnosis not present

## 2019-11-24 DIAGNOSIS — R4781 Slurred speech: Secondary | ICD-10-CM | POA: Diagnosis present

## 2019-11-24 DIAGNOSIS — I10 Essential (primary) hypertension: Secondary | ICD-10-CM | POA: Diagnosis present

## 2019-11-24 DIAGNOSIS — F05 Delirium due to known physiological condition: Secondary | ICD-10-CM | POA: Diagnosis not present

## 2019-11-24 DIAGNOSIS — F419 Anxiety disorder, unspecified: Secondary | ICD-10-CM | POA: Diagnosis present

## 2019-11-24 DIAGNOSIS — R509 Fever, unspecified: Secondary | ICD-10-CM | POA: Diagnosis not present

## 2019-11-24 DIAGNOSIS — Z515 Encounter for palliative care: Secondary | ICD-10-CM | POA: Diagnosis not present

## 2019-11-24 DIAGNOSIS — R29710 NIHSS score 10: Secondary | ICD-10-CM | POA: Diagnosis present

## 2019-11-24 DIAGNOSIS — F0390 Unspecified dementia without behavioral disturbance: Secondary | ICD-10-CM | POA: Diagnosis present

## 2019-11-24 DIAGNOSIS — I712 Thoracic aortic aneurysm, without rupture: Secondary | ICD-10-CM | POA: Diagnosis present

## 2019-11-24 DIAGNOSIS — R29810 Facial weakness: Secondary | ICD-10-CM | POA: Diagnosis present

## 2019-11-24 DIAGNOSIS — M810 Age-related osteoporosis without current pathological fracture: Secondary | ICD-10-CM | POA: Diagnosis present

## 2019-11-24 LAB — URINALYSIS, ROUTINE W REFLEX MICROSCOPIC
Bacteria, UA: NONE SEEN
Bilirubin Urine: NEGATIVE
Glucose, UA: NEGATIVE mg/dL
Hgb urine dipstick: NEGATIVE
Ketones, ur: 20 mg/dL — AB
Leukocytes,Ua: NEGATIVE
Nitrite: NEGATIVE
Protein, ur: 100 mg/dL — AB
Specific Gravity, Urine: 1.032 — ABNORMAL HIGH (ref 1.005–1.030)
pH: 5 (ref 5.0–8.0)

## 2019-11-24 LAB — AMMONIA: Ammonia: 16 umol/L (ref 9–35)

## 2019-11-24 LAB — CBC
HCT: 47.7 % — ABNORMAL HIGH (ref 36.0–46.0)
Hemoglobin: 15.7 g/dL — ABNORMAL HIGH (ref 12.0–15.0)
MCH: 30.5 pg (ref 26.0–34.0)
MCHC: 32.9 g/dL (ref 30.0–36.0)
MCV: 92.6 fL (ref 80.0–100.0)
Platelets: 181 10*3/uL (ref 150–400)
RBC: 5.15 MIL/uL — ABNORMAL HIGH (ref 3.87–5.11)
RDW: 13.4 % (ref 11.5–15.5)
WBC: 6.9 10*3/uL (ref 4.0–10.5)
nRBC: 0 % (ref 0.0–0.2)

## 2019-11-24 LAB — BASIC METABOLIC PANEL
Anion gap: 13 (ref 5–15)
BUN: 20 mg/dL (ref 8–23)
CO2: 21 mmol/L — ABNORMAL LOW (ref 22–32)
Calcium: 8.7 mg/dL — ABNORMAL LOW (ref 8.9–10.3)
Chloride: 102 mmol/L (ref 98–111)
Creatinine, Ser: 0.81 mg/dL (ref 0.44–1.00)
GFR calc Af Amer: >60 mL/min (ref >60)
GFR calc non Af Amer: >60 mL/min (ref >60)
Glucose, Bld: 105 mg/dL — ABNORMAL HIGH (ref 70–99)
Potassium: 4.3 mmol/L (ref 3.5–5.1)
Sodium: 136 mmol/L (ref 135–145)

## 2019-11-24 LAB — GLUCOSE, CAPILLARY
Glucose-Capillary: 98 mg/dL (ref 70–99)
Glucose-Capillary: 101 mg/dL — ABNORMAL HIGH (ref 70–99)

## 2019-11-24 LAB — VITAMIN B12: Vitamin B-12: 171 pg/mL — ABNORMAL LOW (ref 180–914)

## 2019-11-24 LAB — TSH: TSH: 0.752 u[IU]/mL (ref 0.350–4.500)

## 2019-11-24 LAB — SARS CORONAVIRUS 2 (TAT 6-24 HRS): SARS Coronavirus 2: NEGATIVE

## 2019-11-24 MED ORDER — ONDANSETRON HCL 4 MG/2ML IJ SOLN: 4.0000 mg | Freq: Four times a day (QID) | INTRAMUSCULAR | Status: DC | PRN

## 2019-11-24 MED ORDER — CYANOCOBALAMIN 1000 MCG/ML IJ SOLN
1000.0000 ug | Freq: Once | INTRAMUSCULAR | Status: AC
  Administered 2019-11-24: 1000 ug via INTRAMUSCULAR
  Filled 2019-11-24: qty 1

## 2019-11-24 MED ORDER — ACETAMINOPHEN 650 MG RE SUPP
650.0000 mg | Freq: Four times a day (QID) | RECTAL | Status: DC | PRN
  Administered 2019-11-24 – 2019-11-25 (×2): 650 mg via RECTAL
  Filled 2019-11-24 (×2): qty 1

## 2019-11-24 MED ORDER — SODIUM CHLORIDE 0.9% FLUSH: 3.0000 mL | INTRAVENOUS | Status: DC | PRN

## 2019-11-24 MED ORDER — ENOXAPARIN SODIUM 40 MG/0.4ML ~~LOC~~ SOLN
40.0000 mg | SUBCUTANEOUS | Status: DC
  Administered 2019-11-24 – 2019-11-27 (×4): 40 mg via SUBCUTANEOUS
  Filled 2019-11-24 (×4): qty 0.4

## 2019-11-24 MED ORDER — SODIUM CHLORIDE 0.9 % IV SOLN: 250.0000 mL | INTRAVENOUS | Status: DC | PRN

## 2019-11-24 MED ORDER — SODIUM CHLORIDE 0.9% FLUSH
3.0000 mL | Freq: Two times a day (BID) | INTRAVENOUS | Status: DC
  Administered 2019-11-24 – 2019-11-28 (×6): 3 mL via INTRAVENOUS

## 2019-11-24 MED ORDER — ONDANSETRON HCL 4 MG PO TABS: 4.0000 mg | ORAL_TABLET | Freq: Four times a day (QID) | ORAL | Status: DC | PRN

## 2019-11-24 MED ORDER — ACETAMINOPHEN 325 MG PO TABS
650.0000 mg | ORAL_TABLET | Freq: Four times a day (QID) | ORAL | Status: DC | PRN
  Administered 2019-11-26: 650 mg via ORAL
  Filled 2019-11-24: qty 2

## 2019-11-24 MED ORDER — HYDRALAZINE HCL 20 MG/ML IJ SOLN
10.0000 mg | Freq: Four times a day (QID) | INTRAMUSCULAR | Status: DC | PRN
  Administered 2019-11-24 – 2019-11-26 (×2): 10 mg via INTRAVENOUS
  Filled 2019-11-24 (×4): qty 1

## 2019-11-24 MED ORDER — METOPROLOL TARTRATE 5 MG/5ML IV SOLN
2.5000 mg | Freq: Three times a day (TID) | INTRAVENOUS | Status: DC
  Administered 2019-11-24 – 2019-11-26 (×5): 2.5 mg via INTRAVENOUS
  Filled 2019-11-24 (×5): qty 5

## 2019-11-24 MED ORDER — SENNOSIDES-DOCUSATE SODIUM 8.6-50 MG PO TABS: 1.0000 | ORAL_TABLET | Freq: Every evening | ORAL | Status: DC | PRN

## 2019-11-24 MED ORDER — SODIUM CHLORIDE 0.9% FLUSH
3.0000 mL | Freq: Two times a day (BID) | INTRAVENOUS | Status: DC
  Administered 2019-11-24 – 2019-11-28 (×6): 3 mL via INTRAVENOUS

## 2019-11-24 MED ORDER — ENOXAPARIN SODIUM 40 MG/0.4ML ~~LOC~~ SOLN: 40.0000 mg | SUBCUTANEOUS | Status: DC

## 2019-11-24 NOTE — Progress Notes (Signed)
PROGRESS NOTE    Shirley [name]  ZOX:[id number] DOB: 03-16-1934 DOA: 11/23/2019 PCP: Roderick Pee, MD (Inactive)   Brief Narrative:  HPI: Shirley [name] is a 85 y.o. female with medical history significant for dementia, anxiety, hypertension, and polymyalgia rheumatica, now presenting to the emergency department from her nursing facility for evaluation of increased confusion and left-sided weakness.  History is somewhat unclear but these changes were apparently noticed at around 6 PM with last seen in usual state possibly within an hour prior to that.  At her baseline, the patient is reportedly oriented to self only, ambulates with a walker, and does not have dysarthria though her speech is usually nonsensical.  Patient is unable to contribute much to the history due to her clinical condition.  ED Course: Upon arrival to the ED, patient is found to be afebrile, saturating well on room air, and with stable blood pressure.  Chest x-ray is notable for a left retrocardiac opacity that appears similar to remote prior.  Chemistry panel is unremarkable.  CBC with mild polycythemia.  Noncontrast head CT is negative for acute intracranial abnormality.  CTA head and neck negative for emergent LVO and no evidence for significant ischemia on CT perfusion study.  Neurology was consulted by the ED physician and recommends MRI brain, further stroke work-up if positive for acute infarct, but otherwise evaluate for causes of suspected toxic metabolic encephalopathy.  Assessment & Plan:   Principal Problem:   Acute encephalopathy Active Problems:   Essential hypertension   Moderate dementia (HCC)   Ascending aortic aneurysm (HCC)   1. Acute encephalopathy, ?left-sided deficits  - Presents from memory unit at her nursing home with increased confusion and possible left-sided weakness, noted to have subtle left lower facial weakness in ED with no acute findings on head CT, no emergent LVO on CTA H&N, and no  evidence for significant ischemia on CT perfusion study  - Neurology consulting and much appreciated, recommending encephalopathy workup and MRI brain  - Continue neuro checks, check B12, ammonia, urinalysis, and TSH, check MRI brain    2. Dementia with behavioral changes/?  Delirium - Family reported to ED staff that patient is oriented to self only and ambulates with a walker at baseline.  She was completely confused and thought she was at home when I saw her and she was also in hand mittens.  Trying to get out of the bed.  Delirium precautions.  Continue Aricept.  3. Hypertension  - Blood pressure slightly elevated at times.  Continue metoprolol and monitor.  Add as needed hydralazine.  4. Anxiety  - Continue Celexa    5. Ascending aortic aneurysm  - 4.3 cm aneurysm noted incidentally in ED  - Annual follow-up imaging recommended  6.  Vitamin B12 deficiency: We will provide with 1 injection of IM vitamin B12.  Will need to be started on oral vitamin B12 at the time of discharge.  DVT prophylaxis: We will start on Lovenox Code Status: Full code Family Communication:  None present at bedside.  I tried to reach patient's husband Mr. Ophie Burrowes and her son Tawanna Cooler at 3 different provided numbers which were [phone], [phone] and [phone] but was unable to reach anyone and was unable to leave voicemail as well. Disposition Plan: To be determined  Estimated body mass index is 22.45 kg/m as calculated from the following:   Height as of 09/01/18:NEVEA SPIEWAK>  (1.651 m).   Weight as of this encounter: 61.2 kg.  Nutritional status:               Consultants:   Neurology  Procedures:   None  Antimicrobials:   None   Subjective: Seen and examined.  Patient was in mittens and completely confused, trying to get out of the bed.  She thought she was at home.  Objective: Vitals:   11/23/19 2200 11/23/19 2300 11/24/19 0051 11/24/19 0842  BP: (!) 162/97 (!) 186/94   (!) 153/99  Pulse: 83   85  Resp:  (!) 22  14  Temp:      TempSrc:      SpO2: 96%   91%  Weight:   61.2 kg     Intake/Output Summary (Last 24 hours) at 11/24/2019 1457 Last data filed at 11/24/2019 0300 Gross per 24 hour  Intake 93 ml  Output --  Net 93 ml   Filed Weights   11/23/19 1800 11/24/19 0051  Weight: 62.4 kg 61.2 kg    Examination:  General exam: Appears fidgety and confused Respiratory system: Clear to auscultation. Respiratory effort normal. Cardiovascular system: S1 & S2 heard, RRR. No JVD, murmurs, rubs, gallops or clicks. No pedal edema. Gastrointestinal system: Abdomen is nondistended, soft and nontender. No organomegaly or masses felt. Normal bowel sounds heard. Central nervous system: Alert but disoriented.  Could not perform thorough neuro exam. Extremities: Symmetric 5 x 5 power. Skin: No rashes, lesions or ulcers Psychiatry: Unable to assess.  Has a history of severe dementia.  She is in memory unit.   Data Reviewed: I have personally reviewed following labs and imaging studies  CBC: Recent Labs  Lab 11/23/19 1849 11/23/19 1850 11/24/19 0438  WBC 6.6  --  6.9  NEUTROABS 4.7  --   --   HGB 16.8* 18.0* 15.7*  HCT 51.3* 53.0* 47.7*  MCV 92.9  --  92.6  PLT 192  --  181   Basic Metabolic Panel: Recent Labs  Lab 11/23/19 1849 11/23/19 1850 11/24/19 0438  NA 136 137 136  K 3.9 3.9 4.3  CL 100 99 102  CO2 24  --  21*  GLUCOSE 154* 154* 105*  BUN 19 23 20   CREATININE 0.93 0.80 0.81  CALCIUM 9.1  --  8.7*   GFR: CrCl cannot be calculated (Unknown ideal weight.). Liver Function Tests: Recent Labs  Lab 11/23/19 1849  AST 29  ALT 18  ALKPHOS 79  BILITOT 0.6  PROT 7.4  ALBUMIN 3.9   No results for input(s): LIPASE, AMYLASE in the last 168 hours. Recent Labs  Lab 11/24/19 0001  AMMONIA 16   Coagulation Profile: Recent Labs  Lab 11/23/19 1849  INR 1.0   Cardiac Enzymes: No results for input(s): CKTOTAL, CKMB, CKMBINDEX,  TROPONINI in the last 168 hours. BNP (last 3 results) No results for input(s): PROBNP in the last 8760 hours. HbA1C: No results for input(s): HGBA1C in the last 72 hours. CBG: Recent Labs  Lab 11/23/19 1845 11/24/19 0843  GLUCAP 157* 98   Lipid Profile: No results for input(s): CHOL, HDL, LDLCALC, TRIG, CHOLHDL, LDLDIRECT in the last 72 hours. Thyroid Function Tests: Recent Labs    11/24/19 0001  TSH 0.752   Anemia Panel: Recent Labs    11/24/19 0001  VITAMINB12 171*   Sepsis Labs: No results for input(s): PROCALCITON, LATICACIDVEN in the last 168 hours.  Recent Results (from the past 240 hour(s))  SARS CORONAVIRUS 2 (TAT 6-24 HRS) Nasopharyngeal Nasopharyngeal Swab     Status: None  Collection Time: 11/23/19  9:38 PM   Specimen: Nasopharyngeal Swab  Result Value Ref Range Status   SARS Coronavirus 2 NEGATIVE NEGATIVE Final    Comment: (NOTE) SARS-CoV-2 target nucleic acids are NOT DETECTED. The SARS-CoV-2 RNA is generally detectable in upper and lower respiratory specimens during the acute phase of infection. Negative results do not preclude SARS-CoV-2 infection, do not rule out co-infections with other pathogens, and should not be used as the sole basis for treatment or other patient management decisions. Negative results must be combined with clinical observations, patient history, and epidemiological information. The expected result is Negative. Fact Sheet for Patients: HairSlick.nohttps://www.fda.gov/media/138098/download Fact Sheet for Healthcare Providers: quierodirigir.comhttps://www.fda.gov/media/138095/download This test is not yet approved or cleared by the [place] FDA and  has been authorized for detection and/or diagnosis of SARS-CoV-2 by FDA under an Emergency Use Authorization (EUA). This EUA will remain  in effect (meaning this test can be used) for the duration of the COVID-19 declaration under Section 56 4(b)(1) of the Act, 21 U.S.C. section 360bbb-3(b)(1), unless  the authorization is terminated or revoked sooner. Performed at [facility], [address] [place], [place] [address]       Radiology Studies: CT Code Stroke CTA Head W/WO contrast  Result Date: 11/23/2019 CLINICAL DATA:  Left-sided weakness. EXAM: CT ANGIOGRAPHY HEAD AND NECK CT PERFUSION BRAIN TECHNIQUE: Multidetector CT imaging of the head and neck was performed using the standard protocol during bolus administration of intravenous contrast. Multiplanar CT image reconstructions and MIPs were obtained to evaluate the vascular anatomy. Carotid stenosis measurements (when applicable) are obtained utilizing NASCET criteria, using the distal internal carotid diameter as the denominator. Multiphase CT imaging of the brain was performed following IV bolus contrast injection. Subsequent parametric perfusion maps were calculated using RAPID software. CONTRAST:  100mL OMNIPAQUE IOHEXOL 350 MG/ML SOLN COMPARISON:  None. FINDINGS: CTA NECK FINDINGS Aortic arch: Standard 3 vessel aortic arch with atherosclerotic plaque but no significant arch vessel origin stenosis. Small to moderate volume soft plaque partially visualized in the descending thoracic aorta. Aneurysmal dilatation of the ascending aorta with maximal diameter of 4.3 cm, new from a 2016 chest CT. Right carotid system: Patent with minimal calcified plaque at the carotid bifurcation. No evidence of stenosis or dissection. Left carotid system: Patent with heavily calcified plaque at the carotid bifurcation resulting in less than 50% stenosis of the distal common carotid artery and ICA origin. Vertebral arteries: Patent with the right being mildly dominant. No evidence of significant stenosis or dissection. Skeleton: Mild-to-moderate disc and moderate facet degeneration in the cervical spine. Mild superior endplate compression fractures/Schmorl's node deformities at T2, T3, T4, and T5, all chronic in appearance. Other neck: No evidence of cervical  lymphadenopathy or mass. Upper chest: No apical lung consolidation or mass. Review of the MIP images confirms the above findings CTA HEAD FINDINGS Anterior circulation: The internal carotid arteries are patent from skull base to carotid termini with mild nonstenotic calcified plaque bilaterally. ACAs and MCAs are patent without evidence of proximal branch occlusion or significant A1 or M1 stenosis, however there is prominent branch vessel atherosclerosis including severe stenoses of the M3 and more distal MCA branch vessels as well as distal A2 and more distal ACAs. No aneurysm is identified. Posterior circulation: The intracranial vertebral arteries are widely patent to the basilar. Patent PICA and SCA origins are visualized bilaterally. The basilar artery is widely patent. There are small right and large left posterior communicating arteries with a fetal origin of  the left PCA. There are moderate stenoses of the left posterior communicating artery and both P2 segments, and they were severe bilateral P3 stenoses. No aneurysm is identified. Venous sinuses: Poorly evaluated due to contrast timing though grossly patent. Anatomic variants: Low fetal left PCA. Review of the MIP images confirms the above findings CT Brain Perfusion Findings: ASPECTS: 10 CBF (<30%) Volume: 62mL Perfusion (Tmax>6.0s) volume: 70mL Mismatch Volume: n/a Infarction Location: No infarct evident on CTP. IMPRESSION: 1. No emergent large vessel occlusion, and no evidence of significant ischemia on CTP. 2. Advanced intracranial atherosclerosis primarily involving the distal branch vessels. No flow limiting proximal intracranial stenosis. 3. Left greater than right cervical carotid artery atherosclerosis without significant stenosis. 4. Widely patent vertebral arteries. 5. 4.3 cm ascending aortic aneurysm. Recommend annual imaging followup by CTA or MRA. This recommendation follows 2010 ACCF/AHA/AATS/ACR/ASA/SCA/SCAI/SIR/STS/SVM Guidelines for the  Diagnosis and Management of Patients with Thoracic Aortic Disease. Circulation. 2010; 121: Q761-P509. Aortic aneurysm NOS (ICD10-I71.9) 6. Aortic Atherosclerosis (ICD10-I70.0). Preliminary findings of no emergent LVO and negative CTP were communicated to Dr. Rory Percy at 7:26 pm on 11/23/2019 by text page via the [facility] messaging system. Electronically Signed   By: Logan Bores M.D.   On: 11/23/2019 19:50   CT Code Stroke CTA Neck W/WO contrast  Result Date: 11/23/2019 CLINICAL DATA:  Left-sided weakness. EXAM: CT ANGIOGRAPHY HEAD AND NECK CT PERFUSION BRAIN TECHNIQUE: Multidetector CT imaging of the head and neck was performed using the standard protocol during bolus administration of intravenous contrast. Multiplanar CT image reconstructions and MIPs were obtained to evaluate the vascular anatomy. Carotid stenosis measurements (when applicable) are obtained utilizing NASCET criteria, using the distal internal carotid diameter as the denominator. Multiphase CT imaging of the brain was performed following IV bolus contrast injection. Subsequent parametric perfusion maps were calculated using RAPID software. CONTRAST:  132mL OMNIPAQUE IOHEXOL 350 MG/ML SOLN COMPARISON:  None. FINDINGS: CTA NECK FINDINGS Aortic arch: Standard 3 vessel aortic arch with atherosclerotic plaque but no significant arch vessel origin stenosis. Small to moderate volume soft plaque partially visualized in the descending thoracic aorta. Aneurysmal dilatation of the ascending aorta with maximal diameter of 4.3 cm, new from a 2016 chest CT. Right carotid system: Patent with minimal calcified plaque at the carotid bifurcation. No evidence of stenosis or dissection. Left carotid system: Patent with heavily calcified plaque at the carotid bifurcation resulting in less than 50% stenosis of the distal common carotid artery and ICA origin. Vertebral arteries: Patent with the right being mildly dominant. No evidence of significant stenosis or  dissection. Skeleton: Mild-to-moderate disc and moderate facet degeneration in the cervical spine. Mild superior endplate compression fractures/Schmorl's node deformities at T2, T3, T4, and T5, all chronic in appearance. Other neck: No evidence of cervical lymphadenopathy or mass. Upper chest: No apical lung consolidation or mass. Review of the MIP images confirms the above findings CTA HEAD FINDINGS Anterior circulation: The internal carotid arteries are patent from skull base to carotid termini with mild nonstenotic calcified plaque bilaterally. ACAs and MCAs are patent without evidence of proximal branch occlusion or significant A1 or M1 stenosis, however there is prominent branch vessel atherosclerosis including severe stenoses of the M3 and more distal MCA branch vessels as well as distal A2 and more distal ACAs. No aneurysm is identified. Posterior circulation: The intracranial vertebral arteries are widely patent to the basilar. Patent PICA and SCA origins are visualized bilaterally. The basilar artery is widely patent. There are small right and large left posterior communicating arteries  with a fetal origin of the left PCA. There are moderate stenoses of the left posterior communicating artery and both P2 segments, and they were severe bilateral P3 stenoses. No aneurysm is identified. Venous sinuses: Poorly evaluated due to contrast timing though grossly patent. Anatomic variants: Low fetal left PCA. Review of the MIP images confirms the above findings CT Brain Perfusion Findings: ASPECTS: 10 CBF (<30%) Volume: 0mL Perfusion (Tmax>6.0s) volume: 0mL Mismatch Volume: n/a Infarction Location: No infarct evident on CTP. IMPRESSION: 1. No emergent large vessel occlusion, and no evidence of significant ischemia on CTP. 2. Advanced intracranial atherosclerosis primarily involving the distal branch vessels. No flow limiting proximal intracranial stenosis. 3. Left greater than right cervical carotid artery  atherosclerosis without significant stenosis. 4. Widely patent vertebral arteries. 5. 4.3 cm ascending aortic aneurysm. Recommend annual imaging followup by CTA or MRA. This recommendation follows 2010 ACCF/AHA/AATS/ACR/ASA/SCA/SCAI/SIR/STS/SVM Guidelines for the Diagnosis and Management of Patients with Thoracic Aortic Disease. Circulation. 2010; 121: Z610-R604. Aortic aneurysm NOS (ICD10-I71.9) 6. Aortic Atherosclerosis (ICD10-I70.0). Preliminary findings of no emergent LVO and negative CTP were communicated to Dr. Wilford Corner at 7:26 pm on 11/23/2019 by text page via the [facility] messaging system. Electronically Signed   By: Sebastian Ache M.D.   On: 11/23/2019 19:50   CT Code Stroke Cerebral Perfusion with contrast  Result Date: 11/23/2019 CLINICAL DATA:  Left-sided weakness. EXAM: CT ANGIOGRAPHY HEAD AND NECK CT PERFUSION BRAIN TECHNIQUE: Multidetector CT imaging of the head and neck was performed using the standard protocol during bolus administration of intravenous contrast. Multiplanar CT image reconstructions and MIPs were obtained to evaluate the vascular anatomy. Carotid stenosis measurements (when applicable) are obtained utilizing NASCET criteria, using the distal internal carotid diameter as the denominator. Multiphase CT imaging of the brain was performed following IV bolus contrast injection. Subsequent parametric perfusion maps were calculated using RAPID software. CONTRAST:  OMNIPAQUE IOHEXOL 350 MG/ML SOLN COMPARISON:  None. FINDINGS: CTA NECK FINDINGS Aortic arch: Standard 3 vessel aortic arch with atherosclerotic plaque but no significant arch vessel origin stenosis. Small to moderate volume soft plaque partially visualized in the descending thoracic aorta. Aneurysmal dilatation of the ascending aorta with maximal diameter of 4.3 cm, new from a 2016 chest CT. Right carotid system: Patent with minimal calcified plaque at the carotid bifurcation. No evidence of stenosis or dissection. Left carotid  system: Patent with heavily calcified plaque at the carotid bifurcation resulting in less than 50% stenosis of the distal common carotid artery and ICA origin. Vertebral arteries: Patent with the right being mildly dominant. No evidence of significant stenosis or dissection. Skeleton: Mild-to-moderate disc and moderate facet degeneration in the cervical spine. Mild superior endplate compression fractures/Schmorl's node deformities at T2, T3, T4, and T5, all chronic in appearance. Other neck: No evidence of cervical lymphadenopathy or mass. Upper chest: No apical lung consolidation or mass. Review of the MIP images confirms the above findings CTA HEAD FINDINGS Anterior circulation: The internal carotid arteries are patent from skull base to carotid termini with mild nonstenotic calcified plaque bilaterally. ACAs and MCAs are patent without evidence of proximal branch occlusion or significant A1 or M1 stenosis, however there is prominent branch vessel atherosclerosis including severe stenoses of the M3 and more distal MCA branch vessels as well as distal A2 and more distal ACAs. No aneurysm is identified. Posterior circulation: The intracranial vertebral arteries are widely patent to the basilar. Patent PICA and SCA origins are visualized bilaterally. The basilar artery is widely patent. There are small right and  large left posterior communicating arteries with a fetal origin of the left PCA. There are moderate stenoses of the left posterior communicating artery and both P2 segments, and they were severe bilateral P3 stenoses. No aneurysm is identified. Venous sinuses: Poorly evaluated due to contrast timing though grossly patent. Anatomic variants: Low fetal left PCA. Review of the MIP images confirms the above findings CT Brain Perfusion Findings: ASPECTS: 10 CBF (<30%) Volume: 0mL Perfusion (Tmax>6.0s) volume: 0mL Mismatch Volume: n/a Infarction Location: No infarct evident on CTP. IMPRESSION: 1. No emergent large  vessel occlusion, and no evidence of significant ischemia on CTP. 2. Advanced intracranial atherosclerosis primarily involving the distal branch vessels. No flow limiting proximal intracranial stenosis. 3. Left greater than right cervical carotid artery atherosclerosis without significant stenosis. 4. Widely patent vertebral arteries. 5. 4.3 cm ascending aortic aneurysm. Recommend annual imaging followup by CTA or MRA. This recommendation follows 2010 ACCF/AHA/AATS/ACR/ASA/SCA/SCAI/SIR/STS/SVM Guidelines for the Diagnosis and Management of Patients with Thoracic Aortic Disease. Circulation. 2010; 121: V784-O962. Aortic aneurysm NOS (ICD10-I71.9) 6. Aortic Atherosclerosis (ICD10-I70.0). Preliminary findings of no emergent LVO and negative CTP were communicated to Dr. Wilford Corner at 7:26 pm on 11/23/2019 by text page via the [facility] messaging system. Electronically Signed   By: Sebastian Ache M.D.   On: 11/23/2019 19:50   DG Chest Portable 1 View  Result Date: 11/23/2019 CLINICAL DATA:  Acute shortness of breath EXAM: PORTABLE CHEST 1 VIEW COMPARISON:  07/06/2015 chest radiograph FINDINGS: UPPER limits normal heart size again noted. LEFT retrocardiac opacity appears similar to remote radiograph, but may represent atelectasis, scarring or airspace disease/consolidation. No definite pleural effusion or pneumothorax. No acute bony abnormalities. RIGHT shoulder arthroplasty changes noted. IMPRESSION: LEFT retrocardiac opacity which appears somewhat remote radiograph, but may represent atelectasis, scarring or airspace disease/consolidation. Electronically Signed   By: Harmon Pier M.D.   On: 11/23/2019 20:13   CT HEAD CODE STROKE WO CONTRAST  Result Date: 11/23/2019 CLINICAL DATA:  Code stroke. Left-sided weakness and slurred speech. EXAM: CT HEAD WITHOUT CONTRAST TECHNIQUE: Contiguous axial images were obtained from the base of the skull through the vertex without intravenous contrast. COMPARISON:  12/18/2018 FINDINGS:  Brain: There is no evidence of acute infarct, intracranial hemorrhage, mass, midline shift, or extra-axial fluid collection. Patchy to confluent hypodensities in the cerebral white matter bilaterally are unchanged and nonspecific but compatible with moderate chronic small vessel ischemic disease. Mild-to-moderate lateral and third ventriculomegaly is unchanged and may reflect central predominant cerebral atrophy although normal pressure hydrocephalus is not excluded in the appropriate clinical setting. Vascular: Calcified atherosclerosis at the skull base. No hyperdense vessel. Skull: No fracture or focal osseous lesion. Sinuses/Orbits: Left greater than right maxillary sinus mucosal thickening with moderate volume fluid on the left. Mild bilateral ethmoid air cell mucosal thickening. Clear mastoid air cells bilateral cataract extraction. Other: None. ASPECTS [facility] Stroke Program Early CT Score) - Ganglionic level infarction (caudate, lentiform nuclei, internal capsule, insula, M1-M3 cortex): 7 - Supraganglionic infarction (M4-M6 cortex): 3 Total score (0-10 with 10 being normal): 10 IMPRESSION: 1. No evidence of acute intracranial abnormality. 2. ASPECTS is 10. 3. Moderate chronic small vessel ischemic disease and unchanged ventriculomegaly. These results were communicated to Dr. Laurence Slate at 7:02 pm on 11/23/2019 by text page via the [facility] messaging system. Electronically Signed   By: Sebastian Ache M.D.   On: 11/23/2019 19:02    Scheduled Meds: . citalopram  10 mg Oral Daily  . donepezil  10 mg Oral QHS  . enoxaparin (LOVENOX) injection  40 mg Subcutaneous Q24H  . metoprolol tartrate  25 mg Oral BID  . sodium chloride flush  3 mL Intravenous Once  . sodium chloride flush  3 mL Intravenous Q12H  . sodium chloride flush  3 mL Intravenous Q12H   Continuous Infusions: . sodium chloride       LOS: 0 days   Time spent: 35 minutes   Hughie Closs, MD [facility]  11/24/2019, 2:57 PM   To  contact the attending provider between 7A-7P or the covering provider during after hours 7P-7A, please log into the web site [email or web address] and use password [email or web address].

## 2019-11-24 NOTE — Plan of Care (Signed)
  Problem: Clinical Measurements: Goal: Will remain free from infection Outcome: Progressing Goal: Diagnostic test results will improve Outcome: Progressing   Problem: Activity: Goal: Risk for activity intolerance will decrease Outcome: Progressing   Problem: Nutrition: Goal: Adequate nutrition will be maintained Outcome: Progressing   Problem: Elimination: Goal: Will not experience complications related to bowel motility Outcome: Progressing Goal: Will not experience complications related to urinary retention Outcome: Progressing   Problem: Safety: Goal: Ability to remain free from injury will improve Outcome: Progressing   Problem: Skin Integrity: Goal: Risk for impaired skin integrity will decrease Outcome: Progressing   

## 2019-11-24 NOTE — Progress Notes (Addendum)
NEURO HOSPITALIST PROGRESS NOTE   Subjective: Patient in bed, awake alert and pleasantly demented. Bilateral mitten restraints.  Exam: Vitals:   11/23/19 2300 11/24/19 0842  BP: (!) 186/94 (!) 153/99  Pulse:  85  Resp: (!) 22 14  Temp:    SpO2:  91%    Physical Exam  Constitutional: Appears well-developed and well-nourished.  Psych: Affect appropriate to situation Eyes: Normal external eye and conjunctiva. HENT: Normocephalic, no lesions, without obvious abnormality.   Musculoskeletal-no joint tenderness, deformity or swelling Cardiovascular: Normal rate and regular rhythm.  Respiratory: Effort normal, non-labored breathing saturations WNL GI: Soft.  No distension. There is no tenderness.  Skin: WDI   Neuro:  Mental Status: Alert,not oriented. [place].  Speech fluent without evidence of aphasia. intermittently follows  some commands. Opened and closed eyes. Raised both arms. openes eyes to voice. Cranial Nerves: II: blinks to threat bilaterally  III,IV, VI: ptosis not present, extra-ocular motions intact bilaterally pupils equal, round, reactive to light  V,VII: face appears symmetric  VIII: hearing normal bilaterally XII: midline tongue extension Motor: Able to move all 4 extremities spontaneously and anti gravity. Only moved arms to command.  Tone and bulk:normal tone throughout; no atrophy noted Cerebellar: No ataxia noted with movements in BUE Gait: deferred    Medications:  Scheduled: . citalopram  10 mg Oral Daily  . donepezil  10 mg Oral QHS  . enoxaparin (LOVENOX) injection  40 mg Subcutaneous Q24H  . metoprolol tartrate  25 mg Oral BID  . sodium chloride flush  3 mL Intravenous Once  . sodium chloride flush  3 mL Intravenous Q12H   Continuous: . sodium chloride     ZOX:WRUEAV chloride, acetaminophen **OR** acetaminophen, ondansetron **OR** ondansetron (ZOFRAN) IV, senna-docusate, sodium chloride  flush  Pertinent Labs/Diagnostics: BG: 105 creatinine: WNL Bun: WNL Ammonia: WNL TSH: WNL B12: 171 U/a: pending  Chest xray: may have atelectasis AST WNL ALT WNL CT Code Stroke CTA Head W/WO contrast  Result Date: 11/23/2019 CLINICAL DATA:  Left-sided weakness. EXAM: CT ANGIOGRAPHY HEAD AND NECK CT PERFUSION BRAIN TECHNIQUE: Multidetector CT imaging of the head and neck was performed using the standard protocol during bolus administration of intravenous contrast. Multiplanar CT image reconstructions and MIPs were obtained to evaluate the vascular anatomy. Carotid stenosis measurements (when applicable) are obtained utilizing NASCET criteria, using the distal internal carotid diameter as the denominator. Multiphase CT imaging of the brain was performed following IV bolus contrast injection. Subsequent parametric perfusion maps were calculated using RAPID software. CONTRAST:  OMNIPAQUE IOHEXOL 350 MG/ML SOLN COMPARISON:  None. FINDINGS: CTA NECK FINDINGS Aortic arch: Standard 3 vessel aortic arch with atherosclerotic plaque but no significant arch vessel origin stenosis. Small to moderate volume soft plaque partially visualized in the descending thoracic aorta. Aneurysmal dilatation of the ascending aorta with maximal diameter of 4.3 cm, new from a 2016 chest CT. Right carotid system: Patent with minimal calcified plaque at the carotid bifurcation. No evidence of stenosis or dissection. Left carotid system: Patent with heavily calcified plaque at the carotid bifurcation resulting in less than 50% stenosis of the distal common carotid artery and ICA origin. Vertebral arteries: Patent with the right being mildly dominant. No evidence of significant stenosis or dissection. Skeleton: Mild-to-moderate disc and moderate facet degeneration in the cervical spine. Mild superior endplate compression fractures/Schmorl's node deformities at T2,  T3, T4, and T5, all chronic in appearance. Other neck: No evidence  of cervical lymphadenopathy or mass. Upper chest: No apical lung consolidation or mass. Review of the MIP images confirms the above findings CTA HEAD FINDINGS Anterior circulation: The internal carotid arteries are patent from skull base to carotid termini with mild nonstenotic calcified plaque bilaterally. ACAs and MCAs are patent without evidence of proximal branch occlusion or significant A1 or M1 stenosis, however there is prominent branch vessel atherosclerosis including severe stenoses of the M3 and more distal MCA branch vessels as well as distal A2 and more distal ACAs. No aneurysm is identified. Posterior circulation: The intracranial vertebral arteries are widely patent to the basilar. Patent PICA and SCA origins are visualized bilaterally. The basilar artery is widely patent. There are small right and large left posterior communicating arteries with a fetal origin of the left PCA. There are moderate stenoses of the left posterior communicating artery and both P2 segments, and they were severe bilateral P3 stenoses. No aneurysm is identified. Venous sinuses: Poorly evaluated due to contrast timing though grossly patent. Anatomic variants: Low fetal left PCA. Review of the MIP images confirms the above findings CT Brain Perfusion Findings: ASPECTS: 10 CBF (<30%) Volume: 0mL Perfusion (Tmax>6.0s) volume: 0mL Mismatch Volume: n/a Infarction Location: No infarct evident on CTP. IMPRESSION: 1. No emergent large vessel occlusion, and no evidence of significant ischemia on CTP. 2. Advanced intracranial atherosclerosis primarily involving the distal branch vessels. No flow limiting proximal intracranial stenosis. 3. Left greater than right cervical carotid artery atherosclerosis without significant stenosis. 4. Widely patent vertebral arteries. 5. 4.3 cm ascending aortic aneurysm. Recommend annual imaging followup by CTA or MRA. This recommendation follows 2010 ACCF/AHA/AATS/ACR/ASA/SCA/SCAI/SIR/STS/SVM Guidelines  for the Diagnosis and Management of Patients with Thoracic Aortic Disease. Circulation. 2010; 121: B284-X324: [id number]. Aortic aneurysm NOS (ICD10-I71.9) 6. Aortic Atherosclerosis (ICD10-I70.0). Preliminary findings of no emergent LVO and negative CTP were communicated to Dr. Wilford CornerArora at 7:26 pm on 11/23/2019 by text page via the [facility]. Electronically Signed   By: Sebastian AcheAllen  Grady M.D.   On: 11/23/2019 19:50   CT Code Stroke CTA Neck W/WO contrast  Result Date: 11/23/2019 CLINICAL DATA:  Left-sided weakness. EXAM: CT ANGIOGRAPHY HEAD AND NECK CT PERFUSION BRAIN TECHNIQUE: Multidetector CT imaging of the head and neck was performed using the standard protocol during bolus administration of intravenous contrast. Multiplanar CT image reconstructions and MIPs were obtained to evaluate the vascular anatomy. Carotid stenosis measurements (when applicable) are obtained utilizing NASCET criteria, using the distal internal carotid diameter as the denominator. Multiphase CT imaging of the brain was performed following IV bolus contrast injection. Subsequent parametric perfusion maps were calculated using RAPID software. CONTRAST:  100mL OMNIPAQUE IOHEXOL 350 MG/ML SOLN COMPARISON:  None. FINDINGS: CTA NECK FINDINGS Aortic arch: Standard 3 vessel aortic arch with atherosclerotic plaque but no significant arch vessel origin stenosis. Small to moderate volume soft plaque partially visualized in the descending thoracic aorta. Aneurysmal dilatation of the ascending aorta with maximal diameter of 4.3 cm, new from a 2016 chest CT. Right carotid system: Patent with minimal calcified plaque at the carotid bifurcation. No evidence of stenosis or dissection. Left carotid system: Patent with heavily calcified plaque at the carotid bifurcation resulting in less than 50% stenosis of the distal common carotid artery and ICA origin. Vertebral arteries: Patent with the right being mildly dominant. No evidence of significant stenosis or  dissection. Skeleton: Mild-to-moderate disc and moderate facet degeneration in the cervical spine. Mild superior endplate  compression fractures/Schmorl's node deformities at T2, T3, T4, and T5, all chronic in appearance. Other neck: No evidence of cervical lymphadenopathy or mass. Upper chest: No apical lung consolidation or mass. Review of the MIP images confirms the above findings CTA HEAD FINDINGS Anterior circulation: The internal carotid arteries are patent from skull base to carotid termini with mild nonstenotic calcified plaque bilaterally. ACAs and MCAs are patent without evidence of proximal branch occlusion or significant A1 or M1 stenosis, however there is prominent branch vessel atherosclerosis including severe stenoses of the M3 and more distal MCA branch vessels as well as distal A2 and more distal ACAs. No aneurysm is identified. Posterior circulation: The intracranial vertebral arteries are widely patent to the basilar. Patent PICA and SCA origins are visualized bilaterally. The basilar artery is widely patent. There are small right and large left posterior communicating arteries with a fetal origin of the left PCA. There are moderate stenoses of the left posterior communicating artery and both P2 segments, and they were severe bilateral P3 stenoses. No aneurysm is identified. Venous sinuses: Poorly evaluated due to contrast timing though grossly patent. Anatomic variants: Low fetal left PCA. Review of the MIP images confirms the above findings CT Brain Perfusion Findings: ASPECTS: 10 CBF (<30%) Volume: 0mL Perfusion (Tmax>6.0s) volume: 0mL Mismatch Volume: n/a Infarction Location: No infarct evident on CTP. IMPRESSION: 1. No emergent large vessel occlusion, and no evidence of significant ischemia on CTP. 2. Advanced intracranial atherosclerosis primarily involving the distal branch vessels. No flow limiting proximal intracranial stenosis. 3. Left greater than right cervical carotid artery  atherosclerosis without significant stenosis. 4. Widely patent vertebral arteries. 5. 4.3 cm ascending aortic aneurysm. Recommend annual imaging followup by CTA or MRA. This recommendation follows 2010 ACCF/AHA/AATS/ACR/ASA/SCA/SCAI/SIR/STS/SVM Guidelines for the Diagnosis and Management of Patients with Thoracic Aortic Disease. Circulation. 2010; 121: Z610-R604. Aortic aneurysm NOS (ICD10-I71.9) 6. Aortic Atherosclerosis (ICD10-I70.0). Preliminary findings of no emergent LVO and negative CTP were communicated to Dr. Wilford Corner at 7:26 pm on 11/23/2019 by text page via the [facility] messaging system. Electronically Signed   By: Sebastian Ache M.D.   On: 11/23/2019 19:50   CT Code Stroke Cerebral Perfusion with contrast  Result Date: 11/23/2019 CLINICAL DATA:  Left-sided weakness. EXAM: CT ANGIOGRAPHY HEAD AND NECK CT PERFUSION BRAIN TECHNIQUE: Multidetector CT imaging of the head and neck was performed using the standard protocol during bolus administration of intravenous contrast. Multiplanar CT image reconstructions and MIPs were obtained to evaluate the vascular anatomy. Carotid stenosis measurements (when applicable) are obtained utilizing NASCET criteria, using the distal internal carotid diameter as the denominator. Multiphase CT imaging of the brain was performed following IV bolus contrast injection. Subsequent parametric perfusion maps were calculated using RAPID software. CONTRAST:  OMNIPAQUE IOHEXOL 350 MG/ML SOLN COMPARISON:  None. FINDINGS: CTA NECK FINDINGS Aortic arch: Standard 3 vessel aortic arch with atherosclerotic plaque but no significant arch vessel origin stenosis. Small to moderate volume soft plaque partially visualized in the descending thoracic aorta. Aneurysmal dilatation of the ascending aorta with maximal diameter of 4.3 cm, new from a 2016 chest CT. Right carotid system: Patent with minimal calcified plaque at the carotid bifurcation. No evidence of stenosis or dissection. Left carotid  system: Patent with heavily calcified plaque at the carotid bifurcation resulting in less than 50% stenosis of the distal common carotid artery and ICA origin. Vertebral arteries: Patent with the right being mildly dominant. No evidence of significant stenosis or dissection. Skeleton: Mild-to-moderate disc and moderate facet degeneration in the  cervical spine. Mild superior endplate compression fractures/Schmorl's node deformities at T2, T3, T4, and T5, all chronic in appearance. Other neck: No evidence of cervical lymphadenopathy or mass. Upper chest: No apical lung consolidation or mass. Review of the MIP images confirms the above findings CTA HEAD FINDINGS Anterior circulation: The internal carotid arteries are patent from skull base to carotid termini with mild nonstenotic calcified plaque bilaterally. ACAs and MCAs are patent without evidence of proximal branch occlusion or significant A1 or M1 stenosis, however there is prominent branch vessel atherosclerosis including severe stenoses of the M3 and more distal MCA branch vessels as well as distal A2 and more distal ACAs. No aneurysm is identified. Posterior circulation: The intracranial vertebral arteries are widely patent to the basilar. Patent PICA and SCA origins are visualized bilaterally. The basilar artery is widely patent. There are small right and large left posterior communicating arteries with a fetal origin of the left PCA. There are moderate stenoses of the left posterior communicating artery and both P2 segments, and they were severe bilateral P3 stenoses. No aneurysm is identified. Venous sinuses: Poorly evaluated due to contrast timing though grossly patent. Anatomic variants: Low fetal left PCA. Review of the MIP images confirms the above findings CT Brain Perfusion Findings: ASPECTS: 10 CBF (<30%) Volume: 40mL Perfusion (Tmax>6.0s) volume: 30mL Mismatch Volume: n/a Infarction Location: No infarct evident on CTP. IMPRESSION: 1. No emergent large  vessel occlusion, and no evidence of significant ischemia on CTP. 2. Advanced intracranial atherosclerosis primarily involving the distal branch vessels. No flow limiting proximal intracranial stenosis. 3. Left greater than right cervical carotid artery atherosclerosis without significant stenosis. 4. Widely patent vertebral arteries. 5. 4.3 cm ascending aortic aneurysm. Recommend annual imaging followup by CTA or MRA. This recommendation follows 2010 ACCF/AHA/AATS/ACR/ASA/SCA/SCAI/SIR/STS/SVM Guidelines for the Diagnosis and Management of Patients with Thoracic Aortic Disease. Circulation. 2010; 121: S010-X323. Aortic aneurysm NOS (ICD10-I71.9) 6. Aortic Atherosclerosis (ICD10-I70.0). Preliminary findings of no emergent LVO and negative CTP were communicated to Dr. Wilford Corner at 7:26 pm on 11/23/2019 by text page via the [facility] messaging system. Electronically Signed   By: Sebastian Ache M.D.   On: 11/23/2019 19:50   DG Chest Portable 1 View  Result Date: 11/23/2019 CLINICAL DATA:  Acute shortness of breath EXAM: PORTABLE CHEST 1 VIEW COMPARISON:  07/06/2015 chest radiograph FINDINGS: UPPER limits normal heart size again noted. LEFT retrocardiac opacity appears similar to remote radiograph, but may represent atelectasis, scarring or airspace disease/consolidation. No definite pleural effusion or pneumothorax. No acute bony abnormalities. RIGHT shoulder arthroplasty changes noted. IMPRESSION: LEFT retrocardiac opacity which appears somewhat remote radiograph, but may represent atelectasis, scarring or airspace disease/consolidation. Electronically Signed   By: Harmon Pier M.D.   On: 11/23/2019 20:13   CT HEAD CODE STROKE WO CONTRAST  Result Date: 11/23/2019 CLINICAL DATA:  Code stroke. Left-sided weakness and slurred speech. EXAM: CT HEAD WITHOUT CONTRAST TECHNIQUE: Contiguous axial images were obtained from the base of the skull through the vertex without intravenous contrast. COMPARISON:  12/18/2018 FINDINGS:  Brain: There is no evidence of acute infarct, intracranial hemorrhage, mass, midline shift, or extra-axial fluid collection. Patchy to confluent hypodensities in the cerebral white matter bilaterally are unchanged and nonspecific but compatible with moderate chronic small vessel ischemic disease. Mild-to-moderate lateral and third ventriculomegaly is unchanged and may reflect central predominant cerebral atrophy although normal pressure hydrocephalus is not excluded in the appropriate clinical setting. Vascular: Calcified atherosclerosis at the skull base. No hyperdense vessel. Skull: No fracture or focal osseous lesion.  Sinuses/Orbits: Left greater than right maxillary sinus mucosal thickening with moderate volume fluid on the left. Mild bilateral ethmoid air cell mucosal thickening. Clear mastoid air cells bilateral cataract extraction. Other: None. ASPECTS [facility] Stroke Program Early CT Score) - Ganglionic level infarction (caudate, lentiform nuclei, internal capsule, insula, M1-M3 cortex): 7 - Supraganglionic infarction (M4-M6 cortex): 3 Total score (0-10 with 10 being normal): 10 IMPRESSION: 1. No evidence of acute intracranial abnormality. 2. ASPECTS is 10. 3. Moderate chronic small vessel ischemic disease and unchanged ventriculomegaly. These results were communicated to Dr. Lorraine Lax at 7:02 pm on 11/23/2019 by text page via the [facility] messaging system. Electronically Signed   By: Logan Bores M.D.   On: 11/23/2019 41:64   86 year old past medical history severe dementia hypertension polymyalgia rheumatica brought in as an acute code stroke from memory care unit with an unclear last known normal time for potential stroke evaluation due to lethargy and on EMS evaluation potential left facial droop. Unknown LKN not a candidate for TPA. Not a candidate for intervention due to no emergent LVO on imaging.Most likely toxic metabolic encephalopathy versus behavioral changes with dementia given the history of  advanced dementia.   Impression: Multifactorial toxic metabolic encephalopathy Behavioral changes with dementia Evaluate for stroke-obtain MRI  Recommendations: Brain MRI when able to Correct any derangements Frequent neurochecks No stroke work-up unless MRI of the brain shows a stroke. Neurology will  with you.    Laurey Morale, MSN, NP-C [facility] [phone]   Addendum: MRI was negative for stroke, does show increased atrophy from previous MRI in 2014. Attending neurologist's note to follow    11/24/2019, 11:05 AM

## 2019-11-24 NOTE — Plan of Care (Signed)
UA still pending. Called RN again (had called last night as well) to collect sample and send for UA. In and out OK if needed.  Amie Portland MD Neurology

## 2019-11-25 LAB — BASIC METABOLIC PANEL
Anion gap: 13 (ref 5–15)
BUN: 22 mg/dL (ref 8–23)
CO2: 22 mmol/L (ref 22–32)
Calcium: 8.7 mg/dL — ABNORMAL LOW (ref 8.9–10.3)
Chloride: 102 mmol/L (ref 98–111)
Creatinine, Ser: 0.89 mg/dL (ref 0.44–1.00)
GFR calc Af Amer: >60 mL/min (ref >60)
GFR calc non Af Amer: 60 mL/min — ABNORMAL LOW (ref >60)
Glucose, Bld: 127 mg/dL — ABNORMAL HIGH (ref 70–99)
Potassium: 3.7 mmol/L (ref 3.5–5.1)
Sodium: 137 mmol/L (ref 135–145)

## 2019-11-25 LAB — CBC WITH DIFFERENTIAL/PLATELET
Abs Immature Granulocytes: 0.01 10*3/uL (ref 0.00–0.07)
Basophils Absolute: 0 10*3/uL (ref 0.0–0.1)
Basophils Relative: 1 %
Eosinophils Absolute: 0 10*3/uL (ref 0.0–0.5)
Eosinophils Relative: 0 %
HCT: 48.2 % — ABNORMAL HIGH (ref 36.0–46.0)
Hemoglobin: 16.2 g/dL — ABNORMAL HIGH (ref 12.0–15.0)
Immature Granulocytes: 0 %
Lymphocytes Relative: 17 %
Lymphs Abs: 0.9 10*3/uL (ref 0.7–4.0)
MCH: 30.2 pg (ref 26.0–34.0)
MCHC: 33.6 g/dL (ref 30.0–36.0)
MCV: 89.8 fL (ref 80.0–100.0)
Monocytes Absolute: 1.2 10*3/uL — ABNORMAL HIGH (ref 0.1–1.0)
Monocytes Relative: 23 %
Neutro Abs: 3.2 10*3/uL (ref 1.7–7.7)
Neutrophils Relative %: 59 %
Platelets: 192 10*3/uL (ref 150–400)
RBC: 5.37 MIL/uL — ABNORMAL HIGH (ref 3.87–5.11)
RDW: 13.4 % (ref 11.5–15.5)
WBC: 5.4 10*3/uL (ref 4.0–10.5)
nRBC: 0 % (ref 0.0–0.2)

## 2019-11-25 LAB — GLUCOSE, CAPILLARY: Glucose-Capillary: 116 mg/dL — ABNORMAL HIGH (ref 70–99)

## 2019-11-25 LAB — PROCALCITONIN: Procalcitonin: <0.1 ng/mL

## 2019-11-25 MED ORDER — AMOXICILLIN-POT CLAVULANATE 875-125 MG PO TABS
1.0000 | ORAL_TABLET | Freq: Two times a day (BID) | ORAL | Status: DC
  Administered 2019-11-26 – 2019-11-28 (×4): 1 via ORAL
  Filled 2019-11-25 (×5): qty 1

## 2019-11-25 MED ORDER — DIPHENHYDRAMINE-ZINC ACETATE 2-0.1 % EX CREA
TOPICAL_CREAM | Freq: Three times a day (TID) | CUTANEOUS | Status: DC | PRN
  Filled 2019-11-25: qty 28

## 2019-11-25 NOTE — Progress Notes (Addendum)
PROGRESS NOTE    Shirley [name]  NWG:[id number] DOB: 1934/09/14 DOA: 11/23/2019 PCP: Roderick Pee, MD (Inactive)   Brief Narrative:  HPI: Shirley [name] is a 85 y.o. female with medical history significant for dementia, anxiety, hypertension, and polymyalgia rheumatica, now presenting to the emergency department from her nursing facility for evaluation of increased confusion and left-sided weakness.  History is somewhat unclear but these changes were apparently noticed at around 6 PM with last seen in usual state possibly within an hour prior to that.  At her baseline, the patient is reportedly oriented to self only, ambulates with a walker, and does not have dysarthria though her speech is usually nonsensical.  Patient is unable to contribute much to the history due to her clinical condition.  ED Course: Upon arrival to the ED, patient is found to be afebrile, saturating well on room air, and with stable blood pressure.  Chest x-ray is notable for a left retrocardiac opacity that appears similar to remote prior.  Chemistry panel is unremarkable.  CBC with mild polycythemia.  Noncontrast head CT is negative for acute intracranial abnormality.  CTA head and neck negative for emergent LVO and no evidence for significant ischemia on CT perfusion study.  Neurology was consulted by the ED physician and recommends MRI brain, further stroke work-up if positive for acute infarct, but otherwise evaluate for causes of suspected toxic metabolic encephalopathy.  Assessment & Plan:   Principal Problem:   Acute encephalopathy Active Problems:   Essential hypertension   Moderate dementia (HCC)   Ascending aortic aneurysm (HCC)   1. Acute encephalopathy, ?left-sided deficits  - Presents from memory unit at her nursing home with increased confusion and possible left-sided weakness, noted to have subtle left lower facial weakness in ED with no acute findings on head CT, no emergent LVO on CTA H&N, and no  evidence for significant ischemia on CT perfusion study  - Underwent MRI of the brain which was negative for any acute stroke.  Neurology signed off.  TSH normal as well as ammonia.  Low B12.  Wonder if this is just progression of her severe dementia as she only lives in a memory unit.  2. Dementia with behavioral changes/?  Delirium - Family reported to ED staff that patient is oriented to self only and ambulates with a walker at baseline.  She remains delirious and encephalopathic.  Source is still remains unknown.  She had fever yesterday but she has remained afebrile prior to that and after that.  No source of infection has been found yet.  UA negative.  Continue delirium precautions.    3. Hypertension  - Blood pressure labile but mostly controlled.  Continue metoprolol and as needed hydralazine.   4. Anxiety  - Continue Celexa    5. Ascending aortic aneurysm  - 4.3 cm aneurysm noted incidentally in ED  - Annual follow-up imaging recommended  6.  Vitamin B12 deficiency: Received 1 dose of IM vitamin B12 injection on 11/24/2019.  Will need oral vitamin B12 replacement at the time of discharge.  7.  Fever: She had fever of 101.9 at 3:30 PM on 11/24/2019.  Blood culture were drawn and are negative so far.  No leukocytosis.  Has remained afebrile since then.  Will follow blood culture.  8.  Gingivitis/dental infection: She has several teeth which seem to be infected and has obvious tendinitis.  We will start her on Augmentin twice daily.  DVT prophylaxis: Lovenox Code Status: Full code Family  Communication: None present at bedside when I saw her this morning.  Received a message from nurse that patient's daughter wants to receive a phone call from me.  Before I call her, I received another message that patient's son is at the bedside and he is demanding a face-to-face update from a physician.  I went to the bedside in the afternoon again and spoke to him at the bedside and answered several  questions. Disposition Plan: We will observe for another 24 hours.  If afebrile, she will be cleared for discharge.  Consulting case manager to see if her previous facility is capable of taking care of her.  Estimated body mass index is 21.83 kg/m as calculated from the following:   Height as of 09/01/18: 5\' 5"  (1.651 m).   Weight as of this encounter: 59.5 kg.      Nutritional status:               Consultants:   Neurology  Procedures:   None  Antimicrobials:   None   Subjective: Seen and examined.  Patient once again remains pleasantly confused.  Objective: Vitals:   11/24/19 2306 11/25/19 0435 11/25/19 0437 11/25/19 0806  BP: (!) 160/76 (!) 155/74  134/77  Pulse:  80  84  Resp: 18     Temp:  100 F (37.8 C)  (!) 97.4 F (36.3 C)  TempSrc:  Oral  Oral  SpO2: 94% 92%  93%  Weight:   59.5 kg     Intake/Output Summary (Last 24 hours) at 11/25/2019 1329 Last data filed at 11/24/2019 2000 Gross per 24 hour  Intake --  Output 550 ml  Net -550 ml   Filed Weights   11/23/19 1800 11/24/19 0051 11/25/19 0437  Weight: 62.4 kg 61.2 kg 59.5 kg    Examination:  General exam: Appears calm and pleasant but confused Respiratory system: Clear to auscultation. Respiratory effort normal. Cardiovascular system: S1 & S2 heard, RRR. No JVD, murmurs, rubs, gallops or clicks. No pedal edema. Gastrointestinal system: Abdomen is nondistended, soft and nontender. No organomegaly or masses felt. Normal bowel sounds heard. Central nervous system: Alert but disoriented.  No focal deficit. Extremities: Symmetric 5 x 5 power. Skin: No rashes, lesions or ulcers.  Psychiatry: Unable to assess due to delirium.    Data Reviewed: I have personally reviewed following labs and imaging studies  CBC: Recent Labs  Lab 11/23/19 1849 11/23/19 1850 11/24/19 0438 11/25/19 0313  WBC 6.6  --  6.9 5.4  NEUTROABS 4.7  --   --  3.2  HGB 16.8* 18.0* 15.7* 16.2*  HCT 51.3* 53.0*  47.7* 48.2*  MCV 92.9  --  92.6 89.8  PLT 192  --  181 192   Basic Metabolic Panel: Recent Labs  Lab 11/23/19 1849 11/23/19 1850 11/24/19 0438 11/25/19 0313  NA 136 137 136 137  K 3.9 3.9 4.3 3.7  CL 100 99 102 102  CO2 24  --  21* 22  GLUCOSE 154* 154* 105* 127*  BUN 19 23 20 22   CREATININE 0.93 0.80 0.81 0.89  CALCIUM 9.1  --  8.7* 8.7*   GFR: CrCl cannot be calculated (Unknown ideal weight.). Liver Function Tests: Recent Labs  Lab 11/23/19 1849  AST 29  ALT 18  ALKPHOS 79  BILITOT 0.6  PROT 7.4  ALBUMIN 3.9   No results for input(s): LIPASE, AMYLASE in the last 168 hours. Recent Labs  Lab 11/24/19 0001  AMMONIA 16   Coagulation Profile:  Recent Labs  Lab 11/23/19 1849  INR 1.0   Cardiac Enzymes: No results for input(s): CKTOTAL, CKMB, CKMBINDEX, TROPONINI in the last 168 hours. BNP (last 3 results) No results for input(s): PROBNP in the last 8760 hours. HbA1C: No results for input(s): HGBA1C in the last 72 hours. CBG: Recent Labs  Lab 11/23/19 1845 11/24/19 0843 11/24/19 1604 11/25/19 0804  GLUCAP 157* 98 101* 116*   Lipid Profile: No results for input(s): CHOL, HDL, LDLCALC, TRIG, CHOLHDL, LDLDIRECT in the last 72 hours. Thyroid Function Tests: Recent Labs    11/24/19 0001  TSH 0.752   Anemia Panel: Recent Labs    11/24/19 0001  VITAMINB12 171*   Sepsis Labs: Recent Labs  Lab 11/25/19 0833  PROCALCITON <0.10    Recent Results (from the past 240 hour(s))  SARS CORONAVIRUS 2 (TAT 6-24 HRS) Nasopharyngeal Nasopharyngeal Swab     Status: None   Collection Time: 11/23/19  9:38 PM   Specimen: Nasopharyngeal Swab  Result Value Ref Range Status   SARS Coronavirus 2 NEGATIVE NEGATIVE Final    Comment: (NOTE) SARS-CoV-2 target nucleic acids are NOT DETECTED. The SARS-CoV-2 RNA is generally detectable in upper and lower respiratory specimens during the acute phase of infection. Negative results do not preclude SARS-CoV-2 infection, do  not rule out co-infections with other pathogens, and should not be used as the sole basis for treatment or other patient management decisions. Negative results must be combined with clinical observations, patient history, and epidemiological information. The expected result is Negative. Fact Sheet for Patients: HairSlick.no Fact Sheet for Healthcare Providers: quierodirigir.com This test is not yet approved or cleared by the [place] FDA and  has been authorized for detection and/or diagnosis of SARS-CoV-2 by FDA under an Emergency Use Authorization (EUA). This EUA will remain  in effect (meaning this test can be used) for the duration of the COVID-19 declaration under Section 56 4(b)(1) of the Act, 21 U.S.C. section 360bbb-3(b)(1), unless the authorization is terminated or revoked sooner. Performed at [facility], [address] [place], [place] [address]   Culture, blood (routine x 2)     Status: None (Preliminary result)   Collection Time: 11/24/19  4:05 PM   Specimen: BLOOD LEFT HAND  Result Value Ref Range Status   Specimen Description BLOOD LEFT HAND  Final   Special Requests   Final    BOTTLES DRAWN AEROBIC AND ANAEROBIC Blood Culture adequate volume   Culture   Final    NO GROWTH < 24 HOURS Performed at [facility], [address] [place], [place] [address]    Report Status PENDING  Incomplete  Culture, blood (routine x 2)     Status: None (Preliminary result)   Collection Time: 11/24/19  4:15 PM   Specimen: BLOOD RIGHT HAND  Result Value Ref Range Status   Specimen Description BLOOD RIGHT HAND  Final   Special Requests   Final    BOTTLES DRAWN AEROBIC ONLY Blood Culture results may not be optimal due to an inadequate volume of blood received in culture bottles   Culture   Final    NO GROWTH < 24 HOURS Performed at [facility], [address], [place], [place] [address]    Report Status  PENDING  Incomplete      Radiology Studies: CT Code Stroke CTA Head W/WO contrast  Result Date: 11/23/2019 CLINICAL DATA:  Left-sided weakness. EXAM: CT ANGIOGRAPHY HEAD AND NECK CT PERFUSION BRAIN TECHNIQUE: Multidetector  CT imaging of the head and neck was performed using the standard protocol during bolus administration of intravenous contrast. Multiplanar CT image reconstructions and MIPs were obtained to evaluate the vascular anatomy. Carotid stenosis measurements (when applicable) are obtained utilizing NASCET criteria, using the distal internal carotid diameter as the denominator. Multiphase CT imaging of the brain was performed following IV bolus contrast injection. Subsequent parametric perfusion maps were calculated using RAPID software. CONTRAST:  OMNIPAQUE IOHEXOL 350 MG/ML SOLN COMPARISON:  None. FINDINGS: CTA NECK FINDINGS Aortic arch: Standard 3 vessel aortic arch with atherosclerotic plaque but no significant arch vessel origin stenosis. Small to moderate volume soft plaque partially visualized in the descending thoracic aorta. Aneurysmal dilatation of the ascending aorta with maximal diameter of 4.3 cm, new from a 2016 chest CT. Right carotid system: Patent with minimal calcified plaque at the carotid bifurcation. No evidence of stenosis or dissection. Left carotid system: Patent with heavily calcified plaque at the carotid bifurcation resulting in less than 50% stenosis of the distal common carotid artery and ICA origin. Vertebral arteries: Patent with the right being mildly dominant. No evidence of significant stenosis or dissection. Skeleton: Mild-to-moderate disc and moderate facet degeneration in the cervical spine. Mild superior endplate compression fractures/Schmorl's node deformities at T2, T3, T4, and T5, all chronic in appearance. Other neck: No evidence of cervical lymphadenopathy or mass. Upper chest: No apical lung consolidation or mass. Review of the MIP images confirms  the above findings CTA HEAD FINDINGS Anterior circulation: The internal carotid arteries are patent from skull base to carotid termini with mild nonstenotic calcified plaque bilaterally. ACAs and MCAs are patent without evidence of proximal branch occlusion or significant A1 or M1 stenosis, however there is prominent branch vessel atherosclerosis including severe stenoses of the M3 and more distal MCA branch vessels as well as distal A2 and more distal ACAs. No aneurysm is identified. Posterior circulation: The intracranial vertebral arteries are widely patent to the basilar. Patent PICA and SCA origins are visualized bilaterally. The basilar artery is widely patent. There are small right and large left posterior communicating arteries with a fetal origin of the left PCA. There are moderate stenoses of the left posterior communicating artery and both P2 segments, and they were severe bilateral P3 stenoses. No aneurysm is identified. Venous sinuses: Poorly evaluated due to contrast timing though grossly patent. Anatomic variants: Low fetal left PCA. Review of the MIP images confirms the above findings CT Brain Perfusion Findings: ASPECTS: 10 CBF (<30%) Volume: 0mL Perfusion (Tmax>6.0s) volume: 0mL Mismatch Volume: n/a Infarction Location: No infarct evident on CTP. IMPRESSION: 1. No emergent large vessel occlusion, and no evidence of significant ischemia on CTP. 2. Advanced intracranial atherosclerosis primarily involving the distal branch vessels. No flow limiting proximal intracranial stenosis. 3. Left greater than right cervical carotid artery atherosclerosis without significant stenosis. 4. Widely patent vertebral arteries. 5. 4.3 cm ascending aortic aneurysm. Recommend annual imaging followup by CTA or MRA. This recommendation follows 2010 ACCF/AHA/AATS/ACR/ASA/SCA/SCAI/SIR/STS/SVM Guidelines for the Diagnosis and Management of Patients with Thoracic Aortic Disease. Circulation. 2010; 121: Z610-R604. Aortic  aneurysm NOS (ICD10-I71.9) 6. Aortic Atherosclerosis (ICD10-I70.0). Preliminary findings of no emergent LVO and negative CTP were communicated to Dr. Wilford Corner at 7:26 pm on 11/23/2019 by text page via the [facility] messaging system. Electronically Signed   By: Sebastian Ache M.D.   On: 11/23/2019 19:50   CT Code Stroke CTA Neck W/WO contrast  Result Date: 11/23/2019 CLINICAL DATA:  Left-sided weakness. EXAM: CT ANGIOGRAPHY HEAD AND NECK  CT PERFUSION BRAIN TECHNIQUE: Multidetector CT imaging of the head and neck was performed using the standard protocol during bolus administration of intravenous contrast. Multiplanar CT image reconstructions and MIPs were obtained to evaluate the vascular anatomy. Carotid stenosis measurements (when applicable) are obtained utilizing NASCET criteria, using the distal internal carotid diameter as the denominator. Multiphase CT imaging of the brain was performed following IV bolus contrast injection. Subsequent parametric perfusion maps were calculated using RAPID software. CONTRAST:  OMNIPAQUE IOHEXOL 350 MG/ML SOLN COMPARISON:  None. FINDINGS: CTA NECK FINDINGS Aortic arch: Standard 3 vessel aortic arch with atherosclerotic plaque but no significant arch vessel origin stenosis. Small to moderate volume soft plaque partially visualized in the descending thoracic aorta. Aneurysmal dilatation of the ascending aorta with maximal diameter of 4.3 cm, new from a 2016 chest CT. Right carotid system: Patent with minimal calcified plaque at the carotid bifurcation. No evidence of stenosis or dissection. Left carotid system: Patent with heavily calcified plaque at the carotid bifurcation resulting in less than 50% stenosis of the distal common carotid artery and ICA origin. Vertebral arteries: Patent with the right being mildly dominant. No evidence of significant stenosis or dissection. Skeleton: Mild-to-moderate disc and moderate facet degeneration in the cervical spine. Mild superior  endplate compression fractures/Schmorl's node deformities at T2, T3, T4, and T5, all chronic in appearance. Other neck: No evidence of cervical lymphadenopathy or mass. Upper chest: No apical lung consolidation or mass. Review of the MIP images confirms the above findings CTA HEAD FINDINGS Anterior circulation: The internal carotid arteries are patent from skull base to carotid termini with mild nonstenotic calcified plaque bilaterally. ACAs and MCAs are patent without evidence of proximal branch occlusion or significant A1 or M1 stenosis, however there is prominent branch vessel atherosclerosis including severe stenoses of the M3 and more distal MCA branch vessels as well as distal A2 and more distal ACAs. No aneurysm is identified. Posterior circulation: The intracranial vertebral arteries are widely patent to the basilar. Patent PICA and SCA origins are visualized bilaterally. The basilar artery is widely patent. There are small right and large left posterior communicating arteries with a fetal origin of the left PCA. There are moderate stenoses of the left posterior communicating artery and both P2 segments, and they were severe bilateral P3 stenoses. No aneurysm is identified. Venous sinuses: Poorly evaluated due to contrast timing though grossly patent. Anatomic variants: Low fetal left PCA. Review of the MIP images confirms the above findings CT Brain Perfusion Findings: ASPECTS: 10 CBF (<30%) Volume: 0mL Perfusion (Tmax>6.0s) volume: 0mL Mismatch Volume: n/a Infarction Location: No infarct evident on CTP. IMPRESSION: 1. No emergent large vessel occlusion, and no evidence of significant ischemia on CTP. 2. Advanced intracranial atherosclerosis primarily involving the distal branch vessels. No flow limiting proximal intracranial stenosis. 3. Left greater than right cervical carotid artery atherosclerosis without significant stenosis. 4. Widely patent vertebral arteries. 5. 4.3 cm ascending aortic aneurysm.  Recommend annual imaging followup by CTA or MRA. This recommendation follows 2010 ACCF/AHA/AATS/ACR/ASA/SCA/SCAI/SIR/STS/SVM Guidelines for the Diagnosis and Management of Patients with Thoracic Aortic Disease. Circulation. 2010; 121: R604-V409. Aortic aneurysm NOS (ICD10-I71.9) 6. Aortic Atherosclerosis (ICD10-I70.0). Preliminary findings of no emergent LVO and negative CTP were communicated to Dr. Wilford Corner at 7:26 pm on 11/23/2019 by text page via the [facility] messaging system. Electronically Signed   By: Sebastian Ache M.D.   On: 11/23/2019 19:50   MR BRAIN WO CONTRAST  Result Date: 11/24/2019 CLINICAL DATA:  Encephalopathy. Increased confusion and left-sided weakness.  History of dementia. EXAM: MRI HEAD WITHOUT CONTRAST TECHNIQUE: Multiplanar, multiecho pulse sequences of the brain and surrounding structures were obtained without intravenous contrast. COMPARISON:  Head CT, CTA, and CTP 11/23/2019. Head MRI 01/30/2013. FINDINGS: Brain: No acute infarct, mass, midline shift, or extra-axial fluid collection is identified. There is a single chronic microhemorrhage in the parasagittal right frontoparietal region/cingulate gyrus. Patchy to confluent T2 hyperintensities primarily involve the periventricular and deep cerebral white matter bilaterally and have progressed from 2014, nonspecific but compatible with moderate chronic small vessel ischemic disease. Ventriculomegaly has progressed from 2014 and is favored to reflect advanced central predominant cerebral atrophy rather than hydrocephalus, with severe bilateral mesial temporal lobe atrophy noted. Vascular: Major intracranial vascular flow voids are preserved. Skull and upper cervical spine: Unremarkable bone marrow signal. Sinuses/Orbits: Bilateral cataract extraction. Left greater than right maxillary sinus mucosal thickening with moderate volume fluid on the left. Mild bilateral ethmoid air cell mucosal thickening. Clear mastoid air cells. Other: None.  IMPRESSION: 1. No acute intracranial abnormality. 2. Moderate chronic small vessel ischemic disease, progressed from 2014. 3. Advanced cerebral atrophy with severe bilateral mesial temporal lobe atrophy, progressed from 2014. Electronically Signed   By: Logan Bores M.D.   On: 11/24/2019 14:55   CT Code Stroke Cerebral Perfusion with contrast  Result Date: 11/23/2019 CLINICAL DATA:  Left-sided weakness. EXAM: CT ANGIOGRAPHY HEAD AND NECK CT PERFUSION BRAIN TECHNIQUE: Multidetector CT imaging of the head and neck was performed using the standard protocol during bolus administration of intravenous contrast. Multiplanar CT image reconstructions and MIPs were obtained to evaluate the vascular anatomy. Carotid stenosis measurements (when applicable) are obtained utilizing NASCET criteria, using the distal internal carotid diameter as the denominator. Multiphase CT imaging of the brain was performed following IV bolus contrast injection. Subsequent parametric perfusion maps were calculated using RAPID software. CONTRAST:  113mL OMNIPAQUE IOHEXOL 350 MG/ML SOLN COMPARISON:  None. FINDINGS: CTA NECK FINDINGS Aortic arch: Standard 3 vessel aortic arch with atherosclerotic plaque but no significant arch vessel origin stenosis. Small to moderate volume soft plaque partially visualized in the descending thoracic aorta. Aneurysmal dilatation of the ascending aorta with maximal diameter of 4.3 cm, new from a 2016 chest CT. Right carotid system: Patent with minimal calcified plaque at the carotid bifurcation. No evidence of stenosis or dissection. Left carotid system: Patent with heavily calcified plaque at the carotid bifurcation resulting in less than 50% stenosis of the distal common carotid artery and ICA origin. Vertebral arteries: Patent with the right being mildly dominant. No evidence of significant stenosis or dissection. Skeleton: Mild-to-moderate disc and moderate facet degeneration in the cervical spine. Mild  superior endplate compression fractures/Schmorl's node deformities at T2, T3, T4, and T5, all chronic in appearance. Other neck: No evidence of cervical lymphadenopathy or mass. Upper chest: No apical lung consolidation or mass. Review of the MIP images confirms the above findings CTA HEAD FINDINGS Anterior circulation: The internal carotid arteries are patent from skull base to carotid termini with mild nonstenotic calcified plaque bilaterally. ACAs and MCAs are patent without evidence of proximal branch occlusion or significant A1 or M1 stenosis, however there is prominent branch vessel atherosclerosis including severe stenoses of the M3 and more distal MCA branch vessels as well as distal A2 and more distal ACAs. No aneurysm is identified. Posterior circulation: The intracranial vertebral arteries are widely patent to the basilar. Patent PICA and SCA origins are visualized bilaterally. The basilar artery is widely patent. There are small right and large left posterior communicating  arteries with a fetal origin of the left PCA. There are moderate stenoses of the left posterior communicating artery and both P2 segments, and they were severe bilateral P3 stenoses. No aneurysm is identified. Venous sinuses: Poorly evaluated due to contrast timing though grossly patent. Anatomic variants: Low fetal left PCA. Review of the MIP images confirms the above findings CT Brain Perfusion Findings: ASPECTS: 10 CBF (<30%) Volume: 0mL Perfusion (Tmax>6.0s) volume: 0mL Mismatch Volume: n/a Infarction Location: No infarct evident on CTP. IMPRESSION: 1. No emergent large vessel occlusion, and no evidence of significant ischemia on CTP. 2. Advanced intracranial atherosclerosis primarily involving the distal branch vessels. No flow limiting proximal intracranial stenosis. 3. Left greater than right cervical carotid artery atherosclerosis without significant stenosis. 4. Widely patent vertebral arteries. 5. 4.3 cm ascending aortic  aneurysm. Recommend annual imaging followup by CTA or MRA. This recommendation follows 2010 ACCF/AHA/AATS/ACR/ASA/SCA/SCAI/SIR/STS/SVM Guidelines for the Diagnosis and Management of Patients with Thoracic Aortic Disease. Circulation. 2010; 121: M578-I696: [id number]. Aortic aneurysm NOS (ICD10-I71.9) 6. Aortic Atherosclerosis (ICD10-I70.0). Preliminary findings of no emergent LVO and negative CTP were communicated to Dr. Wilford CornerArora at 7:26 pm on 11/23/2019 by text page via the [facility] system. Electronically Signed   By: Sebastian AcheAllen  Grady M.D.   On: 11/23/2019 19:50   DG Chest Portable 1 View  Result Date: 11/23/2019 CLINICAL DATA:  Acute shortness of breath EXAM: PORTABLE CHEST 1 VIEW COMPARISON:  07/06/2015 chest radiograph FINDINGS: UPPER limits normal heart size again noted. LEFT retrocardiac opacity appears similar to remote radiograph, but may represent atelectasis, scarring or airspace disease/consolidation. No definite pleural effusion or pneumothorax. No acute bony abnormalities. RIGHT shoulder arthroplasty changes noted. IMPRESSION: LEFT retrocardiac opacity which appears somewhat remote radiograph, but may represent atelectasis, scarring or airspace disease/consolidation. Electronically Signed   By: Harmon PierJeffrey  Hu M.D.   On: 11/23/2019 20:13   CT HEAD CODE STROKE WO CONTRAST  Result Date: 11/23/2019 CLINICAL DATA:  Code stroke. Left-sided weakness and slurred speech. EXAM: CT HEAD WITHOUT CONTRAST TECHNIQUE: Contiguous axial images were obtained from the base of the skull through the vertex without intravenous contrast. COMPARISON:  12/18/2018 FINDINGS: Brain: There is no evidence of acute infarct, intracranial hemorrhage, mass, midline shift, or extra-axial fluid collection. Patchy to confluent hypodensities in the cerebral white matter bilaterally are unchanged and nonspecific but compatible with moderate chronic small vessel ischemic disease. Mild-to-moderate lateral and third ventriculomegaly is unchanged and  may reflect central predominant cerebral atrophy although normal pressure hydrocephalus is not excluded in the appropriate clinical setting. Vascular: Calcified atherosclerosis at the skull base. No hyperdense vessel. Skull: No fracture or focal osseous lesion. Sinuses/Orbits: Left greater than right maxillary sinus mucosal thickening with moderate volume fluid on the left. Mild bilateral ethmoid air cell mucosal thickening. Clear mastoid air cells bilateral cataract extraction. Other: None. ASPECTS [facility]([place] Stroke Program Early CT Score) - Ganglionic level infarction (caudate, lentiform nuclei, internal capsule, insula, M1-M3 cortex): 7 - Supraganglionic infarction (M4-M6 cortex): 3 Total score (0-10 with 10 being normal): 10 IMPRESSION: 1. No evidence of acute intracranial abnormality. 2. ASPECTS is 10. 3. Moderate chronic small vessel ischemic disease and unchanged ventriculomegaly. These results were communicated to Dr. Laurence SlateAroor at 7:02 pm on 11/23/2019 by text page via the [facility] messaging system. Electronically Signed   By: Sebastian AcheAllen  Grady M.D.   On: 11/23/2019 19:02    Scheduled Meds: . citalopram  10 mg Oral Daily  . donepezil  10 mg Oral QHS  . enoxaparin (LOVENOX) injection  40 mg Subcutaneous Q24H  .  metoprolol tartrate  2.5 mg Intravenous Q8H  . sodium chloride flush  3 mL Intravenous Once  . sodium chloride flush  3 mL Intravenous Q12H  . sodium chloride flush  3 mL Intravenous Q12H   Continuous Infusions: . sodium chloride       LOS: 1 day   Time spent: 33 minutes   Hughie Closs, MD [facility]  11/25/2019, 1:29 PM   To contact the attending provider between 7A-7P or the covering provider during after hours 7P-7A, please log into the web site [email or web address] and use password [email or web address].

## 2019-11-25 NOTE — Progress Notes (Signed)
Per staff nurse Asencion Partridge, [place] states origin is from bed bugs prior to hospitalization. Will evaluate for treatment.

## 2019-11-25 NOTE — Progress Notes (Signed)
Communication sent to MD requesting knowledge of pt skin origin d/t several petite scabs, some with questionable burrowing. MD states will evaluate.

## 2019-11-25 NOTE — Progress Notes (Signed)
PT Cancellation Note  Patient Details Name: Shirley [name] MRN: [id number] DOB: 04-05-1934   Cancelled Treatment:    Reason Eval/Treat Not Completed: Fatigue/lethargy limiting ability to participate;Other (comment).  Pt was seen for attempted eval and was both sleepy and irritable with being disturbed.  Will try an earlier time in the AM to see her.   Ramond Dial 11/25/2019, 4:21 PM   Mee Hives, PT MS Acute Rehab Dept. Number: [place]

## 2019-11-25 NOTE — Progress Notes (Signed)
UA sent to lab

## 2019-11-26 DIAGNOSIS — I712 Thoracic aortic aneurysm, without rupture: Secondary | ICD-10-CM

## 2019-11-26 LAB — GLUCOSE, CAPILLARY: Glucose-Capillary: 120 mg/dL — ABNORMAL HIGH (ref 70–99)

## 2019-11-26 MED ORDER — CYANOCOBALAMIN 1000 MCG/ML IJ SOLN
1000.0000 ug | Freq: Every day | INTRAMUSCULAR | Status: DC
  Administered 2019-11-26 – 2019-11-27 (×2): 1000 ug via INTRAMUSCULAR
  Filled 2019-11-26 (×2): qty 1

## 2019-11-26 MED ORDER — DONEPEZIL HCL 10 MG PO TABS: 10.0000 mg | ORAL_TABLET | Freq: Every day | ORAL | Status: DC

## 2019-11-26 MED ORDER — METOPROLOL SUCCINATE ER 25 MG PO TB24
25.0000 mg | ORAL_TABLET | Freq: Every day | ORAL | Status: DC
  Administered 2019-11-26 – 2019-11-27 (×2): 25 mg via ORAL
  Filled 2019-11-26 (×2): qty 1

## 2019-11-26 MED ORDER — LIP MEDEX EX OINT
TOPICAL_OINTMENT | CUTANEOUS | Status: DC | PRN
  Filled 2019-11-26: qty 7

## 2019-11-26 MED ORDER — CITALOPRAM HYDROBROMIDE 20 MG PO TABS
20.0000 mg | ORAL_TABLET | Freq: Every day | ORAL | Status: DC
  Administered 2019-11-27 – 2019-11-28 (×2): 20 mg via ORAL
  Filled 2019-11-26 (×2): qty 1

## 2019-11-26 MED ORDER — CITALOPRAM HYDROBROMIDE 20 MG PO TABS: 20.0000 mg | ORAL_TABLET | Freq: Every day | ORAL | Status: DC

## 2019-11-26 NOTE — Consult Note (Signed)
Consultation Note Date: 11/27/19   Patient Name: Shirley [name]  DOB: Oct 08, 1934  MRN: [id number]  Age / Sex: 85 y.o., female  PCP: Dorena Cookey, MD (Inactive) Referring Physician: Emeterio Reeve, DO  Reason for Consultation: Establishing goals of care  HPI/Patient Profile: 85 y.o. female  with past medical history of dementia, anxiety, hypertension, polymyalgia rheumatica admitted on 11/23/2019 with increased confusion and left-sided weakness. CT head, CTA head, and MRI negative for acute findings. Neurology signed off. Patient with acute metabolic encephalopathy, questionable progression of dementia and/or vitamin B12 deficiency. Gingivitis receiving Augmentin with recommendation for outpatient follow-up with dentist. Generalized deconditioning. Palliative medicine consultation for goals of care.   Clinical Assessment and Goals of Care:  I have reviewed medical records, discussed with care team, and assessed the patient at bedside. She is awake and alert to self. Otherwise disoriented to place, time, situation. Pleasantly confused. Will follow commands. No s/s of pain or discomfort.   Shortly after, met with son Sherren Mocha) at bedside to discuss goals of care.   I introduced Palliative Medicine as specialized medical care for people living with serious illness. It focuses on providing relief from the symptoms and stress of a serious illness. The goal is to improve quality of life for both the patient and the family.  Ms. Sawchuk has lived at [facility] unit since September 2018. Diagnosed with dementia about 4 years ago. Prior to admission, patient requiring walker for ambulation and minimal assist with ADL's. She was able to feed self and has actually gained weight since transitioned into memory care. Son jokes that she "lives back I her 70's and 1's" and with a "happy" dementia.   Discussed  events leading up to admission and course of hospitalization including diagnoses, interventions, plan of care. Discussed chronic, progressive nature of dementia and disease trajectory. Todd understands expectations of progressive dementia.  I attempted to elicit values and goals of care important to the patient. Advanced directives, concepts specific to code status, artifical feeding and hydration were discussed. Sherren Mocha confirms that his mother has a documented living will and he is HCPOA. Sherren Mocha shares his mother's wishes against resuscitation and life-prolonging interventions. Aryianna expressed her wishes to him prior to worsening dementia.   MOST form completed with Sherren Mocha. Patient/family wishes include DNR/DNI, limited additional interventions including re-hospitalization and non-invasive forms of oxygen if indicated, determine use or limitation of ABX, IVF if indicated, and feeding tube for time trial (dependent on conversations with providers at that time). Durable DNR completed. Copies of MOST form and durable DNR made for chart and son.   Discussed plan for his mother to discharge to SNF for rehab. Palliative Care services outpatient were explained and offered. Son agrees with outpatient palliative referral.   Questions and concerns were addressed.  Hard Choices booklet left for review. PMT contact information given.     SUMMARY OF RECOMMENDATIONS    MOST form completed with son/HCPOA Sherren Mocha). Patient/family decisions include: DNR/DNI, limited additional interventions including rehospitalization if indicated, determine use  or limitation of ABX use, IVF if indicated, and time trial feeding tube (dependent on conversations with providers based on her condition). Durable DNR completed. Copies of MOST and durable given to son and placed in chart.   Requested copy of patient's living will for EMR. (Patient may discharge before this is received).   Continue medical management and current plan of care.    Plan is SNF for rehab.   Outpatient palliative referral.   Code Status/Advance Care Planning:  DNR  Symptom Management:   Per attending  Palliative Prophylaxis:   Aspiration, Delirium Protocol, Oral Care and Turn Reposition  Additional Recommendations (Limitations, Scope, Preferences):  Full Scope Treatment  Psycho-social/Spiritual:   Desire for further Chaplaincy support: yes  Additional Recommendations: Caregiving  Support/Resources  Prognosis:   Unable to determine  Discharge Planning: To Be Determined      Primary Diagnoses: Present on Admission: . Acute encephalopathy . Moderate dementia ([place]) . Essential hypertension . Ascending aortic aneurysm ([place])   I have reviewed the medical record, interviewed the patient and family, and examined the patient. The following aspects are pertinent.  Past Medical History:  Diagnosis Date  . Arthritis    "right shoulder; that's why I had to have it replaced"  . Hypertension   . Osteoarthritis   . Osteoporosis   . PMR (polymyalgia rheumatica) (HCC)    Social History   Socioeconomic History  . Marital status: Married    Spouse name: Not on file  . Number of children: 3  . Years of education: Not on file  . Highest education level: Not on file  Occupational History  . Occupation: retired  Tobacco Use  . Smoking status: Former Smoker    Packs/day: 0.50    Years: 40.00    Pack years: 20.00    Types: Cigarettes    Quit date: 12/06/1991    Years since quitting: 27.9  . Smokeless tobacco: Never Used  Substance and Sexual Activity  . Alcohol use: Yes    Alcohol/week: 0.0 standard drinks    Comment: occ  . Drug use: No  . Sexual activity: Never  Other Topics Concern  . Not on file  Social History Narrative   Retired   Married   Alcohol use-no   Drug use-no   Regular Exercise-yes   Current Smoker         Social Determinants of Radio broadcast assistant Strain:   . Difficulty of Paying Living  Expenses: Not on file  Food Insecurity:   . Worried About Charity fundraiser in the Last Year: Not on file  . Ran Out of Food in the Last Year: Not on file  Transportation Needs:   . Lack of Transportation (Medical): Not on file  . Lack of Transportation (Non-Medical): Not on file  Physical Activity:   . Days of Exercise per Week: Not on file  . Minutes of Exercise per Session: Not on file  Stress:   . Feeling of Stress : Not on file  Social Connections:   . Frequency of Communication with Friends and Family: Not on file  . Frequency of Social Gatherings with Friends and Family: Not on file  . Attends Religious Services: Not on file  . Active Member of Clubs or Organizations: Not on file  . Attends Archivist Meetings: Not on file  . Marital Status: Not on file   Family History  Problem Relation Age of Onset  . ALS Father   .  Hypertension Sister    Scheduled Meds: . amoxicillin-clavulanate  1 tablet Oral Q12H  . citalopram  20 mg Oral Daily  . cyanocobalamin  1,000 mcg Intramuscular Q1400  . donepezil  10 mg Oral QHS  . enoxaparin (LOVENOX) injection  40 mg Subcutaneous Q24H  . metoprolol succinate  25 mg Oral QHS  . sodium chloride flush  3 mL Intravenous Once  . sodium chloride flush  3 mL Intravenous Q12H  . sodium chloride flush  3 mL Intravenous Q12H   Continuous Infusions: . sodium chloride    . sodium chloride 100 mL/hr at 11/28/19 0025   PRN Meds:.sodium chloride, acetaminophen **OR** acetaminophen, diphenhydrAMINE-zinc acetate, hydrALAZINE, lip balm, ondansetron **OR** ondansetron (ZOFRAN) IV, senna-docusate, sodium chloride flush Medications Prior to Admission:  Prior to Admission medications   Medication Sig Start Date End Date Taking? Authorizing Provider  citalopram (CELEXA) 10 MG tablet TAKE 2 TABLETS EVERY DAY Patient taking differently: Take 20 mg by mouth See admin instructions. Take one tablet by mouth on Monday, Wednesday and Fridays. 04/17/18   Yes Dorena Cookey, MD  donepezil (ARICEPT) 10 MG tablet TAKE 1 TABLET EVERY DAY Patient taking differently: Take 10 mg by mouth at bedtime.  11/29/17  Yes Cameron Sprang, MD  metoprolol succinate (TOPROL-XL) 25 MG 24 hr tablet Take 25 mg by mouth at bedtime. 08/20/18  Yes [provider]  triamcinolone cream (KENALOG) 0.1 % Compound 1;1 with Eucerin cream and apply bid prn rash. Patient taking differently: Apply 1 application topically 2 (two) times daily. Apply to arms and back 04/25/18  Yes Burchette, Alinda Sierras, MD   No Known Allergies Review of Systems  Unable to perform ROS: Dementia   Physical Exam Vitals and nursing note reviewed.  Constitutional:      General: She is awake.     Appearance: She is ill-appearing.  HENT:     Head: Normocephalic and atraumatic.  Cardiovascular:     Rate and Rhythm: Regular rhythm.  Pulmonary:     Effort: No tachypnea, accessory muscle usage or respiratory distress.     Breath sounds: Normal breath sounds.  Skin:    General: Skin is warm and dry.  Neurological:     Mental Status: She is alert.     Comments: Oriented to person, otherwise disoriented with pleasant confusion  Psychiatric:        Attention and Perception: She is inattentive.        Speech: Speech is delayed.    Vital Signs: BP (!) 194/101 (BP Location: Left Arm)   Pulse 83   Temp 99 F (37.2 C) (Oral)   Resp 16   Wt 59.5 kg   SpO2 94%   BMI 21.83 kg/m  Pain Scale: 0-10 POSS *See Group Information*: 1-Acceptable,Awake and alert Pain Score: Asleep   SpO2: SpO2: 94 % O2 Device:SpO2: 94 % O2 Flow Rate: .   IO: Intake/output summary:   Intake/Output Summary (Last 24 hours) at 11/28/2019 1054 Last data filed at 11/28/2019 0400 Gross per 24 hour  Intake 1223.28 ml  Output 300 ml  Net 923.28 ml    LBM: Last BM Date: 11/27/19 Baseline Weight: Weight: 62.4 kg Most recent weight: Weight: 59.5 kg     Palliative Assessment/Data: PPS 50%   Flowsheet Rows      Most Recent Value  Intake Tab  Referral Department  Hospitalist  Unit at Time of Referral  Med/Surg Unit  Palliative Care Primary Diagnosis  Neurology  Palliative Care Type  New Palliative care  Reason for referral  Clarify Goals of Care  Date first seen by Palliative Care  11/27/19  Clinical Assessment  Palliative Performance Scale Score  50%  Psychosocial & Spiritual Assessment  Palliative Care Outcomes  Patient/Family meeting held?  Yes  Who was at the meeting?  son  Palliative Care Outcomes  Clarified goals of care, Provided end of life care assistance, Provided advance care planning, Provided psychosocial or spiritual support, Changed CPR status, Completed durable DNR, Linked to palliative care logitudinal support, ACP counseling assistance      Time In/Out: 1010-1120 Time Total: 31mn Greater than 50%  of this time was spent counseling and coordinating care related to the above assessment and plan.  Signed by:  MIhor Dow DNP, FNP-C Palliative Medicine Team  Phone: [phone]: [phone]  Please contact Palliative Medicine Team phone at [phone] questions and concerns.  For individual provider: See AShea Evans

## 2019-11-26 NOTE — NC FL2 (Signed)
[place] MEDICAID FL2 LEVEL OF CARE SCREENING TOOL     IDENTIFICATION  Patient Name: Shirley [name] Birthdate: [birth year] Sex: female Admission Date (Current Location): 11/23/2019  [facility] and [place] Number:  Producer, television/film/video and Address:  [facility] [place], [address], [place], [place] [address]      Provider Number: [id number]  Attending Physician Name and Address:  Glade Lloyd, MD  Relative Name and Phone Number:       Current Level of Care: Hospital Recommended Level of Care: Skilled Nursing Facility Prior Approval Number:    Date Approved/Denied:   PASRR Number: [id number]  Discharge Plan: SNF    Current Diagnoses: Patient Active Problem List   Diagnosis Date Noted  . Acute encephalopathy 11/23/2019  . Ascending aortic aneurysm (HCC) 11/23/2019  . Moderate dementia (HCC) 08/10/2015  . Frequent falls 05/08/2015  . Anxiety 05/08/2015  . Anxiety state, unspecified 03/06/2014  . Short-term memory loss 12/30/2013  . Lumbar degenerative disc disease 08/22/2011  . Polymyalgia rheumatica (HCC) 05/16/2008  . Essential hypertension 03/12/2008  . Arthritis, senescent 03/12/2008  . MYALGIA 03/12/2008  . OSTEOPOROSIS 03/12/2008    Orientation RESPIRATION BLADDER Height & Weight     Self  Normal Incontinent Weight: 59.5 kg Height:     BEHAVIORAL SYMPTOMS/MOOD NEUROLOGICAL BOWEL NUTRITION STATUS      Incontinent Diet(refer to discharge summary)  AMBULATORY STATUS COMMUNICATION OF NEEDS Skin   Extensive Assist Verbally Other (Comment)(scabs generalized on body)                       Personal Care Assistance Level of Assistance  Bathing, Feeding, Dressing Bathing Assistance: Maximum assistance Feeding assistance: Independent Dressing Assistance: Maximum assistance     Functional Limitations Info  Sight, Speech, Hearing Sight Info: Adequate Hearing Info: Adequate Speech Info: Adequate    SPECIAL CARE FACTORS FREQUENCY   PT (By licensed PT), OT (By licensed OT)     PT Frequency: 5 x  / week OT Frequency: 5 x / week            Contractures Contractures Info: Not present    Additional Factors Info  Code Status, Allergies Code Status Info: Full code Allergies Info: NKDA           Current Medications (11/26/2019):  This is the current hospital active medication list Current Facility-Administered Medications  Medication Dose Route Frequency Provider Last Rate Last Admin  . 0.9 %  sodium chloride infusion  250 mL Intravenous PRN Opyd, Lavone Neri, MD      . acetaminophen (TYLENOL) tablet 650 mg  650 mg Oral Q6H PRN Opyd, Lavone Neri, MD       Or  . acetaminophen (TYLENOL) suppository 650 mg  650 mg Rectal Q6H PRN Opyd, Lavone Neri, MD   650 mg at 11/25/19 0538  . amoxicillin-clavulanate (AUGMENTIN) 875-125 MG per tablet 1 tablet  1 tablet Oral Q12H Hughie Closs, MD      . Melene Muller ON 11/27/2019] citalopram (CELEXA) tablet 20 mg  20 mg Oral Daily Alekh, Kshitiz, MD      . cyanocobalamin ((VITAMIN B-12)) injection 1,000 mcg  1,000 mcg Intramuscular Q1400 Alekh, Kshitiz, MD      . diphenhydrAMINE-zinc acetate (BENADRYL) 2-0.1 % cream   Topical TID PRN Hughie Closs, MD      . donepezil (ARICEPT) tablet 10 mg  10 mg Oral QHS Opyd, Lavone Neri, MD      . enoxaparin (LOVENOX) injection  40 mg  40 mg Subcutaneous Q24H Darliss Cheney, MD   40 mg at 11/25/19 1455  . hydrALAZINE (APRESOLINE) injection 10 mg  10 mg Intravenous Q6H PRN Darliss Cheney, MD   10 mg at 11/24/19 1659  . lip balm (CARMEX) ointment   Topical PRN Darliss Cheney, MD      . metoprolol succinate (TOPROL-XL) 24 hr tablet 25 mg  25 mg Oral QHS Alekh, Kshitiz, MD      . ondansetron (ZOFRAN) tablet 4 mg  4 mg Oral Q6H PRN Opyd, Ilene Qua, MD       Or  . ondansetron (ZOFRAN) injection 4 mg  4 mg Intravenous Q6H PRN Opyd, Ilene Qua, MD      . senna-docusate (Senokot-S) tablet 1 tablet  1 tablet Oral QHS PRN Opyd, Ilene Qua, MD      . sodium chloride  flush (NS) 0.9 % injection 3 mL  3 mL Intravenous Once Opyd, Ilene Qua, MD      . sodium chloride flush (NS) 0.9 % injection 3 mL  3 mL Intravenous Q12H Opyd, Ilene Qua, MD   3 mL at 11/24/19 2048  . sodium chloride flush (NS) 0.9 % injection 3 mL  3 mL Intravenous Q12H Opyd, Ilene Qua, MD   3 mL at 11/25/19 2118  . sodium chloride flush (NS) 0.9 % injection 3 mL  3 mL Intravenous PRN Opyd, Ilene Qua, MD         Discharge Medications: Please see discharge summary for a list of discharge medications.  Relevant Imaging Results:  Relevant Lab Results:   Additional Information [phone], COVID  negative  Sharin Mons, RN

## 2019-11-26 NOTE — Care Management Important Message (Signed)
Important Message  Patient Details  Name: Shirley [name] MRN: [id number] Date of Birth: 12-31-1933   Medicare Important Message Given:  Yes     Argenis Kumari [address] 11/26/2019, 3:29 PM

## 2019-11-26 NOTE — Progress Notes (Addendum)
Patient ID: Shirley [name], female   DOB: 03-08-1934, 85 y.o.   MRN: [id number]  PROGRESS NOTE    Shirley [name]  OVF:[id number] DOB: 1934/08/25 DOA: 11/23/2019 PCP: Dorena Cookey, MD (Inactive)   Brief Narrative:  85 year old female with history of dementia, anxiety, hypertension, polymyalgia rheumatica presented from her memory care unit for evaluation of increased confusion and left-sided weakness.  On presentation, CT head was negative for intracranial abnormality.  CTA head and neck negative for emergent LVO and no evidence of significant ischemia on CT perfusion study.  Neurology was consulted.  MRI was negative for acute stroke.  Neurology signed off.  Assessment & Plan:   Acute metabolic encephalopathy with question of left-sided deficits -Presented from memory care unit with increased confusion and possible left-sided weakness, noted to have subtle left lower facial weakness in ED -CT head was negative for intracranial abnormality.  CTA head and neck negative for emergent LVO and no evidence of significant ischemia on CT perfusion study.  -Neurology was consulted.  MRI was negative for acute stroke.  Neurology signed off. -TSH normal; ammonia normal.  Low B12 levels. -Wonder if this is progression of her severe dementia versus vitamin B12 deficiency  Dementia with behavioral changes/delirium? -Fall precautions.  Monitor mental status. -No further episodes of fever.  No source of infection found except for gingivitis.  UA negative.  Continue delirium precautions.  Hypertension -  Monitor blood pressure.  Switch to oral metoprolol.  Anxiety  -continue Celexa  Ascending aortic aneurysm -Incidental finding of 4.3 cm aneurysm.  Annual follow-up imaging recommended  Vitamin B12 deficiency -Vitamin B12 level 171.  Continue IM vitamin B12 supplementation while in the hospital and switch to oral on discharge  Fever -At the present spike on 11/24/2019.  No temperature spikes since  then.  Cultures have been negative so far.  No source of infection found except for gingivitis.  Continue Augmentin  Gingivitis/dental infection:  -She has several teeth which seem to be infected and has obvious gingivitis.  Continue Augmentin.  Outpatient follow-up with dentist.  Generalized conditioning -Overall prognosis is guarded to poor.  Currently listed as full code.  Will request medical care evaluation for goals of care discussions. -Patient presented from a memory care unit.  Apparently, family is interested in a different memory care unit.  Social worker following.   DVT prophylaxis: Lovenox Code Status: Full Family Communication: None at bedside  disposition Plan: SNF/memory care unit once bed is available  Consultants: Neurology  Procedures: None  Antimicrobials: None   Subjective: Patient seen and examined at bedside.  She is awake but confused.  No overnight fever, vomiting or agitation reported by nursing staff.  Objective: Vitals:   11/25/19 2300 11/25/19 2359 11/26/19 0458 11/26/19 0824  BP: (!) 164/105 130/88 (!) 156/106 130/75  Pulse: (!) 110 (!) 103  84  Resp:    19  Temp: 97.9 F (36.6 C)   98.7 F (37.1 C)  TempSrc: Axillary     SpO2: 94%   93%  Weight:        Intake/Output Summary (Last 24 hours) at 11/26/2019 1122 Last data filed at 11/25/2019 1812 Gross per 24 hour  Intake 120 ml  Output --  Net 120 ml   Filed Weights   11/23/19 1800 11/24/19 0051 11/25/19 0437  Weight: 62.4 kg 61.2 kg 59.5 kg    Examination:  General exam: Appears calm and comfortable.  Elderly female lying in bed.  No distress.  Awake  but confused. Respiratory system: Bilateral decreased breath sounds at bases Cardiovascular system: S1 & S2 heard, tachycardic Gastrointestinal system: Abdomen is nondistended, soft and nontender. Normal bowel sounds heard. Extremities: No cyanosis, clubbing, edema    Data Reviewed: I have personally reviewed following labs and  imaging studies  CBC: Recent Labs  Lab 11/23/19 1849 11/23/19 1850 11/24/19 0438 11/25/19 0313  WBC 6.6  --  6.9 5.4  NEUTROABS 4.7  --   --  3.2  HGB 16.8* 18.0* 15.7* 16.2*  HCT 51.3* 53.0* 47.7* 48.2*  MCV 92.9  --  92.6 89.8  PLT 192  --  181 192   Basic Metabolic Panel: Recent Labs  Lab 11/23/19 1849 11/23/19 1850 11/24/19 0438 11/25/19 0313  NA 136 137 136 137  K 3.9 3.9 4.3 3.7  CL 100 99 102 102  CO2 24  --  21* 22  GLUCOSE 154* 154* 105* 127*  BUN 19 23 20 22   CREATININE 0.93 0.80 0.81 0.89  CALCIUM 9.1  --  8.7* 8.7*   GFR: CrCl cannot be calculated (Unknown ideal weight.). Liver Function Tests: Recent Labs  Lab 11/23/19 1849  AST 29  ALT 18  ALKPHOS 79  BILITOT 0.6  PROT 7.4  ALBUMIN 3.9   No results for input(s): LIPASE, AMYLASE in the last 168 hours. Recent Labs  Lab 11/24/19 0001  AMMONIA 16   Coagulation Profile: Recent Labs  Lab 11/23/19 1849  INR 1.0   Cardiac Enzymes: No results for input(s): CKTOTAL, CKMB, CKMBINDEX, TROPONINI in the last 168 hours. BNP (last 3 results) No results for input(s): PROBNP in the last 8760 hours. HbA1C: No results for input(s): HGBA1C in the last 72 hours. CBG: Recent Labs  Lab 11/23/19 1845 11/24/19 0843 11/24/19 1604 11/25/19 0804 11/26/19 0817  GLUCAP 157* 98 101* 116* 120*   Lipid Profile: No results for input(s): CHOL, HDL, LDLCALC, TRIG, CHOLHDL, LDLDIRECT in the last 72 hours. Thyroid Function Tests: Recent Labs    11/24/19 0001  TSH 0.752   Anemia Panel: Recent Labs    11/24/19 0001  VITAMINB12 171*   Sepsis Labs: Recent Labs  Lab 11/25/19 0833  PROCALCITON <0.10    Recent Results (from the past 240 hour(s))  SARS CORONAVIRUS 2 (TAT 6-24 HRS) Nasopharyngeal Nasopharyngeal Swab     Status: None   Collection Time: 11/23/19  9:38 PM   Specimen: Nasopharyngeal Swab  Result Value Ref Range Status   SARS Coronavirus 2 NEGATIVE NEGATIVE Final    Comment:  (NOTE) SARS-CoV-2 target nucleic acids are NOT DETECTED. The SARS-CoV-2 RNA is generally detectable in upper and lower respiratory specimens during the acute phase of infection. Negative results do not preclude SARS-CoV-2 infection, do not rule out co-infections with other pathogens, and should not be used as the sole basis for treatment or other patient management decisions. Negative results must be combined with clinical observations, patient history, and epidemiological information. The expected result is Negative. Fact Sheet for Patients: HairSlick.nohttps://www.fda.gov/media/138098/download Fact Sheet for Healthcare Providers: quierodirigir.comhttps://www.fda.gov/media/138095/download This test is not yet approved or cleared by the [place] FDA and  has been authorized for detection and/or diagnosis of SARS-CoV-2 by FDA under an Emergency Use Authorization (EUA). This EUA will remain  in effect (meaning this test can be used) for the duration of the COVID-19 declaration under Section 56 4(b)(1) of the Act, 21 U.S.C. section 360bbb-3(b)(1), unless the authorization is terminated or revoked sooner. Performed at [facility], [address] [place], [place]  [address]   Culture, blood (routine x 2)     Status: None (Preliminary result)   Collection Time: 11/24/19  4:05 PM   Specimen: BLOOD LEFT HAND  Result Value Ref Range Status   Specimen Description BLOOD LEFT HAND  Final   Special Requests   Final    BOTTLES DRAWN AEROBIC AND ANAEROBIC Blood Culture adequate volume   Culture   Final    NO GROWTH 2 DAYS Performed at [facility], [address] [place], [place] [address]    Report Status PENDING  Incomplete  Culture, blood (routine x 2)     Status: None (Preliminary result)   Collection Time: 11/24/19  4:15 PM   Specimen: BLOOD RIGHT HAND  Result Value Ref Range Status   Specimen Description BLOOD RIGHT HAND  Final   Special Requests   Final    BOTTLES DRAWN AEROBIC ONLY Blood  Culture results may not be optimal due to an inadequate volume of blood received in culture bottles   Culture   Final    NO GROWTH 2 DAYS Performed at [facility], [address] [place], [place] [address]    Report Status PENDING  Incomplete         Radiology Studies: MR BRAIN WO CONTRAST  Result Date: 11/24/2019 CLINICAL DATA:  Encephalopathy. Increased confusion and left-sided weakness. History of dementia. EXAM: MRI HEAD WITHOUT CONTRAST TECHNIQUE: Multiplanar, multiecho pulse sequences of the brain and surrounding structures were obtained without intravenous contrast. COMPARISON:  Head CT, CTA, and CTP 11/23/2019. Head MRI 01/30/2013. FINDINGS: Brain: No acute infarct, mass, midline shift, or extra-axial fluid collection is identified. There is a single chronic microhemorrhage in the parasagittal right frontoparietal region/cingulate gyrus. Patchy to confluent T2 hyperintensities primarily involve the periventricular and deep cerebral white matter bilaterally and have progressed from 2014, nonspecific but compatible with moderate chronic small vessel ischemic disease. Ventriculomegaly has progressed from 2014 and is favored to reflect advanced central predominant cerebral atrophy rather than hydrocephalus, with severe bilateral mesial temporal lobe atrophy noted. Vascular: Major intracranial vascular flow voids are preserved. Skull and upper cervical spine: Unremarkable bone marrow signal. Sinuses/Orbits: Bilateral cataract extraction. Left greater than right maxillary sinus mucosal thickening with moderate volume fluid on the left. Mild bilateral ethmoid air cell mucosal thickening. Clear mastoid air cells. Other: None. IMPRESSION: 1. No acute intracranial abnormality. 2. Moderate chronic small vessel ischemic disease, progressed from 2014. 3. Advanced cerebral atrophy with severe bilateral mesial temporal lobe atrophy, progressed from 2014. Electronically Signed   By: Sebastian Ache M.D.    On: 11/24/2019 14:55        Scheduled Meds: . amoxicillin-clavulanate  1 tablet Oral Q12H  . citalopram  10 mg Oral Daily  . donepezil  10 mg Oral QHS  . enoxaparin (LOVENOX) injection  40 mg Subcutaneous Q24H  . metoprolol tartrate  2.5 mg Intravenous Q8H  . sodium chloride flush  3 mL Intravenous Once  . sodium chloride flush  3 mL Intravenous Q12H  . sodium chloride flush  3 mL Intravenous Q12H   Continuous Infusions: . sodium chloride            Glade Lloyd, MD [facility] 11/26/2019, 11:22 AM

## 2019-11-26 NOTE — Evaluation (Signed)
Clinical/Bedside Swallow Evaluation Patient Details  Name: Shirley [name] MRN: [id number] Date of Birth: 01-Oct-1934  Today's Date: 11/26/2019 Time: SLP Start Time (ACUTE ONLY): 1555 SLP Stop Time (ACUTE ONLY): 1610 SLP Time Calculation (min) (ACUTE ONLY): 15 min  Past Medical History:  Past Medical History:  Diagnosis Date  . Arthritis    "right shoulder; that's why I had to have it replaced"  . Hypertension   . Osteoarthritis   . Osteoporosis   . PMR (polymyalgia rheumatica) (HCC)    Past Surgical History:  Past Surgical History:  Procedure Laterality Date  . CATARACT EXTRACTION W/ INTRAOCULAR LENS  IMPLANT, BILATERAL     2008   . HIP PINNING,CANNULATED Right 10/20/2013   Procedure: CANNULATED HIP PINNING;  Surgeon: Marybelle Killings, MD;  Location: [facility];  Service: Orthopedics;  Laterality: Right;  . KYPHOPLASTY N/A 02/28/2013   Procedure: KYPHOPLASTY;  Surgeon: Ophelia Charter, MD;  Location: [facility];  Service: Neurosurgery;  Laterality: N/A;  Lumbar two Kyphoplasty  . POSTERIOR FUSION LUMBAR SPINE  03/28/12  . SHOULDER ARTHROSCOPY W/ ROTATOR CUFF REPAIR  ~ 01/2011   right  . TOTAL SHOULDER REPLACEMENT  06/2011   right  . VAGINAL HYSTERECTOMY  1970's   HPI:  85 y.o. female with medical history significant for dementia, anxiety, hypertension, and polymyalgia rheumatica, now presenting to the emergency department from memory unit at her nursing facility for evaluation of increased confusion and left-sided weakness. CT head and MRI brain negative for acute stroke; acute encephalopathy   Assessment / Plan / Recommendation Clinical Impression  Pt presents with poor dentition, and generalized weakness. Low vocal intensity, but quality was clear. Pt has difficulty following commands due to to cognitive impairment, and this is the primary issue related to her swallowing difficulty. Pt does not exhibit overt s/s aspiration on any consistency, but does demonstrate poor attention and  inability to follow directions consistently. Recommend downgrading diet to Dys2/thin with crushed meds due to cognitively based dysphagia. SLP will follow briefly to assess diet tolerance and provide education. RN and NT notified. Safe swallow precautions posted at [facility].    SLP Visit Diagnosis: Dysphagia, unspecified (R13.10)    Aspiration Risk  Mild aspiration risk    Diet Recommendation Dysphagia 2 (Fine chop);Thin liquid   Liquid Administration via: Cup Medication Administration: Crushed with puree Supervision: Staff to assist with self feeding;Full supervision/cueing for compensatory strategies Compensations: Slow rate;Small sips/bites Postural Changes: Seated upright at 90 degrees;Remain upright for at least 30 minutes after po intake    Other  Recommendations Oral Care Recommendations: Oral care QID   Follow up Recommendations 24 hour supervision/assistance;Skilled Nursing facility      Frequency and Duration min 1 x/week  1 week;2 weeks       Prognosis Prognosis for Safe Diet Advancement: Guarded Barriers to Reach Goals: Cognitive deficits      Swallow Study   General Date of Onset: 11/23/19 HPI: 85 y.o. female with medical history significant for dementia, anxiety, hypertension, and polymyalgia rheumatica, now presenting to the emergency department from memory unit at her nursing facility for evaluation of increased confusion and left-sided weakness. CT head and MRI brain negative for acute stroke; acute encephalopathy Type of Study: Bedside Swallow Evaluation Previous Swallow Assessment: none Diet Prior to this Study: Regular;Thin liquids Temperature Spikes Noted: No Respiratory Status: Room air History of Recent Intubation: No Behavior/Cognition: Alert;Requires cueing;Doesn't follow directions;Confused Oral Cavity Assessment: Dry Oral Care Completed by SLP: No Oral Cavity - Dentition:  Poor condition Self-Feeding Abilities: Total assist Patient Positioning:  Upright in bed Baseline Vocal Quality: Low vocal intensity Volitional Cough: Cognitively unable to elicit Volitional Swallow: Unable to elicit    Oral/Motor/Sensory Function Overall Oral Motor/Sensory Function: Generalized oral weakness   Ice Chips Ice chips: Not tested   Thin Liquid Thin Liquid: Within functional limits Presentation: Cup Other Comments: unable to demonstrate ability to use straw    Nectar Thick Nectar Thick Liquid: Not tested   Honey Thick Honey Thick Liquid: Not tested   Puree Puree: Within functional limits Presentation: Spoon   Solid     Solid: Within functional limits Presentation: Harless Litten, [facility], CCC-SLP Speech Language Pathologist Office: [phone] Pager: [phone]  [name] 11/26/2019,4:20 PM

## 2019-11-26 NOTE — Progress Notes (Signed)
   Vital Signs MEWS/VS Documentation      11/26/2019 0800 11/26/2019 0824 11/26/2019 1706 11/26/2019 1707   MEWS Score:  1  0  3  2   MEWS Score Color:  Green  Green  Yellow  Yellow   Resp:  --  19  19  --   Pulse:  --  84  (!) 118  (!) 117   BP:  --  130/75  (!) 159/108  --   Temp:  --  98.7 F (37.1 C)  (!) 100.5 F (38.1 C)  100.3 F (37.9 C)   O2 Device:  Room Air  --  --  --   Level of Consciousness:  Alert  --  --  --           Glenford Bayley 11/26/2019,5:41 PM Administered PRN Hydralazine for elevated BP, Tylenol administered for elevated temp. No s/s distress at this time, will continue to monitor.

## 2019-11-26 NOTE — Evaluation (Addendum)
Occupational Therapy Evaluation Patient Details Name: Shirley BealBarbara B Mendibles MRN: [id number] DOB: 1934/04/05 Today's Date: 11/26/2019    History of Present Illness 85 y.o. female with medical history significant for dementia, anxiety, hypertension, and polymyalgia rheumatica, now presenting to the emergency department from memory unit at her nursing facility for evaluation of increased confusion and left-sided weakness. CT head and MRI brain negative for acute stroke; acute encephalopathy. In ED pt noted to have ?bites/small scabs that MD determined were bites from bed bugs. No actual bugs were found on pt (per RN report)   Clinical Impression   Pt admitted for above and limited by problem list below, including impaired balance, decreased activity tolerance, generalized weakness.  Noted cognitive deficits at baseline, today oriented to self, following 1 step commands inconsistently with increased time, and nonsensical speech. She currently requires min assist +2 using RW for transfers, total assist +2 for toileting (after [facility] max-total assist for all other ADLs. Per chart, patient using RW for mobility but today is unfamiliar with RW and requires multimodal, hand over hand cueing to utilize. Based on performance today, recommend SNF level rehab at dc with 24/7 support. Will follow acutely to maximize return to PLOF with ADLs/mobility.     Follow Up Recommendations  Supervision/Assistance - 24 hour;SNF    Equipment Recommendations  None recommended by OT    Recommendations for Other Services       Precautions / Restrictions Precautions Precautions: Fall Restrictions Weight Bearing Restrictions: No      Mobility Bed Mobility Overal bed mobility: Needs Assistance Bed Mobility: Supine to Sit;Sit to Supine     Supine to sit: Mod assist;HOB elevated Sit to supine: Min guard   General bed mobility comments: multimodal commands for coming to sit with limited following of commands,  required manual facilitation and then she assisted; return to bed, near need for assist to raise legs onto bed; +2 total to scoot to middle and HOB  Transfers Overall transfer level: Needs assistance Equipment used: Rolling walker (2 wheeled) Transfers: Sit to/from [employer]Stand;Stand Pivot Transfers Sit to Stand: Min assist;+2 physical assistance;+2 safety/equipment Stand pivot transfers: Min assist;+2 physical assistance;+2 safety/equipment       General transfer comment: multimodal cueing required with hand over hand support for placement of hands, min assist +2 for stand and pivot    Balance Overall balance assessment: Needs assistance Sitting-balance support: No upper extremity supported;Feet supported Sitting balance-Leahy Scale: Poor Sitting balance - Comments: posterior and left lean; can hold midline for <30 seconds with closeguarding, then loses balance Postural control: Posterior lean;Left lateral lean Standing balance support: Bilateral upper extremity supported;During functional activity Standing balance-Leahy Scale: Poor Standing balance comment: posterior lean initially, relaint on BUE and external support                           ADL either performed or assessed with clinical judgement   ADL Overall ADL's : Needs assistance/impaired     Grooming: Wash/dry face;Set up;Sitting   Upper Body Bathing: Moderate assistance;Sitting   Lower Body Bathing: Total assistance;Sit to/from stand;+2 for physical assistance   Upper Body Dressing : Sitting;Total assistance   Lower Body Dressing: Total assistance;+2 for physical assistance;Sit to/from stand   Toilet Transfer: Minimal assistance;+2 for physical assistance;+2 for safety/equipment;Stand-pivot;RW;BSC   Toileting- Clothing Manipulation and Hygiene: Total assistance;+2 for physical assistance;Sit to/from stand       Functional mobility during ADLs: Minimal assistance;+2 for physical assistance;+2 for  safety/equipment;Rolling walker;Cueing for sequencing General ADL Comments: limited by cognition, generalized weakness      Vision   Vision Assessment?: No apparent visual deficits     Perception     Praxis      Pertinent Vitals/Pain Pain Assessment: No/denies pain Faces Pain Scale: No hurt     Hand Dominance     Extremity/Trunk Assessment Upper Extremity Assessment Upper Extremity Assessment: Generalized weakness(appears symmetrical)   Lower Extremity Assessment Lower Extremity Assessment: Defer to PT evaluation   Cervical / Trunk Assessment Cervical / Trunk Assessment: Kyphotic   Communication Communication Communication: Expressive difficulties(non-sensical speech is baseline per chart)   Cognition Arousal/Alertness: Awake/alert Behavior During Therapy: WFL for tasks assessed/performed Overall Cognitive Status: No family/caregiver present to determine baseline cognitive functioning                                 General Comments: resides in memory care unit; admitted with encephalopathy vs progression of dementia   General Comments  HR max 120 with transfers/standing     Exercises     Shoulder Instructions      Home Living Family/patient expects to be discharged to:: Other (Comment)                                 Additional Comments: from memory care unit      Prior Functioning/Environment Level of Independence: Needs assistance  Gait / Transfers Assistance Needed: per chart was ambulating with RW (although RW did not seem familiar to her today) ADL's / Homemaking Assistance Needed: anticipate needing assist for ADLs, pt unable to report PLOF            OT Problem List: Decreased strength;Decreased activity tolerance;Impaired balance (sitting and/or standing);Decreased cognition;Decreased coordination;Decreased safety awareness;Decreased knowledge of use of DME or AE;Decreased knowledge of precautions;Pain      OT  Treatment/Interventions: Self-care/ADL training;Energy conservation;DME and/or AE instruction;Therapeutic activities;Balance training;Cognitive remediation/compensation    OT Goals(Current goals can be found in the care plan section) Acute Rehab OT Goals Patient Stated Goal: unable to express OT Goal Formulation: Patient unable to participate in goal setting Time For Goal Achievement: 12/10/19 Potential to Achieve Goals: Fair  OT Frequency: Min 2X/week   Barriers to D/C:            Co-evaluation PT/OT/SLP Co-Evaluation/Treatment: Yes Reason for Co-Treatment: Necessary to address cognition/behavior during functional activity;For patient/therapist safety;To address functional/ADL transfers PT goals addressed during session: Mobility/safety with mobility;Proper use of DME OT goals addressed during session: ADL's and self-care      AM-PAC OT "6 Clicks" Daily Activity     Outcome Measure Help from another person eating meals?: Total Help from another person taking care of personal grooming?: A Lot Help from another person toileting, which includes using toliet, bedpan, or urinal?: Total Help from another person bathing (including washing, rinsing, drying)?: Total Help from another person to put on and taking off regular upper body clothing?: A Lot Help from another person to put on and taking off regular lower body clothing?: Total 6 Click Score: 8   End of Session Equipment Utilized During Treatment: Gait belt;Rolling walker Nurse Communication: Mobility status  Activity Tolerance: Patient tolerated treatment well Patient left: in bed;with call bell/phone within reach;with bed alarm set  OT Visit Diagnosis: Other abnormalities of gait and mobility (R26.89);Muscle weakness (generalized) (M62.81);Other symptoms and signs involving  cognitive function;Cognitive communication deficit (R41.841)                Time: 1740-8144 OT Time Calculation (min): 19 min Charges:  OT General  Charges $OT Visit: 1 Visit OT Evaluation $OT Eval Moderate Complexity: 1 Mod  Barry Brunner, OT Acute Rehabilitation Services Pager [phone] Office [phone]   Chancy Milroy 11/26/2019, 10:28 AM

## 2019-11-26 NOTE — Evaluation (Addendum)
Physical Therapy Evaluation Patient Details Name: UNITY LUEPKE MRN: [id number] DOB: 1934/01/28 Today's Date: 11/26/2019   History of Present Illness  85 y.o. female with medical history significant for dementia, anxiety, hypertension, and polymyalgia rheumatica, now presenting to the emergency department from memory unit at her nursing facility for evaluation of increased confusion and left-sided weakness. CT head and MRI brain negative for acute stroke; acute encephalopathy. In ED pt noted to have ?bites/small scabs that MD determined were bites from bed bugs. No actual bugs were found on pt (per RN report)  Clinical Impression   Pt admitted with above diagnosis. Per chart, pt was ambulating with RW at memory care unit. Today, RW did not seem familiar to patient and she required facilitation (including at times hand-over-hand guidance) for use of RW. Patient able to stand-pivot to [facility] with min assist and stood for >1 minute for peri-care after having a BM.  Pt currently with functional limitations due to the deficits listed below (see PT Problem List). Pt may benefit from skilled PT to return to walking with assist safely to allow discharge to the venue listed below.    ADDENDUM (13:14)--Notified by Care Manager that family does not want pt to return to the facility she came from. Anticipate patient can benefit from skilled PT when she transitions to a new facility.     Follow Up Recommendations SNF; Supervision/Assistance - 24 hour   Equipment Recommendations  None recommended by PT    Recommendations for Other Services       Precautions / Restrictions Precautions Precautions: Fall      Mobility  Bed Mobility Overal bed mobility: Needs Assistance Bed Mobility: Supine to Sit;Sit to Supine     Supine to sit: Mod assist;HOB elevated Sit to supine: Min guard   General bed mobility comments: pt not following commands for coming to sit, required manual facilitation and then she  assisted; return to bed, near need for assist to raise legs onto bed; +2 total to scoot to middle and HOB  Transfers Overall transfer level: Needs assistance Equipment used: Rolling walker (2 wheeled) Transfers: Sit to/from [facility] Sit to Stand: Min assist;+2 physical assistance;+2 safety/equipment Stand pivot transfers: Min assist;+2 physical assistance;+2 safety/equipment       General transfer comment: each transfer required assist with hand placement with RW; with tactile cues pt engaged in activity with only min assist (some posterior lean upon standing);   Ambulation/Gait             General Gait Details: pivotal steps only with guidance/cues; RW use did not appear familiar  Stairs            Wheelchair Mobility    Modified Rankin (Stroke Patients Only)       Balance Overall balance assessment: Needs assistance Sitting-balance support: No upper extremity supported;Feet supported Sitting balance-Leahy Scale: Poor Sitting balance - Comments: posterior and left lean; can hold midline for <30 seconds with closeguarding, then loses balance   Standing balance support: Bilateral upper extremity supported;During functional activity Standing balance-Leahy Scale: Poor Standing balance comment: posterior lean initially                             Pertinent Vitals/Pain Pain Assessment: Faces Faces Pain Scale: No hurt    Home Living Family/patient expects to be discharged to:: Other (Comment)                 Additional  Comments: from memory care unit    Prior Function Level of Independence: Needs assistance   Gait / Transfers Assistance Needed: per chart was ambulating with RW (although RW did not seem familiar to her today)           Hand Dominance        Extremity/Trunk Assessment   Upper Extremity Assessment Upper Extremity Assessment: Defer to OT evaluation    Lower Extremity Assessment Lower Extremity  Assessment: Generalized weakness;Difficult to assess due to impaired cognition(appears symmetrical)    Cervical / Trunk Assessment Cervical / Trunk Assessment: Kyphotic  Communication   Communication: Expressive difficulties(non-sensical speech is baseline per chart)  Cognition Arousal/Alertness: Awake/alert Behavior During Therapy: WFL for tasks assessed/performed Overall Cognitive Status: No family/caregiver present to determine baseline cognitive functioning                                 General Comments: resides in memory care unit; admitted with encephalopathy vs progression of dementia      General Comments General comments (skin integrity, edema, etc.): HR max 120 with transfers/standing    Exercises     Assessment/Plan    PT Assessment Patient needs continued PT services  PT Problem List Decreased strength;Decreased activity tolerance;Decreased balance;Decreased mobility;Decreased cognition;Decreased knowledge of use of DME;Decreased safety awareness;Cardiopulmonary status limiting activity       PT Treatment Interventions DME instruction;Gait training;Functional mobility training;Therapeutic activities;Therapeutic exercise;Balance training;Neuromuscular re-education;Cognitive remediation;Patient/family education    PT Goals (Current goals can be found in the Care Plan section)  Acute Rehab PT Goals Patient Stated Goal: unable to express PT Goal Formulation: Patient unable to participate in goal setting Time For Goal Achievement: 12/10/19 Potential to Achieve Goals: Fair    Frequency Min 2X/week   Barriers to discharge        Co-evaluation PT/OT/SLP Co-Evaluation/Treatment: Yes Reason for Co-Treatment: Necessary to address cognition/behavior during functional activity;For patient/therapist safety;To address functional/ADL transfers PT goals addressed during session: Mobility/safety with mobility;Proper use of DME         AM-PAC PT "6 Clicks"  Mobility  Outcome Measure Help needed turning from your back to your side while in a flat bed without using bedrails?: A Little Help needed moving from lying on your back to sitting on the side of a flat bed without using bedrails?: A Little Help needed moving to and from a bed to a chair (including a wheelchair)?: A Little Help needed standing up from a chair using your arms (e.g., wheelchair or bedside chair)?: A Little Help needed to walk in hospital room?: A Lot Help needed climbing 3-5 steps with a railing? : Total 6 Click Score: 15    End of Session   Activity Tolerance: Patient limited by fatigue(HR elevated 120 bpm) Patient left: in bed;with call bell/phone within reach;with bed alarm set Nurse Communication: Mobility status PT Visit Diagnosis: Unsteadiness on feet (R26.81);Muscle weakness (generalized) (M62.81)    Time: 4967-5916 PT Time Calculation (min) (ACUTE ONLY): 22 min   Charges:   PT Evaluation $PT Eval Low Complexity: 1 Low           Jerolyn Center, PT Pager [phone]   Zena Amos 11/26/2019, 9:57 AM

## 2019-11-26 NOTE — TOC Progression Note (Signed)
Transition of Care [facility]) - Progression Note    Patient Details  Name: KEYANAH KOZICKI MRN: [id number] Date of Birth: [birth year]  Transition of Care [facility]) CM/SW Contact  Sharin Mons, RN Phone Number: 11/26/2019, 12:24 PM  Clinical Narrative:    Presented with increased confusion and left- sided weakness.Hx of dementia, anxiety, hypertension, polymyalgia rheumatica.     NCM received consult for possible SNF placement at time of discharge. NCM spoke with patient's son Sherren Mocha.Sherren Mocha states he would like for mom to discharge to SNF at time of discharge. PT/OT evaluation shared. Pt is from [facility], memory care unit. Todd reports preference for  [facility]. NCM discussed insurance authorization process and provided Medicare SNF ratings list. Sherren Mocha expressed being hopeful for rehab and mom to feel better soon. No further questions reported at this time. NCM to continue to follow and assist with discharge planning needs.        Expected Discharge Plan and Services                                                 Social Determinants of Health (SDOH) Interventions    Readmission Risk Interventions No flowsheet data found.

## 2019-11-27 DIAGNOSIS — Z515 Encounter for palliative care: Secondary | ICD-10-CM

## 2019-11-27 DIAGNOSIS — Z66 Do not resuscitate: Secondary | ICD-10-CM

## 2019-11-27 DIAGNOSIS — Z7189 Other specified counseling: Secondary | ICD-10-CM

## 2019-11-27 LAB — URINALYSIS, ROUTINE W REFLEX MICROSCOPIC
Bilirubin Urine: NEGATIVE
Glucose, UA: NEGATIVE mg/dL
Ketones, ur: NEGATIVE mg/dL
Nitrite: NEGATIVE
Protein, ur: 100 mg/dL — AB
Specific Gravity, Urine: 1.035 — ABNORMAL HIGH (ref 1.005–1.030)
pH: 5 (ref 5.0–8.0)

## 2019-11-27 LAB — CBC WITH DIFFERENTIAL/PLATELET
Abs Immature Granulocytes: 0.01 10*3/uL (ref 0.00–0.07)
Basophils Absolute: 0 10*3/uL (ref 0.0–0.1)
Basophils Relative: 0 %
Eosinophils Absolute: 0 10*3/uL (ref 0.0–0.5)
Eosinophils Relative: 0 %
HCT: 50.5 % — ABNORMAL HIGH (ref 36.0–46.0)
Hemoglobin: 16.8 g/dL — ABNORMAL HIGH (ref 12.0–15.0)
Immature Granulocytes: 0 %
Lymphocytes Relative: 21 %
Lymphs Abs: 0.9 10*3/uL (ref 0.7–4.0)
MCH: 30.1 pg (ref 26.0–34.0)
MCHC: 33.3 g/dL (ref 30.0–36.0)
MCV: 90.5 fL (ref 80.0–100.0)
Monocytes Absolute: 0.6 10*3/uL (ref 0.1–1.0)
Monocytes Relative: 14 %
Neutro Abs: 2.9 10*3/uL (ref 1.7–7.7)
Neutrophils Relative %: 65 %
Platelets: 155 10*3/uL (ref 150–400)
RBC: 5.58 MIL/uL — ABNORMAL HIGH (ref 3.87–5.11)
RDW: 13.4 % (ref 11.5–15.5)
WBC: 4.5 10*3/uL (ref 4.0–10.5)
nRBC: 0 % (ref 0.0–0.2)

## 2019-11-27 LAB — BASIC METABOLIC PANEL
Anion gap: 11 (ref 5–15)
BUN: 29 mg/dL — ABNORMAL HIGH (ref 8–23)
CO2: 25 mmol/L (ref 22–32)
Calcium: 8.9 mg/dL (ref 8.9–10.3)
Chloride: 102 mmol/L (ref 98–111)
Creatinine, Ser: 0.87 mg/dL (ref 0.44–1.00)
GFR calc Af Amer: >60 mL/min (ref >60)
GFR calc non Af Amer: >60 mL/min (ref >60)
Glucose, Bld: 126 mg/dL — ABNORMAL HIGH (ref 70–99)
Potassium: 3.5 mmol/L (ref 3.5–5.1)
Sodium: 138 mmol/L (ref 135–145)

## 2019-11-27 LAB — MAGNESIUM: Magnesium: 2.2 mg/dL (ref 1.7–2.4)

## 2019-11-27 LAB — SEDIMENTATION RATE: Sed Rate: 6 mm/hr (ref 0–22)

## 2019-11-27 LAB — SARS CORONAVIRUS 2 (TAT 6-24 HRS): SARS Coronavirus 2: NEGATIVE

## 2019-11-27 LAB — C-REACTIVE PROTEIN: CRP: 3.5 mg/dL — ABNORMAL HIGH (ref <1.0)

## 2019-11-27 MED ORDER — SODIUM CHLORIDE 0.9 % IV SOLN: INTRAVENOUS | Status: AC

## 2019-11-27 NOTE — TOC Progression Note (Addendum)
Transition of Care [facility]) - Progression Note    Patient Details  Name: Shirley [name] MRN: [id number] Date of Birth: 1934/05/01  Transition of Care [facility]) CM/SW [place], LCSW Phone Number: 11/27/2019, 10:23 AM  Clinical Narrative:    [facility] able to accept patient today pending insurance approval. CSW sent clinicals to insurance for review. Patient' son to complete paperwork at [facility] at 1:30pm with hopes patient will be approved. Per [facility], patient's last COVID on 12/19 is acceptable as long as patient does not have symptoms.    UPDATE: Insurance authorization received: [id number], for 6 days. Per MD, will monitor patient overnight for fevers. If discharging tomorrow, facility will need DC Summary by 12pm.         Expected Discharge Plan and Services                                                 Social Determinants of Health (SDOH) Interventions    Readmission Risk Interventions No flowsheet data found.

## 2019-11-27 NOTE — Progress Notes (Signed)
  Speech Language Pathology Treatment: Dysphagia  Patient Details Name: Shirley [name] MRN: [id number] DOB: [birth year] Today's Date: 11/27/2019 Time: 1217-1227 SLP Time Calculation (min) (ACUTE ONLY): 10 min  Assessment / Plan / Recommendation Clinical Impression  Shirley [name], alert and participatory communication mostly nonsensical. SLP provided regular texture to determine safety for upgrade. She demonstrated ineffective and incomplete mastication of graham cracker creating increases aspiration risk. Tactile assist needed to control sip size after large sip with straw elicited coughing. No other s/s aspiration with smaller, controlled sips. A Dys 2 texture is appropriate for pt presently and continue with thin liquids. ST will sign off and recommend reassess at her memory care for texture upgrade.     HPI HPI: 85 y.o. female with medical history significant for dementia, anxiety, hypertension, and polymyalgia rheumatica, now presenting to the emergency department from memory unit at her nursing facility for evaluation of increased confusion and left-sided weakness. CT head and MRI brain negative for acute stroke; acute encephalopathy      SLP Plan  All goals met;Discharge SLP treatment due to (comment)       Recommendations  Diet recommendations: Dysphagia 2 (fine chop);Thin liquid Liquids provided via: Cup;Straw Medication Administration: Crushed with puree Supervision: Full supervision/cueing for compensatory strategies;Patient able to self feed;Staff to assist with self feeding Compensations: Slow rate;Small sips/bites;Lingual sweep for clearance of pocketing Postural Changes and/or Swallow Maneuvers: Seated upright 90 degrees                Oral Care Recommendations: Oral care BID Follow up Recommendations: 24 hour supervision/assistance;Skilled Nursing facility SLP Visit Diagnosis: Dysphagia, unspecified (R13.10) Plan: All goals met;Discharge SLP treatment due to  (comment)       GO                Shirley [name] 11/27/2019, 12:36 PM

## 2019-11-27 NOTE — Progress Notes (Addendum)
[facility] PROGRESS NOTE  Shirley BealBarbara B [name] ZOX:[id number] DOB: 1934-10-31 DOA: 11/23/2019 PCP: Roderick Peeodd, Jeffrey A, MD (Inactive)  Brief summary   85 year old female with history of dementia, anxiety, hypertension, polymyalgia rheumatica presented from her memory care unit for evaluation of increased confusion and left-sided weakness.  On presentation, CT head was negative for intracranial abnormality.  CTA head and neck negative for emergent LVO and no evidence of significant ischemia on CT perfusion study.  Neurology was consulted.  MRI was negative for acute stroke.  Neurology signed off.  Assessment/Plan:  Acute metabolic encephalopathy with question of left-sided deficits -Presented from memory care unit with increased confusion and possible left-sided weakness, noted to have subtle left lower facial weakness in ED -CT head was negative for intracranial abnormality.  CTA head and neck negative for emergent LVO and no evidence of significant ischemia on CT perfusion study.  -Neurology was consulted.  MRI was negative for acute stroke.  Neurology signed off. -TSH normal; ammonia normal.  Low B12 levels. -Wonder if this is progression of her severe dementia versus vitamin B12 deficiency  Dementia with behavioral changes/delirium? -Fall precautions.  Monitor mental status.  Continue delirium precautions.  Hypertension -  Monitor blood pressure.  Switch to oral metoprolol.  Anxiety  -continue Celexa  Ascending aortic aneurysm -Incidental finding of 4.3 cm aneurysm.  Annual follow-up imaging recommended  Vitamin B12 deficiency -Vitamin B12 level 171.  Continue IM vitamin B12 supplementation while in the hospital and switch to oral on discharge  Fever. ? Source. Intermittent since 11/24/2019.  Cultures have been negative so far. covid neg. No source of infection found except for gingivitis.  started on Augmentin -? Due to acute on chronic PMR. Will check sed rate/crp. If  elevated, needs prednisone. Also recheck UA. Will recheck covid/needs for placement testing as well   Gingivitis/dental infection: -She has several teeth which seem to be infected and has obvious gingivitis.  Continue Augmentin.  Outpatient follow-up with dentist.  Dehydration. Poor oral intake. Will start iv fluids for 24 hrs. Encouraged PO intake   Generalized conditioning -Overall prognosis is guarded to poor.  Currently listed as full code.  Will request medical care evaluation for goals of care discussions. -Patient presented from a memory care unit.  Apparently, family is interested in a different memory care unit.  Social worker following.  Code Status: full Family Communication: d/w patient, RN, SW (indicate person spoken with, relationship, and if by phone, the number) Disposition Plan: monitoring due to fever. needs SNF at discharge.    Consultants:  Neurology   Procedures:  none  Antibiotics: Anti-infectives (From admission, onward)   Start     Dose/Rate Route Frequency Ordered Stop   11/25/19 1345  amoxicillin-clavulanate (AUGMENTIN) 875-125 MG per tablet 1 tablet     1 tablet Oral Every 12 hours 11/25/19 1334 12/05/19 0959        (indicate start date, and stop date if known)  HPI/Subjective: No distress. Denies acute SOB, no cough. Mild fever again yesterday evening. No GI symptoms   Objective: Vitals:   11/26/19 2349 11/27/19 0923  BP: 106/69 (!) 153/89  Pulse: 96 97  Resp:  16  Temp: 98.4 F (36.9 C) 98.7 F (37.1 C)  SpO2: 94% 91%    Intake/Output Summary (Last 24 hours) at 11/27/2019 1153 Last data filed at 11/27/2019 1100 Gross per 24 hour  Intake 243 ml  Output --  Net 243 ml   Filed Weights   11/23/19 1800 11/24/19 0051  11/25/19 0437  Weight: 62.4 kg 61.2 kg 59.5 kg    Exam:   General:  No distress   Cardiovascular: s1,s2 rrr  Respiratory: CTA BL  Abdomen: soft, nt   Musculoskeletal: no leg edema    Data  Reviewed: Basic Metabolic Panel: Recent Labs  Lab 11/23/19 1849 11/23/19 1850 11/24/19 0438 11/25/19 0313 11/27/19 0235  NA 136 137 136 137 138  K 3.9 3.9 4.3 3.7 3.5  CL 100 99 102 102 102  CO2 24  --  21* 22 25  GLUCOSE 154* 154* 105* 127* 126*  BUN 19 23 20 22  29*  CREATININE 0.93 0.80 0.81 0.89 0.87  CALCIUM 9.1  --  8.7* 8.7* 8.9  MG  --   --   --   --  2.2   Liver Function Tests: Recent Labs  Lab 11/23/19 1849  AST 29  ALT 18  ALKPHOS 79  BILITOT 0.6  PROT 7.4  ALBUMIN 3.9   No results for input(s): LIPASE, AMYLASE in the last 168 hours. Recent Labs  Lab 11/24/19 0001  AMMONIA 16   CBC: Recent Labs  Lab 11/23/19 1849 11/23/19 1850 11/24/19 0438 11/25/19 0313 11/27/19 0235  WBC 6.6  --  6.9 5.4 4.5  NEUTROABS 4.7  --   --  3.2 2.9  HGB 16.8* 18.0* 15.7* 16.2* 16.8*  HCT 51.3* 53.0* 47.7* 48.2* 50.5*  MCV 92.9  --  92.6 89.8 90.5  PLT 192  --  181 192 155   Cardiac Enzymes: No results for input(s): CKTOTAL, CKMB, CKMBINDEX, TROPONINI in the last 168 hours. BNP (last 3 results) No results for input(s): BNP in the last 8760 hours.  ProBNP (last 3 results) No results for input(s): PROBNP in the last 8760 hours.  CBG: Recent Labs  Lab 11/23/19 1845 11/24/19 0843 11/24/19 1604 11/25/19 0804 11/26/19 0817  GLUCAP 157* 98 101* 116* 120*    Recent Results (from the past 240 hour(s))  SARS CORONAVIRUS 2 (TAT 6-24 HRS) Nasopharyngeal Nasopharyngeal Swab     Status: None   Collection Time: 11/23/19  9:38 PM   Specimen: Nasopharyngeal Swab  Result Value Ref Range Status   SARS Coronavirus 2 NEGATIVE NEGATIVE Final    Comment: (NOTE) SARS-CoV-2 target nucleic acids are NOT DETECTED. The SARS-CoV-2 RNA is generally detectable in upper and lower respiratory specimens during the acute phase of infection. Negative results do not preclude SARS-CoV-2 infection, do not rule out co-infections with other pathogens, and should not be used as the sole  basis for treatment or other patient management decisions. Negative results must be combined with clinical observations, patient history, and epidemiological information. The expected result is Negative. Fact Sheet for Patients: SugarRoll.be Fact Sheet for Healthcare Providers: https://www.woods-mathews.com/ This test is not yet approved or cleared by the [place] FDA and  has been authorized for detection and/or diagnosis of SARS-CoV-2 by FDA under an Emergency Use Authorization (EUA). This EUA will remain  in effect (meaning this test can be used) for the duration of the COVID-19 declaration under Section 56 4(b)(1) of the Act, 21 U.S.C. section 360bbb-3(b)(1), unless the authorization is terminated or revoked sooner. Performed at [facility], [place] [address], [place], [place] [address]   Culture, blood (routine x 2)     Status: None (Preliminary result)   Collection Time: 11/24/19  4:05 PM   Specimen: BLOOD LEFT HAND  Result Value Ref Range Status   Specimen Description BLOOD LEFT HAND  Final  Special Requests   Final    BOTTLES DRAWN AEROBIC AND ANAEROBIC Blood Culture adequate volume   Culture   Final    NO GROWTH 3 DAYS Performed at [facility], [address], [place], [place] [address]    Report Status PENDING  Incomplete  Culture, blood (routine x 2)     Status: None (Preliminary result)   Collection Time: 11/24/19  4:15 PM   Specimen: BLOOD RIGHT HAND  Result Value Ref Range Status   Specimen Description BLOOD RIGHT HAND  Final   Special Requests   Final    BOTTLES DRAWN AEROBIC ONLY Blood Culture results may not be optimal due to an inadequate volume of blood received in culture bottles   Culture   Final    NO GROWTH 3 DAYS Performed at [facility], [address], [place], [place] [address]    Report Status PENDING  Incomplete     Studies: No results found.  Scheduled Meds: .  amoxicillin-clavulanate  1 tablet Oral Q12H  . citalopram  20 mg Oral Daily  . cyanocobalamin  1,000 mcg Intramuscular Q1400  . donepezil  10 mg Oral QHS  . enoxaparin (LOVENOX) injection  40 mg Subcutaneous Q24H  . metoprolol succinate  25 mg Oral QHS  . sodium chloride flush  3 mL Intravenous Once  . sodium chloride flush  3 mL Intravenous Q12H  . sodium chloride flush  3 mL Intravenous Q12H   Continuous Infusions: . sodium chloride      Principal Problem:   Acute encephalopathy Active Problems:   Essential hypertension   Moderate dementia (HCC)   Ascending aortic aneurysm (HCC)    Time spent: >25 minutes     Esperanza Sheets  [facility] Pager [phone]. If 7PM-7AM, please contact night-coverage at [email or web address], password [facility] 11/27/2019, 11:53 AM  LOS: 3 days

## 2019-11-28 LAB — GLUCOSE, CAPILLARY
Glucose-Capillary: 110 mg/dL — ABNORMAL HIGH (ref 70–99)
Glucose-Capillary: 109 mg/dL — ABNORMAL HIGH (ref 70–99)
Glucose-Capillary: 96 mg/dL (ref 70–99)

## 2019-11-28 MED ORDER — AMOXICILLIN-POT CLAVULANATE 875-125 MG PO TABS: 1.0000 | ORAL_TABLET | Freq: Two times a day (BID) | ORAL | 0 refills | Status: DC

## 2019-11-28 MED ORDER — VITAMIN B-12 1000 MCG PO TABS: 2000.0000 ug | ORAL_TABLET | Freq: Every day | ORAL | 1 refills | Status: AC

## 2019-11-28 NOTE — TOC Transition Note (Signed)
Transition of Care [facility]) - CM/SW Discharge Note   Patient Details  Name: Shirley [name] MRN: [id number] Date of Birth: [birth year]  Transition [facility]) CM/SW Contact:  Benard Halsted, LCSW Phone Number: 11/28/2019, 10:21 AM   Clinical Narrative:    Patient will DC to: [facility] Anticipated DC date: 11/28/19 Family notified: Son, Sherren Mocha Transport by: Corey Harold 11:30am   Per MD patient ready for DC to [facility]. RN, patient, patient's family, and facility notified of DC. Discharge Summary and FL2 sent to facility. RN to call report prior to discharge [phone] Room 3214). DC packet on chart. Ambulance transport requested for patient.   CSW will sign off for now as social work intervention is no longer needed. Please consult [place] again if new needs arise.  Cedric Fishman, LCSW Clinical Social Worker [phone]    Final next level of care: Skilled Nursing Facility Barriers to Discharge: No Barriers Identified   Patient Goals and CMS Choice Patient states their goals for this hospitalization and ongoing recovery are:: Rehab CMS Medicare.gov Compare Post Acute Care list provided to:: Patient Represenative (must comment)(Son) Choice offered to / list presented to : Adult Children  Discharge Placement                       Discharge Plan and Services In-house Referral: Clinical Social Work   Post Acute Care Choice: [place]                               Social Determinants of Health (SDOH) Interventions     Readmission Risk Interventions No flowsheet data found.

## 2019-11-28 NOTE — Discharge Summary (Addendum)
Physician Discharge Summary  Shirley [name]:[id number] DOB: 1934/03/17 DOA: 11/23/2019  PCP: Roderick Pee, MD (Inactive)  Admit date: 11/23/2019 Discharge date: 11/28/2019  Time spent: 45 minutes  Recommendations for Outpatient Follow-up:  Follow up PCP 1-2 weeks for evaluation of symptoms. Recommend B12 level  [facility]   Discharge Diagnoses:  Principal Problem:   Acute encephalopathy Active Problems:   Essential hypertension   Moderate dementia (HCC)   Ascending aortic aneurysm (HCC)   Palliative care by specialist   Goals of care, counseling/discussion   DNR (do not resuscitate)   Discharge Condition: stable  Diet recommendation: dysphagia 2 thin liquids  Filed Weights   11/23/19 1800 11/24/19 0051 11/25/19 0437  Weight: 62.4 kg 61.2 kg 59.5 kg    History of present illness:  Shirley [name] is a 85 y.o. female with medical history significant for dementia, anxiety, hypertension, and polymyalgia rheumatica, presented 12/19 to the emergency department from her nursing facility for evaluation of increased confusion and left-sided weakness.  History somewhat unclear but these changes were apparently noticed at around 6 PM with last seen in usual state possibly within an hour prior to that.  At her baseline, the patient reportedly oriented to self only, ambulates with a walker, and does not have dysarthria though her speech is usually nonsensical.  Patient  unable to contribute much to the history due to her clinical condition.   Hospital Course:   Acute metabolic encephalopathy with question of left-sided deficits.Presented from memory care unit with increased confusion and possible left-sided weakness, noted to have subtle left lower facial weakness in ED. CT head was negative for intracranial abnormality.  CTA head and neck negative for emergent LVO and no evidence of significant ischemia on CT perfusion study. MRI was negative for acute stroke.TSH normal; ammonia  normal.  Low B12 levels. Query progression of  severe dementia versus vitamin B12 deficiency. Evaluated by neuro who opined no evidence of acute stroke. B12 provided. Patient will be discharged with B12 supplements and recommendation to recheck B12 in 1 month.   Dementia with behavioral changes/delirium? -Fall precautions. delirium precautions. See #1   Hypertension. Controlled. oral metoprolol.   Anxiety continue Celexa   Ascending aortic aneurysm Incidental finding of 4.3 cm aneurysm.  Annual follow-up imaging recommended   Vitamin B12 deficiency Vitamin B12 level 171.  Provided IM vitamin B12 supplementation while in the hospital and oral supplements at discharge.    Fever. ? Source. Intermittent since 11/24/2019.  Cultures have been negative. covid neg. No source of infection found except for gingivitis.  started on Augmentin and will continue this through 12/31. ? Due to acute on chronic PMR. Sed rate within limits of normal.     Gingivitis/dental infection:  -She has several teeth which seem to be infected and has obvious gingivitis.  provided Augmentin. As noted above  Outpatient follow-up with dentist.   Dehydration. Poor oral intake. Encouraged PO intake    Generalized conditioning -Overall prognosis is guarded to poor.  Currently listed as full code.  Will request medical care evaluation for goals of care discussions. -Patient presented from a memory care unit. Being discharged to [facility].   Procedures:   Consultations:   Discharge Exam: Vitals:   11/27/19 2135 11/28/19 0840  BP: (!) 167/92 (!) 194/101  Pulse: 92 83  Resp: 20 16  Temp: 98.1 F (36.7 C) 99 F (37.2 C)  SpO2: 93% 94%    General: awake alert no acute distress Cardiovascular: rrr no mgr  no LE edema Respiratory: no increased work of breathing. BS clear bilaterally  Discharge Instructions   Discharge Instructions     Call MD for:  difficulty breathing, headache or visual disturbances    Complete by: As directed    Call MD for:  temperature >100.4   Complete by: As directed    Diet - low sodium heart healthy   Complete by: As directed    Discharge instructions   Complete by: As directed    B12 level in one month Follow up with PCP 2-3 weeks for evaluation of symptoms   Increase activity slowly   Complete by: As directed       Allergies as of 11/28/2019   No Known Allergies      Medication List     TAKE these medications    amoxicillin-clavulanate 875-125 MG tablet Commonly known as: AUGMENTIN Take 1 tablet by mouth every 12 (twelve) hours for 7 days.   citalopram 10 MG tablet Commonly known as: CELEXA TAKE 2 TABLETS EVERY DAY What changed:  when to take this additional instructions   donepezil 10 MG tablet Commonly known as: ARICEPT TAKE 1 TABLET EVERY DAY What changed:  how much to take how to take this when to take this additional instructions   metoprolol succinate 25 MG 24 hr tablet Commonly known as: TOPROL-XL Take 25 mg by mouth at bedtime.   triamcinolone cream 0.1 % Commonly known as: KENALOG Compound 1;1 with Eucerin cream and apply bid prn rash. What changed:  how much to take how to take this when to take this additional instructions   vitamin B-12 1000 MCG tablet Commonly known as: CYANOCOBALAMIN Take 2 tablets (2,000 mcg total) by mouth daily.       No Known Allergies     The results of significant diagnostics from this hospitalization (including imaging, microbiology, ancillary and laboratory) are listed below for reference.    Significant Diagnostic Studies: CT Code Stroke CTA Head W/WO contrast  Result Date: 11/23/2019 CLINICAL DATA:  Left-sided weakness. EXAM: CT ANGIOGRAPHY HEAD AND NECK CT PERFUSION BRAIN TECHNIQUE: Multidetector CT imaging of the head and neck was performed using the standard protocol during bolus administration of intravenous contrast. Multiplanar CT image reconstructions and MIPs were  obtained to evaluate the vascular anatomy. Carotid stenosis measurements (when applicable) are obtained utilizing NASCET criteria, using the distal internal carotid diameter as the denominator. Multiphase CT imaging of the brain was performed following IV bolus contrast injection. Subsequent parametric perfusion maps were calculated using RAPID software. CONTRAST:  OMNIPAQUE IOHEXOL 350 MG/ML SOLN COMPARISON:  None. FINDINGS: CTA NECK FINDINGS Aortic arch: Standard 3 vessel aortic arch with atherosclerotic plaque but no significant arch vessel origin stenosis. Small to moderate volume soft plaque partially visualized in the descending thoracic aorta. Aneurysmal dilatation of the ascending aorta with maximal diameter of 4.3 cm, new from a 2016 chest CT. Right carotid system: Patent with minimal calcified plaque at the carotid bifurcation. No evidence of stenosis or dissection. Left carotid system: Patent with heavily calcified plaque at the carotid bifurcation resulting in less than 50% stenosis of the distal common carotid artery and ICA origin. Vertebral arteries: Patent with the right being mildly dominant. No evidence of significant stenosis or dissection. Skeleton: Mild-to-moderate disc and moderate facet degeneration in the cervical spine. Mild superior endplate compression fractures/Schmorl's node deformities at T2, T3, T4, and T5, all chronic in appearance. Other neck: No evidence of cervical lymphadenopathy or mass. Upper chest: No apical lung  consolidation or mass. Review of the MIP images confirms the above findings CTA HEAD FINDINGS Anterior circulation: The internal carotid arteries are patent from skull base to carotid termini with mild nonstenotic calcified plaque bilaterally. ACAs and MCAs are patent without evidence of proximal branch occlusion or significant A1 or M1 stenosis, however there is prominent branch vessel atherosclerosis including severe stenoses of the M3 and more distal MCA  branch vessels as well as distal A2 and more distal ACAs. No aneurysm is identified. Posterior circulation: The intracranial vertebral arteries are widely patent to the basilar. Patent PICA and SCA origins are visualized bilaterally. The basilar artery is widely patent. There are small right and large left posterior communicating arteries with a fetal origin of the left PCA. There are moderate stenoses of the left posterior communicating artery and both P2 segments, and they were severe bilateral P3 stenoses. No aneurysm is identified. Venous sinuses: Poorly evaluated due to contrast timing though grossly patent. Anatomic variants: Low fetal left PCA. Review of the MIP images confirms the above findings CT Brain Perfusion Findings: ASPECTS: 10 CBF (<30%) Volume: 67mL Perfusion (Tmax>6.0s) volume: 47mL Mismatch Volume: n/a Infarction Location: No infarct evident on CTP. IMPRESSION: 1. No emergent large vessel occlusion, and no evidence of significant ischemia on CTP. 2. Advanced intracranial atherosclerosis primarily involving the distal branch vessels. No flow limiting proximal intracranial stenosis. 3. Left greater than right cervical carotid artery atherosclerosis without significant stenosis. 4. Widely patent vertebral arteries. 5. 4.3 cm ascending aortic aneurysm. Recommend annual imaging followup by CTA or MRA. This recommendation follows 2010 ACCF/AHA/AATS/ACR/ASA/SCA/SCAI/SIR/STS/SVM Guidelines for the Diagnosis and Management of Patients with Thoracic Aortic Disease. Circulation. 2010; 121: A213-Y865. Aortic aneurysm NOS (ICD10-I71.9) 6. Aortic Atherosclerosis (ICD10-I70.0). Preliminary findings of no emergent LVO and negative CTP were communicated to Dr. Wilford Corner at 7:26 pm on 11/23/2019 by text page via the [facility] messaging system. Electronically Signed   By: Sebastian Ache M.D.   On: 11/23/2019 19:50   CT Code Stroke CTA Neck W/WO contrast  Result Date: 11/23/2019 CLINICAL DATA:  Left-sided weakness. EXAM:  CT ANGIOGRAPHY HEAD AND NECK CT PERFUSION BRAIN TECHNIQUE: Multidetector CT imaging of the head and neck was performed using the standard protocol during bolus administration of intravenous contrast. Multiplanar CT image reconstructions and MIPs were obtained to evaluate the vascular anatomy. Carotid stenosis measurements (when applicable) are obtained utilizing NASCET criteria, using the distal internal carotid diameter as the denominator. Multiphase CT imaging of the brain was performed following IV bolus contrast injection. Subsequent parametric perfusion maps were calculated using RAPID software. CONTRAST:  OMNIPAQUE IOHEXOL 350 MG/ML SOLN COMPARISON:  None. FINDINGS: CTA NECK FINDINGS Aortic arch: Standard 3 vessel aortic arch with atherosclerotic plaque but no significant arch vessel origin stenosis. Small to moderate volume soft plaque partially visualized in the descending thoracic aorta. Aneurysmal dilatation of the ascending aorta with maximal diameter of 4.3 cm, new from a 2016 chest CT. Right carotid system: Patent with minimal calcified plaque at the carotid bifurcation. No evidence of stenosis or dissection. Left carotid system: Patent with heavily calcified plaque at the carotid bifurcation resulting in less than 50% stenosis of the distal common carotid artery and ICA origin. Vertebral arteries: Patent with the right being mildly dominant. No evidence of significant stenosis or dissection. Skeleton: Mild-to-moderate disc and moderate facet degeneration in the cervical spine. Mild superior endplate compression fractures/Schmorl's node deformities at T2, T3, T4, and T5, all chronic in appearance. Other neck: No evidence of cervical lymphadenopathy or mass.  Upper chest: No apical lung consolidation or mass. Review of the MIP images confirms the above findings CTA HEAD FINDINGS Anterior circulation: The internal carotid arteries are patent from skull base to carotid termini with mild nonstenotic  calcified plaque bilaterally. ACAs and MCAs are patent without evidence of proximal branch occlusion or significant A1 or M1 stenosis, however there is prominent branch vessel atherosclerosis including severe stenoses of the M3 and more distal MCA branch vessels as well as distal A2 and more distal ACAs. No aneurysm is identified. Posterior circulation: The intracranial vertebral arteries are widely patent to the basilar. Patent PICA and SCA origins are visualized bilaterally. The basilar artery is widely patent. There are small right and large left posterior communicating arteries with a fetal origin of the left PCA. There are moderate stenoses of the left posterior communicating artery and both P2 segments, and they were severe bilateral P3 stenoses. No aneurysm is identified. Venous sinuses: Poorly evaluated due to contrast timing though grossly patent. Anatomic variants: Low fetal left PCA. Review of the MIP images confirms the above findings CT Brain Perfusion Findings: ASPECTS: 10 CBF (<30%) Volume: 0mL Perfusion (Tmax>6.0s) volume: 0mL Mismatch Volume: n/a Infarction Location: No infarct evident on CTP. IMPRESSION: 1. No emergent large vessel occlusion, and no evidence of significant ischemia on CTP. 2. Advanced intracranial atherosclerosis primarily involving the distal branch vessels. No flow limiting proximal intracranial stenosis. 3. Left greater than right cervical carotid artery atherosclerosis without significant stenosis. 4. Widely patent vertebral arteries. 5. 4.3 cm ascending aortic aneurysm. Recommend annual imaging followup by CTA or MRA. This recommendation follows 2010 ACCF/AHA/AATS/ACR/ASA/SCA/SCAI/SIR/STS/SVM Guidelines for the Diagnosis and Management of Patients with Thoracic Aortic Disease. Circulation. 2010; 121: Z610-R604: E266-e369. Aortic aneurysm NOS (ICD10-I71.9) 6. Aortic Atherosclerosis (ICD10-I70.0). Preliminary findings of no emergent LVO and negative CTP were communicated to Dr. Wilford CornerArora at  7:26 pm on 11/23/2019 by text page via the [facility] messaging system. Electronically Signed   By: Sebastian AcheAllen  Grady M.D.   On: 11/23/2019 19:50   MR BRAIN WO CONTRAST  Result Date: 11/24/2019 CLINICAL DATA:  Encephalopathy. Increased confusion and left-sided weakness. History of dementia. EXAM: MRI HEAD WITHOUT CONTRAST TECHNIQUE: Multiplanar, multiecho pulse sequences of the brain and surrounding structures were obtained without intravenous contrast. COMPARISON:  Head CT, CTA, and CTP 11/23/2019. Head MRI 01/30/2013. FINDINGS: Brain: No acute infarct, mass, midline shift, or extra-axial fluid collection is identified. There is a single chronic microhemorrhage in the parasagittal right frontoparietal region/cingulate gyrus. Patchy to confluent T2 hyperintensities primarily involve the periventricular and deep cerebral white matter bilaterally and have progressed from 2014, nonspecific but compatible with moderate chronic small vessel ischemic disease. Ventriculomegaly has progressed from 2014 and is favored to reflect advanced central predominant cerebral atrophy rather than hydrocephalus, with severe bilateral mesial temporal lobe atrophy noted. Vascular: Major intracranial vascular flow voids are preserved. Skull and upper cervical spine: Unremarkable bone marrow signal. Sinuses/Orbits: Bilateral cataract extraction. Left greater than right maxillary sinus mucosal thickening with moderate volume fluid on the left. Mild bilateral ethmoid air cell mucosal thickening. Clear mastoid air cells. Other: None. IMPRESSION: 1. No acute intracranial abnormality. 2. Moderate chronic small vessel ischemic disease, progressed from 2014. 3. Advanced cerebral atrophy with severe bilateral mesial temporal lobe atrophy, progressed from 2014. Electronically Signed   By: Sebastian AcheAllen  Grady M.D.   On: 11/24/2019 14:55   CT Code Stroke Cerebral Perfusion with contrast  Result Date: 11/23/2019 CLINICAL DATA:  Left-sided weakness. EXAM: CT  ANGIOGRAPHY HEAD AND NECK CT PERFUSION BRAIN  TECHNIQUE: Multidetector CT imaging of the head and neck was performed using the standard protocol during bolus administration of intravenous contrast. Multiplanar CT image reconstructions and MIPs were obtained to evaluate the vascular anatomy. Carotid stenosis measurements (when applicable) are obtained utilizing NASCET criteria, using the distal internal carotid diameter as the denominator. Multiphase CT imaging of the brain was performed following IV bolus contrast injection. Subsequent parametric perfusion maps were calculated using RAPID software. CONTRAST:  OMNIPAQUE IOHEXOL 350 MG/ML SOLN COMPARISON:  None. FINDINGS: CTA NECK FINDINGS Aortic arch: Standard 3 vessel aortic arch with atherosclerotic plaque but no significant arch vessel origin stenosis. Small to moderate volume soft plaque partially visualized in the descending thoracic aorta. Aneurysmal dilatation of the ascending aorta with maximal diameter of 4.3 cm, new from a 2016 chest CT. Right carotid system: Patent with minimal calcified plaque at the carotid bifurcation. No evidence of stenosis or dissection. Left carotid system: Patent with heavily calcified plaque at the carotid bifurcation resulting in less than 50% stenosis of the distal common carotid artery and ICA origin. Vertebral arteries: Patent with the right being mildly dominant. No evidence of significant stenosis or dissection. Skeleton: Mild-to-moderate disc and moderate facet degeneration in the cervical spine. Mild superior endplate compression fractures/Schmorl's node deformities at T2, T3, T4, and T5, all chronic in appearance. Other neck: No evidence of cervical lymphadenopathy or mass. Upper chest: No apical lung consolidation or mass. Review of the MIP images confirms the above findings CTA HEAD FINDINGS Anterior circulation: The internal carotid arteries are patent from skull base to carotid termini with mild nonstenotic  calcified plaque bilaterally. ACAs and MCAs are patent without evidence of proximal branch occlusion or significant A1 or M1 stenosis, however there is prominent branch vessel atherosclerosis including severe stenoses of the M3 and more distal MCA branch vessels as well as distal A2 and more distal ACAs. No aneurysm is identified. Posterior circulation: The intracranial vertebral arteries are widely patent to the basilar. Patent PICA and SCA origins are visualized bilaterally. The basilar artery is widely patent. There are small right and large left posterior communicating arteries with a fetal origin of the left PCA. There are moderate stenoses of the left posterior communicating artery and both P2 segments, and they were severe bilateral P3 stenoses. No aneurysm is identified. Venous sinuses: Poorly evaluated due to contrast timing though grossly patent. Anatomic variants: Low fetal left PCA. Review of the MIP images confirms the above findings CT Brain Perfusion Findings: ASPECTS: 10 CBF (<30%) Volume: 0mL Perfusion (Tmax>6.0s) volume: 0mL Mismatch Volume: n/a Infarction Location: No infarct evident on CTP. IMPRESSION: 1. No emergent large vessel occlusion, and no evidence of significant ischemia on CTP. 2. Advanced intracranial atherosclerosis primarily involving the distal branch vessels. No flow limiting proximal intracranial stenosis. 3. Left greater than right cervical carotid artery atherosclerosis without significant stenosis. 4. Widely patent vertebral arteries. 5. 4.3 cm ascending aortic aneurysm. Recommend annual imaging followup by CTA or MRA. This recommendation follows 2010 ACCF/AHA/AATS/ACR/ASA/SCA/SCAI/SIR/STS/SVM Guidelines for the Diagnosis and Management of Patients with Thoracic Aortic Disease. Circulation. 2010; 121: W098-J191. Aortic aneurysm NOS (ICD10-I71.9) 6. Aortic Atherosclerosis (ICD10-I70.0). Preliminary findings of no emergent LVO and negative CTP were communicated to Dr. Wilford Corner at  7:26 pm on 11/23/2019 by text page via the [facility] messaging system. Electronically Signed   By: Sebastian Ache M.D.   On: 11/23/2019 19:50   DG Chest Portable 1 View  Result Date: 11/23/2019 CLINICAL DATA:  Acute shortness of breath EXAM: PORTABLE CHEST 1  VIEW COMPARISON:  07/06/2015 chest radiograph FINDINGS: UPPER limits normal heart size again noted. LEFT retrocardiac opacity appears similar to remote radiograph, but may represent atelectasis, scarring or airspace disease/consolidation. No definite pleural effusion or pneumothorax. No acute bony abnormalities. RIGHT shoulder arthroplasty changes noted. IMPRESSION: LEFT retrocardiac opacity which appears somewhat remote radiograph, but may represent atelectasis, scarring or airspace disease/consolidation. Electronically Signed   By: Margarette Canada M.D.   On: 11/23/2019 20:13   CT HEAD CODE STROKE WO CONTRAST  Result Date: 11/23/2019 CLINICAL DATA:  Code stroke. Left-sided weakness and slurred speech. EXAM: CT HEAD WITHOUT CONTRAST TECHNIQUE: Contiguous axial images were obtained from the base of the skull through the vertex without intravenous contrast. COMPARISON:  12/18/2018 FINDINGS: Brain: There is no evidence of acute infarct, intracranial hemorrhage, mass, midline shift, or extra-axial fluid collection. Patchy to confluent hypodensities in the cerebral white matter bilaterally are unchanged and nonspecific but compatible with moderate chronic small vessel ischemic disease. Mild-to-moderate lateral and third ventriculomegaly is unchanged and may reflect central predominant cerebral atrophy although normal pressure hydrocephalus is not excluded in the appropriate clinical setting. Vascular: Calcified atherosclerosis at the skull base. No hyperdense vessel. Skull: No fracture or focal osseous lesion. Sinuses/Orbits: Left greater than right maxillary sinus mucosal thickening with moderate volume fluid on the left. Mild bilateral ethmoid air cell mucosal  thickening. Clear mastoid air cells bilateral cataract extraction. Other: None. ASPECTS [facility] Stroke Program Early CT Score) - Ganglionic level infarction (caudate, lentiform nuclei, internal capsule, insula, M1-M3 cortex): 7 - Supraganglionic infarction (M4-M6 cortex): 3 Total score (0-10 with 10 being normal): 10 IMPRESSION: 1. No evidence of acute intracranial abnormality. 2. ASPECTS is 10. 3. Moderate chronic small vessel ischemic disease and unchanged ventriculomegaly. These results were communicated to Dr. Lorraine Lax at 7:02 pm on 11/23/2019 by text page via the [facility] messaging system. Electronically Signed   By: Logan Bores M.D.   On: 11/23/2019 19:02    Microbiology: Recent Results (from the past 240 hour(s))  SARS CORONAVIRUS 2 (TAT 6-24 HRS) Nasopharyngeal Nasopharyngeal Swab     Status: None   Collection Time: 11/23/19  9:38 PM   Specimen: Nasopharyngeal Swab  Result Value Ref Range Status   SARS Coronavirus 2 NEGATIVE NEGATIVE Final    Comment: (NOTE) SARS-CoV-2 target nucleic acids are NOT DETECTED. The SARS-CoV-2 RNA is generally detectable in upper and lower respiratory specimens during the acute phase of infection. Negative results do not preclude SARS-CoV-2 infection, do not rule out co-infections with other pathogens, and should not be used as the sole basis for treatment or other patient management decisions. Negative results must be combined with clinical observations, patient history, and epidemiological information. The expected result is Negative. Fact Sheet for Patients: SugarRoll.be Fact Sheet for Healthcare Providers: https://www.woods-mathews.com/ This test is not yet approved or cleared by the [place] FDA and  has been authorized for detection and/or diagnosis of SARS-CoV-2 by FDA under an Emergency Use Authorization (EUA). This EUA will remain  in effect (meaning this test can be used) for the duration of  the COVID-19 declaration under Section 56 4(b)(1) of the Act, 21 U.S.C. section 360bbb-3(b)(1), unless the authorization is terminated or revoked sooner. Performed at [facility], [place] [address] [place], [place] [address]   Culture, blood (routine x 2)     Status: None (Preliminary result)   Collection Time: 11/24/19  4:05 PM   Specimen: BLOOD LEFT HAND  Result Value Ref Range Status   Specimen Description BLOOD  LEFT HAND  Final   Special Requests   Final    BOTTLES DRAWN AEROBIC AND ANAEROBIC Blood Culture adequate volume   Culture   Final    NO GROWTH 4 DAYS Performed at [facility], [address] [address], [place], [place] [address]    Report Status PENDING  Incomplete  Culture, blood (routine x 2)     Status: None (Preliminary result)   Collection Time: 11/24/19  4:15 PM   Specimen: BLOOD RIGHT HAND  Result Value Ref Range Status   Specimen Description BLOOD RIGHT HAND  Final   Special Requests   Final    BOTTLES DRAWN AEROBIC ONLY Blood Culture results may not be optimal due to an inadequate volume of blood received in culture bottles   Culture   Final    NO GROWTH 4 DAYS Performed at [facility], [address] [place], [place] [address]    Report Status PENDING  Incomplete  SARS CORONAVIRUS 2 (TAT 6-24 HRS) Nasopharyngeal Nasopharyngeal Swab     Status: None   Collection Time: 11/27/19  2:00 PM   Specimen: Nasopharyngeal Swab  Result Value Ref Range Status   SARS Coronavirus 2 NEGATIVE NEGATIVE Final    Comment: (NOTE) SARS-CoV-2 target nucleic acids are NOT DETECTED. The SARS-CoV-2 RNA is generally detectable in upper and lower respiratory specimens during the acute phase of infection. Negative results do not preclude SARS-CoV-2 infection, do not rule out co-infections with other pathogens, and should not be used as the sole basis for treatment or other patient management decisions. Negative results must be combined with clinical  observations, patient history, and epidemiological information. The expected result is Negative. Fact Sheet for Patients: HairSlick.no Fact Sheet for Healthcare Providers: quierodirigir.com This test is not yet approved or cleared by the [place] FDA and  has been authorized for detection and/or diagnosis of SARS-CoV-2 by FDA under an Emergency Use Authorization (EUA). This EUA will remain  in effect (meaning this test can be used) for the duration of the COVID-19 declaration under Section 56 4(b)(1) of the Act, 21 U.S.C. section 360bbb-3(b)(1), unless the authorization is terminated or revoked sooner. Performed at [facility], [address] [place], [place] [address]      Labs: Basic Metabolic Panel: Recent Labs  Lab 11/23/19 1849 11/23/19 1850 11/24/19 0438 11/25/19 0313 11/27/19 0235  NA 136 137 136 137 138  K 3.9 3.9 4.3 3.7 3.5  CL 100 99 102 102 102  CO2 24  --  21* 22 25  GLUCOSE 154* 154* 105* 127* 126*  BUN 29*  CREATININE 0.93 0.80 0.81 0.89 0.87  CALCIUM 9.1  --  8.7* 8.7* 8.9  MG  --   --   --   --  2.2   Liver Function Tests: Recent Labs  Lab 11/23/19 1849  AST 29  ALT 18  ALKPHOS 79  BILITOT 0.6  PROT 7.4  ALBUMIN 3.9   No results for input(s): LIPASE, AMYLASE in the last 168 hours. Recent Labs  Lab 11/24/19 0001  AMMONIA 16   CBC: Recent Labs  Lab 11/23/19 1849 11/23/19 1850 11/24/19 0438 11/25/19 0313 11/27/19 0235  WBC 6.6  --  6.9 5.4 4.5  NEUTROABS 4.7  --   --  3.2 2.9  HGB 16.8* 18.0* 15.7* 16.2* 16.8*  HCT 51.3* 53.0* 47.7* 48.2* 50.5*  MCV 92.9  --  92.6 89.8 90.5  PLT 192  --  181  192 155   Cardiac Enzymes: No results for input(s): CKTOTAL, CKMB, CKMBINDEX, TROPONINI in the last 168 hours. BNP: BNP (last 3 results) No results for input(s): BNP in the last 8760 hours.  ProBNP (last 3 results) No results for input(s): PROBNP in the last 8760  hours.  CBG: Recent Labs  Lab 11/25/19 0804 11/26/19 0817 11/28/19 0102 11/28/19 0631 11/28/19 0828  GLUCAP 116* 120* 110* 109* 96       Signed:  Gwenyth Bender NP  [facility] 11/28/2019, 9:43 AM    Medical screening examination/treatment was performed by myself, documentation was performed by qualified clinical staff member and as supervising physician I was immediately available for consultation/collaboration. I have reviewed documentation and agree with assessment and plan.  Sunnie Nielsen, DO

## 2019-11-29 LAB — CULTURE, BLOOD (ROUTINE X 2)
Culture: NO GROWTH
Culture: NO GROWTH
Special Requests: ADEQUATE

## 2019-12-02 ENCOUNTER — Other Ambulatory Visit: Payer: Self-pay

## 2019-12-02 ENCOUNTER — Inpatient Hospital Stay (HOSPITAL_COMMUNITY)
Admission: EM | Admit: 2019-12-02 | Discharge: 2019-12-13 | DRG: 871 | Disposition: A | Discharge status: Skilled Nursing Facility | Payer: Medicare PPO | Source: Skilled Nursing Facility | Attending: Internal Medicine | Admitting: Internal Medicine

## 2019-12-02 ENCOUNTER — Emergency Department (HOSPITAL_COMMUNITY): Payer: Medicare PPO

## 2019-12-02 DIAGNOSIS — Z8249 Family history of ischemic heart disease and other diseases of the circulatory system: Secondary | ICD-10-CM | POA: Diagnosis not present

## 2019-12-02 DIAGNOSIS — Y95 Nosocomial condition: Secondary | ICD-10-CM | POA: Diagnosis present

## 2019-12-02 DIAGNOSIS — M353 Polymyalgia rheumatica: Secondary | ICD-10-CM | POA: Diagnosis present

## 2019-12-02 DIAGNOSIS — E860 Dehydration: Secondary | ICD-10-CM | POA: Diagnosis present

## 2019-12-02 DIAGNOSIS — A419 Sepsis, unspecified organism: Secondary | ICD-10-CM

## 2019-12-02 DIAGNOSIS — E870 Hyperosmolality and hypernatremia: Secondary | ICD-10-CM | POA: Diagnosis present

## 2019-12-02 DIAGNOSIS — Z66 Do not resuscitate: Secondary | ICD-10-CM | POA: Diagnosis present

## 2019-12-02 DIAGNOSIS — U071 COVID-19: Secondary | ICD-10-CM | POA: Diagnosis present

## 2019-12-02 DIAGNOSIS — Z981 Arthrodesis status: Secondary | ICD-10-CM

## 2019-12-02 DIAGNOSIS — F03B0 Unspecified dementia, moderate, without behavioral disturbance, psychotic disturbance, mood disturbance, and anxiety: Secondary | ICD-10-CM | POA: Diagnosis present

## 2019-12-02 DIAGNOSIS — F0391 Unspecified dementia with behavioral disturbance: Secondary | ICD-10-CM | POA: Diagnosis present

## 2019-12-02 DIAGNOSIS — J1289 Other viral pneumonia: Secondary | ICD-10-CM | POA: Diagnosis not present

## 2019-12-02 DIAGNOSIS — Z87891 Personal history of nicotine dependence: Secondary | ICD-10-CM | POA: Diagnosis not present

## 2019-12-02 DIAGNOSIS — G9341 Metabolic encephalopathy: Secondary | ICD-10-CM | POA: Diagnosis present

## 2019-12-02 DIAGNOSIS — I471 Supraventricular tachycardia: Secondary | ICD-10-CM | POA: Diagnosis present

## 2019-12-02 DIAGNOSIS — G9340 Encephalopathy, unspecified: Secondary | ICD-10-CM | POA: Diagnosis present

## 2019-12-02 DIAGNOSIS — Z7189 Other specified counseling: Secondary | ICD-10-CM | POA: Diagnosis not present

## 2019-12-02 DIAGNOSIS — A4189 Other specified sepsis: Principal | ICD-10-CM | POA: Diagnosis present

## 2019-12-02 DIAGNOSIS — R7989 Other specified abnormal findings of blood chemistry: Secondary | ICD-10-CM | POA: Diagnosis present

## 2019-12-02 DIAGNOSIS — I10 Essential (primary) hypertension: Secondary | ICD-10-CM | POA: Diagnosis present

## 2019-12-02 DIAGNOSIS — F03918 Unspecified dementia, unspecified severity, with other behavioral disturbance: Secondary | ICD-10-CM

## 2019-12-02 DIAGNOSIS — Z96611 Presence of right artificial shoulder joint: Secondary | ICD-10-CM | POA: Diagnosis present

## 2019-12-02 DIAGNOSIS — R0902 Hypoxemia: Secondary | ICD-10-CM | POA: Diagnosis present

## 2019-12-02 DIAGNOSIS — F0390 Unspecified dementia without behavioral disturbance: Secondary | ICD-10-CM | POA: Diagnosis not present

## 2019-12-02 DIAGNOSIS — J189 Pneumonia, unspecified organism: Secondary | ICD-10-CM | POA: Diagnosis present

## 2019-12-02 DIAGNOSIS — Z781 Physical restraint status: Secondary | ICD-10-CM | POA: Diagnosis not present

## 2019-12-02 DIAGNOSIS — Z515 Encounter for palliative care: Secondary | ICD-10-CM | POA: Diagnosis not present

## 2019-12-02 DIAGNOSIS — J1282 Pneumonia due to coronavirus disease 2019: Secondary | ICD-10-CM | POA: Diagnosis present

## 2019-12-02 LAB — C-REACTIVE PROTEIN: CRP: 14.8 mg/dL — ABNORMAL HIGH (ref <1.0)

## 2019-12-02 LAB — SEDIMENTATION RATE: Sed Rate: 14 mm/hr (ref 0–22)

## 2019-12-02 LAB — COMPREHENSIVE METABOLIC PANEL
ALT: 24 U/L (ref 0–44)
AST: 31 U/L (ref 15–41)
Albumin: 2.6 g/dL — ABNORMAL LOW (ref 3.5–5.0)
Alkaline Phosphatase: 49 U/L (ref 38–126)
Anion gap: 11 (ref 5–15)
BUN: 43 mg/dL — ABNORMAL HIGH (ref 8–23)
CO2: 24 mmol/L (ref 22–32)
Calcium: 8.2 mg/dL — ABNORMAL LOW (ref 8.9–10.3)
Chloride: 108 mmol/L (ref 98–111)
Creatinine, Ser: 1.23 mg/dL — ABNORMAL HIGH (ref 0.44–1.00)
GFR calc Af Amer: 47 mL/min — ABNORMAL LOW (ref >60)
GFR calc non Af Amer: 41 mL/min — ABNORMAL LOW (ref >60)
Glucose, Bld: 147 mg/dL — ABNORMAL HIGH (ref 70–99)
Potassium: 4.2 mmol/L (ref 3.5–5.1)
Sodium: 143 mmol/L (ref 135–145)
Total Bilirubin: 0.4 mg/dL (ref 0.3–1.2)
Total Protein: 6.2 g/dL — ABNORMAL LOW (ref 6.5–8.1)

## 2019-12-02 LAB — CBC WITH DIFFERENTIAL/PLATELET
Abs Immature Granulocytes: 0.08 10*3/uL — ABNORMAL HIGH (ref 0.00–0.07)
Basophils Absolute: 0 10*3/uL (ref 0.0–0.1)
Basophils Relative: 0 %
Eosinophils Absolute: 0 10*3/uL (ref 0.0–0.5)
Eosinophils Relative: 0 %
HCT: 54.2 % — ABNORMAL HIGH (ref 36.0–46.0)
Hemoglobin: 17.6 g/dL — ABNORMAL HIGH (ref 12.0–15.0)
Immature Granulocytes: 1 %
Lymphocytes Relative: 6 %
Lymphs Abs: 0.7 10*3/uL (ref 0.7–4.0)
MCH: 29.9 pg (ref 26.0–34.0)
MCHC: 32.5 g/dL (ref 30.0–36.0)
MCV: 92 fL (ref 80.0–100.0)
Monocytes Absolute: 0.6 10*3/uL (ref 0.1–1.0)
Monocytes Relative: 5 %
Neutro Abs: 9.6 10*3/uL — ABNORMAL HIGH (ref 1.7–7.7)
Neutrophils Relative %: 88 %
Platelets: 132 10*3/uL — ABNORMAL LOW (ref 150–400)
RBC: 5.89 MIL/uL — ABNORMAL HIGH (ref 3.87–5.11)
RDW: 13.9 % (ref 11.5–15.5)
WBC: 10.9 10*3/uL — ABNORMAL HIGH (ref 4.0–10.5)
nRBC: 0 % (ref 0.0–0.2)

## 2019-12-02 LAB — URINALYSIS, ROUTINE W REFLEX MICROSCOPIC
Bilirubin Urine: NEGATIVE
Glucose, UA: NEGATIVE mg/dL
Hgb urine dipstick: NEGATIVE
Ketones, ur: NEGATIVE mg/dL
Leukocytes,Ua: NEGATIVE
Nitrite: NEGATIVE
Protein, ur: 100 mg/dL — AB
Specific Gravity, Urine: 1.029 (ref 1.005–1.030)
pH: 5 (ref 5.0–8.0)

## 2019-12-02 LAB — LACTIC ACID, PLASMA: Lactic Acid, Venous: 2.1 mmol/L (ref 0.5–1.9)

## 2019-12-02 LAB — RESPIRATORY PANEL BY RT PCR (FLU A&B, COVID)
Influenza A by PCR: NEGATIVE
Influenza B by PCR: NEGATIVE
SARS Coronavirus 2 by RT PCR: POSITIVE — AB

## 2019-12-02 LAB — POC SARS CORONAVIRUS 2 AG -  ED: SARS Coronavirus 2 Ag: NEGATIVE

## 2019-12-02 MED ORDER — SODIUM CHLORIDE 0.9 % IV BOLUS
1000.0000 mL | Freq: Once | INTRAVENOUS | Status: AC
  Administered 2019-12-02: 1000 mL via INTRAVENOUS

## 2019-12-02 MED ORDER — SODIUM CHLORIDE 0.9 % IV SOLN: INTRAVENOUS | Status: DC

## 2019-12-02 MED ORDER — VANCOMYCIN HCL IN DEXTROSE 1-5 GM/200ML-% IV SOLN
1000.0000 mg | Freq: Once | INTRAVENOUS | Status: AC
  Administered 2019-12-02: 16:00:00 1000 mg via INTRAVENOUS
  Filled 2019-12-02: qty 200

## 2019-12-02 MED ORDER — SODIUM CHLORIDE 0.9 % IV SOLN
2.0000 g | Freq: Once | INTRAVENOUS | Status: AC
  Administered 2019-12-02: 16:00:00 2 g via INTRAVENOUS
  Filled 2019-12-02: qty 2

## 2019-12-02 MED ORDER — SODIUM CHLORIDE 0.9 % IV SOLN
2.0000 g | Freq: Two times a day (BID) | INTRAVENOUS | Status: AC
  Administered 2019-12-03 – 2019-12-09 (×14): 2 g via INTRAVENOUS
  Filled 2019-12-02 (×14): qty 2

## 2019-12-02 MED ORDER — VANCOMYCIN HCL 1250 MG/250ML IV SOLN
1250.0000 mg | INTRAVENOUS | Status: DC
  Filled 2019-12-02: qty 250

## 2019-12-02 MED ORDER — ENOXAPARIN SODIUM 40 MG/0.4ML ~~LOC~~ SOLN
40.0000 mg | SUBCUTANEOUS | Status: DC
  Administered 2019-12-02 – 2019-12-12 (×11): 40 mg via SUBCUTANEOUS
  Filled 2019-12-02 (×11): qty 0.4

## 2019-12-02 NOTE — ED Provider Notes (Signed)
[facility] EMERGENCY DEPARTMENT Provider Note   CSN: [id number] Arrival date & time: 12/02/19  1348     History Chief Complaint  Patient presents with  . Code Sepsis    Shirley [name] is a 85 y.o. female. Level 5 caveat due to dementia. HPI Sent in from nursing home.  Worried for infection.  Had sats of 80% on room air.  Also reportedly tachycardic 120 and respiratory rate of 40.  Has not been eating much.  Recent mission in the hospital has been on Augmentin for potential dental infection.  Apparently however has been having fevers over the last 8 days.  Baseline confused.  Reported mental status improved somewhat after fluid bolus by EMS.  Patient really cannot provide much history.  History of polymyalgia rheumatica    Past Medical History:  Diagnosis Date  . Arthritis    "right shoulder; that's why I had to have it replaced"  . Hypertension   . Osteoarthritis   . Osteoporosis   . PMR (polym[facility]algia rheumatica) [facility])     Patient Active Problem List   Diagnosis Date Noted  . Palliative care by specialist   . Goals of care, counseling/discussion   . DNR (do not resuscitate)   . Acute encephalopathy 11/23/2019  . Ascending aortic aneurysm ([place]) 11/23/2019  . Moderate dementia ([place]) 08/10/2015  . Frequent falls 05/08/2015  . Anxiety 05/08/2015  . Anxiety state, unspecified 03/06/2014  . Short-term memory loss 12/30/2013  . Lumbar degenerative disc disease 08/22/2011  . Polymyalgia rheumatica ([place]) 05/16/2008  . Essential hypertension 03/12/2008  . Arthritis, senescent 03/12/2008  . MYALGIA 03/12/2008  . OSTEOPOROSIS 03/12/2008    Past Surgical History:  Procedure Laterality Date  . CATARACT EXTRACTION W/ INTRAOCULAR LENS  IMPLANT, BILATERAL     2008   . HIP PINNING,CANNULATED Right 10/20/2013   Procedure: CANNULATED HIP PINNING;  Surgeon: Marybelle Killings, MD;  Location: [facility];  Service: Orthopedics;  Laterality: Right;  . KYPHOPLASTY N/A  02/28/2013   Procedure: KYPHOPLASTY;  Surgeon: Ophelia Charter, MD;  Location: [facility];  Service: Neurosurgery;  Laterality: N/A;  Lumbar two Kyphoplasty  . POSTERIOR FUSION LUMBAR SPINE  03/28/12  . SHOULDER ARTHROSCOPY W/ ROTATOR CUFF REPAIR  ~ 01/2011   right  . TOTAL SHOULDER REPLACEMENT  06/2011   right  . VAGINAL HYSTERECTOMY  1970's     OB History    Gravida  3   Para      Term      Preterm      AB      Living        SAB      TAB      Ectopic      Multiple      Live Births              Family History  Problem Relation Age of Onset  . ALS Father   . Hypertension Sister     Social History   Tobacco Use  . Smoking status: Former Smoker    Packs/day: 0.50    Years: 40.00    Pack years: 20.00    Types: Cigarettes    Quit date: 12/06/1991    Years since quitting: 28.0  . Smokeless tobacco: Never Used  Substance Use Topics  . Alcohol use: Yes    Alcohol/week: 0.0 standard drinks    Comment: occ  . Drug use: No    Home Medications Prior to Admission medications  Medication Sig Start Date End Date Taking? Authorizing Provider  amoxicillin-clavulanate (AUGMENTIN) 875-125 MG tablet Take 1 tablet by mouth every 12 (twelve) hours for 7 days. 11/28/19 12/05/19  Gwenyth Bender, NP  citalopram (CELEXA) 10 MG tablet TAKE 2 TABLETS EVERY DAY Patient taking differently: Take 20 mg by mouth See admin instructions. Take one tablet by mouth on Monday, Wednesday and Fridays. 04/17/18   Roderick Pee, MD  donepezil (ARICEPT) 10 MG tablet TAKE 1 TABLET EVERY DAY Patient taking differently: Take 10 mg by mouth at bedtime.  11/29/17   Van Clines, MD  metoprolol succinate (TOPROL-XL) 25 MG 24 hr tablet Take 25 mg by mouth at bedtime. 08/20/18   [provider]  triamcinolone cream (KENALOG) 0.1 % Compound 1;1 with Eucerin cream and apply bid prn rash. Patient taking differently: Apply 1 application topically 2 (two) times daily. Apply to arms and  back 04/25/18   Burchette, Elberta Fortis, MD  vitamin B-12 (CYANOCOBALAMIN) 1000 MCG tablet Take 2 tablets (2,000 mcg total) by mouth daily. 11/28/19   Black, Lesle Chris, NP    Allergies    Patient has no known allergies.  Review of Systems   Review of Systems  Unable to perform ROS: Dementia    Physical Exam Updated Vital Signs BP 124/88   Pulse 94   Temp (!) 97.4 F (36.3 C) (Axillary)   Resp (!) 34   Ht 5\' 5"  (1.651 m)   Wt 59.5 kg   SpO2 95%   BMI 21.83 kg/m   Physical Exam Vitals reviewed.  HENT:     Head: Atraumatic.     Mouth/Throat:     Mouth: Mucous membranes are dry.  Eyes:     Extraocular Movements: Extraocular movements intact.  Cardiovascular:     Rate and Rhythm: Tachycardia present.  Pulmonary:     Comments: Harsh breath sounds particularly on left. Abdominal:     Tenderness: There is abdominal tenderness.     Comments: Mild diffuse tenderness without rebound or guarding.  Musculoskeletal:     Right lower leg: No edema.     Left lower leg: No edema.  Skin:    General: Skin is warm.     Capillary Refill: Capillary refill takes less than 2 seconds.  Neurological:     Comments: Sitting in bed with head tipped back.  Will look to voice but will not speak.  Will squeeze hands tightly to command.     ED Results / Procedures / Treatments   Labs (all labs ordered are listed, but only abnormal results are displayed) Labs Reviewed  CBC WITH DIFFERENTIAL/PLATELET - Abnormal; Notable for the following components:      Result Value   WBC 10.9 (*)    RBC 5.89 (*)    Hemoglobin 17.6 (*)    HCT 54.2 (*)    Platelets 132 (*)    Neutro Abs 9.6 (*)    Abs Immature Granulocytes 0.08 (*)    All other components within normal limits  COMPREHENSIVE METABOLIC PANEL - Abnormal; Notable for the following components:   Glucose, Bld 147 (*)    BUN 43 (*)    Creatinine, Ser 1.23 (*)    Calcium 8.2 (*)    Total Protein 6.2 (*)    Albumin 2.6 (*)    GFR calc non Af Amer  41 (*)    GFR calc Af Amer 47 (*)    All other components within normal limits  C-REACTIVE PROTEIN - Abnormal;  Notable for the following components:   CRP 14.8 (*)    All other components within normal limits  CULTURE, BLOOD (ROUTINE X 2)  CULTURE, BLOOD (ROUTINE X 2)  URINE CULTURE  RESPIRATORY PANEL BY RT PCR (FLU A&B, COVID)  URINALYSIS, ROUTINE W REFLEX MICROSCOPIC  SEDIMENTATION RATE  LACTIC ACID, PLASMA  LACTIC ACID, PLASMA  POC SARS CORONAVIRUS 2 AG -  ED    EKG None  Radiology DG Chest Portable 1 View  Result Date: 12/02/2019 CLINICAL DATA:  Hypoxia EXAM: PORTABLE CHEST 1 VIEW COMPARISON:  November 23, 2019 FINDINGS: There is somewhat ill-defined airspace opacity in the right lower lung region. There is mild atelectatic change in the right mid and lower lung regions as well as in the left mid lung and left base regions without frank consolidation. Heart is upper normal in size with pulmonary vascularity normal. No adenopathy. There is a fairly sizable hiatal hernia. There is aortic atherosclerosis. No adenopathy. There is a total shoulder replacement on the right. IMPRESSION: Ill-defined airspace opacity on the right in the right lower lung region, consistent with developing pneumonia. Areas of mild atelectatic change in each mid and lower lung zone region Heart size within normal limits. Aortic Atherosclerosis (ICD10-I70.0). Hiatal hernia present. Electronically Signed   By: Bretta BangWilliam  Woodruff III M.D.   On: 12/02/2019 14:42    Procedures Procedures (including critical care time)  Medications Ordered in ED Medications  ceFEPIme (MAXIPIME) 2 g in sodium chloride 0.9 % 100 mL IVPB (has no administration in time range)  vancomycin (VANCOCIN) IVPB 1000 mg/200 mL premix (has no administration in time range)  sodium chloride 0.9 % bolus 1,000 mL (has no administration in time range)  vancomycin (VANCOREADY) IVPB 1250 mg/250 mL (has no administration in time range)  ceFEPIme  (MAXIPIME) 2 g in sodium chloride 0.9 % 100 mL IVPB (has no administration in time range)    ED Course  I have reviewed the triage vital signs and the nursing notes.  Pertinent labs & imaging results that were available during my care of the patient were reviewed by me and considered in my medical decision making (see chart for details).    MDM Rules/Calculators/A&P                      Patient brought in for mental status changes.  Hypoxia.  Pneumonia on x-ray.  Reviewed records from previous and recent admission.  States that she is a DNR with somewhat limited interventions.  Would not want intubation.  Hospital and antibiotics as needed but to determine the need.  With somewhat severe disease right now I feel the patient would benefit from brief admission to the hospital with further discussions about goals of care.  Will discuss with hospitalist.  Patient not a code sepsis due to limited interventions. Final Clinical Impression(s) / ED Diagnoses Final diagnoses:  Healthcare-associated pneumonia    Rx / DC Orders ED Discharge Orders    None       Benjiman CorePickering, Maijor Hornig, MD 12/02/19 1555

## 2019-12-02 NOTE — Progress Notes (Signed)
Pharmacy Antibiotic Note  Shirley [name] is a 85 y.o. female admitted on 12/02/2019 with pneumonia.  Pharmacy has been consulted for vancomycin and cefepime dosing. Pt is afebrile but WBC is mildly elevated at 10.9. SCr is above baseline at 1.23. Pt was prescribed augmentin PTA for a dental infection.   Plan: Vancomycin 1gm IV x 1 then 1250mg  IV Q48H Cefepime 2gm IV Q12H F/u renal fxn, C&S, clinical status and peak/trough at SS     Temp (24hrs), Avg:97.4 F (36.3 C), Min:97.4 F (36.3 C), Max:97.4 F (36.3 C)  Recent Labs  Lab 11/27/19 0235 12/02/19 1422  WBC 4.5 10.9*  CREATININE 0.87 1.23*    CrCl cannot be calculated (Unknown ideal weight.).    No Known Allergies  Antimicrobials this admission: Vanc 12/28>> Cefepime 12/28>>  Dose adjustments this admission: N/A  Microbiology results: Pending  Thank you for allowing pharmacy to be a part of this patient's care.  Markis Langland, Rande Lawman 12/02/2019 3:24 PM

## 2019-12-02 NOTE — Progress Notes (Signed)
Report given to 5W RN

## 2019-12-02 NOTE — ED Notes (Signed)
Pt unable to ambulate to perform pulse ox while ambulating orders

## 2019-12-02 NOTE — H&P (Signed)
History and Physical    Shirley [name] NLZ:[id number] DOB: May 23, 1934 DOA: 12/02/2019  PCP: Roderick Pee, MD (Inactive)  Patient coming from:  [facility]; NOK: Shirley [name], [phone]  Chief Complaint:  sepsis  HPI: Shirley [name] is a 85 y.o. female with medical history significant of dementia; HTN; PMR; and recent hospitalization presenting with code sepsis.  She was previously hospitalized from 12/19-24 with acute metabolic encephalopathy and fever of uncertain source.  MOST form was completed with DNR/DNI; limited additional interventions including rehospitalization, limited abx, IVF, and time trial feeding tube after discussion with family.  She had poor PO intake, tachycardia, tachypnea at her facility with O2 sats 80s, improved to 96% with 3L  O2.  GCS 9 in the field, improved with IVF bolus.  The facility told her son the HR was increased, and this was the same reason she was here last time.  Family has not seen her in a long time.  Her son had a double lung transplant on Feb 9, last saw her Feb 8 prior to last hospitalization.  He recognizes that her dementia has gotten worse over the last 10 months.  At the time of last d/c, she was talking a lot but all over the place, alert.   She also slept a lot while she was here last time.   ED Course:  Recently admitted.  Called code stroke, not code stroke.  Dental infection, given Augmentin and sent back.  Sats 80, HR 120, appears to have PNA.  Previously with palliative care consult, limited care.  Temp 100.3.  Review of Systems:  Unable to perform    Past Medical History:  Diagnosis Date  . Arthritis    "right shoulder; that's why I had to have it replaced"  . Hypertension   . Osteoarthritis   . Osteoporosis   . PMR (polymyalgia rheumatica) (HCC)     Past Surgical History:  Procedure Laterality Date  . CATARACT EXTRACTION W/ INTRAOCULAR LENS  IMPLANT, BILATERAL     2008   . HIP PINNING,CANNULATED  Right 10/20/2013   Procedure: CANNULATED HIP PINNING;  Surgeon: Eldred Manges, MD;  Location: [facility];  Service: Orthopedics;  Laterality: Right;  . KYPHOPLASTY N/A 02/28/2013   Procedure: KYPHOPLASTY;  Surgeon: Cristi Loron, MD;  Location: [facility];  Service: Neurosurgery;  Laterality: N/A;  Lumbar two Kyphoplasty  . POSTERIOR FUSION LUMBAR SPINE  03/28/12  . SHOULDER ARTHROSCOPY W/ ROTATOR CUFF REPAIR  ~ 01/2011   right  . TOTAL SHOULDER REPLACEMENT  06/2011   right  . VAGINAL HYSTERECTOMY  1970's    Social History   Socioeconomic History  . Marital status: Married    Spouse name: Not on file  . Number of children: 3  . Years of education: Not on file  . Highest education level: Not on file  Occupational History  . Occupation: retired  Tobacco Use  . Smoking status: Former Smoker    Packs/day: 0.50    Years: 40.00    Pack years: 20.00    Types: Cigarettes    Quit date: 12/06/1991    Years since quitting: 28.0  . Smokeless tobacco: Never Used  Substance and Sexual Activity  . Alcohol use: Yes    Alcohol/week: 0.0 standard drinks    Comment: occ  . Drug use: No  . Sexual activity: Never  Other Topics Concern  . Not on file  Social History Narrative   Retired   Married  Alcohol use-no   Drug use-no   Regular Exercise-yes   Current Smoker         Social Determinants of Health   Financial Resource Strain:   . Difficulty of Paying Living Expenses: Not on file  Food Insecurity:   . Worried About Programme researcher, broadcasting/film/videounning Out of Food in the Last Year: Not on file  . Ran Out of Food in the Last Year: Not on file  Transportation Needs:   . Lack of Transportation (Medical): Not on file  . Lack of Transportation (Non-Medical): Not on file  Physical Activity:   . Days of Exercise per Week: Not on file  . Minutes of Exercise per Session: Not on file  Stress:   . Feeling of Stress : Not on file  Social Connections:   . Frequency of Communication with Friends and Family: Not on  file  . Frequency of Social Gatherings with Friends and Family: Not on file  . Attends Religious Services: Not on file  . Active Member of Clubs or Organizations: Not on file  . Attends BankerClub or Organization Meetings: Not on file  . Marital Status: Not on file  Intimate Partner Violence:   . Fear of Current or Ex-Partner: Not on file  . Emotionally Abused: Not on file  . Physically Abused: Not on file  . Sexually Abused: Not on file    No Known Allergies  Family History  Problem Relation Age of Onset  . ALS Father   . Hypertension Sister     Prior to Admission medications   Medication Sig Start Date End Date Taking? Authorizing Provider  amoxicillin-clavulanate (AUGMENTIN) 875-125 MG tablet Take 1 tablet by mouth every 12 (twelve) hours for 7 days. 11/28/19 12/05/19  Gwenyth BenderBlack, Karen M, NP  citalopram (CELEXA) 10 MG tablet TAKE 2 TABLETS EVERY DAY Patient taking differently: Take 20 mg by mouth See admin instructions. Take one tablet by mouth on Monday, Wednesday and Fridays. 04/17/18   Roderick Peeodd, Jeffrey A, MD  donepezil (ARICEPT) 10 MG tablet TAKE 1 TABLET EVERY DAY Patient taking differently: Take 10 mg by mouth at bedtime.  11/29/17   Van ClinesAquino, Karen M, MD  metoprolol succinate (TOPROL-XL) 25 MG 24 hr tablet Take 25 mg by mouth at bedtime. 08/20/18   [provider]  triamcinolone cream (KENALOG) 0.1 % Compound 1;1 with Eucerin cream and apply bid prn rash. Patient taking differently: Apply 1 application topically 2 (two) times daily. Apply to arms and back 04/25/18   Burchette, Elberta FortisBruce W, MD  vitamin B-12 (CYANOCOBALAMIN) 1000 MCG tablet Take 2 tablets (2,000 mcg total) by mouth daily. 11/28/19   Gwenyth BenderBlack, Karen M, NP    Physical Exam: Vitals:   12/02/19 1500 12/02/19 1515 12/02/19 1600 12/02/19 1620  BP:  117/78 120/77   Pulse:  93 90   Resp:  (!) 32 (!) 34   Temp:    100.3 F (37.9 C)  TempSrc:    Rectal  SpO2:  94% 95%   Weight: 59.5 kg     Height: 5\' 5"  (1.651 m)         . General:  Appears obtunded, minimally responsive to pain, GCS roughly 6 on my exam (although better with EDP) . Eyes:  Slightly open at rest, normal lids . ENT:  grossly normal lips & tongue, somewhat dry mm; poor dentition . Neck:  no LAD, masses or thyromegaly . Cardiovascular:  RRR, no m/r/g. No LE edema.  Marland Kitchen. Respiratory:   Scattered rhonchi.  Increased respiratory  effort with normal O2 sats on [place] O2 . Abdomen:  soft, apparently TTP (this was the biggest response I got), ND, NABS . Skin:  no rash or induration seen on limited exam . Musculoskeletal:  no bony abnormality, would not respond to commands . Psychiatric:  As described above, essentially obtunded . Neurologic:  Unable to perform    Radiological Exams on Admission: DG Chest Portable 1 View  Result Date: 12/02/2019 CLINICAL DATA:  Hypoxia EXAM: PORTABLE CHEST 1 VIEW COMPARISON:  November 23, 2019 FINDINGS: There is somewhat ill-defined airspace opacity in the right lower lung region. There is mild atelectatic change in the right mid and lower lung regions as well as in the left mid lung and left base regions without frank consolidation. Heart is upper normal in size with pulmonary vascularity normal. No adenopathy. There is a fairly sizable hiatal hernia. There is aortic atherosclerosis. No adenopathy. There is a total shoulder replacement on the right. IMPRESSION: Ill-defined airspace opacity on the right in the right lower lung region, consistent with developing pneumonia. Areas of mild atelectatic change in each mid and lower lung zone region Heart size within normal limits. Aortic Atherosclerosis (ICD10-I70.0). Hiatal hernia present. Electronically Signed   By: Bretta Bang III M.D.   On: 12/02/2019 14:42    EKG: Independently reviewed.  NSR with rate 95; nonspecific ST changes with no evidence of acute ischemia   Labs on Admission: I have personally reviewed the available labs and imaging studies at the time of  the admission.  Pertinent labs:   Glucose 147 BUN 43/Creatinine 1.23/GFR 41 Albumin 2.6 CRP 14.8 WBC 10.9 Hgb 17.6 Platelets 132 BD COVID negative   Assessment/Plan Principal Problem:   Sepsis due to pneumonia [facility]) Active Problems:   Essential hypertension   Polymyalgia rheumatica (HCC)   Moderate dementia (HCC)   Acute encephalopathy   Goals of care, counseling/discussion   DNR (do not resuscitate)   Sepsis due to PNA with marked encephalopathy -Patient with recent hospitalization (d/c 12/24) with fever early on, thought to be related to dental infection and discharged back to ALF on Augmentin -She has had poor PO intake with tachycardia and tachypnea -Found to be hypoxic, which improved with  O2 -She remains minimally responsive for me and appears to be nearing the end of her life -SIRS criteria in this patient includes: Leukocytosis, tachycardia, persistent tachypnea -Patient has evidence of acute organ failure with elevated lactate >2; encephalopathy -While awaiting blood cultures, this appears to be a preseptic condition. -Sepsis protocol initiated -Patient did not have initial lactate >4 or SBP <90/MAP <65 and so did not receive the 30 cc/kg IVF bolus. -Suspected source is PNA - based on hypoxia, tachypnea, and RLL infiltrate on CXR -High suspicion for aspiration PNA in the setting of advanced dementia and poor dentition -Blood and urine cultures pending -Will admit due to: hypoxemia; AMS that is severe or persistent; failure of outpatient treatment -Treat with IV Cefepime/Vanc for undifferentiated sepsis for HCAP given recent hospitalization and to cover anaerobic organisms -Will trend lactate to ensure improvement -Will order lower respiratory tract procalcitonin level.  Antibiotics would not be indicated for PCT <0.1 and probably should not be used for < 0.25.  >0.5 indicates infection and >>0.5 indicates more serious disease.  As the procalcitonin level  normalizes, it will be reasonable to consider de-escalation of antibiotic coverage.  Goals of care/DNR -Patient was previously thought to have a poor overall prognosis and palliative care consulted during hospitalization -DNR was  enacted and MOST form was completed -I discussed the patient's overall condition with her son and offered end of life comfort measures, but he prefers to try IVF and antibiotics for the next 24-48 hours and continue to monitor first -Palliative care consult requested -Her son is aware of the severity of her illness and the high likelihood for a poor outcome  Dementia -According to her son, she was alert and conversant (albeit nonsensical speech) during her last hospitalization -She is clearly different from that stated baseline at this time -Will keep NPO due to AMS -Hold Celexa and Aricept and resume when patient is more alert  HTN -BP has been normal to low-normal while in the ER -She is unable to take PO so will hold Toprol XL -Can add prn IV medication if her BP is uptrending before she is able to take PO  PMR -This condition is generally treated with long-term steroids -I do not see notation of recent treatment for this condition     Note: This patient has been tested and is negative for the novel coronavirus COVID-19 by [facility] testing.  Repeat PCR testing is pending, but given negative tests on 12/19 and 12/23 in conjunction with her presenting complaint, she appears to be unlikely to have active COVID-19 infection and so precautions will be discontinued at this time.    DVT prophylaxis:  Lovenox Code Status:  DNR - confirmed with family Family Communication: None present; I spoke with her son by telephone  Disposition Plan:  To be determined Consults called: Palliative care Admission status: Admit - It is my clinical opinion that admission to INPATIENT is reasonable and necessary because of the expectation that this patient will require hospital care  that crosses at least 2 midnights to treat this condition based on the medical complexity of the problems presented.  Given the aforementioned information, the predictability of an adverse outcome is felt to be significant.    Karmen Bongo MD [facility]   How to contact the [facility] Attending or Consulting provider [place] or covering provider during after hours [place], for this patient?  1. Check the care team in [facility] and look for a) attending/consulting [facility] provider listed and b) the [facility] team listed 2. Log into [email or web address] and use [facility]'s universal password to access. If you do not have the password, please contact the hospital operator. 3. Locate the [facility] provider you are looking for under [facility] and page to a number that you can be directly reached. 4. If you still have difficulty reaching the provider, please page the [facility] (Director on Call) for the Hospitalists listed on amion for assistance.   12/02/2019, 5:54 PM

## 2019-12-02 NOTE — Progress Notes (Signed)
CRITICAL VALUE ALERT  Critical Value:  Coronavirus (+)  Date & Time Notied:  12/02/19 20:24  Provider Notified: Denny,NP 12/02/19 20:40  Orders Received/Actions taken: Placed request for transfer to 5W.

## 2019-12-02 NOTE — ED Triage Notes (Signed)
Pt presents from facility for possible code sepsis, has been recently treated for dental infxn with abx, MD that evaluated her this AM noted tachycardia (120bpm) and tachypnea (RR40) and decreassed oral intake. EMS arrived to pt O2 sats 80s, increased to 96% with 3L. Pt is baseline confused, incoherent speech. GCS  9 in field, noted more alert after 850cc fluid bolus by EMS.  Pt has DNR that staff was unable to locate to send with EMS, has palliative care consult

## 2019-12-02 NOTE — Progress Notes (Signed)
Patient's son,Todd was notified of patient's transfer to 5W21. Dezyrae Kensinger, Wonda Cheng, Therapist, sports

## 2019-12-02 NOTE — Progress Notes (Signed)
New Admission Note:   Arrival Method: Arrived from [facility] ED via stretcher Mental Orientation: Alert but disoriented x4 Telemetry: Box #12 Assessment: Completed Skin: See doc flowsheet IV: Rt AC, Lt AC Pain: Denies Tubes: N/A Safety Measures: Safety Fall Prevention Plan has been discussed.  Admission: Completed 5MW Orientation: Patient has been orientated to the room, unit and staff.  Family:None at bedside  Orders have been reviewed and implemented. Will continue to monitor the patient. Call light has been placed within reach and bed alarm has been activated.   Seaira Byus [employer], RN-BC Phone number: [phone]

## 2019-12-03 DIAGNOSIS — F0390 Unspecified dementia without behavioral disturbance: Secondary | ICD-10-CM

## 2019-12-03 DIAGNOSIS — J1282 Pneumonia due to coronavirus disease 2019: Secondary | ICD-10-CM | POA: Diagnosis present

## 2019-12-03 DIAGNOSIS — M353 Polymyalgia rheumatica: Secondary | ICD-10-CM

## 2019-12-03 DIAGNOSIS — E870 Hyperosmolality and hypernatremia: Secondary | ICD-10-CM

## 2019-12-03 DIAGNOSIS — I10 Essential (primary) hypertension: Secondary | ICD-10-CM

## 2019-12-03 DIAGNOSIS — J1289 Other viral pneumonia: Secondary | ICD-10-CM

## 2019-12-03 LAB — CBC WITH DIFFERENTIAL/PLATELET
Abs Immature Granulocytes: 0.13 10*3/uL — ABNORMAL HIGH (ref 0.00–0.07)
Basophils Absolute: 0.1 10*3/uL (ref 0.0–0.1)
Basophils Relative: 0 %
Eosinophils Absolute: 0 10*3/uL (ref 0.0–0.5)
Eosinophils Relative: 0 %
HCT: 56.2 % — ABNORMAL HIGH (ref 36.0–46.0)
Hemoglobin: 17.3 g/dL — ABNORMAL HIGH (ref 12.0–15.0)
Immature Granulocytes: 1 %
Lymphocytes Relative: 9 %
Lymphs Abs: 1.2 10*3/uL (ref 0.7–4.0)
MCH: 29.9 pg (ref 26.0–34.0)
MCHC: 30.8 g/dL (ref 30.0–36.0)
MCV: 97.2 fL (ref 80.0–100.0)
Monocytes Absolute: 0.7 10*3/uL (ref 0.1–1.0)
Monocytes Relative: 5 %
Neutro Abs: 11.6 10*3/uL — ABNORMAL HIGH (ref 1.7–7.7)
Neutrophils Relative %: 85 %
Platelets: 137 10*3/uL — ABNORMAL LOW (ref 150–400)
RBC: 5.78 MIL/uL — ABNORMAL HIGH (ref 3.87–5.11)
RDW: 14.2 % (ref 11.5–15.5)
WBC: 13.7 10*3/uL — ABNORMAL HIGH (ref 4.0–10.5)
nRBC: 0 % (ref 0.0–0.2)

## 2019-12-03 LAB — BASIC METABOLIC PANEL
Anion gap: 16 — ABNORMAL HIGH (ref 5–15)
BUN: 41 mg/dL — ABNORMAL HIGH (ref 8–23)
CO2: 18 mmol/L — ABNORMAL LOW (ref 22–32)
Calcium: 8.1 mg/dL — ABNORMAL LOW (ref 8.9–10.3)
Chloride: 113 mmol/L — ABNORMAL HIGH (ref 98–111)
Creatinine, Ser: 1.06 mg/dL — ABNORMAL HIGH (ref 0.44–1.00)
GFR calc Af Amer: 56 mL/min — ABNORMAL LOW (ref >60)
GFR calc non Af Amer: 49 mL/min — ABNORMAL LOW (ref >60)
Glucose, Bld: 105 mg/dL — ABNORMAL HIGH (ref 70–99)
Potassium: 4.6 mmol/L (ref 3.5–5.1)
Sodium: 147 mmol/L — ABNORMAL HIGH (ref 135–145)

## 2019-12-03 LAB — PROTIME-INR
INR: 1.3 — ABNORMAL HIGH (ref 0.8–1.2)
Prothrombin Time: 16.5 seconds — ABNORMAL HIGH (ref 11.4–15.2)

## 2019-12-03 LAB — URINE CULTURE: Culture: NO GROWTH

## 2019-12-03 LAB — MRSA PCR SCREENING: MRSA by PCR: NEGATIVE

## 2019-12-03 LAB — C-REACTIVE PROTEIN: CRP: 16.2 mg/dL — ABNORMAL HIGH (ref <1.0)

## 2019-12-03 LAB — FERRITIN: Ferritin: 1036 ng/mL — ABNORMAL HIGH (ref 11–307)

## 2019-12-03 LAB — LACTIC ACID, PLASMA
Lactic Acid, Venous: 5.5 mmol/L (ref 0.5–1.9)
Lactic Acid, Venous: 1.7 mmol/L (ref 0.5–1.9)

## 2019-12-03 LAB — PROCALCITONIN: Procalcitonin: 0.21 ng/mL

## 2019-12-03 LAB — APTT: aPTT: 33 seconds (ref 24–36)

## 2019-12-03 LAB — D-DIMER, QUANTITATIVE: D-Dimer, Quant: 3.49 ug/mL-FEU — ABNORMAL HIGH (ref 0.00–0.50)

## 2019-12-03 MED ORDER — SODIUM CHLORIDE 0.9 % IV BOLUS
500.0000 mL | Freq: Once | INTRAVENOUS | Status: AC
  Administered 2019-12-03: 07:00:00 500 mL via INTRAVENOUS

## 2019-12-03 MED ORDER — METHYLPREDNISOLONE SODIUM SUCC 125 MG IJ SOLR
60.0000 mg | Freq: Two times a day (BID) | INTRAMUSCULAR | Status: DC
  Administered 2019-12-03 – 2019-12-09 (×13): 60 mg via INTRAVENOUS
  Filled 2019-12-03 (×13): qty 2

## 2019-12-03 MED ORDER — SODIUM CHLORIDE 0.9 % IV SOLN
100.0000 mg | Freq: Every day | INTRAVENOUS | Status: AC
  Administered 2019-12-04 – 2019-12-07 (×4): 100 mg via INTRAVENOUS
  Filled 2019-12-03 (×4): qty 20

## 2019-12-03 MED ORDER — SODIUM CHLORIDE 0.9 % IV BOLUS
500.0000 mL | Freq: Once | INTRAVENOUS | Status: AC
  Administered 2019-12-03: 14:00:00 500 mL via INTRAVENOUS

## 2019-12-03 MED ORDER — SODIUM CHLORIDE 0.9 % IV SOLN
200.0000 mg | Freq: Once | INTRAVENOUS | Status: AC
  Administered 2019-12-03: 11:00:00 200 mg via INTRAVENOUS
  Filled 2019-12-03: qty 200

## 2019-12-03 MED ORDER — DEXTROSE 5 % IV SOLN: INTRAVENOUS | Status: DC

## 2019-12-03 MED ORDER — ORAL CARE MOUTH RINSE
15.0000 mL | Freq: Two times a day (BID) | OROMUCOSAL | Status: DC
  Administered 2019-12-03 – 2019-12-13 (×22): 15 mL via OROMUCOSAL

## 2019-12-03 MED ORDER — SODIUM CHLORIDE 0.45 % IV BOLUS
500.0000 mL | Freq: Once | INTRAVENOUS | Status: AC
  Administered 2019-12-03: 500 mL via INTRAVENOUS

## 2019-12-03 MED ORDER — ACETAMINOPHEN 650 MG RE SUPP
650.0000 mg | RECTAL | Status: DC | PRN
  Administered 2019-12-03: 07:00:00 650 mg via RECTAL
  Filled 2019-12-03: qty 1

## 2019-12-03 MED ORDER — ACETAMINOPHEN 325 MG PO TABS
650.0000 mg | ORAL_TABLET | Freq: Four times a day (QID) | ORAL | Status: DC | PRN
  Administered 2019-12-12: 650 mg via ORAL
  Filled 2019-12-03: qty 2

## 2019-12-03 NOTE — Progress Notes (Signed)
Spoke with patient's son Shirley [name].  He would like the MD to contact him.  He has a question regarding a medication that he could not remember at the time but would like to know if his mother will be able to get this med.  He will have information when MD call tomorrow.  Shirley [name] can be reached at [phone].

## 2019-12-03 NOTE — Progress Notes (Signed)
PROGRESS NOTE    Shirley [name]  WUJ:[id number] DOB: 1934/03/11 DOA: 12/02/2019 PCP: Roderick Pee, MD (Inactive)   Brief Narrative: HPI per Dr. Barbarann Ehlers is a 85 y.o. female with medical history significant of dementia; HTN; PMR; and recent hospitalization presenting with code sepsis.  She was previously hospitalized from 12/19-24 with acute metabolic encephalopathy and fever of uncertain source.  MOST form was completed with DNR/DNI; limited additional interventions including rehospitalization, limited abx, IVF, and time trial feeding tube after discussion with family.  She had poor PO intake, tachycardia, tachypnea at her facility with O2 sats 80s, improved to 96% with 3L [place] O2.  GCS 9 in the field, improved with IVF bolus.  The facility told her son the HR was increased, and this was the same reason she was here last time.  Family has not seen her in a long time.  Her son had a double lung transplant on Feb 9, last saw her Feb 8 prior to last hospitalization.  He recognizes that her dementia has gotten worse over the last 10 months.  At the time of last d/c, she was talking a lot but all over the place, alert.   She also slept a lot while she was here last time.   ED Course:  Recently admitted.  Called code stroke, not code stroke.  Dental infection, given Augmentin and sent back.  Sats 80, HR 120, appears to have PNA.  Previously with palliative care consult, limited care.  Temp 100.3.  Assessment & Plan:   Principal Problem:   Sepsis due to pneumonia [facility]) Active Problems:   Essential hypertension   Polymyalgia rheumatica (HCC)   Moderate dementia (HCC)   Acute encephalopathy   Goals of care, counseling/discussion   DNR (do not resuscitate)  1 sepsis secondary to probable Covid pneumonia/pneumonia with acute metabolic encephalopathy Patient with recent hospitalization discharged on 11/28/2019 and had presented with fever and thought to be related to a dental  infection and discharged back to assisted living facility with Augmentin.  Patient presents back with decreased oral intake, tachycardia, tachypnea noted to be hypoxic which improved on O2.  Patient admission was minimally responsive and concerned that patient was close to end-of-life.  Patient noted on admission to be septic with acute organ failure with elevated lactic acid level, encephalopathy, fever, tachycardia and borderline blood pressure. On assessment today patient opens eyes to verbal stimuli, alert, some incomprehensible speech however following commands somewhat appropriately. Etiology felt to be likely secondary to a Covid pneumonia.  Patient pancultured results pending.  Continue to get serial lactic acid level.  Check inflammatory markers.  Continue empiric vancomycin and IV cefepime.  COVID-19 PCR positive and as such we will start patient on IV remdesivir and IV Solu-Medrol.  We will give a bolus of normal saline 500 cc x 2.  Continue D5W.  Follow.  2.  Acute metabolic encephalopathy Likely secondary to problem #1 in the setting of underlying dementia and dehydration.  Patient more alert today however pleasantly confused and following commands.  Continue empiric IV antibiotics, IV remdesivir, IV Solu-Medrol, hydration, supportive care.  3.  Moderate dementia Likely resume Aricept tomorrow.  Continue to hold Celexa for now.  4.  Hypertension Blood pressure borderline.  We will give a bolus of IV fluids and normal saline.  Continue to hold Toprol-XL.  Follow.  5.  PMR Patient currently on IV steroids secondary to problem #1.  Outpatient follow-up.  6.  Hypernatremia Likely secondary  to dehydration and problem #1.  Place on D5W.  Follow.  7.  Goals of care/DNR Patient noted to have a poor overall prognosis and palliative care consulted during last hospitalization.  Patient made a DNR and MOST form was completed at that time.  Admitting physician discussed patient's overall  condition with his son and offered end-of-life measures however preferred patient be placed on IV fluids, IV antibiotics and treatment for the next 48 to 72 hours to see if there is improvement.  Case again discussed with son about patient's poor overall prognosis the decision was made to treat for the next 2 to 3 days to see if there is any clinical improvement and if no clinical improvement and continued deterioration may consider transitioning to comfort measures.  Palliative care consultation pending.   DVT prophylaxis: Lovenox Code Status: DNR Family Communication: Updated son via telephone. Disposition Plan: Transfer to [facility].   Consultants:   Palliative care pending  Procedures:   Chest x-ray 12/02/2019    Antimicrobials:   IV cefepime 11/24/2019  IV vancomycin 12/02/2019   Subjective: Patient alert.  Pleasantly confused.  Denies any chest pain.  Some complaints of shortness of breath.  No abdominal pain.  Objective: Vitals:   12/03/19 0930 12/03/19 1000 12/03/19 1100 12/03/19 1200  BP: 97/63 115/68 129/69 121/70  Pulse:      Resp: (!) 34 (!) 36 (!) 30 (!) 30  Temp:  98.9 F (37.2 C)    TempSrc:  Axillary    SpO2: 92% 91% 91%   Weight:      Height:        Intake/Output Summary (Last 24 hours) at 12/03/2019 1224 Last data filed at 12/03/2019 0433 Gross per 24 hour  Intake 2700 ml  Output 300 ml  Net 2400 ml   Filed Weights   12/02/19 1500  Weight: 59.5 kg    Examination:  General exam: NAD.  Pleasantly confused. Respiratory system: CTAB anterior lung fields.  No wheezes, no crackles, no rhonchi.  Cardiovascular system: Regular rate and rhythm no murmurs rubs or gallops.  No JVD.  No lower extremity edema.  Gastrointestinal system: Abdomen is soft, nontender, nondistended, positive bowel sounds.  No rebound.  No guarding.   Central nervous system: Alert and oriented. No focal neurological deficits. Extremities: Symmetric 5 x 5 power. Skin: No  rashes, lesions or ulcers Psychiatry: Judgement and insight appear normal. Mood & affect appropriate.     Data Reviewed: I have personally reviewed following labs and imaging studies  CBC: Recent Labs  Lab 11/27/19 0235 12/02/19 1422 12/03/19 0044  WBC 4.5 10.9* 13.7*  NEUTROABS 2.9 9.6* 11.6*  HGB 16.8* 17.6* 17.3*  HCT 50.5* 54.2* 56.2*  MCV 90.5 92.0 97.2  PLT 155 132* 137*   Basic Metabolic Panel: Recent Labs  Lab 11/27/19 0235 12/02/19 1422 12/03/19 0044  NA 138 143 147*  K 3.5 4.2 4.6  CL 102 108 113*  CO2 25 24 18*  GLUCOSE 126* 147* 105*  BUN 29* 43* 41*  CREATININE 0.87 1.23* 1.06*  CALCIUM 8.9 8.2* 8.1*  MG 2.2  --   --    GFR: Estimated Creatinine Clearance: 36.2 mL/min (A) (by C-G formula based on SCr of 1.06 mg/dL (H)). Liver Function Tests: Recent Labs  Lab 12/02/19 1422  AST 31  ALT 24  ALKPHOS 49  BILITOT 0.4  PROT 6.2*  ALBUMIN 2.6*   No results for input(s): LIPASE, AMYLASE in the last 168 hours. No results for  input(s): AMMONIA in the last 168 hours. Coagulation Profile: Recent Labs  Lab 12/03/19 1001  INR 1.3*   Cardiac Enzymes: No results for input(s): CKTOTAL, CKMB, CKMBINDEX, TROPONINI in the last 168 hours. BNP (last 3 results) No results for input(s): PROBNP in the last 8760 hours. HbA1C: No results for input(s): HGBA1C in the last 72 hours. CBG: Recent Labs  Lab 11/28/19 0102 11/28/19 0631 11/28/19 0828  GLUCAP 110* 109* 96   Lipid Profile: No results for input(s): CHOL, HDL, LDLCALC, TRIG, CHOLHDL, LDLDIRECT in the last 72 hours. Thyroid Function Tests: No results for input(s): TSH, T4TOTAL, FREET4, T3FREE, THYROIDAB in the last 72 hours. Anemia Panel: No results for input(s): VITAMINB12, FOLATE, FERRITIN, TIBC, IRON, RETICCTPCT in the last 72 hours. Sepsis Labs: Recent Labs  Lab 12/02/19 1529 12/03/19 0008 12/03/19 0635 12/03/19 1001  PROCALCITON  --   --  0.21  --   LATICACIDVEN 2.1* 5.5*  --  1.7     Recent Results (from the past 240 hour(s))  SARS CORONAVIRUS 2 (TAT 6-24 HRS) Nasopharyngeal Nasopharyngeal Swab     Status: None   Collection Time: 11/23/19  9:38 PM   Specimen: Nasopharyngeal Swab  Result Value Ref Range Status   SARS Coronavirus 2 NEGATIVE NEGATIVE Final    Comment: (NOTE) SARS-CoV-2 target nucleic acids are NOT DETECTED. The SARS-CoV-2 RNA is generally detectable in upper and lower respiratory specimens during the acute phase of infection. Negative results do not preclude SARS-CoV-2 infection, do not rule out co-infections with other pathogens, and should not be used as the sole basis for treatment or other patient management decisions. Negative results must be combined with clinical observations, patient history, and epidemiological information. The expected result is Negative. Fact Sheet for Patients: HairSlick.nohttps://www.fda.gov/media/138098/download Fact Sheet for Healthcare Providers: quierodirigir.comhttps://www.fda.gov/media/138095/download This test is not yet approved or cleared by the [place] FDA and  has been authorized for detection and/or diagnosis of SARS-CoV-2 by FDA under an Emergency Use Authorization (EUA). This EUA will remain  in effect (meaning this test can be used) for the duration of the COVID-19 declaration under Section 56 4(b)(1) of the Act, 21 U.S.C. section 360bbb-3(b)(1), unless the authorization is terminated or revoked sooner. Performed at [facility], [address] [place], [place] [address]   Culture, blood (routine x 2)     Status: None   Collection Time: 11/24/19  4:05 PM   Specimen: BLOOD LEFT HAND  Result Value Ref Range Status   Specimen Description BLOOD LEFT HAND  Final   Special Requests   Final    BOTTLES DRAWN AEROBIC AND ANAEROBIC Blood Culture adequate volume   Culture   Final    NO GROWTH 5 DAYS Performed at [facility], [address] [place], [place] [address]    Report Status 11/29/2019 FINAL  Final   Culture, blood (routine x 2)     Status: None   Collection Time: 11/24/19  4:15 PM   Specimen: BLOOD RIGHT HAND  Result Value Ref Range Status   Specimen Description BLOOD RIGHT HAND  Final   Special Requests   Final    BOTTLES DRAWN AEROBIC ONLY Blood Culture results may not be optimal due to an inadequate volume of blood received in culture bottles   Culture   Final    NO GROWTH 5 DAYS Performed at [facility], [address] [place], [place] [address]    Report Status 11/29/2019 FINAL  Final  SARS CORONAVIRUS 2 (  TAT 6-24 HRS) Nasopharyngeal Nasopharyngeal Swab     Status: None   Collection Time: 11/27/19  2:00 PM   Specimen: Nasopharyngeal Swab  Result Value Ref Range Status   SARS Coronavirus 2 NEGATIVE NEGATIVE Final    Comment: (NOTE) SARS-CoV-2 target nucleic acids are NOT DETECTED. The SARS-CoV-2 RNA is generally detectable in upper and lower respiratory specimens during the acute phase of infection. Negative results do not preclude SARS-CoV-2 infection, do not rule out co-infections with other pathogens, and should not be used as the sole basis for treatment or other patient management decisions. Negative results must be combined with clinical observations, patient history, and epidemiological information. The expected result is Negative. Fact Sheet for Patients: HairSlick.no Fact Sheet for Healthcare Providers: quierodirigir.com This test is not yet approved or cleared by the [place] FDA and  has been authorized for detection and/or diagnosis of SARS-CoV-2 by FDA under an Emergency Use Authorization (EUA). This EUA will remain  in effect (meaning this test can be used) for the duration of the COVID-19 declaration under Section 56 4(b)(1) of the Act, 21 U.S.C. section 360bbb-3(b)(1), unless the authorization is terminated or revoked sooner. Performed at [facility], [address],  [place], [place] [address]   Culture, blood (routine x 2)     Status: None (Preliminary result)   Collection Time: 12/02/19  2:16 PM   Specimen: BLOOD  Result Value Ref Range Status   Specimen Description BLOOD LEFT ANTECUBITAL  Final   Special Requests   Final    BOTTLES DRAWN AEROBIC AND ANAEROBIC Blood Culture results may not be optimal due to an inadequate volume of blood received in culture bottles   Culture   Final    NO GROWTH < 24 HOURS Performed at [facility], [address], [place], [place] [address]    Report Status PENDING  Incomplete  Culture, blood (routine x 2)     Status: None (Preliminary result)   Collection Time: 12/02/19  2:21 PM   Specimen: BLOOD  Result Value Ref Range Status   Specimen Description BLOOD BLOOD RIGHT WRIST  Final   Special Requests   Final    BOTTLES DRAWN AEROBIC AND ANAEROBIC Blood Culture results may not be optimal due to an inadequate volume of blood received in culture bottles   Culture   Final    NO GROWTH < 24 HOURS Performed at [facility], [address] [place], [place] [address]    Report Status PENDING  Incomplete  Respiratory Panel by RT PCR (Flu A&B, Covid) - Nasopharyngeal Swab     Status: Abnormal   Collection Time: 12/02/19  7:05 PM   Specimen: Nasopharyngeal Swab  Result Value Ref Range Status   SARS Coronavirus 2 by RT PCR POSITIVE (A) NEGATIVE Final    Comment: RESULT CALLED TO, READ BACK BY AND VERIFIED WITH: Colbert Coyer RN 12/02/19 2024 JDW (NOTE) SARS-CoV-2 target nucleic acids are DETECTED. SARS-CoV-2 RNA is generally detectable in upper respiratory specimens  during the acute phase of infection. Positive results are indicative of the presence of the identified virus, but do not rule out bacterial infection or co-infection with other pathogens not detected by the test. Clinical correlation with patient history and other diagnostic information is necessary to determine patient infection status. The  expected result is Negative. Fact Sheet for Patients:  https://www.moore.com/ Fact Sheet for Healthcare Providers: https://www.young.biz/ This test is not yet approved or cleared by the [place] FDA  and  has been authorized for detection and/or diagnosis of SARS-CoV-2 by FDA under an Emergency Use Authorization (EUA).  This EUA will remain in effect (meaning this test can be used) for t he duration of  the COVID-19 declaration under Section 564(b)(1) of the Act, 21 U.S.C. section 360bbb-3(b)(1), unless the authorization is terminated or revoked sooner.    Influenza A by PCR NEGATIVE NEGATIVE Final   Influenza B by PCR NEGATIVE NEGATIVE Final    Comment: (NOTE) The Xpert Xpress SARS-CoV-2/FLU/RSV assay is intended as an aid in  the diagnosis of influenza from Nasopharyngeal swab specimens and  should not be used as a sole basis for treatment. Nasal washings and  aspirates are unacceptable for Xpert Xpress SARS-CoV-2/FLU/RSV  testing. Fact Sheet for Patients: PinkCheek.be Fact Sheet for Healthcare Providers: GravelBags.it This test is not yet approved or cleared by the [place] FDA and  has been authorized for detection and/or diagnosis of SARS-CoV-2 by  FDA under an Emergency Use Authorization (EUA). This EUA will remain  in effect (meaning this test can be used) for the duration of the  Covid-19 declaration under Section 564(b)(1) of the Act, 21  U.S.C. section 360bbb-3(b)(1), unless the authorization is  terminated or revoked. Performed at [facility], [place] [address], [place], [place] [address]          Radiology Studies: DG Chest Portable 1 View  Result Date: 12/02/2019 CLINICAL DATA:  Hypoxia EXAM: PORTABLE CHEST 1 VIEW COMPARISON:  November 23, 2019 FINDINGS: There is somewhat ill-defined airspace opacity in the right lower lung region. There is mild  atelectatic change in the right mid and lower lung regions as well as in the left mid lung and left base regions without frank consolidation. Heart is upper normal in size with pulmonary vascularity normal. No adenopathy. There is a fairly sizable hiatal hernia. There is aortic atherosclerosis. No adenopathy. There is a total shoulder replacement on the right. IMPRESSION: Ill-defined airspace opacity on the right in the right lower lung region, consistent with developing pneumonia. Areas of mild atelectatic change in each mid and lower lung zone region Heart size within normal limits. Aortic Atherosclerosis (ICD10-I70.0). Hiatal hernia present. Electronically Signed   By: Lowella Grip III M.D.   On: 12/02/2019 14:42        Scheduled Meds: . enoxaparin (LOVENOX) injection  40 mg Subcutaneous Q24H  . mouth rinse  15 mL Mouth Rinse BID  . methylPREDNISolone (SOLU-MEDROL) injection  60 mg Intravenous Q12H   Continuous Infusions: . ceFEPime (MAXIPIME) IV 2 g (12/03/19 0433)  . dextrose 100 mL/hr at 12/03/19 0956  . [START ON 12/04/2019] remdesivir 100 mg in NS 100 mL    . [START ON 12/04/2019] vancomycin       LOS: 1 day    Time spent: 45 minutes    Irine Seal, MD [facility]  If 7PM-7AM, please contact night-coverage [email or web address]  12/03/2019, 12:24 PM

## 2019-12-03 NOTE — Progress Notes (Signed)
MEWS Guidelines - (patients age 83 and over)  Red - At High Risk for Deterioration Yellow - At risk for Deterioration  1. Go to room and assess patient 2. Validate data. Is this patient's baseline? If data confirmed: 3. Is this an acute change? 4. Administer prn meds/treatments as ordered. 5. Note Sepsis score 6. Review goals of care 7. Sports coach, RRT nurse and Provider. 8. Ask Provider to come to bedside.  9. Document patient condition/interventions/response. 10. Increase frequency of vital signs and focused assessments to at least q15 minutes x 4, then q30 minutes x2. - If stable, then q1h x3, then q4h x3 and then q8h or dept. routine. - If unstable, contact Provider & RRT nurse. Prepare for possible transfer. 11. Add entry in progress notes using the smart phrase ".MEWS". 1. Go to room and assess patient 2. Validate data. Is this patient's baseline? If data confirmed: 3. Is this an acute change? 4. Administer prn meds/treatments as ordered? 5. Note Sepsis score 6. Review goals of care 7. Sports coach and Provider 8. Call RRT nurse as needed. 9. Document patient condition/interventions/response. 10. Increase frequency of vital signs and focused assessments to at least q2h x2. - If stable, then q4h x2 and then q8h or dept. routine. - If unstable, contact Provider & RRT nurse. Prepare for possible transfer. 11. Add entry in progress notes using the smart phrase ".MEWS".  Green - Likely stable Lavender - Comfort Care Only  1. Continue routine/ordered monitoring.  2. Review goals of care. 1. Continue routine/ordered monitoring. 2. Review goals of care.   0612 vitals T101.2 axillary, HR 115 R 42 BP 159/92 O2 92% 3.5 L Sent Amion message to on call MD, awaiting response. Contacted Mindy with [facility]. Patient is currently resting comfortably.  No visible distress.

## 2019-12-03 NOTE — Significant Event (Signed)
[facility] Event Note  Overview: Called d/t MEWS-6 for T-101.2, HR-115, RR-42. Per RN pt is in no distress and is sleeping. RN notified NP on call for [facility]. Tylenol and 500cc NS bolus ordered. RN to call RRT if assistance needed.      Shirley [name]

## 2019-12-03 NOTE — Progress Notes (Signed)
Notified pt's son Sherren Mocha to inform him to pt left with [facility] to go to [name]. Pt's room number at [facility] is 9145-01. Todd verbalized understanding. Ranelle Oyster, RN

## 2019-12-03 NOTE — Progress Notes (Signed)
Called report to Wille Glaser, RN at [facility]. Pt is transferring to 9145-01 at [facility]. Awaiting on transport from [place]. Will continue to monitor. Ranelle Oyster, RN

## 2019-12-03 NOTE — Progress Notes (Signed)
Todd, pt's son called for update on pt. Informed son that report had been called to [facility], awaiting for [facility] to pick up pt for transport tonight. Pt's son verbalized understanding. Will continue to monitor pt. Ranelle Oyster, RN

## 2019-12-03 NOTE — Progress Notes (Signed)
Pt transferred via [facility] to [facility] with belongings. Will continue to monitor pt. Ranelle Oyster, RN

## 2019-12-04 ENCOUNTER — Other Ambulatory Visit: Payer: Self-pay

## 2019-12-04 ENCOUNTER — Encounter (HOSPITAL_COMMUNITY): Payer: Self-pay | Admitting: Internal Medicine

## 2019-12-04 LAB — COMPREHENSIVE METABOLIC PANEL
ALT: 17 U/L (ref 0–44)
AST: 34 U/L (ref 15–41)
Albumin: 2.2 g/dL — ABNORMAL LOW (ref 3.5–5.0)
Alkaline Phosphatase: 36 U/L — ABNORMAL LOW (ref 38–126)
Anion gap: 10 (ref 5–15)
BUN: 33 mg/dL — ABNORMAL HIGH (ref 8–23)
CO2: 21 mmol/L — ABNORMAL LOW (ref 22–32)
Calcium: 7.8 mg/dL — ABNORMAL LOW (ref 8.9–10.3)
Chloride: 110 mmol/L (ref 98–111)
Creatinine, Ser: 0.61 mg/dL (ref 0.44–1.00)
GFR calc Af Amer: >60 mL/min (ref >60)
GFR calc non Af Amer: >60 mL/min (ref >60)
Glucose, Bld: 196 mg/dL — ABNORMAL HIGH (ref 70–99)
Potassium: 3.7 mmol/L (ref 3.5–5.1)
Sodium: 141 mmol/L (ref 135–145)
Total Bilirubin: 0.7 mg/dL (ref 0.3–1.2)
Total Protein: 5.3 g/dL — ABNORMAL LOW (ref 6.5–8.1)

## 2019-12-04 LAB — CBC
HCT: 45.7 % (ref 36.0–46.0)
Hemoglobin: 14.5 g/dL (ref 12.0–15.0)
MCH: 29.6 pg (ref 26.0–34.0)
MCHC: 31.7 g/dL (ref 30.0–36.0)
MCV: 93.3 fL (ref 80.0–100.0)
Platelets: 156 10*3/uL (ref 150–400)
RBC: 4.9 MIL/uL (ref 3.87–5.11)
RDW: 14.3 % (ref 11.5–15.5)
WBC: 7.5 10*3/uL (ref 4.0–10.5)
nRBC: 0 % (ref 0.0–0.2)

## 2019-12-04 LAB — GLUCOSE, CAPILLARY: Glucose-Capillary: 140 mg/dL — ABNORMAL HIGH (ref 70–99)

## 2019-12-04 LAB — C-REACTIVE PROTEIN: CRP: 13.3 mg/dL — ABNORMAL HIGH (ref <1.0)

## 2019-12-04 LAB — FERRITIN: Ferritin: 1139 ng/mL — ABNORMAL HIGH (ref 11–307)

## 2019-12-04 LAB — D-DIMER, QUANTITATIVE: D-Dimer, Quant: 3.01 ug/mL-FEU — ABNORMAL HIGH (ref 0.00–0.50)

## 2019-12-04 MED ORDER — VITAMIN B-12 1000 MCG PO TABS
2000.0000 ug | ORAL_TABLET | Freq: Every day | ORAL | Status: DC
  Administered 2019-12-04 – 2019-12-13 (×10): 2000 ug via ORAL
  Filled 2019-12-04 (×10): qty 2

## 2019-12-04 MED ORDER — DONEPEZIL HCL 10 MG PO TABS
10.0000 mg | ORAL_TABLET | Freq: Every day | ORAL | Status: DC
  Administered 2019-12-04 – 2019-12-12 (×9): 10 mg via ORAL
  Filled 2019-12-04 (×9): qty 1

## 2019-12-04 NOTE — Plan of Care (Signed)

## 2019-12-04 NOTE — Progress Notes (Signed)
Pt is confused to self, time, place and situation. Pt unable to answer admission questions at this time. Will continue to monitor.

## 2019-12-04 NOTE — Progress Notes (Signed)
PMT consult received and chart reviewed. Discussed with Dr. Florene Glen and RN. Patient with clinical improvement today. More awake, alert (baseline pleasant confusion with underlying dementia), and accepting bites and sips. She is now on 3L [place]. Plan is to continue current plan of care and medical management.   Patient known to this PMT provider from last admission. Initial consult completed on 12/23. MOST form/durable DNR completed and scanned into EMR.   Spoke with son, Sherren Mocha via telephone to provide update on his mother's condition. He confirms decision for DNR/DNI code status and agrees with continuing current plan of care. He is glad to hear she has made small improvements in cognitive and nutritional status today. Reassured son of ongoing support from PMT.   PMT will follow. Thank you.    NO CHARGE  Ihor Dow, [place], FNP-C Palliative Medicine Team  Phone: [phone] Fax: [phone]

## 2019-12-04 NOTE — Progress Notes (Signed)
Pt bladder scanned with 240 ml marked on bladder scanner. Pt denies urgency. Will continue to monitor.

## 2019-12-04 NOTE — Progress Notes (Addendum)
PROGRESS NOTE    Shirley [name]  LKJ:[id number] DOB: [birth year] DOA: 12/02/2019 PCP: Dorena Cookey, MD (Inactive)   Brief Narrative: Shirley [name] 85 y.o.femalewith medical history significant ofdementia; HTN; PMR; and recent hospitalization presenting with code sepsis. She was previously hospitalized from 12/19-24 with acute metabolic encephalopathy and fever of uncertain source. MOST form was completed with DNR/DNI; limited additional interventions including rehospitalization, limited abx, IVF, and time trial feeding tube after discussion with family. She had poor PO intake, tachycardia, tachypnea at her facility with O2 sats 80s, improved to 96% with 3L [place] O2. GCS 9 in the field, improved with IVF bolus. The facility told her son the HR was increased, and this was the same reason she was here last time. Family has not seen her in [place] long time. Her son had Tajanay Hurley double lung transplant on Feb 9, last saw her Feb 8 prior to last hospitalization. He recognizes that her dementia has gotten worse over the last 10 months. At the time of last d/c, she was talking Dagon Budai lot but all over the place, alert. She alsoslept Aracelly Tencza lot while she was here last time.   ED Course:Recently admitted. Called code stroke, not code stroke. Dental infection, given Augmentin and sent back. Sats 80, HR 120, appears to have PNA. Previously with palliative care consult, limited care. Temp 100.3.  Assessment & Plan:   Principal Problem:   Healthcare-associated pneumonia Active Problems:   Essential hypertension   Polymyalgia rheumatica (HCC)   Moderate dementia ([place])   Acute encephalopathy   Goals of care, counseling/discussion   DNR (do not resuscitate)   Pneumonia due to COVID-19 virus   Hypernatremia  # Goals of care/DNR Patient noted to have Shirley [name] poor overall prognosis and palliative care consulted during last hospitalization.  Patient made Shirley [name] DNR and MOST form was completed  at that time.  Plan for this admission is to see how she does with care noted below, if she fails to improve or worsens, consider comfort care.  At this point, I think it's appropriate to continue current care.  Appreciate palliative care following.  1 Sepsis secondary to Covid Pneumonia  Possible Bacterial Pneumonia  Patient with recent hospitalization discharged on 11/28/2019 and had presented with fever and thought to be related to Akila Batta dental infection and discharged back to assisted living facility with Augmentin.   Patient presented back with decreased oral intake, tachycardia, tachypnea noted to be hypoxic which improved on O2.   Pt met criteria for sepsis on admission CXR from 12/28 with airspace opacity on R in RLL region concerning for pneumonia Urine cx no growth, blood cx pending Negative MRSA PCR, will d/c vancomycin Continue cefepime for now, deescalate as appropriate Continue remdesivir and solumedrol   2.  Acute metabolic encephalopathy Likely secondary to problem #1 in the setting of underlying dementia and dehydration.  She continues to be pleasantly confused today, Shirley [name]&Ox1.   Follow  3.  Moderate dementia resume Aricept.  Continue to hold Celexa for now.  4.  Hypertension Continue to hold metop, consider restarting tomorrow if BP stable  5.  PMR Patient currently on IV steroids secondary to problem #1.  Outpatient follow-up.  6.  Hypernatremia Likely secondary to dehydration and problem #1.  Place on D5W.  Follow.  DVT prophylaxis: lovenox Code Status: DNR Family Communication: none at bedside - discussed with husband Disposition Plan: pending  Consultants:   Palliative care  Procedures:   non3  Antimicrobials:  Anti-infectives (From admission, onward)   Start     Dose/Rate Route Frequency Ordered Stop   12/04/19 1400  vancomycin (VANCOREADY) IVPB 1250 mg/250 mL  Status:  Discontinued     1,250 mg 166.7 mL/hr over 90 Minutes Intravenous Every 48  hours 12/02/19 1555 12/04/19 1232   12/04/19 1000  remdesivir 100 mg in sodium chloride 0.9 % 100 mL IVPB     100 mg 200 mL/hr over 30 Minutes Intravenous Daily 12/03/19 0912 12/08/19 0959   12/03/19 0915  remdesivir 200 mg in sodium chloride 0.9% 250 mL IVPB     200 mg 580 mL/hr over 30 Minutes Intravenous Once 12/03/19 0912 12/03/19 1117   12/03/19 0500  ceFEPIme (MAXIPIME) 2 g in sodium chloride 0.9 % 100 mL IVPB     2 g 200 mL/hr over 30 Minutes Intravenous Every 12 hours 12/02/19 1555     12/02/19 1530  ceFEPIme (MAXIPIME) 2 g in sodium chloride 0.9 % 100 mL IVPB     2 g 200 mL/hr over 30 Minutes Intravenous  Once 12/02/19 1523 12/02/19 1758   12/02/19 1530  vancomycin (VANCOCIN) IVPB 1000 mg/200 mL premix     1,000 mg 200 mL/hr over 60 Minutes Intravenous  Once 12/02/19 1523 12/02/19 1758     Subjective: Shirley [name]&Ox1 Confused  Objective: Vitals:   12/04/19 0015 12/04/19 0442 12/04/19 0722 12/04/19 1054  BP: 114/68 103/76 132/68   Pulse: 67 70 69   Resp: _0 Temp: 98.2 F (36.8 C) (!) 96.7 F (35.9 C) (!) 97.1 F (36.2 C)   TempSrc: Oral Rectal Oral   SpO2: 91% 92% 90% 95%  Weight:      Height:        Intake/Output Summary (Last 24 hours) at 12/04/2019 1632 Last data filed at 12/04/2019 1616 Gross per 24 hour  Intake 3106.33 ml  Output 150 ml  Net 2956.33 ml   Filed Weights   12/02/19 1500  Weight: 59.5 kg    Examination:  General exam: Appears calm and comfortable  Respiratory system: Clear to auscultation. Respiratory effort normal. Cardiovascular system: RRR Gastrointestinal system: Abdomen is nondistended, soft and nontender.  Central nervous system: Alert and oriented x1. No focal neurological deficits. Extremities: no LEE Skin: No rashes, lesions or ulcers  Data Reviewed: I have personally reviewed following labs and imaging studies  CBC: Recent Labs  Lab 12/02/19 1422 12/03/19 0044 12/04/19 0825  WBC 10.9* 13.7* 7.5  NEUTROABS 9.6*  11.6*  --   HGB 17.6* 17.3* 14.5  HCT 54.2* 56.2* 45.7  MCV 92.0 97.2 93.3  PLT 132* 137* 100   Basic Metabolic Panel: Recent Labs  Lab 12/02/19 1422 12/03/19 0044 12/04/19 0825  NA 143 147* 141  K 4.2 4.6 3.7  CL 108 113* 110  CO2 24 18* 21*  GLUCOSE 147* 105* 196*  BUN 43* 41* 33*  CREATININE 1.23* 1.06* 0.61  CALCIUM 8.2* 8.1* 7.8*   GFR: Estimated Creatinine Clearance: 47.9 mL/min (by C-G formula based on SCr of 0.61 mg/dL). Liver Function Tests: Recent Labs  Lab 12/02/19 1422 12/04/19 0825  AST 31 34  ALT 24 17  ALKPHOS 49 36*  BILITOT 0.4 0.7  PROT 6.2* 5.3*  ALBUMIN 2.6* 2.2*   No results for input(s): LIPASE, AMYLASE in the last 168 hours. No results for input(s): AMMONIA in the last 168 hours. Coagulation Profile: Recent Labs  Lab 12/03/19 1001  INR 1.3*   Cardiac Enzymes: No results for  input(s): CKTOTAL, CKMB, CKMBINDEX, TROPONINI in the last 168 hours. BNP (last 3 results) No results for input(s): PROBNP in the last 8760 hours. HbA1C: No results for input(s): HGBA1C in the last 72 hours. CBG: Recent Labs  Lab 11/28/19 0102 11/28/19 0631 11/28/19 0828 12/04/19 0010  GLUCAP 110* 109* 96 140*   Lipid Profile: No results for input(s): CHOL, HDL, LDLCALC, TRIG, CHOLHDL, LDLDIRECT in the last 72 hours. Thyroid Function Tests: No results for input(s): TSH, T4TOTAL, FREET4, T3FREE, THYROIDAB in the last 72 hours. Anemia Panel: Recent Labs    12/03/19 1436 12/04/19 0825  FERRITIN 1,036* 1,139*   Sepsis Labs: Recent Labs  Lab 12/02/19 1529 12/03/19 0008 12/03/19 0635 12/03/19 1001  PROCALCITON  --   --  0.21  --   LATICACIDVEN 2.1* 5.5*  --  1.7    Recent Results (from the past 240 hour(s))  SARS CORONAVIRUS 2 (TAT 6-24 HRS) Nasopharyngeal Nasopharyngeal Swab     Status: None   Collection Time: 11/27/19  2:00 PM   Specimen: Nasopharyngeal Swab  Result Value Ref Range Status   SARS Coronavirus 2 NEGATIVE NEGATIVE Final    Comment:  (NOTE) SARS-CoV-2 target nucleic acids are NOT DETECTED. The SARS-CoV-2 RNA is generally detectable in upper and lower respiratory specimens during the acute phase of infection. Negative results do not preclude SARS-CoV-2 infection, do not rule out co-infections with other pathogens, and should not be used as the sole basis for treatment or other patient management decisions. Negative results must be combined with clinical observations, patient history, and epidemiological information. The expected result is Negative. Fact Sheet for Patients: SugarRoll.be Fact Sheet for Healthcare Providers: https://www.woods-mathews.com/ This test is not yet approved or cleared by the [place] FDA and  has been authorized for detection and/or diagnosis of SARS-CoV-2 by FDA under an Emergency Use Authorization (EUA). This EUA will remain  in effect (meaning this test can be used) for the duration of the COVID-19 declaration under Section 56 4(b)(1) of the Act, 21 U.S.C. section 360bbb-3(b)(1), unless the authorization is terminated or revoked sooner. Performed at [facility], [place] [address] [place], [place] [address]   Culture, blood (routine x 2)     Status: None (Preliminary result)   Collection Time: 12/02/19  2:16 PM   Specimen: BLOOD  Result Value Ref Range Status   Specimen Description BLOOD LEFT ANTECUBITAL  Final   Special Requests   Final    BOTTLES DRAWN AEROBIC AND ANAEROBIC Blood Culture results may not be optimal due to an inadequate volume of blood received in culture bottles   Culture   Final    NO GROWTH 2 DAYS Performed at [facility], [place] [address], [place], [place] [address]    Report Status PENDING  Incomplete  Culture, blood (routine x 2)     Status: None (Preliminary result)   Collection Time: 12/02/19  2:21 PM   Specimen: BLOOD  Result Value Ref Range Status   Specimen Description BLOOD BLOOD RIGHT WRIST  Final    Special Requests   Final    BOTTLES DRAWN AEROBIC AND ANAEROBIC Blood Culture results may not be optimal due to an inadequate volume of blood received in culture bottles   Culture   Final    NO GROWTH 2 DAYS Performed at [facility], [place] [address] [place], [place] [address]    Report Status PENDING  Incomplete  Urine culture     Status: None   Collection  Time: 12/02/19  7:05 PM   Specimen: Urine, Random  Result Value Ref Range Status   Specimen Description URINE, RANDOM  Final   Special Requests NONE  Final   Culture   Final    NO GROWTH Performed at [facility], [address] [place], [place] [address]    Report Status 12/03/2019 FINAL  Final  Respiratory Panel by RT PCR (Flu Danyale Ridinger&B, Covid) - Nasopharyngeal Swab     Status: Abnormal   Collection Time: 12/02/19  7:05 PM   Specimen: Nasopharyngeal Swab  Result Value Ref Range Status   SARS Coronavirus 2 by RT PCR POSITIVE ([place]) NEGATIVE Final    Comment: RESULT CALLED TO, READ BACK BY AND VERIFIED WITH: Pecola Lawless RN 12/02/19 2024 JDW (NOTE) SARS-CoV-2 target nucleic acids are DETECTED. SARS-CoV-2 RNA is generally detectable in upper respiratory specimens  during the acute phase of infection. Positive results are indicative of the presence of the identified virus, but do not rule out bacterial infection or co-infection with other pathogens not detected by the test. Clinical correlation with patient history and other diagnostic information is necessary to determine patient infection status. The expected result is Negative. Fact Sheet for Patients:  PinkCheek.be Fact Sheet for Healthcare Providers: GravelBags.it This test is not yet approved or cleared by the [place] FDA and  has been authorized for detection and/or diagnosis of SARS-CoV-2 by FDA under an Emergency Use Authorization (EUA).  This EUA will remain in effect (meaning this test can be  used) for t he duration of  the COVID-19 declaration under Section 564(b)(1) of the Act, 21 U.S.C. section 360bbb-3(b)(1), unless the authorization is terminated or revoked sooner.    Influenza Clorene Nerio by PCR NEGATIVE NEGATIVE Final   Influenza B by PCR NEGATIVE NEGATIVE Final    Comment: (NOTE) The Xpert Xpress SARS-CoV-2/FLU/RSV assay is intended as an aid in  the diagnosis of influenza from Nasopharyngeal swab specimens and  should not be used as Sharis Keeran sole basis for treatment. Nasal washings and  aspirates are unacceptable for Xpert Xpress SARS-CoV-2/FLU/RSV  testing. Fact Sheet for Patients: PinkCheek.be Fact Sheet for Healthcare Providers: GravelBags.it This test is not yet approved or cleared by the [place] FDA and  has been authorized for detection and/or diagnosis of SARS-CoV-2 by  FDA under an Emergency Use Authorization (EUA). This EUA will remain  in effect (meaning this test can be used) for the duration of the  Covid-19 declaration under Section 564(b)(1) of the Act, 21  U.S.C. section 360bbb-3(b)(1), unless the authorization is  terminated or revoked. Performed at [facility], [place] [address] [place], [place] [address]   MRSA PCR Screening     Status: None   Collection Time: 12/03/19  6:30 PM   Specimen: Nasopharyngeal  Result Value Ref Range Status   MRSA by PCR NEGATIVE NEGATIVE Final    Comment:        The GeneXpert MRSA Assay (FDA approved for NASAL specimens only), is one component of [facility] comprehensive MRSA colonization surveillance program. It is not intended to diagnose MRSA infection nor to guide or monitor treatment for MRSA infections. Performed at [facility], [place] [address], [place], [place] [address]          Radiology Studies: No results found.      Scheduled Meds: . enoxaparin (LOVENOX) injection  40 mg Subcutaneous Q24H  . mouth rinse  15 mL Mouth Rinse BID  .  methylPREDNISolone (SOLU-MEDROL) injection  60 mg Intravenous Q12H   Continuous Infusions: . ceFEPime (MAXIPIME) IV Stopped (12/04/19 0530)  . dextrose 100 mL/hr at 12/04/19 0732  . remdesivir 100 mg in NS 100 mL Stopped (12/04/19 1000)     LOS: 2 days    Time spent: over 30 min    Fayrene Helper, MD [facility] Pager AMION  If 7PM-7AM, please contact night-coverage [email or web address] Password [email or web address] 12/04/2019, 4:32 PM

## 2019-12-04 NOTE — Evaluation (Signed)
Clinical/Bedside Swallow Evaluation Patient Details  Name: Shirley [name] MRN: [id number] Date of Birth: 03/23/1934  Today's Date: 12/04/2019 Time: SLP Start Time (ACUTE ONLY): 1024 SLP Stop Time (ACUTE ONLY): 1037 SLP Time Calculation (min) (ACUTE ONLY): 13 min  Past Medical History:  Past Medical History:  Diagnosis Date  . Arthritis    "right shoulder; that's why I had to have it replaced"  . Hypertension   . Osteoarthritis   . Osteoporosis   . PMR (polymyalgia rheumatica) (HCC)    Past Surgical History:  Past Surgical History:  Procedure Laterality Date  . CATARACT EXTRACTION W/ INTRAOCULAR LENS  IMPLANT, BILATERAL     2008   . HIP PINNING,CANNULATED Right 10/20/2013   Procedure: CANNULATED HIP PINNING;  Surgeon: Marybelle Killings, MD;  Location: [facility];  Service: Orthopedics;  Laterality: Right;  . KYPHOPLASTY N/A 02/28/2013   Procedure: KYPHOPLASTY;  Surgeon: Ophelia Charter, MD;  Location: [facility];  Service: Neurosurgery;  Laterality: N/A;  Lumbar two Kyphoplasty  . POSTERIOR FUSION LUMBAR SPINE  03/28/12  . SHOULDER ARTHROSCOPY W/ ROTATOR CUFF REPAIR  ~ 01/2011   right  . TOTAL SHOULDER REPLACEMENT  06/2011   right  . VAGINAL HYSTERECTOMY  1970's   HPI:  85 y.o. female with medical history significant for dementia, anxiety, hypertension, and polymyalgia rheumatica. Recent hospitalization due to confusion/weakness, stroke ruled out, discharged 12/24. On 12/28 brought to ED with concern for sepsis, fouind to have pna and positive Covid test 12/28. BSE 12/22 rec'd Dys 2/thin- no s/s aspiration and cognitive based. ST discharged 12/23 continuing on Dys 2/thin. Palliative care consulted that admission with overall poor prognosis. Son desired to try antibiotics for 24-48 hours and monitor prior to comfort measures.     Assessment / Plan / Recommendation Clinical Impression  This therapist performed pt's bedside swallow on 11/27/19 at [facility] when Dys 2/thin liquids  recommended and Palliative care team following. Pt now Covid + on HFNC 7 L. Per chart, son wishes to start antibiotics and see if improvements over 24-48 hours before deciding on comfort feeds. Pt alert, talking frequently without accurate reading on O2 sats although in no observable distress. Pt may have intermittent laryngeal penetration or aspiration given dementia, respiratory needs and signs of mild throat clear following thin liquids. Orally transited thin and puree adequately. Recommending pt start on Dys 1 (puree)/thin liquids, straws allowed, pills crushed, upright position, limit distractions and full assist. Will follow briefly for efficiency with recommendations and education needed with family and discharge at that time.    SLP Visit Diagnosis: Dysphagia, unspecified (R13.10)    Aspiration Risk  Moderate aspiration risk    Diet Recommendation Dysphagia 1 (Puree);Thin liquid   Liquid Administration via: Cup;Straw Medication Administration: Crushed with puree Supervision: Staff to assist with self feeding;Full supervision/cueing for compensatory strategies Compensations: Slow rate;Small sips/bites;Lingual sweep for clearance of pocketing Postural Changes: Seated upright at 90 degrees    Other  Recommendations Oral Care Recommendations: Oral care BID   Follow up Recommendations Skilled Nursing facility      Frequency and Duration min 1 x/week  2 weeks       Prognosis Prognosis for Safe Diet Advancement: Fair Barriers to Reach Goals: Cognitive deficits      Swallow Study   General HPI: 85 y.o. female with medical history significant for dementia, anxiety, hypertension, and polymyalgia rheumatica. Recent hospitalization due to confusion/weakness, stroke ruled out, discharged 12/24. On 12/28 brought to ED with concern for sepsis,  fouind to have pna and positive Covid test 12/28. BSE 12/22 rec'd Dys 2/thin- no s/s aspiration and cognitive based. ST discharged 12/23 continuing on  Dys 2/thin. Palliative care consulted that admission with overall poor prognosis. Son desired to try antibiotics for 24-48 hours and monitor prior to comfort measures.   Type of Study: Bedside Swallow Evaluation Previous Swallow Assessment: (see HPI) Diet Prior to this Study: Thin liquids(clear liquids) Temperature Spikes Noted: Yes Respiratory Status: Other (comment)(HFNC) History of Recent Intubation: No Behavior/Cognition: Alert;Cooperative;Pleasant mood;Confused;Requires cueing Oral Cavity Assessment: Within Functional Limits Oral Care Completed by SLP: (cleaned lips) Oral Cavity - Dentition: Poor condition Vision: Functional for self-feeding Self-Feeding Abilities: Needs assist Patient Positioning: Upright in bed Baseline Vocal Quality: Normal Volitional Cough: Cognitively unable to elicit Volitional Swallow: Unable to elicit    Oral/Motor/Sensory Function Overall Oral Motor/Sensory Function: Within functional limits   Ice Chips Ice chips: Not tested   Thin Liquid Thin Liquid: Impaired Presentation: Cup Pharyngeal  Phase Impairments: Throat Clearing - Delayed;Throat Clearing - Immediate    Nectar Thick Nectar Thick Liquid: Not tested   Honey Thick Honey Thick Liquid: Not tested   Puree Puree: Impaired Pharyngeal Phase Impairments: Throat Clearing - Delayed   Solid     Solid: Not tested      Royce Macadamia 12/04/2019,11:39 AM  Breck Coons Lonell Face.Ed Nurse, children's [phone] Office [phone]

## 2019-12-05 LAB — CBC WITH DIFFERENTIAL/PLATELET
Abs Immature Granulocytes: 0.04 10*3/uL (ref 0.00–0.07)
Basophils Absolute: 0 10*3/uL (ref 0.0–0.1)
Basophils Relative: 0 %
Eosinophils Absolute: 0 10*3/uL (ref 0.0–0.5)
Eosinophils Relative: 0 %
HCT: 42.6 % (ref 36.0–46.0)
Hemoglobin: 14 g/dL (ref 12.0–15.0)
Immature Granulocytes: 1 %
Lymphocytes Relative: 10 %
Lymphs Abs: 0.8 10*3/uL (ref 0.7–4.0)
MCH: 30 pg (ref 26.0–34.0)
MCHC: 32.9 g/dL (ref 30.0–36.0)
MCV: 91.4 fL (ref 80.0–100.0)
Monocytes Absolute: 0.5 10*3/uL (ref 0.1–1.0)
Monocytes Relative: 6 %
Neutro Abs: 6.5 10*3/uL (ref 1.7–7.7)
Neutrophils Relative %: 83 %
Platelets: 157 10*3/uL (ref 150–400)
RBC: 4.66 MIL/uL (ref 3.87–5.11)
RDW: 14.1 % (ref 11.5–15.5)
WBC: 7.8 10*3/uL (ref 4.0–10.5)
nRBC: 0 % (ref 0.0–0.2)

## 2019-12-05 LAB — COMPREHENSIVE METABOLIC PANEL
ALT: 21 U/L (ref 0–44)
AST: 38 U/L (ref 15–41)
Albumin: 2.4 g/dL — ABNORMAL LOW (ref 3.5–5.0)
Alkaline Phosphatase: 39 U/L (ref 38–126)
Anion gap: 11 (ref 5–15)
BUN: 31 mg/dL — ABNORMAL HIGH (ref 8–23)
CO2: 21 mmol/L — ABNORMAL LOW (ref 22–32)
Calcium: 8.4 mg/dL — ABNORMAL LOW (ref 8.9–10.3)
Chloride: 110 mmol/L (ref 98–111)
Creatinine, Ser: 0.55 mg/dL (ref 0.44–1.00)
GFR calc Af Amer: >60 mL/min (ref >60)
GFR calc non Af Amer: >60 mL/min (ref >60)
Glucose, Bld: 138 mg/dL — ABNORMAL HIGH (ref 70–99)
Potassium: 3.5 mmol/L (ref 3.5–5.1)
Sodium: 142 mmol/L (ref 135–145)
Total Bilirubin: 0.9 mg/dL (ref 0.3–1.2)
Total Protein: 5.3 g/dL — ABNORMAL LOW (ref 6.5–8.1)

## 2019-12-05 LAB — C-REACTIVE PROTEIN: CRP: 8.7 mg/dL — ABNORMAL HIGH (ref <1.0)

## 2019-12-05 LAB — MAGNESIUM: Magnesium: 2.1 mg/dL (ref 1.7–2.4)

## 2019-12-05 LAB — FERRITIN: Ferritin: 965 ng/mL — ABNORMAL HIGH (ref 11–307)

## 2019-12-05 LAB — D-DIMER, QUANTITATIVE: D-Dimer, Quant: 2.78 ug/mL-FEU — ABNORMAL HIGH (ref 0.00–0.50)

## 2019-12-05 LAB — PHOSPHORUS: Phosphorus: 2.3 mg/dL — ABNORMAL LOW (ref 2.5–4.6)

## 2019-12-05 MED ORDER — ENSURE ENLIVE PO LIQD
237.0000 mL | Freq: Three times a day (TID) | ORAL | Status: DC
  Administered 2019-12-07 – 2019-12-13 (×17): 237 mL via ORAL

## 2019-12-05 MED ORDER — CITALOPRAM HYDROBROMIDE 10 MG PO TABS
10.0000 mg | ORAL_TABLET | Freq: Every day | ORAL | Status: DC
  Administered 2019-12-06 – 2019-12-13 (×8): 10 mg via ORAL
  Filled 2019-12-05 (×8): qty 1

## 2019-12-05 MED ORDER — METOPROLOL SUCCINATE ER 25 MG PO TB24
25.0000 mg | ORAL_TABLET | Freq: Every day | ORAL | Status: DC
  Administered 2019-12-05 – 2019-12-12 (×7): 25 mg via ORAL
  Filled 2019-12-05 (×8): qty 1

## 2019-12-05 MED ORDER — TRAZODONE HCL 50 MG PO TABS
50.0000 mg | ORAL_TABLET | Freq: Every day | ORAL | Status: DC
  Administered 2019-12-05 – 2019-12-06 (×2): 50 mg via ORAL
  Filled 2019-12-05 (×2): qty 1

## 2019-12-05 NOTE — Progress Notes (Signed)
Initial Nutrition Assessment  DOCUMENTATION CODES:   Not applicable  INTERVENTION:    Ensure Enlive po TID, each supplement provides 350 kcal and 20 grams of protein  NUTRITION DIAGNOSIS:   Increased nutrient needs related to acute illness(COVID-19) as evidenced by estimated needs.  GOAL:   Patient will meet greater than or equal to 90% of their needs  MONITOR:   PO intake, Supplement acceptance, Skin, Labs  REASON FOR ASSESSMENT:   Malnutrition Screening Tool    ASSESSMENT:   86 yo female admitted with sepsis r/t Covid PNA. PMH includes dementia, HTN, osteoporosis, polymyalgia rheumatica.    SLP following for dysphagia. Currently on a dysphagia 1 diet with thin liquids. No intakes recorded. Suspect intake has been poor given dementia and acute illness. Patient would benefit from PO supplements to maximize intake to help meet increased nutrition needs for COVID-19.  Palliative Care team is following. Patient is DNR/DNI since last admission. Plans to continue current care with transition to comfort care if she fails to improve or worsens.   Labs reviewed. Phosphorus 2.3 (L)  Medications reviewed and include solu-medrol, vitamin B-12.  NUTRITION - FOCUSED PHYSICAL EXAM:  deferred due to COVID restrictions  Diet Order:   Diet Order            DIET - DYS 1 Room service appropriate? Yes; Fluid consistency: Thin  Diet effective now              EDUCATION NEEDS:   Not appropriate for education at this time  Skin:  Skin Assessment: Reviewed RN Assessment  Last BM:  12/31 type 5  Height:   Ht Readings from Last 1 Encounters:  12/02/19 5\' 5"  (1.651 m)    Weight:   Wt Readings from Last 1 Encounters:  12/02/19 59.5 kg    Ideal Body Weight:  56.8 kg  BMI:  Body mass index is 21.83 kg/m.  Estimated Nutritional Needs:   Kcal:  1700-1900  Protein:  85-95 gm  Fluid:  >/= 1.7 L    Molli Barrows, RD, LDN, [place] Pager [phone] After Hours Pager  [phone]

## 2019-12-05 NOTE — Plan of Care (Signed)

## 2019-12-05 NOTE — Evaluation (Signed)
Physical Therapy Evaluation Patient Details Name: Shirley [name] MRN: [id number] DOB: 05-08-1934 Today's Date: 12/05/2019   History of Present Illness  85 y.o. female with medical history significant of dementia; HTN; PMR; encephalopathy, PNA sec to covid 19 and recent hospitalization presenting with code sepsis.  She was previously hospitalized from 12/19-24 with acute metabolic encephalopathy and fever of uncertain source.   Clinical Impression   Pt eval completed, therapist attempted to reach out to [facility] to discuss patient's prior level of function prior to this hospitalization but facility was unable to provide staff to give this information, based on on objective findings from assessment pt is not a good candidate for skilled acute car level PT intervention at this time. She at max total assist with all mobility and is guarding against movement heavily. Pt may return to [facility] when medically cleared where she will have staff to take care of all needs as previous. Thank you for this referral.    Follow Up Recommendations SNF    Equipment Recommendations  None recommended by PT    Recommendations for Other Services       Precautions / Restrictions Precautions Precautions: Fall Precaution Comments: cognition pt very confused Restrictions Weight Bearing Restrictions: No      Mobility  Bed Mobility Overal bed mobility: Needs Assistance Bed Mobility: Rolling;Sit to Supine;Supine to Sit Rolling: Min assist   Supine to sit: Max assist Sit to supine: Max assist      Transfers Overall transfer level: Needs assistance   Transfers: Sit to/from Stand Sit to Stand: Total assist            Ambulation/Gait             General Gait Details: unable to ambulate at this time  Stairs            Wheelchair Mobility    Modified Rankin (Stroke Patients Only)       Balance Overall balance assessment: Needs assistance Sitting-balance  support: Feet unsupported Sitting balance-Leahy Scale: Zero Sitting balance - Comments: unable to tolerate sitting without max a/ total a Postural control: Posterior lean;Right lateral lean;Left lateral lean   Standing balance-Leahy Scale: Zero                               Pertinent Vitals/Pain Pain Assessment: Faces Faces Pain Scale: Hurts even more Pain Location: seems to have pain with mobility and positioning in bed Pain Descriptors / Indicators: Grimacing;Moaning Pain Intervention(s): Limited activity within patient's tolerance;Monitored during session    Home Living Family/patient expects to be discharged to:: Skilled nursing facility                 Additional Comments: from memory care unit    Prior Function Level of Independence: Needs assistance   Gait / Transfers Assistance Needed: as per chart prior to last admission pt was ambulating with RW           Hand Dominance        Extremity/Trunk Assessment   Upper Extremity Assessment Upper Extremity Assessment: Generalized weakness    Lower Extremity Assessment Lower Extremity Assessment: Generalized weakness       Communication   Communication: Expressive difficulties;Receptive difficulties(talking a lot but mostly nonsenscically)  Cognition Arousal/Alertness: Lethargic Behavior During Therapy: Restless;Agitated;Anxious Overall Cognitive Status: History of cognitive impairments - at baseline  General Comments      Exercises     Assessment/Plan    PT Assessment All further PT needs can be met in the next venue of care  PT Problem List Decreased strength;Decreased activity tolerance;Decreased balance;Decreased mobility;Decreased coordination;Decreased cognition;Decreased knowledge of use of DME;Decreased safety awareness       PT Treatment Interventions      PT Goals (Current goals can be found in the Care Plan section)   Acute Rehab PT Goals Potential to Achieve Goals: Poor    Frequency     Barriers to discharge        Co-evaluation               AM-PAC PT "6 Clicks" Mobility  Outcome Measure Help needed turning from your back to your side while in a flat bed without using bedrails?: A Lot Help needed moving from lying on your back to sitting on the side of a flat bed without using bedrails?: A Lot Help needed moving to and from a bed to a chair (including a wheelchair)?: Total Help needed standing up from a chair using your arms (e.g., wheelchair or bedside chair)?: Total Help needed to walk in hospital room?: Total Help needed climbing 3-5 steps with a railing? : Total 6 Click Score: 8    End of Session Equipment Utilized During Treatment: Oxygen Activity Tolerance: Treatment limited secondary to medical complications (Comment);Treatment limited secondary to agitation Patient left: in bed;with call bell/phone within reach;with bed alarm set Nurse Communication: Mobility status PT Visit Diagnosis: Other abnormalities of gait and mobility (R26.89);Muscle weakness (generalized) (M62.81)    Time: 9200-4159 PT Time Calculation (min) (ACUTE ONLY): 18 min   Charges:   PT Evaluation $PT Eval Moderate Complexity: Domino, PT   Delford Field 12/05/2019, 4:16 PM

## 2019-12-05 NOTE — Discharge Instructions (Signed)
Community-Acquired Pneumonia, Adult Pneumonia is an infection of the lungs. It causes swelling in the airways of the lungs. Mucus and fluid may also build up inside the airways. One type of pneumonia can happen while a person is in a hospital. A different type can happen when a person is not in a hospital (community-acquired pneumonia).  What are the causes?  This condition is caused by germs (viruses, bacteria, or fungi). Some types of germs can be passed from one person to another. This can happen when you breathe in droplets from the cough or sneeze of an infected person. What increases the risk? You are more likely to develop this condition if you:  Have a long-term (chronic) disease, such as: ? Chronic obstructive pulmonary disease (COPD). ? Asthma. ? Cystic fibrosis. ? Congestive heart failure. ? Diabetes. ? Kidney disease.  Have HIV.  Have sickle cell disease.  Have had your spleen removed.  Do not take good care of your teeth and mouth (poor dental hygiene).  Have a medical condition that increases the risk of breathing in droplets from your own mouth and nose.  Have a weakened body defense system (immune system).  Are a smoker.  Travel to areas where the germs that cause this illness are common.  Are around certain animals or the places they live. What are the signs or symptoms?  A dry cough.  A wet (productive) cough.  Fever.  Sweating.  Chest pain. This often happens when breathing deeply or coughing.  Fast breathing or trouble breathing.  Shortness of breath.  Shaking chills.  Feeling tired (fatigue).  Muscle aches. How is this treated? Treatment for this condition depends on many things. Most adults can be treated at home. In some cases, treatment must happen in a hospital. Treatment may include:  Medicines given by mouth or through an IV tube.  Being given extra oxygen.  Respiratory therapy. In rare cases, treatment for very bad pneumonia  may include:  Using a machine to help you breathe.  Having a procedure to remove fluid from around your lungs. Follow these instructions at home: Medicines  Take over-the-counter and prescription medicines only as told by your doctor. ? Only take cough medicine if you are losing sleep.  If you were prescribed an antibiotic medicine, take it as told by your doctor. Do not stop taking the antibiotic even if you start to feel better. General instructions   Sleep with your head and neck raised (elevated). You can do this by sleeping in a recliner or by putting a few pillows under your head.  Rest as needed. Get at least 8 hours of sleep each night.  Drink enough water to keep your pee (urine) pale yellow.  Eat a healthy diet that includes plenty of vegetables, fruits, whole grains, low-fat dairy products, and lean protein.  Do not use any products that contain nicotine or tobacco. These include cigarettes, e-cigarettes, and chewing tobacco. If you need help quitting, ask your doctor.  Keep all follow-up visits as told by your doctor. This is important. How is this prevented? A shot (vaccine) can help prevent pneumonia. Shots are often suggested for:  People older than 83 years of age.  People older than 83 years of age who: ? Are having cancer treatment. ? Have long-term (chronic) lung disease. ? Have problems with their body's defense system. You may also prevent pneumonia if you take these actions:  Get the flu (influenza) shot every year.  Go to the dentist as   often as told.  Wash your hands often. If you cannot use soap and water, use hand sanitizer. Contact a doctor if:  You have a fever.  You lose sleep because your cough medicine does not help. Get help right away if:  You are short of breath and it gets worse.  You have more chest pain.  Your sickness gets worse. This is very serious if: ? You are an older adult. ? Your body's defense system is weak.  You  cough up blood. Summary  Pneumonia is an infection of the lungs.  Most adults can be treated at home. Some will need treatment in a hospital.  Drink enough water to keep your pee pale yellow.  Get at least 8 hours of sleep each night. This information is not intended to replace advice given to you by your health care provider. Make sure you discuss any questions you have with your health care provider. Document Revised: 03/13/2019 Document Reviewed: 07/19/2018 Elsevier Patient Education  2020 Elsevier Inc.  

## 2019-12-05 NOTE — Progress Notes (Addendum)
PROGRESS NOTE    Shirley [name]  WUJ:[id number] DOB: 06/07/1934 DOA: 12/02/2019 PCP: Dorena Cookey, MD (Inactive)   Brief Narrative: HPI per Dr. Doretha Imus Shirley [name] 85 y.o.femalewith medical history significant ofdementia; HTN; PMR; and recent hospitalization presenting with code sepsis. She was previously hospitalized from 12/19-24 with acute metabolic encephalopathy and fever of uncertain source. MOST form was completed with DNR/DNI; limited additional interventions including rehospitalization, limited abx, IVF, and time trial feeding tube after discussion with family. She had poor PO intake, tachycardia, tachypnea at her facility with O2 sats 80s, improved to 96% with 3L [place] O2. GCS 9 in the field, improved with IVF bolus. The facility told her son the HR was increased, and this was the same reason she was here last time. Family has not seen her in Shirley [name] long time. Her son had Shirley [name] double lung transplant on Feb 9, last saw her Feb 8 prior to last hospitalization. He recognizes that her dementia has gotten worse over the last 10 months. At the time of last d/c, she was talking Shirley [name] lot but all over the place, alert. She alsoslept Shirley [name] lot while she was here last time.   ED Course:Recently admitted. Called code stroke, not code stroke. Dental infection, given Augmentin and sent back. Sats 80, HR 120, appears to have PNA. Previously with palliative care consult, limited care. Temp 100.3.  Assessment & Plan:   Principal Problem:   Healthcare-associated pneumonia Active Problems:   Essential hypertension   Polymyalgia rheumatica (HCC)   Moderate dementia ([place])   Acute encephalopathy   Goals of care, counseling/discussion   DNR (do not resuscitate)   Pneumonia due to COVID-19 virus   Hypernatremia  # Goals of care/DNR Patient noted to have Shirley [name] poor overall prognosis and palliative care consulted during last hospitalization.  Patient made Talli Kimmer DNR and MOST form was completed  at that time.  Plan for this admission is to see how she does with care noted below, if she fails to improve or worsens, consider comfort care.  She continues to be confused today.  Per RN not eating Linell Shawn whole lot.  Her oxygen needs are stable at this time.  Will discuss with palliative and son.  1 Sepsis secondary to Covid Pneumonia  Possible Bacterial Pneumonia  Patient with recent hospitalization discharged on 11/28/2019 and had presented with fever and thought to be related to Shirley [name] dental infection and discharged back to assisted living facility with Augmentin.   Patient presented back with decreased oral intake, tachycardia, tachypnea noted to be hypoxic which improved on O2.   Pt met criteria for sepsis on admission CXR from 12/28 with airspace opacity on R in RLL region concerning for pneumonia Urine cx no growth, blood cx NGTD x3 Negative MRSA PCR, will d/c vancomycin Continue cefepime for now, deescalate as appropriate Continue remdesivir and solumedrol  Addendum: pt pulling off o2, restraints ordered overnight  COVID-19 Labs  Recent Labs    12/03/19 1001 12/03/19 1436 12/04/19 0825 12/05/19 0105  DDIMER 3.49*  --  3.01* 2.78*  FERRITIN  --  1,036* 1,139* 965*  CRP  --  16.2* 13.3* 8.7*    Lab Results  Component Value Date   SARSCOV2NAA POSITIVE (Shirley [name]) 12/02/2019   Shirley [name] NEGATIVE 11/27/2019   Shirley [name] NEGATIVE 11/23/2019    2.  Acute metabolic encephalopathy Likely secondary to problem #1 in the setting of underlying dementia and dehydration.  She continues to be pleasantly confused today, Shirley [name]&Ox1.   Follow  #  Superventricular Tachycardia: 2 runs 12/31 AM.  Restart metoprolol.    3.  Moderate dementia resume Aricept.  Continue to hold Celexa for now.  4.  Hypertension Metoprolol  5.  PMR Patient currently on IV steroids secondary to problem #1.  Outpatient follow-up.  6.  Hypernatremia Improved, follow off IVF  DVT prophylaxis: lovenox Code Status:  DNR Family Communication: none at bedside - discussed with husband and son 12/31 Disposition Plan: pending  Consultants:   Palliative care  Procedures:   none  Antimicrobials:  Anti-infectives (From admission, onward)   Start     Dose/Rate Route Frequency Ordered Stop   12/04/19 1400  vancomycin (VANCOREADY) IVPB 1250 mg/250 mL  Status:  Discontinued     1,250 mg 166.7 mL/hr over 90 Minutes Intravenous Every 48 hours 12/02/19 1555 12/04/19 1232   12/04/19 1000  remdesivir 100 mg in sodium chloride 0.9 % 100 mL IVPB     100 mg 200 mL/hr over 30 Minutes Intravenous Daily 12/03/19 0912 12/08/19 0959   12/03/19 0915  remdesivir 200 mg in sodium chloride 0.9% 250 mL IVPB     200 mg 580 mL/hr over 30 Minutes Intravenous Once 12/03/19 0912 12/03/19 1117   12/03/19 0500  ceFEPIme (MAXIPIME) 2 g in sodium chloride 0.9 % 100 mL IVPB     2 g 200 mL/hr over 30 Minutes Intravenous Every 12 hours 12/02/19 1555     12/02/19 1530  ceFEPIme (MAXIPIME) 2 g in sodium chloride 0.9 % 100 mL IVPB     2 g 200 mL/hr over 30 Minutes Intravenous  Once 12/02/19 1523 12/02/19 1758   12/02/19 1530  vancomycin (VANCOCIN) IVPB 1000 mg/200 mL premix     1,000 mg 200 mL/hr over 60 Minutes Intravenous  Once 12/02/19 1523 12/02/19 1758     Subjective: Confused.  Says nonsensical things, difficult to understand.  Objective: Vitals:   12/05/19 0419 12/05/19 0900 12/05/19 1109 12/05/19 1200  BP: 134/79 114/72    Pulse:  69 60 61  Resp:  (!) 22 (!) 22 20  Temp: (!) 97 F (36.1 C) 97.8 F (36.6 C)    TempSrc: Axillary     SpO2: 93% 90% 94% 93%  Weight:      Height:        Intake/Output Summary (Last 24 hours) at 12/05/2019 1358 Last data filed at 12/05/2019 0900 Gross per 24 hour  Intake 1631.6 ml  Output 600 ml  Net 1031.6 ml   Filed Weights   12/02/19 1500  Weight: 59.5 kg    Examination:  General: No acute distress. Cardiovascular: RRR Lungs: Clear to auscultation  bilaterally Abdomen: Soft, nontender, nondistended Neurological: Alert and oriented 1. Moves all extremities 4. Cranial nerves II through XII grossly intact. Skin: Warm and dry. No rashes or lesions. Extremities: No clubbing or cyanosis. No edema.   Data Reviewed: I have personally reviewed following labs and imaging studies  CBC: Recent Labs  Lab 12/02/19 1422 12/03/19 0044 12/04/19 0825 12/05/19 0105  WBC 10.9* 13.7* 7.5 7.8  NEUTROABS 9.6* 11.6*  --  6.5  HGB 17.6* 17.3* 14.5 14.0  HCT 54.2* 56.2* 45.7 42.6  MCV 92.0 97.2 93.3 91.4  PLT 132* 137* 156 496   Basic Metabolic Panel: Recent Labs  Lab 12/02/19 1422 12/03/19 0044 12/04/19 0825 12/05/19 0105  NA 143 147* 141 142  K 4.2 4.6 3.7 3.5  CL 108 113* 110 110  CO2 24 18* 21* 21*  GLUCOSE 147* 105* 196* 138*  BUN 43* 41* 33* 31*  CREATININE 1.23* 1.06* 0.61 0.55  CALCIUM 8.2* 8.1* 7.8* 8.4*  MG  --   --   --  2.1  PHOS  --   --   --  2.3*   GFR: Estimated Creatinine Clearance: 47.9 mL/min (by C-G formula based on SCr of 0.55 mg/dL). Liver Function Tests: Recent Labs  Lab 12/02/19 1422 12/04/19 0825 12/05/19 0105  AST 31 34 38  ALT '24 17 21  ' ALKPHOS 49 36* 39  BILITOT 0.4 0.7 0.9  PROT 6.2* 5.3* 5.3*  ALBUMIN 2.6* 2.2* 2.4*   No results for input(s): LIPASE, AMYLASE in the last 168 hours. No results for input(s): AMMONIA in the last 168 hours. Coagulation Profile: Recent Labs  Lab 12/03/19 1001  INR 1.3*   Cardiac Enzymes: No results for input(s): CKTOTAL, CKMB, CKMBINDEX, TROPONINI in the last 168 hours. BNP (last 3 results) No results for input(s): PROBNP in the last 8760 hours. HbA1C: No results for input(s): HGBA1C in the last 72 hours. CBG: Recent Labs  Lab 12/04/19 0010  GLUCAP 140*   Lipid Profile: No results for input(s): CHOL, HDL, LDLCALC, TRIG, CHOLHDL, LDLDIRECT in the last 72 hours. Thyroid Function Tests: No results for input(s): TSH, T4TOTAL, FREET4, T3FREE, THYROIDAB in  the last 72 hours. Anemia Panel: Recent Labs    12/04/19 0825 12/05/19 0105  FERRITIN 1,139* 965*   Sepsis Labs: Recent Labs  Lab 12/02/19 1529 12/03/19 0008 12/03/19 0635 12/03/19 1001  PROCALCITON  --   --  0.21  --   LATICACIDVEN 2.1* 5.5*  --  1.7    Recent Results (from the past 240 hour(s))  SARS CORONAVIRUS 2 (TAT 6-24 HRS) Nasopharyngeal Nasopharyngeal Swab     Status: None   Collection Time: 11/27/19  2:00 PM   Specimen: Nasopharyngeal Swab  Result Value Ref Range Status   SARS Coronavirus 2 NEGATIVE NEGATIVE Final    Comment: (NOTE) SARS-CoV-2 target nucleic acids are NOT DETECTED. The SARS-CoV-2 RNA is generally detectable in upper and lower respiratory specimens during the acute phase of infection. Negative results do not preclude SARS-CoV-2 infection, do not rule out co-infections with other pathogens, and should not be used as the sole basis for treatment or other patient management decisions. Negative results must be combined with clinical observations, patient history, and epidemiological information. The expected result is Negative. Fact Sheet for Patients: SugarRoll.be Fact Sheet for Healthcare Providers: https://www.woods-mathews.com/ This test is not yet approved or cleared by the [place] FDA and  has been authorized for detection and/or diagnosis of SARS-CoV-2 by FDA under [name] Emergency Use Authorization (EUA). This EUA will remain  in effect (meaning this test can be used) for the duration of the COVID-19 declaration under Section 56 4(b)(1) of the Act, 21 U.S.C. section 360bbb-3(b)(1), unless the authorization is terminated or revoked sooner. Performed at [facility], [place] [address] [place], [place] [address]   Culture, blood (routine x 2)     Status: None (Preliminary result)   Collection Time: 12/02/19  2:16 PM   Specimen: BLOOD  Result Value Ref Range Status   Specimen Description BLOOD  LEFT ANTECUBITAL  Final   Special Requests   Final    BOTTLES DRAWN AEROBIC AND ANAEROBIC Blood Culture results may not be optimal due to [name] inadequate volume of blood received in culture bottles   Culture   Final    NO GROWTH 3 DAYS Performed at [facility], [place]  [address] [place], [place] [address]    Report Status PENDING  Incomplete  Culture, blood (routine x 2)     Status: None (Preliminary result)   Collection Time: 12/02/19  2:21 PM   Specimen: BLOOD  Result Value Ref Range Status   Specimen Description BLOOD BLOOD RIGHT WRIST  Final   Special Requests   Final    BOTTLES DRAWN AEROBIC AND ANAEROBIC Blood Culture results may not be optimal due to [name] inadequate volume of blood received in culture bottles   Culture   Final    NO GROWTH 3 DAYS Performed at [facility], [place] [address] [place], [place] [address]    Report Status PENDING  Incomplete  Urine culture     Status: None   Collection Time: 12/02/19  7:05 PM   Specimen: Urine, Random  Result Value Ref Range Status   Specimen Description URINE, RANDOM  Final   Special Requests NONE  Final   Culture   Final    NO GROWTH Performed at [facility], [place] [address], [place], [place] [address]    Report Status 12/03/2019 FINAL  Final  Respiratory Panel by RT PCR (Flu Byan Poplaski&B, Covid) - Nasopharyngeal Swab     Status: Abnormal   Collection Time: 12/02/19  7:05 PM   Specimen: Nasopharyngeal Swab  Result Value Ref Range Status   SARS Coronavirus 2 by RT PCR POSITIVE (Daejah Klebba) NEGATIVE Final    Comment: RESULT CALLED TO, READ BACK BY AND VERIFIED WITH: Pecola Lawless RN 12/02/19 2024 JDW (NOTE) SARS-CoV-2 target nucleic acids are DETECTED. SARS-CoV-2 RNA is generally detectable in upper respiratory specimens  during the acute phase of infection. Positive results are indicative of the presence of the identified virus, but do not rule out bacterial infection or co-infection with other pathogens not detected by the test.  Clinical correlation with patient history and other diagnostic information is necessary to determine patient infection status. The expected result is Negative. Fact Sheet for Patients:  PinkCheek.be Fact Sheet for Healthcare Providers: GravelBags.it This test is not yet approved or cleared by the [place] FDA and  has been authorized for detection and/or diagnosis of SARS-CoV-2 by FDA under [name] Emergency Use Authorization (EUA).  This EUA will remain in effect (meaning this test can be used) for t he duration of  the COVID-19 declaration under Section 564(b)(1) of the Act, 21 U.S.C. section 360bbb-3(b)(1), unless the authorization is terminated or revoked sooner.    Influenza Jacqueline Spofford by PCR NEGATIVE NEGATIVE Final   Influenza B by PCR NEGATIVE NEGATIVE Final    Comment: (NOTE) The Xpert Xpress SARS-CoV-2/FLU/RSV assay is intended as [name] aid in  the diagnosis of influenza from Nasopharyngeal swab specimens and  should not be used as Alano Blasco sole basis for treatment. Nasal washings and  aspirates are unacceptable for Xpert Xpress SARS-CoV-2/FLU/RSV  testing. Fact Sheet for Patients: PinkCheek.be Fact Sheet for Healthcare Providers: GravelBags.it This test is not yet approved or cleared by the [place] FDA and  has been authorized for detection and/or diagnosis of SARS-CoV-2 by  FDA under [name] Emergency Use Authorization (EUA). This EUA will remain  in effect (meaning this test can be used) for the duration of the  Covid-19 declaration under Section 564(b)(1) of the Act, 21  U.S.C. section 360bbb-3(b)(1), unless the authorization is  terminated or revoked. Performed at [facility], [place] [address] [place], [place] [address]   MRSA PCR Screening     Status: None  Collection Time: 12/03/19  6:30 PM   Specimen: Nasopharyngeal  Result Value Ref Range Status   MRSA by  PCR NEGATIVE NEGATIVE Final    Comment:        The GeneXpert MRSA Assay (FDA approved for NASAL specimens only), is one component of Kailo Kosik comprehensive MRSA colonization surveillance program. It is not intended to diagnose MRSA infection nor to guide or monitor treatment for MRSA infections. Performed at  [facility], [place] [address] [place], [place] [address]          Radiology Studies: No results found.      Scheduled Meds: . donepezil  10 mg Oral QHS  . enoxaparin (LOVENOX) injection  40 mg Subcutaneous Q24H  . feeding supplement (ENSURE ENLIVE)  237 mL Oral TID BM  . mouth rinse  15 mL Mouth Rinse BID  . methylPREDNISolone (SOLU-MEDROL) injection  60 mg Intravenous Q12H  . vitamin B-12  2,000 mcg Oral Daily   Continuous Infusions: . ceFEPime (MAXIPIME) IV Stopped (12/05/19 0523)  . remdesivir 100 mg in NS 100 mL 100 mg (12/05/19 1035)     LOS: 3 days    Time spent: over 52 min    Fayrene Helper, MD [facility] Pager [email or web address]  If 7PM-7AM, please contact night-coverage [email or web address] Password [email or web address] 12/05/2019, 1:58 PM

## 2019-12-06 DIAGNOSIS — J1282 Pneumonia due to coronavirus disease 2019: Secondary | ICD-10-CM

## 2019-12-06 DIAGNOSIS — Z515 Encounter for palliative care: Secondary | ICD-10-CM

## 2019-12-06 DIAGNOSIS — F03918 Unspecified dementia, unspecified severity, with other behavioral disturbance: Secondary | ICD-10-CM

## 2019-12-06 DIAGNOSIS — Z7189 Other specified counseling: Secondary | ICD-10-CM

## 2019-12-06 DIAGNOSIS — F0391 Unspecified dementia with behavioral disturbance: Secondary | ICD-10-CM

## 2019-12-06 DIAGNOSIS — G9340 Encephalopathy, unspecified: Secondary | ICD-10-CM

## 2019-12-06 DIAGNOSIS — U071 COVID-19: Secondary | ICD-10-CM

## 2019-12-06 LAB — COMPREHENSIVE METABOLIC PANEL
ALT: 45 U/L — ABNORMAL HIGH (ref 0–44)
AST: 85 U/L — ABNORMAL HIGH (ref 15–41)
Albumin: 2.6 g/dL — ABNORMAL LOW (ref 3.5–5.0)
Alkaline Phosphatase: 51 U/L (ref 38–126)
Anion gap: 11 (ref 5–15)
BUN: 31 mg/dL — ABNORMAL HIGH (ref 8–23)
CO2: 26 mmol/L (ref 22–32)
Calcium: 8.5 mg/dL — ABNORMAL LOW (ref 8.9–10.3)
Chloride: 108 mmol/L (ref 98–111)
Creatinine, Ser: 0.79 mg/dL (ref 0.44–1.00)
GFR calc Af Amer: >60 mL/min (ref >60)
GFR calc non Af Amer: >60 mL/min (ref >60)
Glucose, Bld: 168 mg/dL — ABNORMAL HIGH (ref 70–99)
Potassium: 3.6 mmol/L (ref 3.5–5.1)
Sodium: 145 mmol/L (ref 135–145)
Total Bilirubin: 0.6 mg/dL (ref 0.3–1.2)
Total Protein: 6 g/dL — ABNORMAL LOW (ref 6.5–8.1)

## 2019-12-06 LAB — CBC WITH DIFFERENTIAL/PLATELET
Abs Immature Granulocytes: 0.08 10*3/uL — ABNORMAL HIGH (ref 0.00–0.07)
Basophils Absolute: 0 10*3/uL (ref 0.0–0.1)
Basophils Relative: 0 %
Eosinophils Absolute: 0 10*3/uL (ref 0.0–0.5)
Eosinophils Relative: 0 %
HCT: 46.9 % — ABNORMAL HIGH (ref 36.0–46.0)
Hemoglobin: 15.3 g/dL — ABNORMAL HIGH (ref 12.0–15.0)
Immature Granulocytes: 1 %
Lymphocytes Relative: 8 %
Lymphs Abs: 0.9 10*3/uL (ref 0.7–4.0)
MCH: 29.8 pg (ref 26.0–34.0)
MCHC: 32.6 g/dL (ref 30.0–36.0)
MCV: 91.2 fL (ref 80.0–100.0)
Monocytes Absolute: 0.7 10*3/uL (ref 0.1–1.0)
Monocytes Relative: 6 %
Neutro Abs: 10 10*3/uL — ABNORMAL HIGH (ref 1.7–7.7)
Neutrophils Relative %: 85 %
Platelets: 203 10*3/uL (ref 150–400)
RBC: 5.14 MIL/uL — ABNORMAL HIGH (ref 3.87–5.11)
RDW: 14.1 % (ref 11.5–15.5)
WBC: 11.8 10*3/uL — ABNORMAL HIGH (ref 4.0–10.5)
nRBC: 0 % (ref 0.0–0.2)

## 2019-12-06 LAB — D-DIMER, QUANTITATIVE: D-Dimer, Quant: 2.7 ug/mL-FEU — ABNORMAL HIGH (ref 0.00–0.50)

## 2019-12-06 LAB — MAGNESIUM: Magnesium: 2.1 mg/dL (ref 1.7–2.4)

## 2019-12-06 LAB — C-REACTIVE PROTEIN: CRP: 4.4 mg/dL — ABNORMAL HIGH (ref <1.0)

## 2019-12-06 LAB — FERRITIN: Ferritin: 572 ng/mL — ABNORMAL HIGH (ref 11–307)

## 2019-12-06 LAB — PHOSPHORUS: Phosphorus: 2.7 mg/dL (ref 2.5–4.6)

## 2019-12-06 MED ORDER — QUETIAPINE FUMARATE 25 MG PO TABS
12.5000 mg | ORAL_TABLET | Freq: Every day | ORAL | Status: DC
  Administered 2019-12-06 – 2019-12-09 (×4): 12.5 mg via ORAL
  Filled 2019-12-06 (×4): qty 1

## 2019-12-06 MED ORDER — QUETIAPINE FUMARATE 25 MG PO TABS: 12.5000 mg | ORAL_TABLET | Freq: Two times a day (BID) | ORAL | Status: DC

## 2019-12-06 NOTE — Progress Notes (Signed)
Patient still not eating well. Multiple staff members, including this nurse and Melissa C. RN, have offered food and fluids to patient through out the day. When offered food/fluids pt screams she doesn't want it and turns her head away from the spoon. Patient only ate the magic cup at lunch & dinner. Dr. Lowell Guitar notified.

## 2019-12-06 NOTE — Progress Notes (Signed)
Spoke to and updated son Tawanna Cooler. Reviewed patient's condition, oxygen level, plan of care and current treatment. Discussed that pt not eating, in restraints due to pt pulls off oxygen and oxygen saturations drop.

## 2019-12-06 NOTE — Progress Notes (Signed)
Spoke to and updated son Tawanna Cooler. Reviewed patient's condition, oxygen level, plan of care and current treatment. Discussed that pt still not eating, still in restraints, pt hitting/pinching/kicking staff. Also did a facetime call with pt&son.

## 2019-12-06 NOTE — Progress Notes (Signed)
Pharmacy Antibiotic Note  Shirley [name] is a 86 y.o. female admitted on 12/02/2019 with pneumonia.  Pharmacy has been consulted for vancomycin and cefepime dosing. Pt is afebrile but WBC is mildly elevated at 10.9. SCr is above baseline at 1.23. Pt was prescribed augmentin PTA for a dental infection.   Vancomycin d/c with negative MRSA PCR SCr stable, cultures negative to date  Plan: Continue cefepime 2gm IV Q12H Day 4/?, stop after 5 days?  F/u renal fxn, C&S, clinical status  Height: 5\' 5"  (165.1 cm) Weight: 131 lb 2.8 oz (59.5 kg) IBW/kg (Calculated) : 57  Temp (24hrs), Avg:98 F (36.7 C), Min:97.5 F (36.4 C), Max:98.6 F (37 C)  Recent Labs  Lab 12/02/19 1422 12/02/19 1529 12/03/19 0008 12/03/19 0044 12/03/19 1001 12/04/19 0825 12/05/19 0105 12/06/19 1100  WBC 10.9*  --   --  13.7*  --  7.5 7.8 11.8*  CREATININE 1.23*  --   --  1.06*  --  0.61 0.55 0.79  LATICACIDVEN  --  2.1* 5.5*  --  1.7  --   --   --     Estimated Creatinine Clearance: 47.9 mL/min (by C-G formula based on SCr of 0.79 mg/dL).    No Known Allergies  Antimicrobials this admission: Vanc 12/28 x 1 Cefepime 12/28>>  Dose adjustments this admission: N/A  Microbiology results: 12/28 BCx: ngtd 12/29 MRSA PCR: neg 12/28 UCx: ngf  Thank you for allowing pharmacy to be a part of this patient's care.  1/29 D 12/06/2019 1:02 PM

## 2019-12-06 NOTE — Progress Notes (Addendum)
Daily Progress Note   Patient Name: Shirley [name]       Date: 12/06/2019 DOB: [birth year]  Age: 86 y.o. MRN#: [id number] Attending Physician: Zigmund Daniel., * Primary Care Physician: Roderick Pee, MD (Inactive) Admit Date: 12/02/2019  Reason for Consultation/Follow-up: Establishing goals of care  Subjective: Per report from RN, patient is confused and agitated this AM when staff provides care. No oral intake. She is requiring wrist restraints due to pulling off oxygen.   GOC:  F/u with son, Tawanna Cooler via telephone to discuss goals of care. Provided update for Todd including concern with higher oxygen requirements, poor oral intake, and increased confusion/agitation requiring restraints. Discussed Seroquel and hope that she will take medications this evening. Reviewed medications and plan of care, explaining that she should be showing improvement from medical management (ABX, remdesivir, steroids). Recommended against feeding tube placement with ongoing need for restraints and high risk she would remove feeding tube/not understand why feeding tube is there with underlying dementia.  Tawanna Cooler does acknowledge "that worries me" but also remains hopeful that with ongoing medical management, she will overcome these "hurdles." Tawanna Cooler is hopeful to hear from Dr. Lowell Guitar this evening and is very appreciative of daily updates from Dr. Lowell Guitar and care team. Tawanna Cooler is also requesting to attempt Facetime with patient's RN this afternoon. Reassured Tawanna Cooler that I would speak with RN and also reassured of ongoing palliative support.   Length of Stay: 4  Current Medications: Scheduled Meds:  . citalopram  10 mg Oral Daily  . donepezil  10 mg Oral QHS  . enoxaparin (LOVENOX) injection  40 mg Subcutaneous Q24H  .  feeding supplement (ENSURE ENLIVE)  237 mL Oral TID BM  . mouth rinse  15 mL Mouth Rinse BID  . methylPREDNISolone (SOLU-MEDROL) injection  60 mg Intravenous Q12H  . metoprolol succinate  25 mg Oral QHS  . QUEtiapine  12.5 mg Oral QHS  . traZODone  50 mg Oral QHS  . vitamin B-12  2,000 mcg Oral Daily    Continuous Infusions: . ceFEPime (MAXIPIME) IV Stopped (12/06/19 0525)  . remdesivir 100 mg in NS 100 mL 100 mg (12/06/19 0959)    PRN Meds: acetaminophen, acetaminophen  Physical Exam          Vital Signs: BP (!) 155/78 (BP Location: Left  Arm)   Pulse (!) 58   Temp 98.2 F (36.8 C) (Axillary)   Resp 20   Ht 5\' 5"  (1.651 m)   Wt 59.5 kg   SpO2 92%   BMI 21.83 kg/m  SpO2: SpO2: 92 % O2 Device: O2 Device: High Flow Nasal Cannula O2 Flow Rate: O2 Flow Rate (L/min): 6 L/min  Intake/output summary:   Intake/Output Summary (Last 24 hours) at 12/06/2019 1107 Last data filed at 12/06/2019 0800 Gross per 24 hour  Intake 650 ml  Output 250 ml  Net 400 ml   LBM: Last BM Date: 12/05/19 Baseline Weight: Weight: 59.5 kg Most recent weight: Weight: 59.5 kg       Palliative Assessment/Data: PPS 20%      Patient Active Problem List   Diagnosis Date Noted  . Pneumonia due to COVID-19 virus 12/03/2019  . Hypernatremia   . Healthcare-associated pneumonia 12/02/2019  . Palliative care by specialist   . Goals of care, counseling/discussion   . DNR (do not resuscitate)   . Acute encephalopathy 11/23/2019  . Ascending aortic aneurysm (HCC) 11/23/2019  . Moderate dementia (HCC) 08/10/2015  . Frequent falls 05/08/2015  . Anxiety 05/08/2015  . Anxiety state, unspecified 03/06/2014  . Short-term memory loss 12/30/2013  . Lumbar degenerative disc disease 08/22/2011  . Polymyalgia rheumatica (HCC) 05/16/2008  . Essential hypertension 03/12/2008  . Arthritis, senescent 03/12/2008  . MYALGIA 03/12/2008  . OSTEOPOROSIS 03/12/2008    Palliative Care Assessment & Plan   Patient  Profile: 86 y.o. female  with past medical history of dementia, anxiety, hypertension, polymyalgia rheumatica admitted on 11/23/2019 with increased confusion and left-sided weakness. CT head, CTA head, and MRI negative for acute findings. Neurology signed off. Patient with acute metabolic encephalopathy, questionable progression of dementia and/or vitamin B12 deficiency. Gingivitis receiving Augmentin with recommendation for outpatient follow-up with dentist. Generalized deconditioning. Palliative medicine consultation for goals of care.   Patient discharged to SNF for rehab on 11/28/19. Readmission 12/02/19 for decreased oral intake, tachycardia, tachypnea, and hypoxia. Found to have sepsis secondary to COVID 19 pneumonia and possible bacterial pneumonia. Son confirms DNR/DNI but would like medical management. Patient receiving IVF, ABX, steroids, and remdesivir. Ongoing poor oral intake and agitation requiring restraints. Baseline dementia.   Assessment: Sepsis Covid Pneumonia with possible bacterial pneumonia Acute metabolic encephalopathy SVT Dementia PMR Hypernatremia  Recommendations/Plan: DNR/DNI Otherwise, continue current plan of care and medical management.  Continue encouragement of oral intake.  Explained to son recommendation against feeding tube placement with underlying dementia and the need for restraints.  Continue ongoing palliative discussions. Patient to complete remdesivir 12/08/19. PMT to follow.    Code Status: DNR/DNI   Code Status Orders  (From admission, onward)         Start     Ordered   12/02/19 1746  Do not attempt resuscitation (DNR)  Continuous    Question Answer Comment  In the event of cardiac or respiratory ARREST Do not call a "code blue"   In the event of cardiac or respiratory ARREST Do not perform Intubation, CPR, defibrillation or ACLS   In the event of cardiac or respiratory ARREST Use medication by any route, position, wound care, and other  measures to relive pain and suffering. May use oxygen, suction and manual treatment of airway obstruction as needed for comfort.      12/02/19 1748        Code Status History    Date Active Date Inactive Code Status Order ID Comments  User Context   11/27/2019 1227 11/28/2019 1623 DNR [id number]  Basilio Cairo, NP Inpatient   11/24/2019 0222 11/27/2019 1226 Full Code [id number]  Vianne Bulls, MD Inpatient   07/04/2015 2206 07/06/2015 1833 Full Code [id number]  Toy Baker, MD Inpatient   10/20/2013 2100 10/22/2013 1824 Full Code [id number]  Hosie Poisson, MD Inpatient   02/28/2013 1915 03/01/2013 1324 Full Code [id number]  Ophelia Charter, MD Inpatient   Advance Care Planning Activity    Advance Directive Documentation     Most Recent Value  Type of Advance Directive  Out of facility DNR (pink MOST or yellow form)  Pre-existing out of facility DNR order (yellow form or pink MOST form)  --  "MOST" Form in Place?  --      Prognosis:  Poor prognosis  Discharge Planning: To Be Determined  Care plan was discussed with RN, Dr. Florene Glen, son Sherren Mocha)  Thank you for allowing the Palliative Medicine Team to assist in the care of this patient.  The above conversation was completed via telephone due to visitor restrictions during COVID-19 pandemic. Thorough chart review and discussion with multidisciplinary team was completed as part of assessment. No physical examination was performed.   Time In: 1050- 1500- Time Out: 1100 1515 Total Time 25 Prolonged Time Billed  no      Greater than 50%  of this time was spent counseling and coordinating care related to the above assessment and plan.  Ihor Dow, DNP, FNP-C Palliative Medicine Team  Phone: [phone] Fax: [phone]  Please contact Palliative Medicine Team phone at [phone] for questions and concerns.

## 2019-12-06 NOTE — Progress Notes (Addendum)
PROGRESS NOTE    Shirley [name]  OZD:[id number] DOB: [birth year] DOA: 12/02/2019 PCP: Dorena Cookey, MD (Inactive)   Brief Narrative: Shirley [name] 86 y.o.femalewith medical history significant ofdementia; HTN; PMR; and recent hospitalization presenting with code sepsis. She was previously hospitalized from 12/19-24 with acute metabolic encephalopathy and fever of uncertain source. MOST form was completed with DNR/DNI; limited additional interventions including rehospitalization, limited abx, IVF, and time trial feeding tube after discussion with family. She had poor PO intake, tachycardia, tachypnea at her facility with O2 sats 80s, improved to 96% with 3L [place] O2. GCS 9 in the field, improved with IVF bolus. The facility told her son the HR was increased, and this was the same reason she was here last time. Family has not seen her in [name] long time. Her son had [name] double lung transplant on Feb 9, last saw her Feb 8 prior to last hospitalization. He recognizes that her dementia has gotten worse over the last 10 months. At the time of last d/c, she was talking [name] lot but all over the place, alert. She alsoslept [name] lot while she was here last time.   ED Course:Recently admitted. Called code stroke, not code stroke. Dental infection, given Augmentin and sent back. Sats 80, HR 120, appears to have PNA. Previously with palliative care consult, limited care. Temp 100.3.  Assessment & Plan:   Principal Problem:   Healthcare-associated pneumonia Active Problems:   Essential hypertension   Polymyalgia rheumatica (HCC)   Moderate dementia ([name])   Acute encephalopathy   Goals of care, counseling/discussion   DNR (do not resuscitate)   Pneumonia due to COVID-19 virus   Hypernatremia  # Goals of care/DNR Patient noted to have [name] poor overall prognosis and palliative care consulted during last hospitalization.  Patient made Javanna Patin DNR and MOST form was completed  at that time.  Plan for this admission is to see how she does with care noted below, if she fails to improve or worsens, consider comfort care.  She continues to be confused today.  Per RN not eating Annamaria Salah whole lot - will follow closely throughout day today.  Her oxygen needs are slightly increased today.  Will discuss with palliative and son.  1 Sepsis secondary to Covid Pneumonia  Possible Bacterial Pneumonia  Patient with recent hospitalization discharged on 11/28/2019 and had presented with fever and thought to be related to [name] dental infection and discharged back to assisted living facility with Augmentin.   Patient presented back with decreased oral intake, tachycardia, tachypnea noted to be hypoxic which improved on O2.   Pt met criteria for sepsis on admission Currently on 6 L [place] CXR from 12/28 with airspace opacity on R in RLL region concerning for pneumonia Urine cx no growth, blood cx NGTD x3 Negative MRSA PCR, will d/c vancomycin Continue cefepime for now, deescalate as appropriate (will plan for 5 days) Continue remdesivir and solumedrol   COVID-19 Labs  Recent Labs    12/04/19 0825 12/05/19 0105 12/06/19 1100  DDIMER 3.01* 2.78* 2.70*  FERRITIN 1,139* 965* 572*  CRP 13.3* 8.7* 4.4*    Lab Results  Component Value Date   SARSCOV2NAA POSITIVE (Elexia Friedt) 12/02/2019   [place] NEGATIVE 11/27/2019   [place] NEGATIVE 11/23/2019    2.  Acute metabolic encephalopathy Likely secondary to problem #1 in the setting of underlying dementia and dehydration.  She continues to be pleasantly confused today, [name]&Ox1. Per nursing worsened agitation this AM, will  trial seroquel     # Superventricular Tachycardia: 2 runs 12/31 AM.  Restart metoprolol.    3.  Moderate dementia resume Aricept.  Continue to hold Celexa for now.  4.  Hypertension Metoprolol  5.  PMR Patient currently on IV steroids secondary to problem #1.  Outpatient follow-up.  6.  Hypernatremia Improved, follow  off IVF  # Poor PO intake: encourage PO, follow closely  # Elevated LFT's: continue to monitor   DVT prophylaxis: lovenox Code Status: DNR Family Communication: none at bedside - discussed with son 12/06/19 Disposition Plan: pending  Consultants:   Palliative care  Procedures:   none  Antimicrobials:  Anti-infectives (From admission, onward)   Start     Dose/Rate Route Frequency Ordered Stop   12/04/19 1400  vancomycin (VANCOREADY) IVPB 1250 mg/250 mL  Status:  Discontinued     1,250 mg 166.7 mL/hr over 90 Minutes Intravenous Every 48 hours 12/02/19 1555 12/04/19 1232   12/04/19 1000  remdesivir 100 mg in sodium chloride 0.9 % 100 mL IVPB     100 mg 200 mL/hr over 30 Minutes Intravenous Daily 12/03/19 0912 12/08/19 0959   12/03/19 0915  remdesivir 200 mg in sodium chloride 0.9% 250 mL IVPB     200 mg 580 mL/hr over 30 Minutes Intravenous Once 12/03/19 0912 12/03/19 1117   12/03/19 0500  ceFEPIme (MAXIPIME) 2 g in sodium chloride 0.9 % 100 mL IVPB     2 g 200 mL/hr over 30 Minutes Intravenous Every 12 hours 12/02/19 1555     12/02/19 1530  ceFEPIme (MAXIPIME) 2 g in sodium chloride 0.9 % 100 mL IVPB     2 g 200 mL/hr over 30 Minutes Intravenous  Once 12/02/19 1523 12/02/19 1758   12/02/19 1530  vancomycin (VANCOCIN) IVPB 1000 mg/200 mL premix     1,000 mg 200 mL/hr over 60 Minutes Intravenous  Once 12/02/19 1523 12/02/19 1758     Subjective: Confused.  Nonsensical speech.   Objective: Vitals:   12/06/19 0342 12/06/19 0604 12/06/19 0900 12/06/19 0922  BP: (!) 155/78  (!) 160/80   Pulse: (!) 58  (!) 51 62  Resp:   (!) 22 17  Temp: 98.2 F (36.8 C)  (!) 97.5 F (36.4 C)   TempSrc: Axillary  Axillary   SpO2: 91% 92% 90% 91%  Weight:      Height:        Intake/Output Summary (Last 24 hours) at 12/06/2019 1455 Last data filed at 12/06/2019 1100 Gross per 24 hour  Intake 760 ml  Output 250 ml  Net 510 ml   Filed Weights   12/02/19 1500  Weight: 59.5 kg     Examination:  General: No acute distress. Cardiovascular: RRR Lungs: Clear to auscultation bilaterally  Abdomen: Soft, nontender, nondistended  Neurological: Alert and oriented 1. Moves all extremities 4. Cranial nerves II through XII grossly intact. Skin: Warm and dry. No rashes or lesions. Extremities: No clubbing or cyanosis. No edema.   Data Reviewed: I have personally reviewed following labs and imaging studies  CBC: Recent Labs  Lab 12/02/19 1422 12/03/19 0044 12/04/19 0825 12/05/19 0105 12/06/19 1100  WBC 10.9* 13.7* 7.5 7.8 11.8*  NEUTROABS 9.6* 11.6*  --  6.5 10.0*  HGB 17.6* 17.3* 14.5 14.0 15.3*  HCT 54.2* 56.2* 45.7 42.6 46.9*  MCV 92.0 97.2 93.3 91.4 91.2  PLT 132* 137* 156 157 626   Basic Metabolic Panel: Recent Labs  Lab 12/02/19 1422 12/03/19 0044 12/04/19 0825 12/05/19  0105 12/06/19 1100  NA 143 147* 141 142 145  K 4.2 4.6 3.7 3.5 3.6  CL 108 113* 110 110 108  CO2 24 18* 21* 21* 26  GLUCOSE 147* 105* 196* 138* 168*  BUN 43* 41* 33* 31* 31*  CREATININE 1.23* 1.06* 0.61 0.55 0.79  CALCIUM 8.2* 8.1* 7.8* 8.4* 8.5*  MG  --   --   --  2.1 2.1  PHOS  --   --   --  2.3* 2.7   GFR: Estimated Creatinine Clearance: 47.9 mL/min (by C-G formula based on SCr of 0.79 mg/dL). Liver Function Tests: Recent Labs  Lab 12/02/19 1422 12/04/19 0825 12/05/19 0105 12/06/19 1100  AST 31 34 38 85*  ALT '24 17 21 ' 45*  ALKPHOS 49 36* 39 51  BILITOT 0.4 0.7 0.9 0.6  PROT 6.2* 5.3* 5.3* 6.0*  ALBUMIN 2.6* 2.2* 2.4* 2.6*   No results for input(s): LIPASE, AMYLASE in the last 168 hours. No results for input(s): AMMONIA in the last 168 hours. Coagulation Profile: Recent Labs  Lab 12/03/19 1001  INR 1.3*   Cardiac Enzymes: No results for input(s): CKTOTAL, CKMB, CKMBINDEX, TROPONINI in the last 168 hours. BNP (last 3 results) No results for input(s): PROBNP in the last 8760 hours. HbA1C: No results for input(s): HGBA1C in the last 72  hours. CBG: Recent Labs  Lab 12/04/19 0010  GLUCAP 140*   Lipid Profile: No results for input(s): CHOL, HDL, LDLCALC, TRIG, CHOLHDL, LDLDIRECT in the last 72 hours. Thyroid Function Tests: No results for input(s): TSH, T4TOTAL, FREET4, T3FREE, THYROIDAB in the last 72 hours. Anemia Panel: Recent Labs    12/05/19 0105 12/06/19 1100  FERRITIN 965* 572*   Sepsis Labs: Recent Labs  Lab 12/02/19 1529 12/03/19 0008 12/03/19 0635 12/03/19 1001  PROCALCITON  --   --  0.21  --   LATICACIDVEN 2.1* 5.5*  --  1.7    Recent Results (from the past 240 hour(s))  SARS CORONAVIRUS 2 (TAT 6-24 HRS) Nasopharyngeal Nasopharyngeal Swab     Status: None   Collection Time: 11/27/19  2:00 PM   Specimen: Nasopharyngeal Swab  Result Value Ref Range Status   SARS Coronavirus 2 NEGATIVE NEGATIVE Final    Comment: (NOTE) SARS-CoV-2 target nucleic acids are NOT DETECTED. The SARS-CoV-2 RNA is generally detectable in upper and lower respiratory specimens during the acute phase of infection. Negative results do not preclude SARS-CoV-2 infection, do not rule out co-infections with other pathogens, and should not be used as the sole basis for treatment or other patient management decisions. Negative results must be combined with clinical observations, patient history, and epidemiological information. The expected result is Negative. Fact Sheet for Patients: SugarRoll.be Fact Sheet for Healthcare Providers: https://www.woods-mathews.com/ This test is not yet approved or cleared by the [place] FDA and  has been authorized for detection and/or diagnosis of SARS-CoV-2 by FDA under an Emergency Use Authorization (EUA). This EUA will remain  in effect (meaning this test can be used) for the duration of the COVID-19 declaration under Section 56 4(b)(1) of the Act, 21 U.S.C. section 360bbb-3(b)(1), unless the authorization is terminated or revoked  sooner. Performed at [facility], [place] [address] [place], [place] [address]   Culture, blood (routine x 2)     Status: None (Preliminary result)   Collection Time: 12/02/19  2:16 PM   Specimen: BLOOD  Result Value Ref Range Status   Specimen Description BLOOD LEFT ANTECUBITAL  Final   Special  Requests   Final    BOTTLES DRAWN AEROBIC AND ANAEROBIC Blood Culture results may not be optimal due to an inadequate volume of blood received in culture bottles   Culture   Final    NO GROWTH 4 DAYS Performed at [facility], [place] [address], [place], [place] [address]    Report Status PENDING  Incomplete  Culture, blood (routine x 2)     Status: None (Preliminary result)   Collection Time: 12/02/19  2:21 PM   Specimen: BLOOD  Result Value Ref Range Status   Specimen Description BLOOD BLOOD RIGHT WRIST  Final   Special Requests   Final    BOTTLES DRAWN AEROBIC AND ANAEROBIC Blood Culture results may not be optimal due to an inadequate volume of blood received in culture bottles   Culture   Final    NO GROWTH 4 DAYS Performed at [facility], [place] [address] [place], [place] [address]    Report Status PENDING  Incomplete  Urine culture     Status: None   Collection Time: 12/02/19  7:05 PM   Specimen: Urine, Random  Result Value Ref Range Status   Specimen Description URINE, RANDOM  Final   Special Requests NONE  Final   Culture   Final    NO GROWTH Performed at [facility], [place] [address], [place], [place] [address]    Report Status 12/03/2019 FINAL  Final  Respiratory Panel by RT PCR (Flu Cammy Sanjurjo&B, Covid) - Nasopharyngeal Swab     Status: Abnormal   Collection Time: 12/02/19  7:05 PM   Specimen: Nasopharyngeal Swab  Result Value Ref Range Status   SARS Coronavirus 2 by RT PCR POSITIVE ([place] Spr[place]) NEGATIVE Final    Comment: RESULT CALLED TO, READ BACK BY AND VERIFIED WITH: Pecola Lawless RN 12/02/19 2024 JDW (NOTE) SARS-CoV-2 target nucleic acids are DETECTED. SARS-CoV-2  RNA is generally detectable in upper respiratory specimens  during the acute phase of infection. Positive results are indicative of the presence of the identified virus, but do not rule out bacterial infection or co-infection with other pathogens not detected by the test. Clinical correlation with patient history and other diagnostic information is necessary to determine patient infection status. The expected result is Negative. Fact Sheet for Patients:  PinkCheek.be Fact Sheet for Healthcare Providers: GravelBags.it This test is not yet approved or cleared by the [place] FDA and  has been authorized for detection and/or diagnosis of SARS-CoV-2 by FDA under an Emergency Use Authorization (EUA).  This EUA will remain in effect (meaning this test can be used) for t he duration of  the COVID-19 declaration under Section 564(b)(1) of the Act, 21 U.S.C. section 360bbb-3(b)(1), unless the authorization is terminated or revoked sooner.    Influenza [facility]a [facility]vera by PCR NEGATIVE NEGATIVE Final   Influenza B by PCR NEGATIVE NEGATIVE Final    Comment: (NOTE) The Xpert Xpress SARS-CoV-2/FLU/RSV assay is intended as an aid in  the diagnosis of influenza from Nasopharyngeal swab specimens and  should not be used as Panzy Bubeck sole basis for treatment. Nasal washings and  aspirates are unacceptable for Xpert Xpress SARS-CoV-2/FLU/RSV  testing. Fact Sheet for Patients: PinkCheek.be Fact Sheet for Healthcare Providers: GravelBags.it This test is not yet approved or cleared by the [place] FDA and  has been authorized for detection and/or diagnosis of SARS-CoV-2 by  FDA under an Emergency Use Authorization (EUA). This EUA will remain  in effect (meaning this test can be used)  for the duration of the  Covid-19 declaration under Section 564(b)(1) of the Act, 21  U.S.C. section 360bbb-3(b)(1),  unless the authorization is  terminated or revoked. Performed at [facility], [place] [address] [place], [place] [address]   MRSA PCR Screening     Status: None   Collection Time: 12/03/19  6:30 PM   Specimen: Nasopharyngeal  Result Value Ref Range Status   MRSA by PCR NEGATIVE NEGATIVE Final    Comment:        The GeneXpert MRSA Assay (FDA approved for NASAL specimens only), is one component of Emiliya Chretien comprehensive MRSA colonization surveillance program. It is not intended to diagnose MRSA infection nor to guide or monitor treatment for MRSA infections. Performed at [facility], [place] [address], [place], [place] [address]          Radiology Studies: No results found.      Scheduled Meds: . citalopram  10 mg Oral Daily  . donepezil  10 mg Oral QHS  . enoxaparin (LOVENOX) injection  40 mg Subcutaneous Q24H  . feeding supplement (ENSURE ENLIVE)  237 mL Oral TID BM  . mouth rinse  15 mL Mouth Rinse BID  . methylPREDNISolone (SOLU-MEDROL) injection  60 mg Intravenous Q12H  . metoprolol succinate  25 mg Oral QHS  . QUEtiapine  12.5 mg Oral QHS  . traZODone  50 mg Oral QHS  . vitamin B-12  2,000 mcg Oral Daily   Continuous Infusions: . ceFEPime (MAXIPIME) IV Stopped (12/06/19 0525)  . remdesivir 100 mg in NS 100 mL 100 mg (12/06/19 0959)     LOS: 4 days    Time spent: over 70 min    Fayrene Helper, MD [facility] Pager AMION  If 7PM-7AM, please contact night-coverage [email or web address] Password [email or web address] 12/06/2019, 2:55 PM

## 2019-12-06 NOTE — Plan of Care (Signed)

## 2019-12-07 ENCOUNTER — Inpatient Hospital Stay (HOSPITAL_COMMUNITY): Payer: Medicare PPO

## 2019-12-07 LAB — CULTURE, BLOOD (ROUTINE X 2)
Culture: NO GROWTH
Culture: NO GROWTH

## 2019-12-07 LAB — CBC WITH DIFFERENTIAL/PLATELET
Abs Immature Granulocytes: 0.07 10*3/uL (ref 0.00–0.07)
Basophils Absolute: 0 10*3/uL (ref 0.0–0.1)
Basophils Relative: 0 %
Eosinophils Absolute: 0 10*3/uL (ref 0.0–0.5)
Eosinophils Relative: 0 %
HCT: 44.6 % (ref 36.0–46.0)
Hemoglobin: 14.5 g/dL (ref 12.0–15.0)
Immature Granulocytes: 1 %
Lymphocytes Relative: 8 %
Lymphs Abs: 0.8 10*3/uL (ref 0.7–4.0)
MCH: 29.2 pg (ref 26.0–34.0)
MCHC: 32.5 g/dL (ref 30.0–36.0)
MCV: 89.9 fL (ref 80.0–100.0)
Monocytes Absolute: 0.7 10*3/uL (ref 0.1–1.0)
Monocytes Relative: 6 %
Neutro Abs: 8.9 10*3/uL — ABNORMAL HIGH (ref 1.7–7.7)
Neutrophils Relative %: 85 %
Platelets: 211 10*3/uL (ref 150–400)
RBC: 4.96 MIL/uL (ref 3.87–5.11)
RDW: 13.8 % (ref 11.5–15.5)
WBC: 10.4 10*3/uL (ref 4.0–10.5)
nRBC: 0 % (ref 0.0–0.2)

## 2019-12-07 LAB — COMPREHENSIVE METABOLIC PANEL
ALT: 78 U/L — ABNORMAL HIGH (ref 0–44)
AST: 114 U/L — ABNORMAL HIGH (ref 15–41)
Albumin: 2.6 g/dL — ABNORMAL LOW (ref 3.5–5.0)
Alkaline Phosphatase: 55 U/L (ref 38–126)
Anion gap: 13 (ref 5–15)
BUN: 31 mg/dL — ABNORMAL HIGH (ref 8–23)
CO2: 25 mmol/L (ref 22–32)
Calcium: 8.3 mg/dL — ABNORMAL LOW (ref 8.9–10.3)
Chloride: 104 mmol/L (ref 98–111)
Creatinine, Ser: 0.63 mg/dL (ref 0.44–1.00)
GFR calc Af Amer: >60 mL/min (ref >60)
GFR calc non Af Amer: >60 mL/min (ref >60)
Glucose, Bld: 207 mg/dL — ABNORMAL HIGH (ref 70–99)
Potassium: 3.5 mmol/L (ref 3.5–5.1)
Sodium: 142 mmol/L (ref 135–145)
Total Bilirubin: 0.7 mg/dL (ref 0.3–1.2)
Total Protein: 5.6 g/dL — ABNORMAL LOW (ref 6.5–8.1)

## 2019-12-07 LAB — D-DIMER, QUANTITATIVE: D-Dimer, Quant: 2.11 ug/mL-FEU — ABNORMAL HIGH (ref 0.00–0.50)

## 2019-12-07 LAB — HEPATITIS PANEL, ACUTE
HCV Ab: NONREACTIVE
Hep A IgM: NONREACTIVE
Hep B C IgM: NONREACTIVE
Hepatitis B Surface Ag: NONREACTIVE

## 2019-12-07 LAB — FERRITIN: Ferritin: 449 ng/mL — ABNORMAL HIGH (ref 11–307)

## 2019-12-07 LAB — MAGNESIUM: Magnesium: 2.2 mg/dL (ref 1.7–2.4)

## 2019-12-07 LAB — PHOSPHORUS: Phosphorus: 2.6 mg/dL (ref 2.5–4.6)

## 2019-12-07 LAB — C-REACTIVE PROTEIN: CRP: 3.3 mg/dL — ABNORMAL HIGH (ref <1.0)

## 2019-12-07 MED ORDER — AMLODIPINE BESYLATE 5 MG PO TABS
5.0000 mg | ORAL_TABLET | Freq: Every day | ORAL | Status: DC
  Administered 2019-12-07 – 2019-12-13 (×7): 5 mg via ORAL
  Filled 2019-12-07 (×7): qty 1

## 2019-12-07 MED ORDER — AMLODIPINE BESYLATE 5 MG PO TABS: 5.0000 mg | ORAL_TABLET | Freq: Every day | ORAL | Status: DC

## 2019-12-07 NOTE — Progress Notes (Addendum)
PROGRESS NOTE    RUBYLEE ZAMARRIPA  YIR:[id number] DOB: [birth year] DOA: 12/02/2019 PCP: Dorena Cookey, MD (Inactive)   Brief Narrative: HPI per Dr. Doretha Imus Bloweis Cloe Sockwell 86 y.o.femalewith medical history significant ofdementia; HTN; PMR; and recent hospitalization presenting with code sepsis. She was previously hospitalized from 12/19-24 with acute metabolic encephalopathy and fever of uncertain source. MOST form was completed with DNR/DNI; limited additional interventions including rehospitalization, limited abx, IVF, and time trial feeding tube after discussion with family. She had poor PO intake, tachycardia, tachypnea at her facility with O2 sats 80s, improved to 96% with 3L [place] O2. GCS 9 in the field, improved with IVF bolus. The facility told her son the HR was increased, and this was the same reason she was here last time. Family has not seen her in [place] long time. Her son had Angel Weedon double lung transplant on Feb 9, last saw her Feb 8 prior to last hospitalization. He recognizes that her dementia has gotten worse over the last 10 months. At the time of last d/c, she was talking Danie Hannig lot but all over the place, alert. She alsoslept Liyat Faulkenberry lot while she was here last time.   ED Course:Recently admitted. Called code stroke, not code stroke. Dental infection, given Augmentin and sent back. Sats 80, HR 120, appears to have PNA. Previously with palliative care consult, limited care. Temp 100.3.  Assessment & Plan:   Principal Problem:   Healthcare-associated pneumonia Active Problems:   Essential hypertension   Polymyalgia rheumatica (HCC)   Moderate dementia ([place])   Acute encephalopathy   Goals of care, counseling/discussion   DNR (do not resuscitate)   Pneumonia due to COVID-19 virus   Hypernatremia   Dementia with behavioral disturbance ([place])  # Goals of care/DNR Patient noted to have Jerica Creegan poor overall prognosis and palliative care consulted during last hospitalization.   Patient made Avana Kreiser DNR and MOST form was completed at that time.  Plan for this admission is to see how she does with care noted below, if she fails to improve or worsens, consider comfort care.  She continues to be confused today.  Will continue to discuss POC with son/palliative care.  1 Sepsis secondary to Covid Pneumonia  Possible Bacterial Pneumonia  Patient with recent hospitalization discharged on 11/28/2019 and had presented with fever and thought to be related to Lyndall Bellot dental infection and discharged back to assisted living facility with Augmentin.   Patient presented back with decreased oral intake, tachycardia, tachypnea noted to be hypoxic which improved on O2.   Pt met criteria for sepsis on admission Currently on 6 L [place] CXR from 12/28 with airspace opacity on R in RLL region concerning for pneumonia CXR 1/02 with worsening multifocal pneumonia Urine cx no growth, blood cx NGTD x5 Negative MRSA PCR, will d/c vancomycin Cefepime x 7 days Continue remdesivir and solumedrol   COVID-19 Labs  Recent Labs    12/05/19 0105 12/06/19 1100 12/07/19 0312  DDIMER 2.78* 2.70* 2.11*  FERRITIN 965* 572* 449*  CRP 8.7* 4.4* 3.3*    Lab Results  Component Value Date   SARSCOV2NAA POSITIVE (Nathaly Dawkins) 12/02/2019   [place] NEGATIVE 11/27/2019   [place] NEGATIVE 11/23/2019    2.  Acute metabolic encephalopathy Likely secondary to problem #1 in the setting of underlying dementia and dehydration.  She continues to be confused.  Possibly slightly more confused today, not saying much at all.  Trial of seroquel  # Superventricular Tachycardia: 2 runs 12/31 AM.  Restart metoprolol.  3.  Moderate dementia resume Aricept.  Continue to hold Celexa for now.  4.  Hypertension Metoprolol  5.  PMR Patient currently on IV steroids secondary to problem #1.  Outpatient follow-up.  6.  Hypernatremia Improved, follow off IVF  # Poor PO intake: encourage PO, follow closely  # Elevated  LFT's: continue to monitor, rising.  Follow acute hepatitis panel.   DVT prophylaxis: lovenox Code Status: DNR Family Communication: none at bedside - discussed with son 12/07/19 Disposition Plan: pending  Consultants:   Palliative care  Procedures:   none  Antimicrobials:  Anti-infectives (From admission, onward)   Start     Dose/Rate Route Frequency Ordered Stop   12/04/19 1400  vancomycin (VANCOREADY) IVPB 1250 mg/250 mL  Status:  Discontinued     1,250 mg 166.7 mL/hr over 90 Minutes Intravenous Every 48 hours 12/02/19 1555 12/04/19 1232   12/04/19 1000  remdesivir 100 mg in sodium chloride 0.9 % 100 mL IVPB     100 mg 200 mL/hr over 30 Minutes Intravenous Daily 12/03/19 0912 12/07/19 1041   12/03/19 0915  remdesivir 200 mg in sodium chloride 0.9% 250 mL IVPB     200 mg 580 mL/hr over 30 Minutes Intravenous Once 12/03/19 0912 12/03/19 1117   12/03/19 0500  ceFEPIme (MAXIPIME) 2 g in sodium chloride 0.9 % 100 mL IVPB     2 g 200 mL/hr over 30 Minutes Intravenous Every 12 hours 12/02/19 1555 12/08/19 0459   12/02/19 1530  ceFEPIme (MAXIPIME) 2 g in sodium chloride 0.9 % 100 mL IVPB     2 g 200 mL/hr over 30 Minutes Intravenous  Once 12/02/19 1523 12/02/19 1758   12/02/19 1530  vancomycin (VANCOCIN) IVPB 1000 mg/200 mL premix     1,000 mg 200 mL/hr over 60 Minutes Intravenous  Once 12/02/19 1523 12/02/19 1758     Subjective: Confused, doesn't say anything today.  Objective: Vitals:   12/07/19 0814 12/07/19 0933 12/07/19 1122 12/07/19 1345  BP: (!) 168/78 (!) 168/77 133/62 122/63  Pulse: (!) 56   77  Resp:    20  Temp:    (!) 97.4 F (36.3 C)  TempSrc:    Axillary  SpO2: 91%   92%  Weight:      Height:        Intake/Output Summary (Last 24 hours) at 12/07/2019 1456 Last data filed at 12/07/2019 1100 Gross per 24 hour  Intake 515.26 ml  Output 1050 ml  Net -534.74 ml   Filed Weights   12/02/19 1500  Weight: 59.5 kg    Examination:  General: No acute  distress. Cardiovascular: RRR Lungs: Clear to auscultation bilaterally Abdomen: Soft, nontender, nondistended  Neurological: Alert and disoriented. Moves all extremities 4. Cranial nerves II through XII grossly intact. Skin: Warm and dry. No rashes or lesions. Extremities: No clubbing or cyanosis. No edema.  Data Reviewed: I have personally reviewed following labs and imaging studies  CBC: Recent Labs  Lab 12/02/19 1422 12/03/19 0044 12/04/19 0825 12/05/19 0105 12/06/19 1100 12/07/19 0312  WBC 10.9* 13.7* 7.5 7.8 11.8* 10.4  NEUTROABS 9.6* 11.6*  --  6.5 10.0* 8.9*  HGB 17.6* 17.3* 14.5 14.0 15.3* 14.5  HCT 54.2* 56.2* 45.7 42.6 46.9* 44.6  MCV 92.0 97.2 93.3 91.4 91.2 89.9  PLT 132* 137* 156 157 203 468   Basic Metabolic Panel: Recent Labs  Lab 12/03/19 0044 12/04/19 0825 12/05/19 0105 12/06/19 1100 12/07/19 0312  NA 147* 141 142 145 142  K 4.6  3.7 3.5 3.6 3.5  CL 113* 110 110 108 104  CO2 18* 21* 21* 26 25  GLUCOSE 105* 196* 138* 168* 207*  BUN 41* 33* 31* 31* 31*  CREATININE 1.06* 0.61 0.55 0.79 0.63  CALCIUM 8.1* 7.8* 8.4* 8.5* 8.3*  MG  --   --  2.1 2.1 2.2  PHOS  --   --  2.3* 2.7 2.6   GFR: Estimated Creatinine Clearance: 47.9 mL/min (by C-G formula based on SCr of 0.63 mg/dL). Liver Function Tests: Recent Labs  Lab 12/02/19 1422 12/04/19 0825 12/05/19 0105 12/06/19 1100 12/07/19 0312  AST 31 34 38 85* 114*  ALT '24 17 21 ' 45* 78*  ALKPHOS 49 36* 39 51 55  BILITOT 0.4 0.7 0.9 0.6 0.7  PROT 6.2* 5.3* 5.3* 6.0* 5.6*  ALBUMIN 2.6* 2.2* 2.4* 2.6* 2.6*   No results for input(s): LIPASE, AMYLASE in the last 168 hours. No results for input(s): AMMONIA in the last 168 hours. Coagulation Profile: Recent Labs  Lab 12/03/19 1001  INR 1.3*   Cardiac Enzymes: No results for input(s): CKTOTAL, CKMB, CKMBINDEX, TROPONINI in the last 168 hours. BNP (last 3 results) No results for input(s): PROBNP in the last 8760 hours. HbA1C: No results for input(s):  HGBA1C in the last 72 hours. CBG: Recent Labs  Lab 12/04/19 0010  GLUCAP 140*   Lipid Profile: No results for input(s): CHOL, HDL, LDLCALC, TRIG, CHOLHDL, LDLDIRECT in the last 72 hours. Thyroid Function Tests: No results for input(s): TSH, T4TOTAL, FREET4, T3FREE, THYROIDAB in the last 72 hours. Anemia Panel: Recent Labs    12/06/19 1100 12/07/19 0312  FERRITIN 572* 449*   Sepsis Labs: Recent Labs  Lab 12/02/19 1529 12/03/19 0008 12/03/19 0635 12/03/19 1001  PROCALCITON  --   --  0.21  --   LATICACIDVEN 2.1* 5.5*  --  1.7    Recent Results (from the past 240 hour(s))  Culture, blood (routine x 2)     Status: None   Collection Time: 12/02/19  2:16 PM   Specimen: BLOOD  Result Value Ref Range Status   Specimen Description BLOOD LEFT ANTECUBITAL  Final   Special Requests   Final    BOTTLES DRAWN AEROBIC AND ANAEROBIC Blood Culture results may not be optimal due to an inadequate volume of blood received in culture bottles   Culture   Final    NO GROWTH 5 DAYS Performed at [facility], [place] [address] [place], [place] [address]    Report Status 12/07/2019 FINAL  Final  Culture, blood (routine x 2)     Status: None   Collection Time: 12/02/19  2:21 PM   Specimen: BLOOD  Result Value Ref Range Status   Specimen Description BLOOD BLOOD RIGHT WRIST  Final   Special Requests   Final    BOTTLES DRAWN AEROBIC AND ANAEROBIC Blood Culture results may not be optimal due to an inadequate volume of blood received in culture bottles   Culture   Final    NO GROWTH 5 DAYS Performed at [facility], [place] [address], [place],  [address]    Report Status 12/07/2019 FINAL  Final  Urine culture     Status: None   Collection Time: 12/02/19  7:05 PM   Specimen: Urine, Random  Result Value Ref Range Status   Specimen Description URINE, RANDOM  Final   Special Requests NONE  Final   Culture   Final    NO GROWTH Performed at [facility], [place] [address] [place], [place] [address]    Report Status 12/03/2019 FINAL  Final  Respiratory Panel by RT PCR (Flu Keefe Zawistowski&B, Covid) - Nasopharyngeal Swab     Status: Abnormal   Collection Time: 12/02/19  7:05 PM   Specimen: Nasopharyngeal Swab  Result Value Ref Range Status   SARS Coronavirus 2 by RT PCR POSITIVE (Melodie Ashworth) NEGATIVE Final    Comment: RESULT CALLED TO, READ BACK BY AND VERIFIED WITH: Pecola Lawless RN 12/02/19 2024 JDW (NOTE) SARS-CoV-2 target nucleic acids are DETECTED. SARS-CoV-2 RNA is generally detectable in upper respiratory specimens  during the acute phase of infection. Positive results are indicative of the presence of the identified virus, but do not rule out bacterial infection or co-infection with other pathogens not detected by the test. Clinical correlation with patient history and other diagnostic information is necessary to determine patient infection status. The expected result is Negative. Fact Sheet for Patients:  PinkCheek.be Fact Sheet for Healthcare Providers: GravelBags.it This test is not yet approved or cleared by the [place] FDA and  has been authorized for detection and/or diagnosis of SARS-CoV-2 by FDA under an Emergency Use Authorization (EUA).  This EUA will remain in effect (meaning this test can be used) for t he duration of  the COVID-19 declaration under Section 564(b)(1) of the Act, 21 U.S.C. section 360bbb-3(b)(1), unless the authorization is terminated or revoked sooner.    Influenza Aamani Moose by PCR NEGATIVE NEGATIVE Final   Influenza B by PCR NEGATIVE NEGATIVE Final    Comment: (NOTE) The Xpert Xpress SARS-CoV-2/FLU/RSV assay is intended as an aid in  the diagnosis of influenza from Nasopharyngeal swab specimens and  should not be used as Alexzander Dolinger sole basis for treatment. Nasal washings and  aspirates are unacceptable for Xpert Xpress SARS-CoV-2/FLU/RSV  testing. Fact Sheet for  Patients: PinkCheek.be Fact Sheet for Healthcare Providers: GravelBags.it This test is not yet approved or cleared by the [place] FDA and  has been authorized for detection and/or diagnosis of SARS-CoV-2 by  FDA under an Emergency Use Authorization (EUA). This EUA will remain  in effect (meaning this test can be used) for the duration of the  Covid-19 declaration under Section 564(b)(1) of the Act, 21  U.S.C. section 360bbb-3(b)(1), unless the authorization is  terminated or revoked. Performed at [facility], [place] [address] [place], [place] [address]   MRSA PCR Screening     Status: None   Collection Time: 12/03/19  6:30 PM   Specimen: Nasopharyngeal  Result Value Ref Range Status   MRSA by PCR NEGATIVE NEGATIVE Final    Comment:        The GeneXpert MRSA Assay (FDA approved for NASAL specimens only), is one component of [facility] comprehensive MRSA colonization surveillance program. It is not intended to diagnose MRSA infection nor to guide or monitor treatment for MRSA infections. Performed at [facility], [place] [address] [place], [place] [address]          Radiology Studies: DG CHEST PORT 1 VIEW  Result Date: 12/07/2019 CLINICAL DATA:  Hypoxia. EXAM: PORTABLE CHEST 1 VIEW COMPARISON:  December 02, 2019. FINDINGS: Stable cardiomegaly. No pneumothorax or pleural effusion is noted. Status post right total shoulder arthroplasty. Increased left midlung opacity is noted with increased right basilar opacity concerning for worsening multifocal pneumonia. IMPRESSION: Increased left midlung opacity is noted with increased right basilar opacity concerning for worsening multifocal pneumonia. Electronically Signed   By: Sabino Dick  Jr M.D.   On: 12/07/2019 08:37        Scheduled Meds: . amLODipine  5 mg Oral Daily  . citalopram  10 mg Oral Daily  . donepezil  10 mg Oral QHS  . enoxaparin (LOVENOX) injection   40 mg Subcutaneous Q24H  . feeding supplement (ENSURE ENLIVE)  237 mL Oral TID BM  . mouth rinse  15 mL Mouth Rinse BID  . methylPREDNISolone (SOLU-MEDROL) injection  60 mg Intravenous Q12H  . metoprolol succinate  25 mg Oral QHS  . QUEtiapine  12.5 mg Oral QHS  . traZODone  50 mg Oral QHS  . vitamin B-12  2,000 mcg Oral Daily   Continuous Infusions: . ceFEPime (MAXIPIME) IV 2 g (12/07/19 0400)     LOS: 5 days    Time spent: over 30 min    Fayrene Helper, MD [facility] Pager AMION  If 7PM-7AM, please contact night-coverage [email or web address] Password [facility] 12/07/2019, 2:56 PM

## 2019-12-07 NOTE — Progress Notes (Signed)
Daily Progress Note   Patient Name: Shirley [name]       Date: 12/07/2019 DOB: 1934/08/25  Age: 86 y.o. MRN#: [id number] Attending Physician: Zigmund Daniel., * Primary Care Physician: Roderick Pee, MD (Inactive) Admit Date: 12/02/2019  Reason for Consultation/Follow-up: Establishing goals of care  Subjective: RN reports ongoing patient confusion and agitation requiring restraints. Only accepted Ensure today. Poor oral intake. Requiring 6L [place].  GOC:  F/u with son, Tawanna Cooler via telephone. Provided daily update including completion of remdesivir and repeat chest xray showing worsening multifocal pneumonia. Explained concern with poor nutritional status. Discussed recommendation against feeding tube placement with underlying dementia and also that she is already requiring restraints (NGT will likely cause more discomfort and agitation and require ongoing restraints). Todd states "I don't think we want to do that" in regards to placing feeding tube, but he would like to further discuss with his father and sister.   Educated on poor prognosis with worsening pneumonia, dementia, and very poor nutritional status. Introduced hospice philosophy and options. Tawanna Cooler shares his belief that hospice facility would likely be "best" but again, would like to discuss with his father and sister before making final decisions. Explained that if this was the plan, his mother would likely discharge to [facility] (the only current hospice facility accepting covid + patients).   Answered questions for son. Support provided.   Length of Stay: 5  Current Medications: Scheduled Meds:  . amLODipine  5 mg Oral Daily  . citalopram  10 mg Oral Daily  . donepezil  10 mg Oral QHS  . enoxaparin (LOVENOX) injection   40 mg Subcutaneous Q24H  . feeding supplement (ENSURE ENLIVE)  237 mL Oral TID BM  . mouth rinse  15 mL Mouth Rinse BID  . methylPREDNISolone (SOLU-MEDROL) injection  60 mg Intravenous Q12H  . metoprolol succinate  25 mg Oral QHS  . QUEtiapine  12.5 mg Oral QHS  . traZODone  50 mg Oral QHS  . vitamin B-12  2,000 mcg Oral Daily    Continuous Infusions: . ceFEPime (MAXIPIME) IV 2 g (12/07/19 1629)    PRN Meds: acetaminophen, acetaminophen  Physical Exam          Vital Signs: BP 116/63 (BP Location: Left Arm)   Pulse 77   Temp (!) 97.4  F (36.3 C) (Axillary)   Resp 20   Ht 5\' 5"  (1.651 m)   Wt 59.5 kg   SpO2 93%   BMI 21.83 kg/m  SpO2: SpO2: 93 % O2 Device: O2 Device: High Flow Nasal Cannula O2 Flow Rate: O2 Flow Rate (L/min): 6 L/min  Intake/output summary:   Intake/Output Summary (Last 24 hours) at 12/07/2019 1741 Last data filed at 12/07/2019 1558 Gross per 24 hour  Intake 515.26 ml  Output 1650 ml  Net -1134.74 ml   LBM: Last BM Date: 12/07/19 Baseline Weight: Weight: 59.5 kg Most recent weight: Weight: 59.5 kg       Palliative Assessment/Data: PPS 20%      Patient Active Problem List   Diagnosis Date Noted  . Dementia with behavioral disturbance ([place])   . Pneumonia due to COVID-19 virus 12/03/2019  . Hypernatremia   . Healthcare-associated pneumonia 12/02/2019  . Palliative care by specialist   . Goals of care, counseling/discussion   . DNR (do not resuscitate)   . Acute encephalopathy 11/23/2019  . Ascending aortic aneurysm ([place]) 11/23/2019  . Moderate dementia ([place]) 08/10/2015  . Frequent falls 05/08/2015  . Anxiety 05/08/2015  . Anxiety state, unspecified 03/06/2014  . Short-term memory loss 12/30/2013  . Lumbar degenerative disc disease 08/22/2011  . Polymyalgia rheumatica ([place]) 05/16/2008  . Essential hypertension 03/12/2008  . Arthritis, senescent 03/12/2008  . MYALGIA 03/12/2008  . OSTEOPOROSIS 03/12/2008    Palliative Care Assessment  & Plan   Patient Profile: 86 y.o. female  with past medical history of dementia, anxiety, hypertension, polymyalgia rheumatica admitted on 11/23/2019 with increased confusion and left-sided weakness. CT head, CTA head, and MRI negative for acute findings. Neurology signed off. Patient with acute metabolic encephalopathy, questionable progression of dementia and/or vitamin B12 deficiency. Gingivitis receiving Augmentin with recommendation for outpatient follow-up with dentist. Generalized deconditioning. Palliative medicine consultation for goals of care.   Patient discharged to SNF for rehab on 11/28/19. Readmission 12/02/19 for decreased oral intake, tachycardia, tachypnea, and hypoxia. Found to have sepsis secondary to COVID 19 pneumonia and possible bacterial pneumonia. Son confirms DNR/DNI but would like medical management. Patient receiving IVF, ABX, steroids, and remdesivir. Ongoing poor oral intake and agitation requiring restraints. Baseline dementia.   Assessment: Sepsis Covid Pneumonia with possible bacterial pneumonia Acute metabolic encephalopathy SVT Dementia PMR Hypernatremia  Recommendations/Plan: DNR/DNI Otherwise, continue current plan of care and medical management.  Continue encouragement of oral intake.  Remdesivir completed. Patient with worsening CXR today. Updated son via telephone. Educated on recommendation against feeding tube placement with dementia and ongoing agitation/confusion requiring restraints. Introduced hospice options and philosophy.  Son [facility]) considering comfort and hospice options. He plans to further discuss with patient's husband and daughter. Likely will not wish to place NGT for feedings. Likely will request hospice facility placement, as patient was in rehab prior to admission.   Code Status: DNR/DNI   Code Status Orders  (From admission, onward)         Start     Ordered   12/02/19 1746  Do not attempt resuscitation (DNR)  Continuous     Question Answer Comment  In the event of cardiac or respiratory ARREST Do not call a "code blue"   In the event of cardiac or respiratory ARREST Do not perform Intubation, CPR, defibrillation or ACLS   In the event of cardiac or respiratory ARREST Use medication by any route, position, wound care, and other measures to relive pain and suffering. May use oxygen, suction  and manual treatment of airway obstruction as needed for comfort.      12/02/19 1748        Code Status History    Date Active Date Inactive Code Status Order ID Comments User Context   11/27/2019 1227 11/28/2019 1623 DNR [id number]  Alita Chyle, NP Inpatient   11/24/2019 0222 11/27/2019 1226 Full Code [id number]  Briscoe Deutscher, MD Inpatient   07/04/2015 2206 07/06/2015 1833 Full Code [id number]  Therisa Doyne, MD Inpatient   10/20/2013 2100 10/22/2013 1824 Full Code [id number]  Kathlen Mody, MD Inpatient   02/28/2013 1915 03/01/2013 1324 Full Code [id number]  Cristi Loron, MD Inpatient   Advance Care Planning Activity    Advance Directive Documentation     Most Recent Value  Type of Advance Directive  Out of facility DNR (pink MOST or yellow form)  Pre-existing out of facility DNR order (yellow form or pink MOST form)  --  "MOST" Form in Place?  --      Prognosis:  Poor prognosis  Discharge Planning: To Be Determined  Care plan was discussed with RN, Dr. Lowell Guitar, son Tawanna Cooler)  Thank you for allowing the Palliative Medicine Team to assist in the care of this patient.  The above conversation was completed via telephone due to visitor restrictions during COVID-19 pandemic. Thorough chart review and discussion with multidisciplinary team was completed as part of assessment. No physical examination was performed.   Time In: 1720- Time Out: 1745 Total Time 25 Prolonged Time Billed  no      Greater than 50%  of this time was spent counseling and coordinating care related to the above assessment and  plan.  Vennie Homans, DNP, FNP-C Palliative Medicine Team  Phone: [phone] Fax: [phone]  Please contact Palliative Medicine Team phone at [phone] for questions and concerns.

## 2019-12-08 LAB — GLUCOSE, CAPILLARY
Glucose-Capillary: 184 mg/dL — ABNORMAL HIGH (ref 70–99)
Glucose-Capillary: 273 mg/dL — ABNORMAL HIGH (ref 70–99)
Glucose-Capillary: 376 mg/dL — ABNORMAL HIGH (ref 70–99)
Glucose-Capillary: 297 mg/dL — ABNORMAL HIGH (ref 70–99)

## 2019-12-08 LAB — CBC WITH DIFFERENTIAL/PLATELET
Abs Immature Granulocytes: 0.04 10*3/uL (ref 0.00–0.07)
Basophils Absolute: 0 10*3/uL (ref 0.0–0.1)
Basophils Relative: 0 %
Eosinophils Absolute: 0 10*3/uL (ref 0.0–0.5)
Eosinophils Relative: 0 %
HCT: 45.2 % (ref 36.0–46.0)
Hemoglobin: 14.5 g/dL (ref 12.0–15.0)
Immature Granulocytes: 0 %
Lymphocytes Relative: 6 %
Lymphs Abs: 0.6 10*3/uL — ABNORMAL LOW (ref 0.7–4.0)
MCH: 29 pg (ref 26.0–34.0)
MCHC: 32.1 g/dL (ref 30.0–36.0)
MCV: 90.4 fL (ref 80.0–100.0)
Monocytes Absolute: 0.4 10*3/uL (ref 0.1–1.0)
Monocytes Relative: 4 %
Neutro Abs: 8.5 10*3/uL — ABNORMAL HIGH (ref 1.7–7.7)
Neutrophils Relative %: 90 %
Platelets: 225 10*3/uL (ref 150–400)
RBC: 5 MIL/uL (ref 3.87–5.11)
RDW: 13.9 % (ref 11.5–15.5)
WBC: 9.5 10*3/uL (ref 4.0–10.5)
nRBC: 0 % (ref 0.0–0.2)

## 2019-12-08 LAB — COMPREHENSIVE METABOLIC PANEL
ALT: 76 U/L — ABNORMAL HIGH (ref 0–44)
AST: 71 U/L — ABNORMAL HIGH (ref 15–41)
Albumin: 2.6 g/dL — ABNORMAL LOW (ref 3.5–5.0)
Alkaline Phosphatase: 59 U/L (ref 38–126)
Anion gap: 13 (ref 5–15)
BUN: 39 mg/dL — ABNORMAL HIGH (ref 8–23)
CO2: 25 mmol/L (ref 22–32)
Calcium: 8.4 mg/dL — ABNORMAL LOW (ref 8.9–10.3)
Chloride: 107 mmol/L (ref 98–111)
Creatinine, Ser: 0.63 mg/dL (ref 0.44–1.00)
GFR calc Af Amer: >60 mL/min (ref >60)
GFR calc non Af Amer: >60 mL/min (ref >60)
Glucose, Bld: 276 mg/dL — ABNORMAL HIGH (ref 70–99)
Potassium: 4.2 mmol/L (ref 3.5–5.1)
Sodium: 145 mmol/L (ref 135–145)
Total Bilirubin: 1 mg/dL (ref 0.3–1.2)
Total Protein: 5.6 g/dL — ABNORMAL LOW (ref 6.5–8.1)

## 2019-12-08 LAB — MAGNESIUM: Magnesium: 2.4 mg/dL (ref 1.7–2.4)

## 2019-12-08 LAB — PHOSPHORUS: Phosphorus: 2.6 mg/dL (ref 2.5–4.6)

## 2019-12-08 LAB — D-DIMER, QUANTITATIVE: D-Dimer, Quant: 2.49 ug/mL-FEU — ABNORMAL HIGH (ref 0.00–0.50)

## 2019-12-08 LAB — FERRITIN: Ferritin: 398 ng/mL — ABNORMAL HIGH (ref 11–307)

## 2019-12-08 LAB — HEMOGLOBIN A1C
Hgb A1c MFr Bld: 6.7 % — ABNORMAL HIGH (ref 4.8–5.6)
Mean Plasma Glucose: 145.59 mg/dL

## 2019-12-08 LAB — C-REACTIVE PROTEIN: CRP: 2.3 mg/dL — ABNORMAL HIGH (ref <1.0)

## 2019-12-08 MED ORDER — INSULIN ASPART 100 UNIT/ML ~~LOC~~ SOLN
0.0000 [IU] | SUBCUTANEOUS | Status: DC
  Administered 2019-12-08: 15 [IU] via SUBCUTANEOUS
  Administered 2019-12-08: 8 [IU] via SUBCUTANEOUS
  Administered 2019-12-08: 5 [IU] via SUBCUTANEOUS
  Administered 2019-12-08: 3 [IU] via SUBCUTANEOUS
  Administered 2019-12-09: 2 [IU] via SUBCUTANEOUS
  Administered 2019-12-09 (×2): 8 [IU] via SUBCUTANEOUS
  Administered 2019-12-09: 3 [IU] via SUBCUTANEOUS

## 2019-12-08 NOTE — Progress Notes (Signed)
Nutrition Follow-up  RD working remotely.  DOCUMENTATION CODES:   Not applicable  INTERVENTION:  Continue Ensure Enlive po TID, each supplement provides 350 kcal and 20 grams of protein  NUTRITION DIAGNOSIS:   Increased nutrient needs related to acute illness(COVID-19) as evidenced by estimated needs.  Ongoing.  GOAL:   Patient will meet greater than or equal to 90% of their needs  Not met.  MONITOR:   PO intake, Supplement acceptance, Skin, Labs  REASON FOR ASSESSMENT:   Malnutrition Screening Tool, Consult Assessment of nutrition requirement/status  ASSESSMENT:   86 yo female admitted with sepsis r/t Covid PNA. PMH includes dementia, HTN, osteoporosis, polymyalgia rheumatica.  Patient was seen by RD on 12/31 for Malnutrition Screening Tool. RD now received consult for assessment of nutrition requirements/status. Patient continues to have poor PO intake. She is eating 0-20% of her meals but most are 0-10% according to chart. She is ordered for Ensure Enlive TID. Palliative Medicine has been following and discussing goals of care with patient's son. Son is considering comfort and hospice options. He would likely not with to place NGT for feedings per chart. Will continue to monitor outcome of discussions regarding goals of care.  Medications reviewed and include: Novolog 0-15 units Q4hrs, Solu-Medrol 60 mg Q12hrs IV, Seroquel, vitamin B12 2000 micrograms daily, cefepime.  Labs reviewed: CBG 184-273, BUN 39.  Diet Order:   Diet Order            DIET - DYS 1 Room service appropriate? Yes; Fluid consistency: Thin  Diet effective now             EDUCATION NEEDS:   Not appropriate for education at this time  Skin:  Skin Assessment: Reviewed RN Assessment  Last BM:  12/31 type 5  Height:   Ht Readings from Last 1 Encounters:  12/02/19 '5\' 5"'  (1.651 m)   Weight:   Wt Readings from Last 1 Encounters:  12/02/19 59.5 kg   Ideal Body Weight:  56.8 kg  BMI:   Body mass index is 21.83 kg/m.  Estimated Nutritional Needs:   Kcal:  1700-1900  Protein:  85-95 gm  Fluid:  >/= 1.7 L  Jacklynn Barnacle, MS, RD, LDN Office: [phone] Pager: [phone] After Hours/Weekend Pager: [phone]

## 2019-12-08 NOTE — Progress Notes (Addendum)
PROGRESS NOTE    Shirley [name]  FYB:[id number] DOB: 29-May-1934 DOA: 12/02/2019 PCP: Dorena Cookey, MD (Inactive)   Brief Narrative: HPI per Dr. Doretha Imus Bloweis Rusty [name] 86 y.o.femalewith medical history significant ofdementia; HTN; PMR; and recent hospitalization presenting with code sepsis. She was previously hospitalized from 12/19-24 with acute metabolic encephalopathy and fever of uncertain source. MOST form was completed with DNR/DNI; limited additional interventions including rehospitalization, limited abx, IVF, and time trial feeding tube after discussion with family. She had poor PO intake, tachycardia, tachypnea at her facility with O2 sats 80s, improved to 96% with 3L [name]. GCS 9 in the field, improved with IVF bolus. The facility told her son the HR was increased, and this was the same reason she was here last time. Family has not seen her in [name] long time. Her son had [name] double lung transplant on Feb 9, last saw her Feb 8 prior to last hospitalization. He recognizes that her dementia has gotten worse over the last 10 months. At the time of last d/c, she was talking Shirley [name] lot but all over the place, alert. She alsoslept Shirley [name] lot while she was here last time.   ED Course:Recently admitted. Called code stroke, not code stroke. Dental infection, given Augmentin and sent back. Sats 80, HR 120, appears to have PNA. Previously with palliative care consult, limited care. Temp 100.3.  Assessment & Plan:   Principal Problem:   Healthcare-associated pneumonia Active Problems:   Essential hypertension   Polymyalgia rheumatica (HCC)   Moderate dementia ([place])   Acute encephalopathy   Goals of care, counseling/discussion   DNR (do not resuscitate)   Pneumonia due to COVID-19 virus   Hypernatremia   Dementia with behavioral disturbance ([place])  # Goals of care/DNR Patient noted to have Shirley [name] poor overall prognosis and palliative care consulted during last hospitalization.   Patient made Shirley [name] DNR and MOST form was completed at that time.  Plan for this admission is to see how she does with care noted below, if she fails to improve or worsens, consider comfort care.  She continues to be confused today, Shirley [name] little better today than yesterday, eating very little, but more than yesterday, oxygen requirement has decreased - will continue current plan of care and follow.  Will continue to discuss POC with son/palliative care.   1 Sepsis secondary to Covid Pneumonia  Possible Bacterial Pneumonia  Patient with recent hospitalization discharged on 11/28/2019 and had presented with fever and thought to be related to Shirley [name] dental infection and discharged back to assisted living facility with Augmentin.   Patient presented back with decreased oral intake, tachycardia, tachypnea noted to be hypoxic which improved on O2.   Pt met criteria for sepsis on admission Was on 4 L when I was at bedside this morning CXR from 12/28 with airspace opacity on R in RLL region concerning for pneumonia CXR 1/02 with worsening multifocal pneumonia Urine cx no growth, blood cx NGTD x5 Negative MRSA PCR, will d/c vancomycin Cefepime x 7 days Continue remdesivir and solumedrol   COVID-19 Labs  Recent Labs    12/06/19 1100 12/07/19 0312 12/08/19 0148 12/08/19 0500  DDIMER 2.70* 2.11* 2.49*  --   FERRITIN 572* 449*  --  398*  CRP 4.4* 3.3*  --  2.3*    Lab Results  Component Value Date   SARSCOV2NAA POSITIVE ([name]) 12/02/2019   [place] NEGATIVE 11/27/2019   [place] NEGATIVE 11/23/2019    2.  Acute metabolic encephalopathy Likely  secondary to problem #1 in the setting of underlying dementia and dehydration.  She continues to be confused.  Confusion slightly improved today.  Trial of seroquel  # Superventricular Tachycardia: 2 runs 12/31 AM.  Restart metoprolol.    3.  Moderate dementia resume Aricept.  Continue to hold Celexa for now.  4.  Hypertension Metoprolol  5.   PMR Patient currently on IV steroids secondary to problem #1.  Outpatient follow-up.  6.  Hypernatremia Improved, follow off IVF  # Poor PO intake: encourage PO, follow closely.  Ate Shirley [name] little more today than yesterday.  # Elevated LFT's: continue to monitor, rising.  Follow acute hepatitis panel.   DVT prophylaxis: lovenox Code Status: DNR Family Communication: none at bedside - discussed with son 12/08/19 Disposition Plan: pending  Consultants:   Palliative care  Procedures:   none  Antimicrobials:  Anti-infectives (From admission, onward)   Start     Dose/Rate Route Frequency Ordered Stop   12/04/19 1400  vancomycin (VANCOREADY) IVPB 1250 mg/250 mL  Status:  Discontinued     1,250 mg 166.7 mL/hr over 90 Minutes Intravenous Every 48 hours 12/02/19 1555 12/04/19 1232   12/04/19 1000  remdesivir 100 mg in sodium chloride 0.9 % 100 mL IVPB     100 mg 200 mL/hr over 30 Minutes Intravenous Daily 12/03/19 0912 12/07/19 1041   12/03/19 0915  remdesivir 200 mg in sodium chloride 0.9% 250 mL IVPB     200 mg 580 mL/hr over 30 Minutes Intravenous Once 12/03/19 0912 12/03/19 1117   12/03/19 0500  ceFEPIme (MAXIPIME) 2 g in sodium chloride 0.9 % 100 mL IVPB     2 g 200 mL/hr over 30 Minutes Intravenous Every 12 hours 12/02/19 1555 12/10/19 0459   12/02/19 1530  ceFEPIme (MAXIPIME) 2 g in sodium chloride 0.9 % 100 mL IVPB     2 g 200 mL/hr over 30 Minutes Intravenous  Once 12/02/19 1523 12/02/19 1758   12/02/19 1530  vancomycin (VANCOCIN) IVPB 1000 mg/200 mL premix     1,000 mg 200 mL/hr over 60 Minutes Intravenous  Once 12/02/19 1523 12/02/19 1758     Subjective: Confused, but more talkative today Answering some questions  Objective: Vitals:   12/08/19 0538 12/08/19 0805 12/08/19 1013 12/08/19 1142  BP: (!) 168/66 (!) 171/77 (!) 154/68   Pulse: 60 60  76  Resp:  (!) '21 19 20  ' Temp: 98 F (36.7 C)   97.6 F (36.4 C)  TempSrc: Axillary   Axillary  SpO2: 93% 91%  91%   Weight:      Height:        Intake/Output Summary (Last 24 hours) at 12/08/2019 1436 Last data filed at 12/08/2019 1400 Gross per 24 hour  Intake 777 ml  Output 1200 ml  Net -423 ml   Filed Weights   12/02/19 1500  Weight: 59.5 kg    Examination:  General: No acute distress. Cardiovascular:RRR Lungs: Clear to auscultation bilaterally Abdomen: Soft, nontender, nondistended  Neurological: Alert and oriented 1, confused, responds inappropriately with nonsensical speech. Moves all extremities 4 . Cranial nerves II through XII grossly intact. Skin: Warm and dry. No rashes or lesions. Extremities: No clubbing or cyanosis. No edema.   Data Reviewed: I have personally reviewed following labs and imaging studies  CBC: Recent Labs  Lab 12/03/19 0044 12/04/19 0825 12/05/19 0105 12/06/19 1100 12/07/19 0312 12/08/19 0148  WBC 13.7* 7.5 7.8 11.8* 10.4 9.5  NEUTROABS 11.6*  --  6.5 10.0*  8.9* 8.5*  HGB 17.3* 14.5 14.0 15.3* 14.5 14.5  HCT 56.2* 45.7 42.6 46.9* 44.6 45.2  MCV 97.2 93.3 91.4 91.2 89.9 90.4  PLT 137* 156 157 203 211 038   Basic Metabolic Panel: Recent Labs  Lab 12/04/19 0825 12/05/19 0105 12/06/19 1100 12/07/19 0312 12/08/19 0148  NA 141 142 145 142 145  K 3.7 3.5 3.6 3.5 4.2  CL 110 110 108 104 107  CO2 21* 21* '26 25 25  ' GLUCOSE 196* 138* 168* 207* 276*  BUN 33* 31* 31* 31* 39*  CREATININE 0.61 0.55 0.79 0.63 0.63  CALCIUM 7.8* 8.4* 8.5* 8.3* 8.4*  MG  --  2.1 2.1 2.2 2.4  PHOS  --  2.3* 2.7 2.6 2.6   GFR: Estimated Creatinine Clearance: 47.9 mL/min (by C-G formula based on SCr of 0.63 mg/dL). Liver Function Tests: Recent Labs  Lab 12/04/19 0825 12/05/19 0105 12/06/19 1100 12/07/19 0312 12/08/19 0148  AST 34 38 85* 114* 71*  ALT 17 21 45* 78* 76*  ALKPHOS 36* 39 51 55 59  BILITOT 0.7 0.9 0.6 0.7 1.0  PROT 5.3* 5.3* 6.0* 5.6* 5.6*  ALBUMIN 2.2* 2.4* 2.6* 2.6* 2.6*   No results for input(s): LIPASE, AMYLASE in the last 168 hours. No  results for input(s): AMMONIA in the last 168 hours. Coagulation Profile: Recent Labs  Lab 12/03/19 1001  INR 1.3*   Cardiac Enzymes: No results for input(s): CKTOTAL, CKMB, CKMBINDEX, TROPONINI in the last 168 hours. BNP (last 3 results) No results for input(s): PROBNP in the last 8760 hours. HbA1C: Recent Labs    12/08/19 0143  HGBA1C 6.7*   CBG: Recent Labs  Lab 12/04/19 0010 12/08/19 0831 12/08/19 1146  GLUCAP 140* 184* 273*   Lipid Profile: No results for input(s): CHOL, HDL, LDLCALC, TRIG, CHOLHDL, LDLDIRECT in the last 72 hours. Thyroid Function Tests: No results for input(s): TSH, T4TOTAL, FREET4, T3FREE, THYROIDAB in the last 72 hours. Anemia Panel: Recent Labs    12/07/19 0312 12/08/19 0500  FERRITIN 449* 398*   Sepsis Labs: Recent Labs  Lab 12/02/19 1529 12/03/19 0008 12/03/19 0635 12/03/19 1001  PROCALCITON  --   --  0.21  --   LATICACIDVEN 2.1* 5.5*  --  1.7    Recent Results (from the past 240 hour(s))  Culture, blood (routine x 2)     Status: None   Collection Time: 12/02/19  2:16 PM   Specimen: BLOOD  Result Value Ref Range Status   Specimen Description BLOOD LEFT ANTECUBITAL  Final   Special Requests   Final    BOTTLES DRAWN AEROBIC AND ANAEROBIC Blood Culture results may not be optimal due to an inadequate volume of blood received in culture bottles   Culture   Final    NO GROWTH 5 DAYS Performed at [facility], [place] [address], [place], [place] [address]    Report Status 12/07/2019 FINAL  Final  Culture, blood (routine x 2)     Status: None   Collection Time: 12/02/19  2:21 PM   Specimen: BLOOD  Result Value Ref Range Status   Specimen Description BLOOD BLOOD RIGHT WRIST  Final   Special Requests   Final    BOTTLES DRAWN AEROBIC AND ANAEROBIC Blood Culture results may not be optimal due to an inadequate volume of blood received in culture bottles   Culture   Final    NO GROWTH 5 DAYS Performed at [facility],  [place],  [place] [address]    Report Status 12/07/2019 FINAL  Final  Urine culture     Status: None   Collection Time: 12/02/19  7:05 PM   Specimen: Urine, Random  Result Value Ref Range Status   Specimen Description URINE, RANDOM  Final   Special Requests NONE  Final   Culture   Final    NO GROWTH Performed at [facility], [place] [address], [place], [place] [address]    Report Status 12/03/2019 FINAL  Final  Respiratory Panel by RT PCR (Flu Catrinia Racicot&B, Covid) - Nasopharyngeal Swab     Status: Abnormal   Collection Time: 12/02/19  7:05 PM   Specimen: Nasopharyngeal Swab  Result Value Ref Range Status   SARS Coronavirus 2 by RT PCR POSITIVE (Darius Lundberg) NEGATIVE Final    Comment: RESULT CALLED TO, READ BACK BY AND VERIFIED WITH: Pecola Lawless RN 12/02/19 2024 JDW (NOTE) SARS-CoV-2 target nucleic acids are DETECTED. SARS-CoV-2 RNA is generally detectable in upper respiratory specimens  during the acute phase of infection. Positive results are indicative of the presence of the identified virus, but do not rule out bacterial infection or co-infection with other pathogens not detected by the test. Clinical correlation with patient history and other diagnostic information is necessary to determine patient infection status. The expected result is Negative. Fact Sheet for Patients:  PinkCheek.be Fact Sheet for Healthcare Providers: GravelBags.it This test is not yet approved or cleared by the [place] FDA and  has been authorized for detection and/or diagnosis of SARS-CoV-2 by FDA under an Emergency Use Authorization (EUA).  This EUA will remain in effect (meaning this test can be used) for t he duration of  the COVID-19 declaration under Section 564(b)(1) of the Act, 21 U.S.C. section 360bbb-3(b)(1), unless the authorization is terminated or revoked sooner.    Influenza Patrice Matthew by PCR NEGATIVE NEGATIVE Final   Influenza B by PCR  NEGATIVE NEGATIVE Final    Comment: (NOTE) The Xpert Xpress SARS-CoV-2/FLU/RSV assay is intended as an aid in  the diagnosis of influenza from Nasopharyngeal swab specimens and  should not be used as Jamaal Bernasconi sole basis for treatment. Nasal washings and  aspirates are unacceptable for Xpert Xpress SARS-CoV-2/FLU/RSV  testing. Fact Sheet for Patients: PinkCheek.be Fact Sheet for Healthcare Providers: GravelBags.it This test is not yet approved or cleared by the [place] FDA and  has been authorized for detection and/or diagnosis of SARS-CoV-2 by  FDA under an Emergency Use Authorization (EUA). This EUA will remain  in effect (meaning this test can be used) for the duration of the  Covid-19 declaration under Section 564(b)(1) of the Act, 21  U.S.C. section 360bbb-3(b)(1), unless the authorization is  terminated or revoked. Performed at [facility], [place] [address], [place], [place] [address]   MRSA PCR Screening     Status: None   Collection Time: 12/03/19  6:30 PM   Specimen: Nasopharyngeal  Result Value Ref Range Status   MRSA by PCR NEGATIVE NEGATIVE Final    Comment:        The GeneXpert MRSA Assay (FDA approved for NASAL specimens only), is one component of Lake[facility] Fontanilla comprehensive MRSA colonization surveillance program. It is not intended to diagnose MRSA infection nor to guide or monitor treatment for MRSA infections. Performed at [facility], [place] [address] [place], [place] [address]          Radiology Studies: DG CHEST PORT 1 VIEW  Result Date: 12/07/2019 CLINICAL DATA:  Hypoxia.  EXAM: PORTABLE CHEST 1 VIEW COMPARISON:  December 02, 2019. FINDINGS: Stable cardiomegaly. No pneumothorax or pleural effusion is noted. Status post right total shoulder arthroplasty. Increased left midlung opacity is noted with increased right basilar opacity concerning for worsening multifocal pneumonia. IMPRESSION: Increased  left midlung opacity is noted with increased right basilar opacity concerning for worsening multifocal pneumonia. Electronically Signed   By: Marijo Conception M.D.   On: 12/07/2019 08:37        Scheduled Meds: . amLODipine  5 mg Oral Daily  . citalopram  10 mg Oral Daily  . donepezil  10 mg Oral QHS  . enoxaparin (LOVENOX) injection  40 mg Subcutaneous Q24H  . feeding supplement (ENSURE ENLIVE)  237 mL Oral TID BM  . insulin aspart  0-15 Units Subcutaneous Q4H  . mouth rinse  15 mL Mouth Rinse BID  . methylPREDNISolone (SOLU-MEDROL) injection  60 mg Intravenous Q12H  . metoprolol succinate  25 mg Oral QHS  . QUEtiapine  12.5 mg Oral QHS  . vitamin B-12  2,000 mcg Oral Daily   Continuous Infusions: . ceFEPime (MAXIPIME) IV 2 g (12/08/19 0530)     LOS: 6 days    Time spent: over 30 min    Fayrene Helper, MD [facility] Pager AMION  If 7PM-7AM, please contact night-coverage [email or web address] Password [facility] 12/08/2019, 2:36 PM

## 2019-12-09 LAB — COMPREHENSIVE METABOLIC PANEL
ALT: 63 U/L — ABNORMAL HIGH (ref 0–44)
AST: 39 U/L (ref 15–41)
Albumin: 2.5 g/dL — ABNORMAL LOW (ref 3.5–5.0)
Alkaline Phosphatase: 58 U/L (ref 38–126)
Anion gap: 10 (ref 5–15)
BUN: 44 mg/dL — ABNORMAL HIGH (ref 8–23)
CO2: 27 mmol/L (ref 22–32)
Calcium: 8.7 mg/dL — ABNORMAL LOW (ref 8.9–10.3)
Chloride: 111 mmol/L (ref 98–111)
Creatinine, Ser: 0.69 mg/dL (ref 0.44–1.00)
GFR calc Af Amer: >60 mL/min (ref >60)
GFR calc non Af Amer: >60 mL/min (ref >60)
Glucose, Bld: 137 mg/dL — ABNORMAL HIGH (ref 70–99)
Potassium: 3.9 mmol/L (ref 3.5–5.1)
Sodium: 148 mmol/L — ABNORMAL HIGH (ref 135–145)
Total Bilirubin: 0.5 mg/dL (ref 0.3–1.2)
Total Protein: 5.6 g/dL — ABNORMAL LOW (ref 6.5–8.1)

## 2019-12-09 LAB — CBC WITH DIFFERENTIAL/PLATELET
Abs Immature Granulocytes: 0.1 10*3/uL — ABNORMAL HIGH (ref 0.00–0.07)
Basophils Absolute: 0 10*3/uL (ref 0.0–0.1)
Basophils Relative: 0 %
Eosinophils Absolute: 0 10*3/uL (ref 0.0–0.5)
Eosinophils Relative: 0 %
HCT: 43.1 % (ref 36.0–46.0)
Hemoglobin: 14.2 g/dL (ref 12.0–15.0)
Immature Granulocytes: 1 %
Lymphocytes Relative: 5 %
Lymphs Abs: 0.7 10*3/uL (ref 0.7–4.0)
MCH: 30 pg (ref 26.0–34.0)
MCHC: 32.9 g/dL (ref 30.0–36.0)
MCV: 91.1 fL (ref 80.0–100.0)
Monocytes Absolute: 0.7 10*3/uL (ref 0.1–1.0)
Monocytes Relative: 6 %
Neutro Abs: 11.7 10*3/uL — ABNORMAL HIGH (ref 1.7–7.7)
Neutrophils Relative %: 88 %
Platelets: 236 10*3/uL (ref 150–400)
RBC: 4.73 MIL/uL (ref 3.87–5.11)
RDW: 13.8 % (ref 11.5–15.5)
WBC: 13.3 10*3/uL — ABNORMAL HIGH (ref 4.0–10.5)
nRBC: 0 % (ref 0.0–0.2)

## 2019-12-09 LAB — GLUCOSE, CAPILLARY
Glucose-Capillary: 120 mg/dL — ABNORMAL HIGH (ref 70–99)
Glucose-Capillary: 129 mg/dL — ABNORMAL HIGH (ref 70–99)
Glucose-Capillary: 168 mg/dL — ABNORMAL HIGH (ref 70–99)
Glucose-Capillary: 294 mg/dL — ABNORMAL HIGH (ref 70–99)
Glucose-Capillary: 298 mg/dL — ABNORMAL HIGH (ref 70–99)
Glucose-Capillary: 335 mg/dL — ABNORMAL HIGH (ref 70–99)

## 2019-12-09 LAB — D-DIMER, QUANTITATIVE: D-Dimer, Quant: 2.45 ug/mL-FEU — ABNORMAL HIGH (ref 0.00–0.50)

## 2019-12-09 LAB — PHOSPHORUS: Phosphorus: 1.9 mg/dL — ABNORMAL LOW (ref 2.5–4.6)

## 2019-12-09 LAB — FERRITIN: Ferritin: 354 ng/mL — ABNORMAL HIGH (ref 11–307)

## 2019-12-09 LAB — MAGNESIUM: Magnesium: 2.2 mg/dL (ref 1.7–2.4)

## 2019-12-09 LAB — C-REACTIVE PROTEIN: CRP: 1.3 mg/dL — ABNORMAL HIGH (ref <1.0)

## 2019-12-09 MED ORDER — DEXAMETHASONE SODIUM PHOSPHATE 10 MG/ML IJ SOLN
6.0000 mg | INTRAMUSCULAR | Status: AC
  Administered 2019-12-10 – 2019-12-12 (×3): 6 mg via INTRAVENOUS
  Filled 2019-12-09 (×3): qty 1

## 2019-12-09 MED ORDER — INSULIN ASPART 100 UNIT/ML ~~LOC~~ SOLN
0.0000 [IU] | SUBCUTANEOUS | Status: DC
  Administered 2019-12-09: 15 [IU] via SUBCUTANEOUS
  Administered 2019-12-10 (×2): 4 [IU] via SUBCUTANEOUS
  Administered 2019-12-10: 3 [IU] via SUBCUTANEOUS
  Administered 2019-12-10: 11 [IU] via SUBCUTANEOUS
  Administered 2019-12-10 – 2019-12-11 (×2): 4 [IU] via SUBCUTANEOUS
  Administered 2019-12-11: 7 [IU] via SUBCUTANEOUS
  Administered 2019-12-11: 11 [IU] via SUBCUTANEOUS
  Administered 2019-12-11: 3 [IU] via SUBCUTANEOUS
  Administered 2019-12-12: 7 [IU] via SUBCUTANEOUS
  Administered 2019-12-12 – 2019-12-13 (×3): 3 [IU] via SUBCUTANEOUS

## 2019-12-09 MED ORDER — DEXTROSE 5 % IV SOLN: INTRAVENOUS | Status: DC

## 2019-12-09 NOTE — Progress Notes (Signed)
Tried to reach patients son, Thereasa Distance, by phone; however, was unsuccessful. No voicemail to leave message. Will try again at a later time.

## 2019-12-09 NOTE — Progress Notes (Signed)
  Speech Language Pathology Treatment: Dysphagia  Patient Details Name: Shirley [name] MRN: [id number] DOB: 12-02-1934 Today's Date: 12/09/2019 Time: 1638-4536 SLP Time Calculation (min) (ACUTE ONLY): 14 min  Assessment / Plan / Recommendation Clinical Impression  Pt alert, says she does not feel well but unable to provide details.  Saturating ~88% on room air. Approximately 40% of meal eaten from breakfast tray.  HOB elevated and reassessed swallow to determine ability to advance diet. Pt accepted offering of spoon but with marginal effort, appeared fatigued after five boluses of puree, with intermittent oral holding of pudding followed by eventual swallow.  Consumed multiple sips of liquids from a straw with no indication of aspiration.  Dysphagia 1, thin liquids is likely the safest diet for now.  If pt D/Cs to a SNF or residential hospice, she can be evaluated at that level of care to determine most appropriate diet given the circumstances.  No further acute care SLP f/u is needed.  Our service will sign off.    HPI HPI: 86 y.o. female with medical history significant for dementia, anxiety, hypertension, and polymyalgia rheumatica. Recent hospitalization due to confusion/weakness, stroke ruled out, discharged 12/24. On 12/28 brought to ED with concern for sepsis, fouind to have pna and positive Covid test 12/28. BSE 12/22 rec'd Dys 2/thin- no s/s aspiration and cognitive based. ST discharged 12/23 continuing on Dys 2/thin. Palliative care consulted that admission with overall poor prognosis. Son desired to try antibiotics for 24-48 hours and monitor prior to comfort measures.        SLP Plan  All goals met       Recommendations  Diet recommendations: Dysphagia 1 (puree);Thin liquid Liquids provided via: Cup;Straw Medication Administration: Crushed with puree Supervision: Staff to assist with self feeding Compensations: Slow rate;Small sips/bites Postural Changes and/or Swallow Maneuvers:  Seated upright 90 degrees                Oral Care Recommendations: Oral care BID Follow up Recommendations: Skilled Nursing facility SLP Visit Diagnosis: Dysphagia, unspecified (R13.10) Plan: All goals met       GO                Juan Quam Laurice 12/09/2019, 10:59 AM  Estill Bamberg L. Tivis Ringer, [place] Office number [phone] Pager [phone]

## 2019-12-09 NOTE — Progress Notes (Addendum)
PROGRESS NOTE    ADRYANNA FRIEDT  EQA:[id number] DOB: Jan 24, 1934 DOA: 12/02/2019 PCP: Dorena Cookey, MD (Inactive)   Brief Narrative: HPI per Dr. Doretha Imus Bloweis Rosabell Geyer 86 y.o.femalewith medical history significant ofdementia; HTN; PMR; and recent hospitalization presenting with code sepsis. She was previously hospitalized from 12/19-24 with acute metabolic encephalopathy and fever of uncertain source. MOST form was completed with DNR/DNI; limited additional interventions including rehospitalization, limited abx, IVF, and time trial feeding tube after discussion with family. She had poor PO intake, tachycardia, tachypnea at her facility with O2 sats 80s, improved to 96% with 3L [place] O2. GCS 9 in the field, improved with IVF bolus. The facility told her son the HR was increased, and this was the same reason she was here last time. Family has not seen her in Navya Timmons long time. Her son had Melody Cirrincione double lung transplant on Feb 9, last saw her Feb 8 prior to last hospitalization. He recognizes that her dementia has gotten worse over the last 10 months. At the time of last d/c, she was talking Jimeka Balan lot but all over the place, alert. She alsoslept Tra Wilemon lot while she was here last time.   ED Course:Recently admitted. Called code stroke, not code stroke. Dental infection, given Augmentin and sent back. Sats 80, HR 120, appears to have PNA. Previously with palliative care consult, limited care. Temp 100.3.  Assessment & Plan:   Principal Problem:   Healthcare-associated pneumonia Active Problems:   Essential hypertension   Polymyalgia rheumatica (HCC)   Moderate dementia ([place])   Acute encephalopathy   Goals of care, counseling/discussion   DNR (do not resuscitate)   Pneumonia due to COVID-19 virus   Hypernatremia   Dementia with behavioral disturbance ([place])  # Goals of care/DNR Patient noted to have Erline Siddoway poor overall prognosis and palliative care consulted during last hospitalization.   Patient made Rik Wadel DNR and MOST form was completed at that time.  Plan for this admission is to see how she does with care noted below, if she fails to improve or worsens, consider comfort care.  She continues to be confused today.  She's eating Aunna Snooks bit more today and is on room air.  She's had incremental improvements daily, still not taking great PO, but ate about 40% of breakfast and 20% of lunch.  From respiratory standpoint she's improved enough to be discharge, her decreased PO intake is concerning, but has improved Derionna Salvador bit over the past 2 days.  Will need to pay close attention to this going forward.  I think her going with palliative care vs hospice would be appropriate.  Will continue to discuss POC with son/palliative care.   1 Sepsis secondary to Covid Pneumonia  Possible Bacterial Pneumonia  Patient with recent hospitalization discharged on 11/28/2019 and had presented with fever and thought to be related to Jaun Galluzzo dental infection and discharged back to assisted living facility with Augmentin.   Patient presented back with decreased oral intake, tachycardia, tachypnea noted to be hypoxic which improved on O2.   Pt met criteria for sepsis on admission On RA today CXR from 12/28 with airspace opacity on R in RLL region concerning for pneumonia CXR 1/02 with worsening multifocal pneumonia Urine cx no growth, blood cx NGTD x5 Negative MRSA PCR, will d/c vancomycin Cefepime x 7 days Continue remdesivir and solumedrol   COVID-19 Labs  Recent Labs    12/07/19 0312 12/08/19 0148 12/08/19 0500 12/09/19 0210  DDIMER 2.11* 2.49*  --  2.45*  FERRITIN 449*  --  398* 354*  CRP 3.3*  --  2.3* 1.3*    Lab Results  Component Value Date   SARSCOV2NAA POSITIVE ([facility]) 12/02/2019   [place] NEGATIVE 11/27/2019   [place] NEGATIVE 11/23/2019    2.  Acute metabolic encephalopathy Likely secondary to problem #1 in the setting of underlying dementia and dehydration.  She continues to be confused.   Confusion slightly improved today.  Trial of seroquel  # Superventricular Tachycardia: 2 runs 12/31 AM.  Restart metoprolol.    3.  Moderate dementia resume Aricept.  Continue to hold Celexa for now.  4.  Hypertension Metoprolol  5.  PMR Patient currently on IV steroids secondary to problem #1.  Outpatient follow-up.  6.  Hypernatremia Improved, follow off IVF  # Poor PO intake: encourage PO, follow closely.  Slightly improved today.   # Elevated LFT's: continue to monitor, improving.  Follow acute hepatitis panel (negative).   # Hypernatremia: give D5, follow   DVT prophylaxis: lovenox Code Status: DNR Family Communication: none at bedside - discussed with son 12/09/19 Disposition Plan: pending  Consultants:   Palliative care  Procedures:   none  Antimicrobials:  Anti-infectives (From admission, onward)   Start     Dose/Rate Route Frequency Ordered Stop   12/04/19 1400  vancomycin (VANCOREADY) IVPB 1250 mg/250 mL  Status:  Discontinued     1,250 mg 166.7 mL/hr over 90 Minutes Intravenous Every 48 hours 12/02/19 1555 12/04/19 1232   12/04/19 1000  remdesivir 100 mg in sodium chloride 0.9 % 100 mL IVPB     100 mg 200 mL/hr over 30 Minutes Intravenous Daily 12/03/19 0912 12/07/19 1041   12/03/19 0915  remdesivir 200 mg in sodium chloride 0.9% 250 mL IVPB     200 mg 580 mL/hr over 30 Minutes Intravenous Once 12/03/19 0912 12/03/19 1117   12/03/19 0500  ceFEPIme (MAXIPIME) 2 g in sodium chloride 0.9 % 100 mL IVPB     2 g 200 mL/hr over 30 Minutes Intravenous Every 12 hours 12/02/19 1555 12/09/19 1757   12/02/19 1530  ceFEPIme (MAXIPIME) 2 g in sodium chloride 0.9 % 100 mL IVPB     2 g 200 mL/hr over 30 Minutes Intravenous  Once 12/02/19 1523 12/02/19 1758   12/02/19 1530  vancomycin (VANCOCIN) IVPB 1000 mg/200 mL premix     1,000 mg 200 mL/hr over 60 Minutes Intravenous  Once 12/02/19 1523 12/02/19 1758     Subjective: Continues to be confused, nonsensical  speech, but responds when spoken to  Objective: Vitals:   12/09/19 0757 12/09/19 0932 12/09/19 1145 12/09/19 1608  BP: (!) 153/94  (!) 158/71 132/83  Pulse: 75  66 85  Resp: _0 Temp: (!) 97.5 F (36.4 C)  97.7 F (36.5 C) 98.8 F (37.1 C)  TempSrc: Axillary  Axillary Axillary  SpO2: 94% 91% (!) 89% 90%  Weight:      Height:        Intake/Output Summary (Last 24 hours) at 12/09/2019 1814 Last data filed at 12/09/2019 1302 Gross per 24 hour  Intake 625 ml  Output 1200 ml  Net -575 ml   Filed Weights   12/02/19 1500  Weight: 59.5 kg    Examination:  General: No acute distress. Cardiovascular: Heart sounds show Advith Martine regular rate, and rhythm. Lungs: Clear to auscultation bilaterally  Abdomen: Soft, nontender, nondistended  Neurological: Alert and oriented 1. Moves all extremities 4. Cranial nerves II through XII grossly intact.  Skin: Warm and dry. No rashes or lesions. Extremities: No clubbing or cyanosis. No edema.   Data Reviewed: I have personally reviewed following labs and imaging studies  CBC: Recent Labs  Lab 12/05/19 0105 12/06/19 1100 12/07/19 0312 12/08/19 0148 12/09/19 0210  WBC 7.8 11.8* 10.4 9.5 13.3*  NEUTROABS 6.5 10.0* 8.9* 8.5* 11.7*  HGB 14.0 15.3* 14.5 14.5 14.2  HCT 42.6 46.9* 44.6 45.2 43.1  MCV 91.4 91.2 89.9 90.4 91.1  PLT 157 203 211 225 226   Basic Metabolic Panel: Recent Labs  Lab 12/05/19 0105 12/06/19 1100 12/07/19 0312 12/08/19 0148 12/09/19 0210  NA 142 145 142 145 148*  K 3.5 3.6 3.5 4.2 3.9  CL 110 108 104 107 111  CO2 21* _0 GLUCOSE 138* 168* 207* 276* 137*  BUN 31* 31* 31* 39* 44*  CREATININE 0.55 0.79 0.63 0.63 0.69  CALCIUM 8.4* 8.5* 8.3* 8.4* 8.7*  MG 2.1 2.1 2.2 2.4 2.2  PHOS 2.3* 2.7 2.6 2.6 1.9*   GFR: Estimated Creatinine Clearance: 47.9 mL/min (by C-G formula based on SCr of 0.69 mg/dL). Liver Function Tests: Recent Labs  Lab 12/05/19 0105 12/06/19 1100 12/07/19 0312 12/08/19 0148  12/09/19 0210  AST 38 85* 114* 71* 39  ALT 21 45* 78* 76* 63*  ALKPHOS 39 51 55 59 58  BILITOT 0.9 0.6 0.7 1.0 0.5  PROT 5.3* 6.0* 5.6* 5.6* 5.6*  ALBUMIN 2.4* 2.6* 2.6* 2.6* 2.5*   No results for input(s): LIPASE, AMYLASE in the last 168 hours. No results for input(s): AMMONIA in the last 168 hours. Coagulation Profile: Recent Labs  Lab 12/03/19 1001  INR 1.3*   Cardiac Enzymes: No results for input(s): CKTOTAL, CKMB, CKMBINDEX, TROPONINI in the last 168 hours. BNP (last 3 results) No results for input(s): PROBNP in the last 8760 hours. HbA1C: Recent Labs    12/08/19 0143  HGBA1C 6.7*   CBG: Recent Labs  Lab 12/08/19 2356 12/09/19 0351 12/09/19 0756 12/09/19 1151 12/09/19 1658  GLUCAP 168* 120* 129* 298* 294*   Lipid Profile: No results for input(s): CHOL, HDL, LDLCALC, TRIG, CHOLHDL, LDLDIRECT in the last 72 hours. Thyroid Function Tests: No results for input(s): TSH, T4TOTAL, FREET4, T3FREE, THYROIDAB in the last 72 hours. Anemia Panel: Recent Labs    12/08/19 0500 12/09/19 0210  FERRITIN 398* 354*   Sepsis Labs: Recent Labs  Lab 12/03/19 0008 12/03/19 0635 12/03/19 1001  PROCALCITON  --  0.21  --   LATICACIDVEN 5.5*  --  1.7    Recent Results (from the past 240 hour(s))  Culture, blood (routine x 2)     Status: None   Collection Time: 12/02/19  2:16 PM   Specimen: BLOOD  Result Value Ref Range Status   Specimen Description BLOOD LEFT ANTECUBITAL  Final   Special Requests   Final    BOTTLES DRAWN AEROBIC AND ANAEROBIC Blood Culture results may not be optimal due to an inadequate volume of blood received in culture bottles   Culture   Final    NO GROWTH 5 DAYS Performed at [facility], [place] [address], [place], [place] [address]    Report Status 12/07/2019 FINAL  Final  Culture, blood (routine x 2)     Status: None   Collection Time: 12/02/19  2:21 PM   Specimen: BLOOD  Result Value Ref Range Status   Specimen Description BLOOD BLOOD  RIGHT WRIST  Final   Special Requests   Final  BOTTLES DRAWN AEROBIC AND ANAEROBIC Blood Culture results may not be optimal due to an inadequate volume of blood received in culture bottles   Culture   Final    NO GROWTH 5 DAYS Performed at [facility], [place] [address] [place], [place] [address]    Report Status 12/07/2019 FINAL  Final  Urine culture     Status: None   Collection Time: 12/02/19  7:05 PM   Specimen: Urine, Random  Result Value Ref Range Status   Specimen Description URINE, RANDOM  Final   Special Requests NONE  Final   Culture   Final    NO GROWTH Performed at [facility], [place] [address] [place], [place] [address]    Report Status 12/03/2019 FINAL  Final  Respiratory Panel by RT PCR (Flu Alanna Storti&B, Covid) - Nasopharyngeal Swab     Status: Abnormal   Collection Time: 12/02/19  7:05 PM   Specimen: Nasopharyngeal Swab  Result Value Ref Range Status   SARS Coronavirus 2 by RT PCR POSITIVE (Oneta Sigman) NEGATIVE Final    Comment: RESULT CALLED TO, READ BACK BY AND VERIFIED WITH: Pecola Lawless RN 12/02/19 2024 JDW (NOTE) SARS-CoV-2 target nucleic acids are DETECTED. SARS-CoV-2 RNA is generally detectable in upper respiratory specimens  during the acute phase of infection. Positive results are indicative of the presence of the identified virus, but do not rule out bacterial infection or co-infection with other pathogens not detected by the test. Clinical correlation with patient history and other diagnostic information is necessary to determine patient infection status. The expected result is Negative. Fact Sheet for Patients:  PinkCheek.be Fact Sheet for Healthcare Providers: GravelBags.it This test is not yet approved or cleared by the [place] FDA and  has been authorized for detection and/or diagnosis of SARS-CoV-2 by FDA under an Emergency Use Authorization (EUA).  This EUA will remain in effect  (meaning this test can be used) for t he duration of  the COVID-19 declaration under Section 564(b)(1) of the Act, 21 U.S.C. section 360bbb-3(b)(1), unless the authorization is terminated or revoked sooner.    Influenza Avonte Sensabaugh by PCR NEGATIVE NEGATIVE Final   Influenza B by PCR NEGATIVE NEGATIVE Final    Comment: (NOTE) The Xpert Xpress SARS-CoV-2/FLU/RSV assay is intended as an aid in  the diagnosis of influenza from Nasopharyngeal swab specimens and  should not be used as Alyn Riedinger sole basis for treatment. Nasal washings and  aspirates are unacceptable for Xpert Xpress SARS-CoV-2/FLU/RSV  testing. Fact Sheet for Patients: PinkCheek.be Fact Sheet for Healthcare Providers: GravelBags.it This test is not yet approved or cleared by the [place] FDA and  has been authorized for detection and/or diagnosis of SARS-CoV-2 by  FDA under an Emergency Use Authorization (EUA). This EUA will remain  in effect (meaning this test can be used) for the duration of the  Covid-19 declaration under Section 564(b)(1) of the Act, 21  U.S.C. section 360bbb-3(b)(1), unless the authorization is  terminated or revoked. Performed at [facility], [place] [address], [place], [place] [address]   MRSA PCR Screening     Status: None   Collection Time: 12/03/19  6:30 PM   Specimen: Nasopharyngeal  Result Value Ref Range Status   MRSA by PCR NEGATIVE NEGATIVE Final    Comment:        The GeneXpert MRSA Assay (FDA approved for NASAL specimens only), is one component of Izabelle Daus comprehensive MRSA colonization surveillance program. It is not intended to diagnose MRSA  infection nor to guide or monitor treatment for MRSA infections. Performed at [facility], [place] [address], [place], [place] [address]          Radiology Studies: No results found.      Scheduled Meds: . amLODipine  5 mg Oral Daily  . citalopram  10 mg Oral Daily  . [START ON  12/10/2019] dexamethasone (DECADRON) injection  6 mg Intravenous Q24H  . donepezil  10 mg Oral QHS  . enoxaparin (LOVENOX) injection  40 mg Subcutaneous Q24H  . feeding supplement (ENSURE ENLIVE)  237 mL Oral TID BM  . insulin aspart  0-15 Units Subcutaneous Q4H  . mouth rinse  15 mL Mouth Rinse BID  . metoprolol succinate  25 mg Oral QHS  . QUEtiapine  12.5 mg Oral QHS  . vitamin B-12  2,000 mcg Oral Daily   Continuous Infusions: . dextrose 50 mL/hr at 12/09/19 0834     LOS: 7 days    Time spent: over 30 min    Fayrene Helper, MD [facility] Pager AMION  If 7PM-7AM, please contact night-coverage [email or web address] Password [facility] 12/09/2019, 6:14 PM

## 2019-12-10 LAB — CBC WITH DIFFERENTIAL/PLATELET
Abs Immature Granulocytes: 0.13 10*3/uL — ABNORMAL HIGH (ref 0.00–0.07)
Basophils Absolute: 0 10*3/uL (ref 0.0–0.1)
Basophils Relative: 0 %
Eosinophils Absolute: 0 10*3/uL (ref 0.0–0.5)
Eosinophils Relative: 0 %
HCT: 42.4 % (ref 36.0–46.0)
Hemoglobin: 13.7 g/dL (ref 12.0–15.0)
Immature Granulocytes: 1 %
Lymphocytes Relative: 6 %
Lymphs Abs: 0.8 10*3/uL (ref 0.7–4.0)
MCH: 29.3 pg (ref 26.0–34.0)
MCHC: 32.3 g/dL (ref 30.0–36.0)
MCV: 90.8 fL (ref 80.0–100.0)
Monocytes Absolute: 1.1 10*3/uL — ABNORMAL HIGH (ref 0.1–1.0)
Monocytes Relative: 8 %
Neutro Abs: 11.3 10*3/uL — ABNORMAL HIGH (ref 1.7–7.7)
Neutrophils Relative %: 85 %
Platelets: 232 10*3/uL (ref 150–400)
RBC: 4.67 MIL/uL (ref 3.87–5.11)
RDW: 13.9 % (ref 11.5–15.5)
WBC: 13.4 10*3/uL — ABNORMAL HIGH (ref 4.0–10.5)
nRBC: 0 % (ref 0.0–0.2)

## 2019-12-10 LAB — COMPREHENSIVE METABOLIC PANEL
ALT: 53 U/L — ABNORMAL HIGH (ref 0–44)
AST: 34 U/L (ref 15–41)
Albumin: 2.6 g/dL — ABNORMAL LOW (ref 3.5–5.0)
Alkaline Phosphatase: 63 U/L (ref 38–126)
Anion gap: 12 (ref 5–15)
BUN: 36 mg/dL — ABNORMAL HIGH (ref 8–23)
CO2: 27 mmol/L (ref 22–32)
Calcium: 8.3 mg/dL — ABNORMAL LOW (ref 8.9–10.3)
Chloride: 101 mmol/L (ref 98–111)
Creatinine, Ser: 0.4 mg/dL — ABNORMAL LOW (ref 0.44–1.00)
GFR calc Af Amer: >60 mL/min (ref >60)
GFR calc non Af Amer: >60 mL/min (ref >60)
Glucose, Bld: 98 mg/dL (ref 70–99)
Potassium: 3.6 mmol/L (ref 3.5–5.1)
Sodium: 140 mmol/L (ref 135–145)
Total Bilirubin: 0.8 mg/dL (ref 0.3–1.2)
Total Protein: 5.4 g/dL — ABNORMAL LOW (ref 6.5–8.1)

## 2019-12-10 LAB — FERRITIN: Ferritin: 393 ng/mL — ABNORMAL HIGH (ref 11–307)

## 2019-12-10 LAB — GLUCOSE, CAPILLARY
Glucose-Capillary: 137 mg/dL — ABNORMAL HIGH (ref 70–99)
Glucose-Capillary: 176 mg/dL — ABNORMAL HIGH (ref 70–99)
Glucose-Capillary: 180 mg/dL — ABNORMAL HIGH (ref 70–99)
Glucose-Capillary: 205 mg/dL — ABNORMAL HIGH (ref 70–99)
Glucose-Capillary: 284 mg/dL — ABNORMAL HIGH (ref 70–99)
Glucose-Capillary: 76 mg/dL (ref 70–99)
Glucose-Capillary: 95 mg/dL (ref 70–99)

## 2019-12-10 LAB — C-REACTIVE PROTEIN: CRP: 0.9 mg/dL (ref <1.0)

## 2019-12-10 LAB — PHOSPHORUS: Phosphorus: 1.8 mg/dL — ABNORMAL LOW (ref 2.5–4.6)

## 2019-12-10 LAB — D-DIMER, QUANTITATIVE: D-Dimer, Quant: 2.26 ug/mL-FEU — ABNORMAL HIGH (ref 0.00–0.50)

## 2019-12-10 LAB — MAGNESIUM: Magnesium: 2 mg/dL (ref 1.7–2.4)

## 2019-12-10 NOTE — TOC Initial Note (Signed)
Transition of Care [facility]) - Initial/Assessment Note    Patient Details  Name: Shirley [name] MRN: [id number] Date of Birth: 1934-01-19  [facility] of [facility]) CM/SW Contact:    Shade Flood, LCSW Phone Number: 12/10/2019, 4:13 PM  Clinical Narrative:                  Pt admitted from [facility]. Spoke with pt's son, Sherren Mocha, today and he states that they are hoping for pt to return there at dc. Message left for Janie at the SNF to inquire. Per MD, pt approaching dc.  Will await return call from [facility] and continue to follow for dc planning needs.  Expected Discharge Plan: Skilled Nursing Facility Barriers to Discharge: Continued Medical Work up   Patient Goals and CMS Choice        Expected Discharge Plan and Services Expected Discharge Plan: [place]       Living arrangements for the past 2 months: [place], [place]                                      Prior Living Arrangements/Services Living arrangements for the past 2 months: [place], [place] Lives with:: Facility Resident Patient language and need for interpreter reviewed:: Yes Do you feel safe going back to the place where you live?: Yes      Need for Family Participation in Patient Care: No (Comment) Care giver support system in place?: Yes (comment)   Criminal Activity/Legal Involvement Pertinent to Current Situation/Hospitalization: No - Comment as needed  Activities of Daily Living Home Assistive Devices/Equipment: Bedside commode/3-in-1 ADL Screening (condition at time of admission) Patient's cognitive ability adequate to safely complete daily activities?: No Is the patient deaf or have difficulty hearing?: No Does the patient have difficulty seeing, even when wearing glasses/contacts?: No Does the patient have difficulty concentrating, remembering, or making decisions?: Yes Patient able to express  need for assistance with ADLs?: No Does the patient have difficulty dressing or bathing?: Yes Independently performs ADLs?: No Communication: Dependent Is this a change from baseline?: Pre-admission baseline Dressing (OT): Dependent Is this a change from baseline?: Pre-admission baseline Grooming: Dependent Is this a change from baseline?: Pre-admission baseline Feeding: Dependent Is this a change from baseline?: Pre-admission baseline Bathing: Dependent Is this a change from baseline?: Pre-admission baseline Toileting: Dependent Is this a change from baseline?: Pre-admission baseline In/Out Bed: Dependent Is this a change from baseline?: Pre-admission baseline Walks in Home: Dependent Is this a change from baseline?: Pre-admission baseline Does the patient have difficulty walking or climbing stairs?: Yes Weakness of Legs: Both Weakness of Arms/Hands: Both  Permission Sought/Granted                  Emotional Assessment       Orientation: : Oriented to Self Alcohol / Substance Use: Not Applicable Psych Involvement: No (comment)  Admission diagnosis:  Healthcare-associated pneumonia [J18.9] Sepsis due to pneumonia ([place]) [J18.9, A41.9] Patient Active Problem List   Diagnosis Date Noted  . Dementia with behavioral disturbance ([place])   . Pneumonia due to COVID-19 virus 12/03/2019  . Hypernatremia   . Healthcare-associated pneumonia 12/02/2019  . Palliative care by specialist   . Goals of care, counseling/discussion   . DNR (do not resuscitate)   . Acute encephalopathy 11/23/2019  . Ascending aortic aneurysm ([place]) 11/23/2019  . Moderate dementia (  HCC) 08/10/2015  . Frequent falls 05/08/2015  . Anxiety 05/08/2015  . Anxiety state, unspecified 03/06/2014  . Short-term memory loss 12/30/2013  . Lumbar degenerative disc disease 08/22/2011  . Polymyalgia rheumatica (HCC) 05/16/2008  . Essential hypertension 03/12/2008  . Arthritis, senescent 03/12/2008  . MYALGIA  03/12/2008  . OSTEOPOROSIS 03/12/2008   PCP:  Roderick Pee, MD (Inactive) Pharmacy:   [facility] [address], [place] - [address] [place] [address] Phone: [phone] Fax: [phone]  [facility] - [place], [place] - [place] [address] [place] [address] [place] [address] [place] [address] Phone: [phone] Fax: [phone]  [facility] - [place], [place] - [address] [place] [address] Phone: [phone] Fax: [phone]     Social Determinants of Health (SDOH) Interventions    Readmission Risk Interventions Readmission Risk Prevention Plan 12/10/2019  Transportation Screening Complete  Home Care Screening Not Complete  Home Care Screening Not Completed Comments Returning to SNF  Medication Review (RN CM) Complete  Some recent data might be hidden

## 2019-12-10 NOTE — Progress Notes (Signed)
Patient able to have a face-time encounter with granddaughter Shirley [name]. Updated her on patient status and she stated that she would call back tomorrow to speak with patient again.

## 2019-12-10 NOTE — Progress Notes (Signed)
PROGRESS NOTE    Shirley Shirley [name]  EAV:[id number] DOB: Jan 06, 1934 DOA: 12/02/2019 PCP: Shirley Cookey, MD (Inactive)   Brief Narrative: HPI per Shirley Shirley [name] Shirley Shirley [name] 86 y.o.femalewith medical history significant ofdementia; HTN; PMR; and recent hospitalization presenting with code sepsis. She was previously hospitalized from 12/19-24 with acute metabolic encephalopathy and fever of uncertain source. MOST form was completed with DNR/DNI; limited additional interventions including rehospitalization, limited abx, IVF, and time trial feeding tube after discussion with family. She had poor PO intake, tachycardia, tachypnea at her facility with Shirley [name] sats 80s, improved to 96% with 3L Shirley Shirley [name]. GCS 9 in the field, improved with IVF bolus. The facility told her son the HR was increased, and this was the same reason she was here last time. Family has not seen her in Shirley Shirley [name] long time. Her son had Shirley Shirley [name] double lung transplant on Feb 9, last saw her Feb 8 prior to last hospitalization. He recognizes that her dementia has gotten worse over the last 10 months. At the time of last d/c, she was talking Shirley Shirley [name] lot but all over the place, alert. She alsoslept Shirley Shirley [name] lot while she was here last time.   ED Course:Recently admitted. Called code stroke, not code stroke. Dental infection, given Augmentin and sent back. Sats 80, HR 120, appears to have PNA. Previously with palliative care consult, limited care. Temp 100.3.  She was admitted for COVID pneumonia and HCAP.  She's improved with abx, steroids, remdesivir.  Currently on RA.  Her PO intake continues to be poor, but from respiratory standpoint, she's now on room air.  Her mental status has improved as well.  She's approaching ready for discharge, appreciate palliative care assistance.    Assessment & Plan:   Principal Problem:   Healthcare-associated pneumonia Active Problems:   Essential hypertension   Polymyalgia rheumatica (HCC)   Moderate  dementia (Shirley [name])   Acute encephalopathy   Goals of care, counseling/discussion   DNR (do not resuscitate)   Pneumonia due to COVID-19 virus   Hypernatremia   Dementia with behavioral disturbance (Shirley Shirley [name])  # Goals of care/DNR Patient noted to have Shirley Shirley [name] poor overall prognosis and palliative care consulted during last hospitalization.  Patient made Shirley Shirley [name] DNR and MOST form was completed at that time.  Plan for this admission is to see how she does with care noted below, if she fails to improve or worsens, consider comfort care.  She continues to be confused today.  She's now on RA, she has continued poor oral intake, though overall improved from when she was first admitted.  From respiratory standpoint she's improved enough to be discharge, her decreased PO intake is concerning, but has improved since her admission.  Will need to pay close attention to this going forward.  I think her going with palliative care vs hospice would be appropriate.  Will continue to discuss POC with son/palliative care.   1 Sepsis secondary to Covid Pneumonia  Possible Bacterial Pneumonia   HCAP Patient with recent hospitalization discharged on 11/28/2019 and had presented with fever and thought to be related to Shirley Shirley [name] dental infection and discharged back to assisted living facility with Augmentin.   Patient presented back with decreased oral intake, tachycardia, tachypnea noted to be hypoxic which improved on Shirley [name].   Pt met criteria for sepsis on admission On RA today, improved from resp standpoint CXR from 12/28 with airspace opacity on R in RLL region concerning for pneumonia CXR 1/02 with worsening multifocal pneumonia Urine cx no  growth, blood cx NGTD x5 Negative MRSA PCR, will d/c vancomycin Cefepime x 7 days Continue remdesivir and steroids  COVID-19 Labs  Recent Labs    12/08/19 0148 12/08/19 0500 12/09/19 0210 12/10/19 0255  DDIMER 2.49*  --  2.45* 2.26*  FERRITIN  --  398* 354* 393*  CRP  --  2.3* 1.3* 0.9    Lab  Results  Component Value Date   SARSCOV2NAA POSITIVE (Shirley Shirley [name]) 12/02/2019   [place] NEGATIVE 11/27/2019   [place] NEGATIVE 11/23/2019    2.  Acute metabolic encephalopathy Likely secondary to problem #1 in the setting of underlying dementia and dehydration.  She continues to be confused, though now it seems like she's closer to her baseline. Will d/c seroquel (was started for agitation)  # Superventricular Tachycardia: 2 runs 12/31 AM.  Restart metoprolol.    3.  Moderate dementia resume Aricept.  Continue to hold Celexa for now.  4.  Hypertension Metoprolol  5.  PMR Patient currently on IV steroids secondary to problem #1.  Outpatient follow-up.  6.  Hypernatremia Improved, follow off IVF  # Poor PO intake: encourage PO, follow closely.     # Elevated LFT's: continue to monitor, improving.  Follow acute hepatitis panel (negative).   # Hypernatremia: give D5, follow   DVT prophylaxis: lovenox Code Status: DNR Family Communication: none at bedside - discussed with son 12/09/19 Disposition Plan: pending  Consultants:   Palliative care  Procedures:   none  Antimicrobials:  Anti-infectives (From admission, onward)   Start     Dose/Rate Route Frequency Ordered Stop   12/04/19 1400  vancomycin (VANCOREADY) IVPB 1250 mg/250 mL  Status:  Discontinued     1,250 mg 166.7 mL/hr over 90 Minutes Intravenous Every 48 hours 12/02/19 1555 12/04/19 1232   12/04/19 1000  remdesivir 100 mg in sodium chloride 0.9 % 100 mL IVPB     100 mg 200 mL/hr over 30 Minutes Intravenous Daily 12/03/19 0912 12/07/19 1041   12/03/19 0915  remdesivir 200 mg in sodium chloride 0.9% 250 mL IVPB     200 mg 580 mL/hr over 30 Minutes Intravenous Once 12/03/19 0912 12/03/19 1117   12/03/19 0500  ceFEPIme (MAXIPIME) 2 g in sodium chloride 0.9 % 100 mL IVPB     2 g 200 mL/hr over 30 Minutes Intravenous Every 12 hours 12/02/19 1555 12/09/19 1757   12/02/19 1530  ceFEPIme (MAXIPIME) 2 g in sodium  chloride 0.9 % 100 mL IVPB     2 g 200 mL/hr over 30 Minutes Intravenous  Once 12/02/19 1523 12/02/19 1758   12/02/19 1530  vancomycin (VANCOCIN) IVPB 1000 mg/200 mL premix     1,000 mg 200 mL/hr over 60 Minutes Intravenous  Once 12/02/19 1523 12/02/19 1758     Subjective: Confused, difficult to understand, but is speaking Shirley Shirley [name] bit more today  Objective: Vitals:   12/09/19 2027 12/10/19 0044 12/10/19 0402 12/10/19 0748  BP:   133/82 (!) 160/89  Pulse:    (!) 52  Resp:   16 17  Temp: 97.8 F (36.6 C) (!) 96.8 F (36 C) (!) 96.6 F (35.9 C)   TempSrc: Axillary Axillary Axillary   SpO2:    96%  Weight:      Height:        Intake/Output Summary (Last 24 hours) at 12/10/2019 1521 Last data filed at 12/10/2019 1300 Gross per 24 hour  Intake 1037 ml  Output 450 ml  Net 587 ml   [employer]  12/02/19 1500  Weight: 59.5 kg    Examination:  General: No acute distress. Cardiovascular: RRR Lungs: Clear to auscultation bilaterally Abdomen: Soft, nontender, nondistended. No masses. No hepatosplenomegaly. Neurological: Alert and confused. Moves all extremities 4. Cranial nerves II through XII grossly intact. Skin: Warm and dry. No rashes or lesions. Extremities: No clubbing or cyanosis. No edema.   Data Reviewed: I have personally reviewed following labs and imaging studies  CBC: Recent Labs  Lab 12/06/19 1100 12/07/19 0312 12/08/19 0148 12/09/19 0210 12/10/19 0255  WBC 11.8* 10.4 9.5 13.3* 13.4*  NEUTROABS 10.0* 8.9* 8.5* 11.7* 11.3*  HGB 15.3* 14.5 14.5 14.2 13.7  HCT 46.9* 44.6 45.2 43.1 42.4  MCV 91.2 89.9 90.4 91.1 90.8  PLT 203 211 225 236 161   Basic Metabolic Panel: Recent Labs  Lab 12/06/19 1100 12/07/19 0312 12/08/19 0148 12/09/19 0210 12/10/19 0255  NA 145 142 145 148* 140  K 3.6 3.5 4.2 3.9 3.6  CL 108 104 107 111 101  CO2 _0 GLUCOSE 168* 207* 276* 137* 98  BUN 31* 31* 39* 44* 36*  CREATININE 0.79 0.63 0.63 0.69 0.40*  CALCIUM  8.5* 8.3* 8.4* 8.7* 8.3*  MG 2.1 2.2 2.4 2.2 2.0  PHOS 2.7 2.6 2.6 1.9* 1.8*   GFR: Estimated Creatinine Clearance: 47.9 mL/min (Shareta Fishbaugh) (by C-G formula based on SCr of 0.4 mg/dL (L)). Liver Function Tests: Recent Labs  Lab 12/06/19 1100 12/07/19 0312 12/08/19 0148 12/09/19 0210 12/10/19 0255  AST 85* 114* 71* 39 34  ALT 45* 78* 76* 63* 53*  ALKPHOS 51 55 59 58 63  BILITOT 0.6 0.7 1.0 0.5 0.8  PROT 6.0* 5.6* 5.6* 5.6* 5.4*  ALBUMIN 2.6* 2.6* 2.6* 2.5* 2.6*   No results for input(s): LIPASE, AMYLASE in the last 168 hours. No results for input(s): AMMONIA in the last 168 hours. Coagulation Profile: No results for input(s): INR, PROTIME in the last 168 hours. Cardiac Enzymes: No results for input(s): CKTOTAL, CKMB, CKMBINDEX, TROPONINI in the last 168 hours. BNP (last 3 results) No results for input(s): PROBNP in the last 8760 hours. HbA1C: Recent Labs    12/08/19 0143  HGBA1C 6.7*   CBG: Recent Labs  Lab 12/09/19 2034 12/10/19 0049 12/10/19 0354 12/10/19 0747 12/10/19 1112  GLUCAP 335* 137* 95 76 205*   Lipid Profile: No results for input(s): CHOL, HDL, LDLCALC, TRIG, CHOLHDL, LDLDIRECT in the last 72 hours. Thyroid Function Tests: No results for input(s): TSH, T4TOTAL, FREET4, T3FREE, THYROIDAB in the last 72 hours. Anemia Panel: Recent Labs    12/09/19 0210 12/10/19 0255  FERRITIN 354* 393*   Sepsis Labs: No results for input(s): PROCALCITON, LATICACIDVEN in the last 168 hours.  Recent Results (from the past 240 hour(s))  Culture, blood (routine x 2)     Status: None   Collection Time: 12/02/19  2:16 PM   Specimen: BLOOD  Result Value Ref Range Status   Specimen Description BLOOD LEFT ANTECUBITAL  Final   Special Requests   Final    BOTTLES DRAWN AEROBIC AND ANAEROBIC Blood Culture results may not be optimal due to an inadequate volume of blood received in culture bottles   Culture   Final    NO GROWTH 5 DAYS Performed at [facility], [place] [address], [place], [place] [address]    Report Status 12/07/2019 FINAL  Final  Culture, blood (routine x 2)     Status: None   Collection Time: 12/02/19  2:21  PM   Specimen: BLOOD  Result Value Ref Range Status   Specimen Description BLOOD BLOOD RIGHT WRIST  Final   Special Requests   Final    BOTTLES DRAWN AEROBIC AND ANAEROBIC Blood Culture results may not be optimal due to an inadequate volume of blood received in culture bottles   Culture   Final    NO GROWTH 5 DAYS Performed at [facility], [place] [address] [place], [place] [address]    Report Status 12/07/2019 FINAL  Final  Urine culture     Status: None   Collection Time: 12/02/19  7:05 PM   Specimen: Urine, Random  Result Value Ref Range Status   Specimen Description URINE, RANDOM  Final   Special Requests NONE  Final   Culture   Final    NO GROWTH Performed at [facility], [place] [address], [place], [place] [address]    Report Status 12/03/2019 FINAL  Final  Respiratory Panel by RT PCR (Flu Alinah Sheard&B, Covid) - Nasopharyngeal Swab     Status: Abnormal   Collection Time: 12/02/19  7:05 PM   Specimen: Nasopharyngeal Swab  Result Value Ref Range Status   SARS Coronavirus 2 by RT PCR POSITIVE (Lanisa Ishler) NEGATIVE Final    Comment: RESULT CALLED TO, READ BACK BY AND VERIFIED WITH: Pecola Lawless RN 12/02/19 2024 JDW (NOTE) Shirley Shirley [name] target nucleic acids are DETECTED. Shirley Shirley [name] RNA is generally detectable in upper respiratory specimens  during the acute phase of infection. Positive results are indicative of the presence of the identified virus, but do not rule out bacterial infection or co-infection with other pathogens not detected by the test. Clinical correlation with patient history and other diagnostic information is necessary to determine patient infection status. The expected result is Negative. Fact Sheet for Patients:  PinkCheek.be Fact Sheet for Healthcare  Providers: GravelBags.it This test is not yet approved or cleared by the [place] FDA and  has been authorized for detection and/or diagnosis of Shirley Shirley [name] by FDA under an Emergency Use Authorization (EUA).  This EUA will remain in effect (meaning this test can be used) for t he duration of  the COVID-19 declaration under Section 564(b)(1) of the Act, 21 U.S.C. section 360bbb-3(b)(1), unless the authorization is terminated or revoked sooner.    Influenza Sharnice Bosler by PCR NEGATIVE NEGATIVE Final   Influenza B by PCR NEGATIVE NEGATIVE Final    Comment: (NOTE) The Xpert Xpress Shirley Shirley [name]/FLU/RSV assay is intended as an aid in  the diagnosis of influenza from Nasopharyngeal swab specimens and  should not be used as Britteney Ayotte sole basis for treatment. Nasal washings and  aspirates are unacceptable for Xpert Xpress Shirley Shirley [name]/FLU/RSV  testing. Fact Sheet for Patients: PinkCheek.be Fact Sheet for Healthcare Providers: GravelBags.it This test is not yet approved or cleared by the [place] FDA and  has been authorized for detection and/or diagnosis of Shirley Shirley [name] by  FDA under an Emergency Use Authorization (EUA). This EUA will remain  in effect (meaning this test can be used) for the duration of the  Covid-19 declaration under Section 564(b)(1) of the Act, 21  U.S.C. section 360bbb-3(b)(1), unless the authorization is  terminated or revoked. Performed at [facility], [place] [address] [place], [place] [address]   MRSA PCR Screening     Status: None   Collection Time: 12/03/19  6:30 PM   Specimen: Nasopharyngeal  Result Value Ref Range Status   MRSA by PCR NEGATIVE NEGATIVE Final    Comment:  The GeneXpert MRSA Assay (FDA approved for NASAL specimens only), is one component of Jaedyn Marrufo comprehensive MRSA colonization surveillance program. It is not intended to diagnose MRSA infection nor to guide  or monitor treatment for MRSA infections. Performed at [facility], [place] [address], [place], [place] [address]          Radiology Studies: No results found.      Scheduled Meds: . amLODipine  5 mg Oral Daily  . citalopram  10 mg Oral Daily  . dexamethasone (DECADRON) injection  6 mg Intravenous Q24H  . donepezil  10 mg Oral QHS  . enoxaparin (LOVENOX) injection  40 mg Subcutaneous Q24H  . feeding supplement (ENSURE ENLIVE)  237 mL Oral TID BM  . insulin aspart  0-20 Units Subcutaneous Q4H  . mouth rinse  15 mL Mouth Rinse BID  . metoprolol succinate  25 mg Oral QHS  . QUEtiapine  12.5 mg Oral QHS  . vitamin B-12  2,000 mcg Oral Daily   Continuous Infusions:    LOS: 8 days    Time spent: over 30 min    Fayrene Helper, MD [facility] Pager AMION  If 7PM-7AM, please contact night-coverage [email or web address] Password [email or web address] 12/10/2019, 3:21 PM

## 2019-12-10 NOTE — Progress Notes (Addendum)
Spoke to the patient's son and gave updates

## 2019-12-10 NOTE — NC FL2 (Signed)
[place] MEDICAID FL2 LEVEL OF CARE SCREENING TOOL     IDENTIFICATION  Patient Name: Shirley [name] Birthdate: [birth year] Sex: female Admission Date (Current Location): 12/02/2019  [facility] and [place] Number:  Herbalist and Address:  [facility], [place] [address] [place], [place] [address]      Provider Number: [id number]  Attending Physician Name and Address:  Elodia Florence., *  Relative Name and Phone Number:       Current Level of Care: Hospital Recommended Level of Care: [place] Prior Approval Number:    Date Approved/Denied:   PASRR Number: [id number]  Discharge Plan: SNF    Current Diagnoses: Patient Active Problem List   Diagnosis Date Noted  . Dementia with behavioral disturbance ([place])   . Pneumonia due to COVID-19 virus 12/03/2019  . Hypernatremia   . Healthcare-associated pneumonia 12/02/2019  . Palliative care by specialist   . Goals of care, counseling/discussion   . DNR (do not resuscitate)   . Acute encephalopathy 11/23/2019  . Ascending aortic aneurysm ([place]) 11/23/2019  . Moderate dementia ([place]) 08/10/2015  . Frequent falls 05/08/2015  . Anxiety 05/08/2015  . Anxiety state, unspecified 03/06/2014  . Short-term memory loss 12/30/2013  . Lumbar degenerative disc disease 08/22/2011  . Polymyalgia rheumatica ([place]) 05/16/2008  . Essential hypertension 03/12/2008  . Arthritis, senescent 03/12/2008  . MYALGIA 03/12/2008  . OSTEOPOROSIS 03/12/2008    Orientation RESPIRATION BLADDER Height & Weight     Self  Normal Incontinent Weight: 131 lb 2.8 oz (59.5 kg) Height:  5\' 5"  (165.1 cm)  BEHAVIORAL SYMPTOMS/MOOD NEUROLOGICAL BOWEL NUTRITION STATUS      Incontinent Diet(see dc summary)  AMBULATORY STATUS COMMUNICATION OF NEEDS Skin   Extensive Assist Verbally                         Personal Care Assistance Level of Assistance    Bathing Assistance: Maximum assistance Feeding  assistance: Independent Dressing Assistance: Maximum assistance     Functional Limitations Info    Sight Info: Adequate Hearing Info: Adequate Speech Info: Adequate    SPECIAL CARE FACTORS FREQUENCY  PT (By licensed PT), OT (By licensed OT)     PT Frequency: 5 times weekly OT Frequency: 3-5 times weekly            Contractures Contractures Info: Not present    Additional Factors Info  Psychotropic Code Status Info: DNR Allergies Info: NKA Psychotropic Info: Celexa         Current Medications (12/10/2019):  This is the current hospital active medication list Current Facility-Administered Medications  Medication Dose Route Frequency Provider Last Rate Last Admin  . acetaminophen (TYLENOL) suppository 650 mg  650 mg Rectal Q4H PRN Lang Snow, FNP   650 mg at 12/03/19 5621  . acetaminophen (TYLENOL) tablet 650 mg  650 mg Oral Q6H PRN Lang Snow, FNP      . amLODipine (NORVASC) tablet 5 mg  5 mg Oral Daily Elodia Florence., MD   5 mg at 12/10/19 1048  . citalopram (CELEXA) tablet 10 mg  10 mg Oral Daily Elodia Florence., MD   10 mg at 12/10/19 1049  . dexamethasone (DECADRON) injection 6 mg  6 mg Intravenous Q24H Elodia Florence., MD   6 mg at 12/10/19 1048  . donepezil (ARICEPT) tablet 10 mg  10 mg Oral QHS Elodia Florence., MD   10  mg at 12/09/19 2105  . enoxaparin (LOVENOX) injection 40 mg  40 mg Subcutaneous Q24H Jonah Blue, MD   40 mg at 12/09/19 1728  . feeding supplement (ENSURE ENLIVE) (ENSURE ENLIVE) liquid 237 mL  237 mL Oral TID BM Zigmund Daniel., MD   237 mL at 12/10/19 1257  . insulin aspart (novoLOG) injection 0-20 Units  0-20 Units Subcutaneous Q4H Zigmund Daniel., MD   4 Units at 12/10/19 1256  . MEDLINE mouth rinse  15 mL Mouth Rinse BID Jonah Blue, MD   15 mL at 12/10/19 1049  . metoprolol succinate (TOPROL-XL) 24 hr tablet 25 mg  25 mg Oral QHS Zigmund Daniel., MD   25 mg at 12/09/19 2105  .  vitamin B-12 (CYANOCOBALAMIN) tablet 2,000 mcg  2,000 mcg Oral Daily Zigmund Daniel., MD   2,000 mcg at 12/10/19 1048     Discharge Medications: Please see discharge summary for a list of discharge medications.  Relevant Imaging Results:  Relevant Lab Results:   Additional Information SSN [id number], LCSW

## 2019-12-10 NOTE — Progress Notes (Signed)
Daily Progress Note   Patient Name: Shirley [name]       Date: 12/10/2019 DOB: 1934/05/08  Age: 86 y.o. MRN#: [id number] Attending Physician: Zigmund Daniel., * Primary Care Physician: Roderick Pee, MD (Inactive) Admit Date: 12/02/2019  Reason for Consultation/Follow-up: Establishing goals of care  Subjective: Per chart review and update from Dr. Lowell Guitar, patient is more awake, remains confused with baseline dementia. Agitation and oral intake improved. On room air.   Spoke with son, Tawanna Cooler via telephone. Provided updated on plan of care and clinical stability. He is hopeful for his mother to return to [facility] SNF under rehab. He is agreeable with outpatient palliative referral to follow at SNF. MOST form completed with DNR/DNI code status.   Length of Stay: 8  Current Medications: Scheduled Meds:  . amLODipine  5 mg Oral Daily  . citalopram  10 mg Oral Daily  . dexamethasone (DECADRON) injection  6 mg Intravenous Q24H  . donepezil  10 mg Oral QHS  . enoxaparin (LOVENOX) injection  40 mg Subcutaneous Q24H  . feeding supplement (ENSURE ENLIVE)  237 mL Oral TID BM  . insulin aspart  0-20 Units Subcutaneous Q4H  . mouth rinse  15 mL Mouth Rinse BID  . metoprolol succinate  25 mg Oral QHS  . vitamin B-12  2,000 mcg Oral Daily    Continuous Infusions:   PRN Meds: acetaminophen, acetaminophen  Physical Exam          Vital Signs: BP (!) 149/88 (BP Location: Right Arm)   Pulse (!) 52   Temp 97.8 F (36.6 C) (Axillary)   Resp 13   Ht 5\' 5"  (1.651 m)   Wt 59.5 kg   SpO2 96%   BMI 21.83 kg/m  SpO2: SpO2: 96 % O2 Device: O2 Device: Room Air O2 Flow Rate: O2 Flow Rate (L/min): 2 L/min  Intake/output summary:   Intake/Output Summary (Last 24 hours) at 12/10/2019  1624 Last data filed at 12/10/2019 1300 Gross per 24 hour  Intake 1157 ml  Output 450 ml  Net 707 ml   LBM: Last BM Date: 12/07/19 Baseline Weight: Weight: 59.5 kg Most recent weight: Weight: 59.5 kg       Palliative Assessment/Data: PPS 30%    Flowsheet Rows     Most Recent Value  Intake  Tab  Referral Department  Hospitalist  Unit at Time of Referral  ER  Palliative Care Primary Diagnosis  Sepsis/Infectious Disease  Date Notified  12/03/19  Palliative Care Type  Return patient Palliative Care  Reason for referral  Clarify Goals of Care  Date of Admission  12/02/19  Date first seen by Palliative Care  12/04/19  # of days Palliative referral response time  1 Day(s)  # of days IP prior to Palliative referral  1  Clinical Assessment  Psychosocial & Spiritual Assessment  Palliative Care Outcomes      Patient Active Problem List   Diagnosis Date Noted  . Dementia with behavioral disturbance ([place])   . Pneumonia due to COVID-19 virus 12/03/2019  . Hypernatremia   . Healthcare-associated pneumonia 12/02/2019  . Palliative care by specialist   . Goals of care, counseling/discussion   . DNR (do not resuscitate)   . Acute encephalopathy 11/23/2019  . Ascending aortic aneurysm ([place]) 11/23/2019  . Moderate dementia ([place]) 08/10/2015  . Frequent falls 05/08/2015  . Anxiety 05/08/2015  . Anxiety state, unspecified 03/06/2014  . Short-term memory loss 12/30/2013  . Lumbar degenerative disc disease 08/22/2011  . Polymyalgia rheumatica ([place]) 05/16/2008  . Essential hypertension 03/12/2008  . Arthritis, senescent 03/12/2008  . MYALGIA 03/12/2008  . OSTEOPOROSIS 03/12/2008    Palliative Care Assessment & Plan   Patient Profile: 86 y.o. female  with past medical history of dementia, anxiety, hypertension, polymyalgia rheumatica admitted on 11/23/2019 with increased confusion and left-sided weakness. CT head, CTA head, and MRI negative for acute findings. Neurology signed off.  Patient with acute metabolic encephalopathy, questionable progression of dementia and/or vitamin B12 deficiency. Gingivitis receiving Augmentin with recommendation for outpatient follow-up with dentist. Generalized deconditioning. Palliative medicine consultation for goals of care.   Patient discharged to SNF for rehab on 11/28/19. Readmission 12/02/19 for decreased oral intake, tachycardia, tachypnea, and hypoxia. Found to have sepsis secondary to COVID 19 pneumonia and possible bacterial pneumonia. Son confirms DNR/DNI but would like medical management. Patient receiving IVF, ABX, steroids, and remdesivir. Ongoing poor oral intake and agitation requiring restraints. Baseline dementia.   Assessment: Sepsis Covid Pneumonia with possible bacterial pneumonia Acute metabolic encephalopathy SVT Dementia PMR Hypernatremia  Recommendations/Plan: DNR/DNI. MOST form uploaded into EMR. Otherwise, continue current plan of care and medical management. Continue encouragement during meals.  Patient stabilizing. Son wishes for patient to return to SNF under rehab. Son is agreeable with outpatient palliative referral at SNF. Notified SW.   Code Status: DNR/DNI   Code Status Orders  (From admission, onward)         Start     Ordered   12/02/19 1746  Do not attempt resuscitation (DNR)  Continuous    Question Answer Comment  In the event of cardiac or respiratory ARREST Do not call a "code blue"   In the event of cardiac or respiratory ARREST Do not perform Intubation, CPR, defibrillation or ACLS   In the event of cardiac or respiratory ARREST Use medication by any route, position, wound care, and other measures to relive pain and suffering. May use oxygen, suction and manual treatment of airway obstruction as needed for comfort.      12/02/19 1748        Code Status History    Date Active Date Inactive Code Status Order ID Comments User Context   11/27/2019 1227 11/28/2019 1623 DNR [id number]   Basilio Cairo, NP Inpatient   11/24/2019 0222 11/27/2019 1226 Full Code [id number]  Briscoe Deutscher, MD Inpatient   07/04/2015 2206 07/06/2015 1833 Full Code [id number]  Therisa Doyne, MD Inpatient   10/20/2013 2100 10/22/2013 1824 Full Code [id number]  Kathlen Mody, MD Inpatient   02/28/2013 1915 03/01/2013 1324 Full Code [id number]  Cristi Loron, MD Inpatient   Advance Care Planning Activity    Advance Directive Documentation     Most Recent Value  Type of Advance Directive  Out of facility DNR (pink MOST or yellow form)  Pre-existing out of facility DNR order (yellow form or pink MOST form)  --  "MOST" Form in Place?  --      Prognosis:  Guarded prognosis with progression of dementia following recent hospitalizations and declining functional/cognitive/nutritional status.   Discharge Planning: Skilled Nursing Facility for rehab with Palliative care service follow-up  Care plan was discussed with Dr. Lowell Guitar, son Tawanna Cooler), SW  Thank you for allowing the Palliative Medicine Team to assist in the care of this patient.  The above conversation was completed via telephone due to visitor restrictions during COVID-19 pandemic. Thorough chart review and discussion with multidisciplinary team was completed as part of assessment. No physical examination was performed.   Time In: 1615- Time Out: 1630 Total Time 15 Prolonged Time Billed no      Greater than 50%  of this time was spent counseling and coordinating care related to the above assessment and plan.  Vennie Homans, DNP, FNP-C Palliative Medicine Team  Phone: [phone] Fax: [phone]  Please contact Palliative Medicine Team phone at [phone] for questions and concerns.

## 2019-12-11 DIAGNOSIS — J189 Pneumonia, unspecified organism: Secondary | ICD-10-CM

## 2019-12-11 LAB — CBC WITH DIFFERENTIAL/PLATELET
Abs Immature Granulocytes: 0.16 10*3/uL — ABNORMAL HIGH (ref 0.00–0.07)
Basophils Absolute: 0 10*3/uL (ref 0.0–0.1)
Basophils Relative: 0 %
Eosinophils Absolute: 0 10*3/uL (ref 0.0–0.5)
Eosinophils Relative: 0 %
HCT: 44.9 % (ref 36.0–46.0)
Hemoglobin: 14.6 g/dL (ref 12.0–15.0)
Immature Granulocytes: 2 %
Lymphocytes Relative: 6 %
Lymphs Abs: 0.6 10*3/uL — ABNORMAL LOW (ref 0.7–4.0)
MCH: 28.7 pg (ref 26.0–34.0)
MCHC: 32.5 g/dL (ref 30.0–36.0)
MCV: 88.4 fL (ref 80.0–100.0)
Monocytes Absolute: 0.9 10*3/uL (ref 0.1–1.0)
Monocytes Relative: 9 %
Neutro Abs: 8.4 10*3/uL — ABNORMAL HIGH (ref 1.7–7.7)
Neutrophils Relative %: 83 %
Platelets: 218 10*3/uL (ref 150–400)
RBC: 5.08 MIL/uL (ref 3.87–5.11)
RDW: 13.6 % (ref 11.5–15.5)
WBC: 10.1 10*3/uL (ref 4.0–10.5)
nRBC: 0 % (ref 0.0–0.2)

## 2019-12-11 LAB — COMPREHENSIVE METABOLIC PANEL
ALT: 47 U/L — ABNORMAL HIGH (ref 0–44)
AST: 35 U/L (ref 15–41)
Albumin: 2.7 g/dL — ABNORMAL LOW (ref 3.5–5.0)
Alkaline Phosphatase: 62 U/L (ref 38–126)
Anion gap: 11 (ref 5–15)
BUN: 39 mg/dL — ABNORMAL HIGH (ref 8–23)
CO2: 29 mmol/L (ref 22–32)
Calcium: 8.3 mg/dL — ABNORMAL LOW (ref 8.9–10.3)
Chloride: 99 mmol/L (ref 98–111)
Creatinine, Ser: 0.49 mg/dL (ref 0.44–1.00)
GFR calc Af Amer: >60 mL/min (ref >60)
GFR calc non Af Amer: >60 mL/min (ref >60)
Glucose, Bld: 120 mg/dL — ABNORMAL HIGH (ref 70–99)
Potassium: 4 mmol/L (ref 3.5–5.1)
Sodium: 139 mmol/L (ref 135–145)
Total Bilirubin: 1.2 mg/dL (ref 0.3–1.2)
Total Protein: 5.8 g/dL — ABNORMAL LOW (ref 6.5–8.1)

## 2019-12-11 LAB — GLUCOSE, CAPILLARY
Glucose-Capillary: 116 mg/dL — ABNORMAL HIGH (ref 70–99)
Glucose-Capillary: 130 mg/dL — ABNORMAL HIGH (ref 70–99)
Glucose-Capillary: 225 mg/dL — ABNORMAL HIGH (ref 70–99)
Glucose-Capillary: 278 mg/dL — ABNORMAL HIGH (ref 70–99)
Glucose-Capillary: 162 mg/dL — ABNORMAL HIGH (ref 70–99)

## 2019-12-11 LAB — D-DIMER, QUANTITATIVE: D-Dimer, Quant: 2.25 ug/mL-FEU — ABNORMAL HIGH (ref 0.00–0.50)

## 2019-12-11 LAB — PHOSPHORUS: Phosphorus: 2.9 mg/dL (ref 2.5–4.6)

## 2019-12-11 LAB — MAGNESIUM: Magnesium: 2 mg/dL (ref 1.7–2.4)

## 2019-12-11 LAB — FERRITIN: Ferritin: 461 ng/mL — ABNORMAL HIGH (ref 11–307)

## 2019-12-11 LAB — C-REACTIVE PROTEIN: CRP: 0.8 mg/dL (ref <1.0)

## 2019-12-11 NOTE — Progress Notes (Signed)
Call placed to the patient's son to give updates. Message left on the answering machine.

## 2019-12-11 NOTE — TOC Progression Note (Addendum)
Transition of Care [facility]) - Progression Note    Patient Details  Name: Shirley [name] MRN: [id number] Date of Birth: 10-Apr-1934  Transition of Care [facility] CM/SW Contact  Elliot Gault, LCSW Phone Number: 12/11/2019, 3:11 PM  Clinical Narrative:     TOC following. Reviewed MD note from today stating pt to transfer to med/surg and possible dc tomorrow if stable.   Spoke with Toniann Fail at [facility] [phone]) to update. Toniann Fail indicated that they have been taking back patients of theirs with COVID but she will need assigned TOC to call her in the AM to confirm dc.  [employer] is currently waiving prior auth for SNF discharge.  Palliative Care APNP spoke with pt's son yesterday and he is agreeable. Referral made to [place] at [facility] today. They will follow up with pt/family after dc.  HIPPA compliant voicemail message left with son, Shirley [name], to update on dc planning.  TOC will follow.    Expected Discharge Plan: Skilled Nursing Facility Barriers to Discharge: Continued Medical Work up  Expected Discharge Plan and Services Expected Discharge Plan: Skilled Nursing Facility       Living arrangements for the past 2 months: Skilled Nursing Facility, Assisted Living Facility                                       Social Determinants of Health (SDOH) Interventions    Readmission Risk Interventions Readmission Risk Prevention Plan 12/10/2019  Transportation Screening Complete  Home Care Screening Not Complete  Home Care Screening Not Completed Comments Returning to SNF  Medication Review (RN CM) Complete  Some recent data might be hidden

## 2019-12-11 NOTE — Progress Notes (Signed)
PROGRESS NOTE    Shirley [name]  OHY:[id number] DOB: [birth year] DOA: 12/02/2019 PCP: Roderick Pee, MD (Inactive)    Brief Narrative:  86 year old female with history of dementia, hypertension, recent hospitalization presented from skilled nursing facility with poor oral intake, tachycardia tachypnea and oxygen saturation of 80s.  Patient is admitted with Covid pneumonia and HCAP.  Improved with antibiotics and steroids.  Currently on room air.  Oral intake is poor.   Assessment & Plan:   Principal Problem:   Healthcare-associated pneumonia Active Problems:   Essential hypertension   Polymyalgia rheumatica (H[facility])   Moderate dementia (H[facility])   Acute encephalopathy   Goals of care, counseling/discussion   DNR (do not resuscitate)   Pneumonia due to COVID-19 virus   Hypernatremia   Dementia with behavioral disturbance (H[facility])  Sepsis present on admission secondary to COVID-19 pneumonia and also suspected healthcare associated pneumonia.  Improved. Respiratory symptoms improved. Treated with broad-spectrum antibiotics, finished antibiotic therapy. Treated with 5 days of remdesivir, finished therapy.  Remains on his steroids for total 9/10 days.  Acute metabolic infective encephalopathy in the setting of underlying dementia and acute medical illness: Some confusion persist.  No focal deficits.  Patient on Aricept that she will continue.  Symptomatic treatment.  Hypertension: Blood pressure stable.  Supraventricular tachycardia: Still has intermittent tachycardia.  On metoprolol.  Poor oral intake: Very gradual improvement.  Able to take about 50% of the diet.  Advance care planning: Seen by palliative care.  Remains DNR/DNI.  Has poor oral intake.   DVT prophylaxis: Lovenox subcu Code Status: DNR Family Communication: None.  Nursing staff trying to reach to family Disposition Plan: Transfer to MedSurg bed.  Monitor oral intake.  If she has adequate oral intake, will transfer  back to skilled nursing facility tomorrow.   Consultants:   Palliative medicine  Procedures:   None  Antimicrobials:  Anti-infectives (From admission, onward)   Start     Dose/Rate Route Frequency Ordered Stop   12/04/19 1400  vancomycin (VANCOREADY) IVPB 1250 mg/250 mL  Status:  Discontinued     1,250 mg 166.7 mL/hr over 90 Minutes Intravenous Every 48 hours 12/02/19 1555 12/04/19 1232   12/04/19 1000  remdesivir 100 mg in sodium chloride 0.9 % 100 mL IVPB     100 mg 200 mL/hr over 30 Minutes Intravenous Daily 12/03/19 0912 12/07/19 1041   12/03/19 0915  remdesivir 200 mg in sodium chloride 0.9% 250 mL IVPB     200 mg 580 mL/hr over 30 Minutes Intravenous Once 12/03/19 0912 12/03/19 1117   12/03/19 0500  ceFEPIme (MAXIPIME) 2 g in sodium chloride 0.9 % 100 mL IVPB     2 g 200 mL/hr over 30 Minutes Intravenous Every 12 hours 12/02/19 1555 12/09/19 1757   12/02/19 1530  ceFEPIme (MAXIPIME) 2 g in sodium chloride 0.9 % 100 mL IVPB     2 g 200 mL/hr over 30 Minutes Intravenous  Once 12/02/19 1523 12/02/19 1758   12/02/19 1530  vancomycin (VANCOCIN) IVPB 1000 mg/200 mL premix     1,000 mg 200 mL/hr over 60 Minutes Intravenous  Once 12/02/19 1523 12/02/19 1758         Subjective: Patient seen and examined.  Poor historian.  No overnight events.  Had some nonsustained SVT in the morning that was asymptomatic. Patient was trying to say something, unable to explain.  Remains without fever.  Objective: Vitals:   12/10/19 2343 12/11/19 0410 12/11/19 0711 12/11/19 1152  BP: (!) 139/91  Marland Kitchen)  142/87 134/89  Pulse: 60     Resp: 18     Temp: 98.1 F (36.7 C) (!) 97.4 F (36.3 C) (!) 97.3 F (36.3 C) 97.8 F (36.6 C)  TempSrc: Axillary Axillary Axillary Axillary  SpO2: 92%  93%   Weight:      Height:        Intake/Output Summary (Last 24 hours) at 12/11/2019 1425 Last data filed at 12/11/2019 1300 Gross per 24 hour  Intake 597 ml  Output 575 ml  Net 22 ml   Filed Weights    12/02/19 1500  Weight: 59.5 kg    Examination:  General exam: Appears calm and comfortable , on room air.  Not in any distress. Respiratory system: Clear to auscultation. Respiratory effort normal. Cardiovascular system: S1 & S2 heard, RRR. Gastrointestinal system: Abdomen is nondistended, soft and nontender.  Central nervous system: Alert and oriented. No focal neurological deficits. Extremities: Moves all extremities. Skin: No rashes, lesions or ulcers Psychiatry: Judgement and insight appear compromised.  Pleasantly confused.    Data Reviewed: I have personally reviewed following labs and imaging studies  CBC: Recent Labs  Lab 12/07/19 0312 12/08/19 0148 12/09/19 0210 12/10/19 0255 12/11/19 0210  WBC 10.4 9.5 13.3* 13.4* 10.1  NEUTROABS 8.9* 8.5* 11.7* 11.3* 8.4*  HGB 14.5 14.5 14.2 13.7 14.6  HCT 44.6 45.2 43.1 42.4 44.9  MCV 89.9 90.4 91.1 90.8 88.4  PLT 211 225 236 232 218   Basic Metabolic Panel: Recent Labs  Lab 12/07/19 0312 12/08/19 0148 12/09/19 0210 12/10/19 0255 12/11/19 0739  NA 142 145 148* 140 139  K 3.5 4.2 3.9 3.6 4.0  CL 104 107 111 101 99  CO2 25 25 27 27 29   GLUCOSE 207* 276* 137* 98 120*  BUN 31* 39* 44* 36* 39*  CREATININE 0.63 0.63 0.69 0.40* 0.49  CALCIUM 8.3* 8.4* 8.7* 8.3* 8.3*  MG 2.2 2.4 2.2 2.0 2.0  PHOS 2.6 2.6 1.9* 1.8* 2.9   GFR: Estimated Creatinine Clearance: 47.9 mL/min (by C-G formula based on SCr of 0.49 mg/dL). Liver Function Tests: Recent Labs  Lab 12/07/19 0312 12/08/19 0148 12/09/19 0210 12/10/19 0255 12/11/19 0739  AST 114* 71* 39 34 35  ALT 78* 76* 63* 53* 47*  ALKPHOS 55 59 58 63 62  BILITOT 0.7 1.0 0.5 0.8 1.2  PROT 5.6* 5.6* 5.6* 5.4* 5.8*  ALBUMIN 2.6* 2.6* 2.5* 2.6* 2.7*   No results for input(s): LIPASE, AMYLASE in the last 168 hours. No results for input(s): AMMONIA in the last 168 hours. Coagulation Profile: No results for input(s): INR, PROTIME in the last 168 hours. Cardiac Enzymes: No  results for input(s): CKTOTAL, CKMB, CKMBINDEX, TROPONINI in the last 168 hours. BNP (last 3 results) No results for input(s): PROBNP in the last 8760 hours. HbA1C: No results for input(s): HGBA1C in the last 72 hours. CBG: Recent Labs  Lab 12/10/19 1946 12/10/19 2345 12/11/19 0355 12/11/19 0732 12/11/19 1137  GLUCAP 284* 180* 116* 130* 225*   Lipid Profile: No results for input(s): CHOL, HDL, LDLCALC, TRIG, CHOLHDL, LDLDIRECT in the last 72 hours. Thyroid Function Tests: No results for input(s): TSH, T4TOTAL, FREET4, T3FREE, THYROIDAB in the last 72 hours. Anemia Panel: Recent Labs    12/10/19 0255 12/11/19 0210  FERRITIN 393* 461*   Sepsis Labs: No results for input(s): PROCALCITON, LATICACIDVEN in the last 168 hours.  Recent Results (from the past 240 hour(s))  Culture, blood (routine x 2)     Status: None  Collection Time: 12/02/19  2:16 PM   Specimen: BLOOD  Result Value Ref Range Status   Specimen Description BLOOD LEFT ANTECUBITAL  Final   Special Requests   Final    BOTTLES DRAWN AEROBIC AND ANAEROBIC Blood Culture results may not be optimal due to an inadequate volume of blood received in culture bottles   Culture   Final    NO GROWTH 5 DAYS Performed at [facility], [address] [place], [place] [address]    Report Status 12/07/2019 FINAL  Final  Culture, blood (routine x 2)     Status: None   Collection Time: 12/02/19  2:21 PM   Specimen: BLOOD  Result Value Ref Range Status   Specimen Description BLOOD BLOOD RIGHT WRIST  Final   Special Requests   Final    BOTTLES DRAWN AEROBIC AND ANAEROBIC Blood Culture results may not be optimal due to an inadequate volume of blood received in culture bottles   Culture   Final    NO GROWTH 5 DAYS Performed at [facility], [address], [place], [place] [address]    Report Status 12/07/2019 FINAL  Final  Urine culture     Status: None   Collection Time: 12/02/19  7:05 PM   Specimen: Urine,  Random  Result Value Ref Range Status   Specimen Description URINE, RANDOM  Final   Special Requests NONE  Final   Culture   Final    NO GROWTH Performed at [facility], [address] [place], [place] [address]    Report Status 12/03/2019 FINAL  Final  Respiratory Panel by RT PCR (Flu A&B, Covid) - Nasopharyngeal Swab     Status: Abnormal   Collection Time: 12/02/19  7:05 PM   Specimen: Nasopharyngeal Swab  Result Value Ref Range Status   SARS Coronavirus 2 by RT PCR POSITIVE (A) NEGATIVE Final    Comment: RESULT CALLED TO, READ BACK BY AND VERIFIED WITH: Colbert Coyer RN 12/02/19 2024 JDW (NOTE) SARS-CoV-2 target nucleic acids are DETECTED. SARS-CoV-2 RNA is generally detectable in upper respiratory specimens  during the acute phase of infection. Positive results are indicative of the presence of the identified virus, but do not rule out bacterial infection or co-infection with other pathogens not detected by the test. Clinical correlation with patient history and other diagnostic information is necessary to determine patient infection status. The expected result is Negative. Fact Sheet for Patients:  https://www.moore.com/ Fact Sheet for Healthcare Providers: https://www.young.biz/ This test is not yet approved or cleared by the [place] FDA and  has been authorized for detection and/or diagnosis of SARS-CoV-2 by FDA under an Emergency Use Authorization (EUA).  This EUA will remain in effect (meaning this test can be used) for t he duration of  the COVID-19 declaration under Section 564(b)(1) of the Act, 21 U.S.C. section 360bbb-3(b)(1), unless the authorization is terminated or revoked sooner.    Influenza A by PCR NEGATIVE NEGATIVE Final   Influenza B by PCR NEGATIVE NEGATIVE Final    Comment: (NOTE) The Xpert Xpress SARS-CoV-2/FLU/RSV assay is intended as an aid in  the diagnosis of influenza from Nasopharyngeal swab  specimens and  should not be used as a sole basis for treatment. Nasal washings and  aspirates are unacceptable for Xpert Xpress SARS-CoV-2/FLU/RSV  testing. Fact Sheet for Patients: https://www.moore.com/ Fact Sheet for Healthcare Providers: https://www.young.biz/ This test is not yet approved or cleared by the [place] FDA and  has been  authorized for detection and/or diagnosis of SARS-CoV-2 by  FDA under an Emergency Use Authorization (EUA). This EUA will remain  in effect (meaning this test can be used) for the duration of the  Covid-19 declaration under Section 564(b)(1) of the Act, 21  U.S.C. section 360bbb-3(b)(1), unless the authorization is  terminated or revoked. Performed at [facility], [place] [address] [place], [place] [address]   MRSA PCR Screening     Status: None   Collection Time: 12/03/19  6:30 PM   Specimen: Nasopharyngeal  Result Value Ref Range Status   MRSA by PCR NEGATIVE NEGATIVE Final    Comment:        The GeneXpert MRSA Assay (FDA approved for NASAL specimens only), is one component of a comprehensive MRSA colonization surveillance program. It is not intended to diagnose MRSA infection nor to guide or monitor treatment for MRSA infections. Performed at [facility], [place] [address] [place], [place] [address]          Radiology Studies: No results found.      Scheduled Meds: . amLODipine  5 mg Oral Daily  . citalopram  10 mg Oral Daily  . dexamethasone (DECADRON) injection  6 mg Intravenous Q24H  . donepezil  10 mg Oral QHS  . enoxaparin (LOVENOX) injection  40 mg Subcutaneous Q24H  . feeding supplement (ENSURE ENLIVE)  237 mL Oral TID BM  . insulin aspart  0-20 Units Subcutaneous Q4H  . mouth rinse  15 mL Mouth Rinse BID  . metoprolol succinate  25 mg Oral QHS  . vitamin B-12  2,000 mcg Oral Daily   Continuous Infusions:   LOS: 9 days    Time spent: 25  minutes    Barb Merino, MD [facility] Pager [phone]

## 2019-12-12 LAB — CBC WITH DIFFERENTIAL/PLATELET
Abs Immature Granulocytes: 0.19 10*3/uL — ABNORMAL HIGH (ref 0.00–0.07)
Basophils Absolute: 0 10*3/uL (ref 0.0–0.1)
Basophils Relative: 0 %
Eosinophils Absolute: 0 10*3/uL (ref 0.0–0.5)
Eosinophils Relative: 0 %
HCT: 47.3 % — ABNORMAL HIGH (ref 36.0–46.0)
Hemoglobin: 15.3 g/dL — ABNORMAL HIGH (ref 12.0–15.0)
Immature Granulocytes: 2 %
Lymphocytes Relative: 7 %
Lymphs Abs: 0.8 10*3/uL (ref 0.7–4.0)
MCH: 29.4 pg (ref 26.0–34.0)
MCHC: 32.3 g/dL (ref 30.0–36.0)
MCV: 91 fL (ref 80.0–100.0)
Monocytes Absolute: 1.1 10*3/uL — ABNORMAL HIGH (ref 0.1–1.0)
Monocytes Relative: 9 %
Neutro Abs: 9.3 10*3/uL — ABNORMAL HIGH (ref 1.7–7.7)
Neutrophils Relative %: 82 %
Platelets: 235 10*3/uL (ref 150–400)
RBC: 5.2 MIL/uL — ABNORMAL HIGH (ref 3.87–5.11)
RDW: 14.1 % (ref 11.5–15.5)
WBC: 11.4 10*3/uL — ABNORMAL HIGH (ref 4.0–10.5)
nRBC: 0 % (ref 0.0–0.2)

## 2019-12-12 LAB — GLUCOSE, CAPILLARY
Glucose-Capillary: 103 mg/dL — ABNORMAL HIGH (ref 70–99)
Glucose-Capillary: 121 mg/dL — ABNORMAL HIGH (ref 70–99)
Glucose-Capillary: 204 mg/dL — ABNORMAL HIGH (ref 70–99)
Glucose-Capillary: 250 mg/dL — ABNORMAL HIGH (ref 70–99)
Glucose-Capillary: 71 mg/dL (ref 70–99)
Glucose-Capillary: 97 mg/dL (ref 70–99)

## 2019-12-12 LAB — C-REACTIVE PROTEIN: CRP: 0.6 mg/dL (ref <1.0)

## 2019-12-12 LAB — D-DIMER, QUANTITATIVE: D-Dimer, Quant: 2.66 ug/mL-FEU — ABNORMAL HIGH (ref 0.00–0.50)

## 2019-12-12 LAB — FERRITIN: Ferritin: 423 ng/mL — ABNORMAL HIGH (ref 11–307)

## 2019-12-12 LAB — MAGNESIUM: Magnesium: 2.1 mg/dL (ref 1.7–2.4)

## 2019-12-12 LAB — PHOSPHORUS: Phosphorus: 3.1 mg/dL (ref 2.5–4.6)

## 2019-12-12 MED ORDER — AMLODIPINE BESYLATE 5 MG PO TABS: 5.0000 mg | ORAL_TABLET | Freq: Every day | ORAL | Status: DC

## 2019-12-12 NOTE — Care Management (Signed)
CM spoke to Toniann Fail [facility] Admissions), the facility can accept the patient back, with a bed available on 12/13/19. CM updated Dr. Corrie Mckusick.  Colleen Can MSN, RN, NCM-BC, ACM-RN [phone]

## 2019-12-12 NOTE — Care Management Important Message (Signed)
Important Message  Patient Details  Name: Shirley [name] MRN: [id number] Date of Birth: 1934-04-08   Medicare Important Message Given:  Yes - Important Message mailed due to current National Emergency  Verbal consent obtained due to current National Emergency  Relationship to patient: Child Contact Name: Angelmarie Ponzo Call Date: 12/12/19  Time: 1126 Phone: [phone] Outcome: Spoke with contact Important Message mailed to: Patient address on file    Orson Aloe 12/12/2019, 11:26 AM

## 2019-12-12 NOTE — Discharge Summary (Addendum)
Physician Discharge Summary  ATTICUS LEMBERGER QPR:[id number] DOB: May 31, 1934 DOA: 12/02/2019  PCP: Roderick Pee, MD (Inactive)  Admit date: 12/02/2019 Discharge date: 12/13/2019  Admitted From: Skilled nursing facility Disposition: Skilled nursing facility  Recommendations for Outpatient Follow-up:  1. Follow up with PCP in 1-2 week  Discharge Condition: Stable CODE STATUS: DNR Diet recommendation: Dysphagia 1 diet, supervised feeding, supplemental nutrition needed.  Discharge summary: 86 year old female with history of dementia, hypertension, recent hospitalization presented from skilled nursing facility with poor oral intake, tachycardia ,tachypnea and oxygen saturation of 80s.  Patient is admitted with Covid pneumonia and HCAP.  Improved with antibiotics and steroids.  Currently on room air.  Oral intake is poor but improving.  Treated for following conditions.  1. Sepsis present on admission secondary to COVID-19 pneumonia and also suspected healthcare associated pneumonia.  Improved. Respiratory symptoms improved. Treated with broad-spectrum antibiotics, finished antibiotic therapy. Treated with 5 days of remdesivir, finished therapy.  She was also on steroids, she will finish therapy today.  2. Acute metabolic infective encephalopathy in the setting of underlying dementia and acute medical illness: Some confusion persist.  No focal deficits.  Patient on Aricept that she will continue.  Symptomatic treatment.  3.  Hypertension: Blood pressure stable.  Patient is on metoprolol.  Amlodipine was added in the hospital.  Blood pressures are acceptable.  4.  Supraventricular tachycardia: Heart rate is acceptable.  On metoprolol.  5.  Poor oral intake: Very gradual improvement.  Able to take about 50% of the diet.  Will need supervised feeding.  She will need supplements.  Advance care planning: Seen by palliative care.  Remains DNR/DNI.   Consult palliative care at a skilled  nursing facility.  Addendum: 12/13/2019, 10 AM Patient seen and examined.  She could not be discharged yesterday because facility could not accept her.  There is no change in his status.  Patient remains a stable for transfer. Discharge instructions, medications reviewed and no changes needed.   Discharge Diagnoses:  Principal Problem:   Healthcare-associated pneumonia Active Problems:   Essential hypertension   Polymyalgia rheumatica (HCC)   Moderate dementia (HCC)   Acute encephalopathy   Goals of care, counseling/discussion   DNR (do not resuscitate)   Pneumonia due to COVID-19 virus   Hypernatremia   Dementia with behavioral disturbance [facility])    Discharge Instructions  Discharge Instructions    Diet - low sodium heart healthy   Complete by: As directed    Encourage and assist feeding   Increase activity slowly   Complete by: As directed      Allergies as of 12/13/2019   No Known Allergies     Medication List    TAKE these medications   amLODipine 5 MG tablet Commonly known as: NORVASC Take 1 tablet (5 mg total) by mouth daily.   citalopram 10 MG tablet Commonly known as: CELEXA TAKE 2 TABLETS EVERY DAY What changed: how much to take   donepezil 10 MG tablet Commonly known as: ARICEPT TAKE 1 TABLET EVERY DAY What changed:   how much to take  how to take this  when to take this  additional instructions   metoprolol succinate 25 MG 24 hr tablet Commonly known as: TOPROL-XL Take 25 mg by mouth at bedtime.   triamcinolone cream 0.1 % Commonly known as: KENALOG Compound 1;1 with Eucerin cream and apply bid prn rash. What changed:   how much to take  how to take this  when to take this  additional instructions   vitamin B-12 1000 MCG tablet Commonly known as: CYANOCOBALAMIN Take 2 tablets (2,000 mcg total) by mouth daily.       No Known Allergies  Consultations:  Palliative care medicine   Procedures/Studies: CT Code Stroke CTA  Head W/WO contrast  Result Date: 11/23/2019 CLINICAL DATA:  Left-sided weakness. EXAM: CT ANGIOGRAPHY HEAD AND NECK CT PERFUSION BRAIN TECHNIQUE: Multidetector CT imaging of the head and neck was performed using the standard protocol during bolus administration of intravenous contrast. Multiplanar CT image reconstructions and MIPs were obtained to evaluate the vascular anatomy. Carotid stenosis measurements (when applicable) are obtained utilizing NASCET criteria, using the distal internal carotid diameter as the denominator. Multiphase CT imaging of the brain was performed following IV bolus contrast injection. Subsequent parametric perfusion maps were calculated using RAPID software. CONTRAST:  100mL OMNIPAQUE IOHEXOL 350 MG/ML SOLN COMPARISON:  None. FINDINGS: CTA NECK FINDINGS Aortic arch: Standard 3 vessel aortic arch with atherosclerotic plaque but no significant arch vessel origin stenosis. Small to moderate volume soft plaque partially visualized in the descending thoracic aorta. Aneurysmal dilatation of the ascending aorta with maximal diameter of 4.3 cm, new from a 2016 chest CT. Right carotid system: Patent with minimal calcified plaque at the carotid bifurcation. No evidence of stenosis or dissection. Left carotid system: Patent with heavily calcified plaque at the carotid bifurcation resulting in less than 50% stenosis of the distal common carotid artery and ICA origin. Vertebral arteries: Patent with the right being mildly dominant. No evidence of significant stenosis or dissection. Skeleton: Mild-to-moderate disc and moderate facet degeneration in the cervical spine. Mild superior endplate compression fractures/Schmorl's node deformities at T2, T3, T4, and T5, all chronic in appearance. Other neck: No evidence of cervical lymphadenopathy or mass. Upper chest: No apical lung consolidation or mass. Review of the MIP images confirms the above findings CTA HEAD FINDINGS Anterior circulation: The  internal carotid arteries are patent from skull base to carotid termini with mild nonstenotic calcified plaque bilaterally. ACAs and MCAs are patent without evidence of proximal branch occlusion or significant A1 or M1 stenosis, however there is prominent branch vessel atherosclerosis including severe stenoses of the M3 and more distal MCA branch vessels as well as distal A2 and more distal ACAs. No aneurysm is identified. Posterior circulation: The intracranial vertebral arteries are widely patent to the basilar. Patent PICA and SCA origins are visualized bilaterally. The basilar artery is widely patent. There are small right and large left posterior communicating arteries with a fetal origin of the left PCA. There are moderate stenoses of the left posterior communicating artery and both P2 segments, and they were severe bilateral P3 stenoses. No aneurysm is identified. Venous sinuses: Poorly evaluated due to contrast timing though grossly patent. Anatomic variants: Low fetal left PCA. Review of the MIP images confirms the above findings CT Brain Perfusion Findings: ASPECTS: 10 CBF (<30%) Volume: 0mL Perfusion (Tmax>6.0s) volume: 0mL Mismatch Volume: n/a Infarction Location: No infarct evident on CTP. IMPRESSION: 1. No emergent large vessel occlusion, and no evidence of significant ischemia on CTP. 2. Advanced intracranial atherosclerosis primarily involving the distal branch vessels. No flow limiting proximal intracranial stenosis. 3. Left greater than right cervical carotid artery atherosclerosis without significant stenosis. 4. Widely patent vertebral arteries. 5. 4.3 cm ascending aortic aneurysm. Recommend annual imaging followup by CTA or MRA. This recommendation follows 2010 ACCF/AHA/AATS/ACR/ASA/SCA/SCAI/SIR/STS/SVM Guidelines for the Diagnosis and Management of Patients with Thoracic Aortic Disease. Circulation. 2010; 121: Z610-R604: [id number]. Aortic aneurysm NOS (ICD10-I71.9) 6. Aortic  Atherosclerosis (ICD10-I70.0).  Preliminary findings of no emergent LVO and negative CTP were communicated to Dr. Rory Percy at 7:26 pm on 11/23/2019 by text page via the [facility] messaging system. Electronically Signed   By: Logan Bores M.D.   On: 11/23/2019 19:50   CT Code Stroke CTA Neck W/WO contrast  Result Date: 11/23/2019 CLINICAL DATA:  Left-sided weakness. EXAM: CT ANGIOGRAPHY HEAD AND NECK CT PERFUSION BRAIN TECHNIQUE: Multidetector CT imaging of the head and neck was performed using the standard protocol during bolus administration of intravenous contrast. Multiplanar CT image reconstructions and MIPs were obtained to evaluate the vascular anatomy. Carotid stenosis measurements (when applicable) are obtained utilizing NASCET criteria, using the distal internal carotid diameter as the denominator. Multiphase CT imaging of the brain was performed following IV bolus contrast injection. Subsequent parametric perfusion maps were calculated using RAPID software. CONTRAST:  181mL OMNIPAQUE IOHEXOL 350 MG/ML SOLN COMPARISON:  None. FINDINGS: CTA NECK FINDINGS Aortic arch: Standard 3 vessel aortic arch with atherosclerotic plaque but no significant arch vessel origin stenosis. Small to moderate volume soft plaque partially visualized in the descending thoracic aorta. Aneurysmal dilatation of the ascending aorta with maximal diameter of 4.3 cm, new from a 2016 chest CT. Right carotid system: Patent with minimal calcified plaque at the carotid bifurcation. No evidence of stenosis or dissection. Left carotid system: Patent with heavily calcified plaque at the carotid bifurcation resulting in less than 50% stenosis of the distal common carotid artery and ICA origin. Vertebral arteries: Patent with the right being mildly dominant. No evidence of significant stenosis or dissection. Skeleton: Mild-to-moderate disc and moderate facet degeneration in the cervical spine. Mild superior endplate compression fractures/Schmorl's node deformities at T2, T3, T4,  and T5, all chronic in appearance. Other neck: No evidence of cervical lymphadenopathy or mass. Upper chest: No apical lung consolidation or mass. Review of the MIP images confirms the above findings CTA HEAD FINDINGS Anterior circulation: The internal carotid arteries are patent from skull base to carotid termini with mild nonstenotic calcified plaque bilaterally. ACAs and MCAs are patent without evidence of proximal branch occlusion or significant A1 or M1 stenosis, however there is prominent branch vessel atherosclerosis including severe stenoses of the M3 and more distal MCA branch vessels as well as distal A2 and more distal ACAs. No aneurysm is identified. Posterior circulation: The intracranial vertebral arteries are widely patent to the basilar. Patent PICA and SCA origins are visualized bilaterally. The basilar artery is widely patent. There are small right and large left posterior communicating arteries with a fetal origin of the left PCA. There are moderate stenoses of the left posterior communicating artery and both P2 segments, and they were severe bilateral P3 stenoses. No aneurysm is identified. Venous sinuses: Poorly evaluated due to contrast timing though grossly patent. Anatomic variants: Low fetal left PCA. Review of the MIP images confirms the above findings CT Brain Perfusion Findings: ASPECTS: 10 CBF (<30%) Volume: 93mL Perfusion (Tmax>6.0s) volume: 65mL Mismatch Volume: n/a Infarction Location: No infarct evident on CTP. IMPRESSION: 1. No emergent large vessel occlusion, and no evidence of significant ischemia on CTP. 2. Advanced intracranial atherosclerosis primarily involving the distal branch vessels. No flow limiting proximal intracranial stenosis. 3. Left greater than right cervical carotid artery atherosclerosis without significant stenosis. 4. Widely patent vertebral arteries. 5. 4.3 cm ascending aortic aneurysm. Recommend annual imaging followup by CTA or MRA. This recommendation follows  2010 ACCF/AHA/AATS/ACR/ASA/SCA/SCAI/SIR/STS/SVM Guidelines for the Diagnosis and Management of Patients with Thoracic Aortic Disease. Circulation. 2010; 121: L275-T700.  Aortic aneurysm NOS (ICD10-I71.9) 6. Aortic Atherosclerosis (ICD10-I70.0). Preliminary findings of no emergent LVO and negative CTP were communicated to Dr. Wilford Corner at 7:26 pm on 11/23/2019 by text page via the [facility] messaging system. Electronically Signed   By: Sebastian Ache M.D.   On: 11/23/2019 19:50   MR BRAIN WO CONTRAST  Result Date: 11/24/2019 CLINICAL DATA:  Encephalopathy. Increased confusion and left-sided weakness. History of dementia. EXAM: MRI HEAD WITHOUT CONTRAST TECHNIQUE: Multiplanar, multiecho pulse sequences of the brain and surrounding structures were obtained without intravenous contrast. COMPARISON:  Head CT, CTA, and CTP 11/23/2019. Head MRI 01/30/2013. FINDINGS: Brain: No acute infarct, mass, midline shift, or extra-axial fluid collection is identified. There is a single chronic microhemorrhage in the parasagittal right frontoparietal region/cingulate gyrus. Patchy to confluent T2 hyperintensities primarily involve the periventricular and deep cerebral white matter bilaterally and have progressed from 2014, nonspecific but compatible with moderate chronic small vessel ischemic disease. Ventriculomegaly has progressed from 2014 and is favored to reflect advanced central predominant cerebral atrophy rather than hydrocephalus, with severe bilateral mesial temporal lobe atrophy noted. Vascular: Major intracranial vascular flow voids are preserved. Skull and upper cervical spine: Unremarkable bone marrow signal. Sinuses/Orbits: Bilateral cataract extraction. Left greater than right maxillary sinus mucosal thickening with moderate volume fluid on the left. Mild bilateral ethmoid air cell mucosal thickening. Clear mastoid air cells. Other: None. IMPRESSION: 1. No acute intracranial abnormality. 2. Moderate chronic small vessel  ischemic disease, progressed from 2014. 3. Advanced cerebral atrophy with severe bilateral mesial temporal lobe atrophy, progressed from 2014. Electronically Signed   By: Sebastian Ache M.D.   On: 11/24/2019 14:55   CT Code Stroke Cerebral Perfusion with contrast  Result Date: 11/23/2019 CLINICAL DATA:  Left-sided weakness. EXAM: CT ANGIOGRAPHY HEAD AND NECK CT PERFUSION BRAIN TECHNIQUE: Multidetector CT imaging of the head and neck was performed using the standard protocol during bolus administration of intravenous contrast. Multiplanar CT image reconstructions and MIPs were obtained to evaluate the vascular anatomy. Carotid stenosis measurements (when applicable) are obtained utilizing NASCET criteria, using the distal internal carotid diameter as the denominator. Multiphase CT imaging of the brain was performed following IV bolus contrast injection. Subsequent parametric perfusion maps were calculated using RAPID software. CONTRAST:  OMNIPAQUE IOHEXOL 350 MG/ML SOLN COMPARISON:  None. FINDINGS: CTA NECK FINDINGS Aortic arch: Standard 3 vessel aortic arch with atherosclerotic plaque but no significant arch vessel origin stenosis. Small to moderate volume soft plaque partially visualized in the descending thoracic aorta. Aneurysmal dilatation of the ascending aorta with maximal diameter of 4.3 cm, new from a 2016 chest CT. Right carotid system: Patent with minimal calcified plaque at the carotid bifurcation. No evidence of stenosis or dissection. Left carotid system: Patent with heavily calcified plaque at the carotid bifurcation resulting in less than 50% stenosis of the distal common carotid artery and ICA origin. Vertebral arteries: Patent with the right being mildly dominant. No evidence of significant stenosis or dissection. Skeleton: Mild-to-moderate disc and moderate facet degeneration in the cervical spine. Mild superior endplate compression fractures/Schmorl's node deformities at T2, T3, T4, and  T5, all chronic in appearance. Other neck: No evidence of cervical lymphadenopathy or mass. Upper chest: No apical lung consolidation or mass. Review of the MIP images confirms the above findings CTA HEAD FINDINGS Anterior circulation: The internal carotid arteries are patent from skull base to carotid termini with mild nonstenotic calcified plaque bilaterally. ACAs and MCAs are patent without evidence of proximal branch occlusion or significant A1 or M1  stenosis, however there is prominent branch vessel atherosclerosis including severe stenoses of the M3 and more distal MCA branch vessels as well as distal A2 and more distal ACAs. No aneurysm is identified. Posterior circulation: The intracranial vertebral arteries are widely patent to the basilar. Patent PICA and SCA origins are visualized bilaterally. The basilar artery is widely patent. There are small right and large left posterior communicating arteries with a fetal origin of the left PCA. There are moderate stenoses of the left posterior communicating artery and both P2 segments, and they were severe bilateral P3 stenoses. No aneurysm is identified. Venous sinuses: Poorly evaluated due to contrast timing though grossly patent. Anatomic variants: Low fetal left PCA. Review of the MIP images confirms the above findings CT Brain Perfusion Findings: ASPECTS: 10 CBF (<30%) Volume: 0mL Perfusion (Tmax>6.0s) volume: 0mL Mismatch Volume: n/a Infarction Location: No infarct evident on CTP. IMPRESSION: 1. No emergent large vessel occlusion, and no evidence of significant ischemia on CTP. 2. Advanced intracranial atherosclerosis primarily involving the distal branch vessels. No flow limiting proximal intracranial stenosis. 3. Left greater than right cervical carotid artery atherosclerosis without significant stenosis. 4. Widely patent vertebral arteries. 5. 4.3 cm ascending aortic aneurysm. Recommend annual imaging followup by CTA or MRA. This recommendation follows  2010 ACCF/AHA/AATS/ACR/ASA/SCA/SCAI/SIR/STS/SVM Guidelines for the Diagnosis and Management of Patients with Thoracic Aortic Disease. Circulation. 2010; 121: Z610-R604. Aortic aneurysm NOS (ICD10-I71.9) 6. Aortic Atherosclerosis (ICD10-I70.0). Preliminary findings of no emergent LVO and negative CTP were communicated to Dr. Wilford Corner at 7:26 pm on 11/23/2019 by text page via the [facility] messaging system. Electronically Signed   By: Sebastian Ache M.D.   On: 11/23/2019 19:50   DG CHEST PORT 1 VIEW  Result Date: 12/07/2019 CLINICAL DATA:  Hypoxia. EXAM: PORTABLE CHEST 1 VIEW COMPARISON:  December 02, 2019. FINDINGS: Stable cardiomegaly. No pneumothorax or pleural effusion is noted. Status post right total shoulder arthroplasty. Increased left midlung opacity is noted with increased right basilar opacity concerning for worsening multifocal pneumonia. IMPRESSION: Increased left midlung opacity is noted with increased right basilar opacity concerning for worsening multifocal pneumonia. Electronically Signed   By: Lupita Raider M.D.   On: 12/07/2019 08:37   DG Chest Portable 1 View  Result Date: 12/02/2019 CLINICAL DATA:  Hypoxia EXAM: PORTABLE CHEST 1 VIEW COMPARISON:  November 23, 2019 FINDINGS: There is somewhat ill-defined airspace opacity in the right lower lung region. There is mild atelectatic change in the right mid and lower lung regions as well as in the left mid lung and left base regions without frank consolidation. Heart is upper normal in size with pulmonary vascularity normal. No adenopathy. There is a fairly sizable hiatal hernia. There is aortic atherosclerosis. No adenopathy. There is a total shoulder replacement on the right. IMPRESSION: Ill-defined airspace opacity on the right in the right lower lung region, consistent with developing pneumonia. Areas of mild atelectatic change in each mid and lower lung zone region Heart size within normal limits. Aortic Atherosclerosis (ICD10-I70.0). Hiatal hernia  present. Electronically Signed   By: Bretta Bang III M.D.   On: 12/02/2019 14:42   DG Chest Portable 1 View  Result Date: 11/23/2019 CLINICAL DATA:  Acute shortness of breath EXAM: PORTABLE CHEST 1 VIEW COMPARISON:  07/06/2015 chest radiograph FINDINGS: UPPER limits normal heart size again noted. LEFT retrocardiac opacity appears similar to remote radiograph, but may represent atelectasis, scarring or airspace disease/consolidation. No definite pleural effusion or pneumothorax. No acute bony abnormalities. RIGHT shoulder arthroplasty changes noted. IMPRESSION: LEFT  retrocardiac opacity which appears somewhat remote radiograph, but may represent atelectasis, scarring or airspace disease/consolidation. Electronically Signed   By: Harmon PierJeffrey  Hu M.D.   On: 11/23/2019 20:13   CT HEAD CODE STROKE WO CONTRAST  Result Date: 11/23/2019 CLINICAL DATA:  Code stroke. Left-sided weakness and slurred speech. EXAM: CT HEAD WITHOUT CONTRAST TECHNIQUE: Contiguous axial images were obtained from the base of the skull through the vertex without intravenous contrast. COMPARISON:  12/18/2018 FINDINGS: Brain: There is no evidence of acute infarct, intracranial hemorrhage, mass, midline shift, or extra-axial fluid collection. Patchy to confluent hypodensities in the cerebral white matter bilaterally are unchanged and nonspecific but compatible with moderate chronic small vessel ischemic disease. Mild-to-moderate lateral and third ventriculomegaly is unchanged and may reflect central predominant cerebral atrophy although normal pressure hydrocephalus is not excluded in the appropriate clinical setting. Vascular: Calcified atherosclerosis at the skull base. No hyperdense vessel. Skull: No fracture or focal osseous lesion. Sinuses/Orbits: Left greater than right maxillary sinus mucosal thickening with moderate volume fluid on the left. Mild bilateral ethmoid air cell mucosal thickening. Clear mastoid air cells bilateral  cataract extraction. Other: None. ASPECTS [facility]([place] Stroke Program Early CT Score) - Ganglionic level infarction (caudate, lentiform nuclei, internal capsule, insula, M1-M3 cortex): 7 - Supraganglionic infarction (M4-M6 cortex): 3 Total score (0-10 with 10 being normal): 10 IMPRESSION: 1. No evidence of acute intracranial abnormality. 2. ASPECTS is 10. 3. Moderate chronic small vessel ischemic disease and unchanged ventriculomegaly. These results were communicated to Dr. Laurence SlateAroor at 7:02 pm on 11/23/2019 by text page via the [facility] messaging system. Electronically Signed   By: Sebastian AcheAllen  Grady M.D.   On: 11/23/2019 19:02    Subjective: Patient seen and examined.  No overnight events.  Remains pleasantly confused.  On room air.  Afebrile.  Has been consuming about more than 50% of the oral diet with supervision and assist.   Discharge Exam: Vitals:   12/13/19 0720 12/13/19 0733  BP: 128/83   Pulse: 66   Resp: 16   Temp: 98.1 F (36.7 C) (!) 96.9 F (36.1 C)  SpO2: 92%    Vitals:   12/12/19 2200 12/13/19 0500 12/13/19 0720 12/13/19 0733  BP: (!) 152/99 (!) 143/74 128/83   Pulse: (!) 58 60 66   Resp: 20 18 16    Temp: (!) 96.4 F (35.8 C) (!) 96.2 F (35.7 C) 98.1 F (36.7 C) (!) 96.9 F (36.1 C)  TempSrc: Rectal Axillary Oral Axillary  SpO2: 96% 93% 92%   Weight:      Height:        General: Pt is alert, awake, not in acute distress, on room air.  Pleasantly confused. Cardiovascular: RRR, S1/S2 +, no rubs, no gallops Respiratory: CTA bilaterally, no wheezing, no rhonchi Abdominal: Soft, NT, ND, bowel sounds + Extremities: no edema, no cyanosis    The results of significant diagnostics from this hospitalization (including imaging, microbiology, ancillary and laboratory) are listed below for reference.     Microbiology: Recent Results (from the past 240 hour(s))  MRSA PCR Screening     Status: None   Collection Time: 12/03/19  6:30 PM   Specimen: Nasopharyngeal  Result Value Ref  Range Status   MRSA by PCR NEGATIVE NEGATIVE Final    Comment:        The GeneXpert MRSA Assay (FDA approved for NASAL specimens only), is one component of a comprehensive MRSA colonization surveillance program. It is not intended to diagnose MRSA infection nor to guide or monitor treatment  for MRSA infections. Performed at [facility], [address], [place], [place] [address]      Labs: BNP (last 3 results) No results for input(s): BNP in the last 8760 hours. Basic Metabolic Panel: Recent Labs  Lab 12/07/19 0312 12/08/19 0148 12/09/19 0210 12/10/19 0255 12/11/19 0739 12/12/19 0405 12/13/19 0003  NA 142 145 148* 140 139  --   --   K 3.5 4.2 3.9 3.6 4.0  --   --   CL 104 107 111 101 99  --   --   CO2 25 25 27 27 29   --   --   GLUCOSE 207* 276* 137* 98 120*  --   --   BUN 31* 39* 44* 36* 39*  --   --   CREATININE 0.63 0.63 0.69 0.40* 0.49  --   --   CALCIUM 8.3* 8.4* 8.7* 8.3* 8.3*  --   --   MG 2.2 2.4 2.2 2.0 2.0 2.1 2.1  PHOS 2.6 2.6 1.9* 1.8* 2.9 3.1 3.6   Liver Function Tests: Recent Labs  Lab 12/07/19 0312 12/08/19 0148 12/09/19 0210 12/10/19 0255 12/11/19 0739  AST 114* 71* 39 34 35  ALT 78* 76* 63* 53* 47*  ALKPHOS 55 59 58 63 62  BILITOT 0.7 1.0 0.5 0.8 1.2  PROT 5.6* 5.6* 5.6* 5.4* 5.8*  ALBUMIN 2.6* 2.6* 2.5* 2.6* 2.7*   No results for input(s): LIPASE, AMYLASE in the last 168 hours. No results for input(s): AMMONIA in the last 168 hours. CBC: Recent Labs  Lab 12/09/19 0210 12/10/19 0255 12/11/19 0210 12/12/19 0405 12/13/19 0003  WBC 13.3* 13.4* 10.1 11.4* 9.5  NEUTROABS 11.7* 11.3* 8.4* 9.3* 8.1*  HGB 14.2 13.7 14.6 15.3* 15.0  HCT 43.1 42.4 44.9 47.3* 46.4*  MCV 91.1 90.8 88.4 91.0 92.1  PLT 236 232 218 235 200   Cardiac Enzymes: No results for input(s): CKTOTAL, CKMB, CKMBINDEX, TROPONINI in the last 168 hours. BNP: Invalid input(s): POCBNP CBG: Recent Labs  Lab 12/12/19 1553 12/12/19 2216 12/13/19 0501  12/13/19 0529 12/13/19 0719  GLUCAP 250* 204* 40* 162* 135*   D-Dimer Recent Labs    12/12/19 0405 12/13/19 0003  DDIMER 2.66* 2.04*   Hgb A1c No results for input(s): HGBA1C in the last 72 hours. Lipid Profile No results for input(s): CHOL, HDL, LDLCALC, TRIG, CHOLHDL, LDLDIRECT in the last 72 hours. Thyroid function studies No results for input(s): TSH, T4TOTAL, T3FREE, THYROIDAB in the last 72 hours.  Invalid input(s): FREET3 Anemia work up Recent Labs    12/12/19 0405 12/13/19 0003  FERRITIN 423* 413*   Urinalysis    Component Value Date/Time   COLORURINE AMBER (A) 12/02/2019 1859   APPEARANCEUR HAZY (A) 12/02/2019 1859   LABSPEC 1.029 12/02/2019 1859   PHURINE 5.0 12/02/2019 1859   GLUCOSEU NEGATIVE 12/02/2019 1859   HGBUR NEGATIVE 12/02/2019 1859   HGBUR trace-intact 05/24/2010 0956   BILIRUBINUR NEGATIVE 12/02/2019 1859   BILIRUBINUR n 08/23/2016 1246   KETONESUR NEGATIVE 12/02/2019 1859   PROTEINUR 100 (A) 12/02/2019 1859   UROBILINOGEN 0.2 08/23/2016 1246   UROBILINOGEN 1.0 07/04/2015 1925   NITRITE NEGATIVE 12/02/2019 1859   LEUKOCYTESUR NEGATIVE 12/02/2019 1859   Sepsis Labs Invalid input(s): PROCALCITONIN,  WBC,  LACTICIDVEN Microbiology Recent Results (from the past 240 hour(s))  MRSA PCR Screening     Status: None   Collection Time: 12/03/19  6:30 PM   Specimen: Nasopharyngeal  Result Value Ref Range Status   MRSA by  PCR NEGATIVE NEGATIVE Final    Comment:        The GeneXpert MRSA Assay (FDA approved for NASAL specimens only), is one component of a comprehensive MRSA colonization surveillance program. It is not intended to diagnose MRSA infection nor to guide or monitor treatment for MRSA infections. Performed at [facility], [address] [place], [place] [address]      Time coordinating discharge:  35 minutes  SIGNED:   Dorcas Carrow, MD  [facility] 12/13/2019, 10:05 AM

## 2019-12-13 LAB — CBC WITH DIFFERENTIAL/PLATELET
Abs Immature Granulocytes: 0.1 K/uL — ABNORMAL HIGH (ref 0.00–0.07)
Basophils Absolute: 0 K/uL (ref 0.0–0.1)
Basophils Relative: 0 %
Eosinophils Absolute: 0 K/uL (ref 0.0–0.5)
Eosinophils Relative: 0 %
HCT: 46.4 % — ABNORMAL HIGH (ref 36.0–46.0)
Hemoglobin: 15 g/dL (ref 12.0–15.0)
Immature Granulocytes: 1 %
Lymphocytes Relative: 7 %
Lymphs Abs: 0.6 K/uL — ABNORMAL LOW (ref 0.7–4.0)
MCH: 29.8 pg (ref 26.0–34.0)
MCHC: 32.3 g/dL (ref 30.0–36.0)
MCV: 92.1 fL (ref 80.0–100.0)
Monocytes Absolute: 0.7 K/uL (ref 0.1–1.0)
Monocytes Relative: 7 %
Neutro Abs: 8.1 K/uL — ABNORMAL HIGH (ref 1.7–7.7)
Neutrophils Relative %: 85 %
Platelets: 200 K/uL (ref 150–400)
RBC: 5.04 MIL/uL (ref 3.87–5.11)
RDW: 14.5 % (ref 11.5–15.5)
WBC: 9.5 K/uL (ref 4.0–10.5)
nRBC: 0 % (ref 0.0–0.2)

## 2019-12-13 LAB — MAGNESIUM: Magnesium: 2.1 mg/dL (ref 1.7–2.4)

## 2019-12-13 LAB — FERRITIN: Ferritin: 413 ng/mL — ABNORMAL HIGH (ref 11–307)

## 2019-12-13 LAB — D-DIMER, QUANTITATIVE: D-Dimer, Quant: 2.04 ug/mL-FEU — ABNORMAL HIGH (ref 0.00–0.50)

## 2019-12-13 LAB — PHOSPHORUS: Phosphorus: 3.6 mg/dL (ref 2.5–4.6)

## 2019-12-13 LAB — GLUCOSE, CAPILLARY
Glucose-Capillary: 162 mg/dL — ABNORMAL HIGH (ref 70–99)
Glucose-Capillary: 40 mg/dL — CL (ref 70–99)
Glucose-Capillary: 135 mg/dL — ABNORMAL HIGH (ref 70–99)
Glucose-Capillary: 125 mg/dL — ABNORMAL HIGH (ref 70–99)

## 2019-12-13 LAB — C-REACTIVE PROTEIN: CRP: 0.6 mg/dL

## 2019-12-13 NOTE — Progress Notes (Signed)
Called [facility] at [phone] to give report x 3. No answer. Called son Tawanna Cooler to let him know about DC. Son did not have another number for the nursing home.

## 2019-12-13 NOTE — Progress Notes (Signed)
PTAR here to get patient. PIV removed and belongings packed. No distress noted.

## 2019-12-13 NOTE — TOC Transition Note (Signed)
Transition of Care [facility]) - CM/SW Discharge Note   Patient Details  Name: Shirley [name] MRN: [id number] Date of Birth: 1934-01-22  Transition of Care [facility]) CM/SW Contact:  Colleen Can MSN, RN, NCM-BC, ACM-RN [phone] Phone Number: 12/13/2019, 10:59 AM   Clinical Narrative:    Patient is medically stable to transition back to [facility]. DC paperwork sent to [facility] (Admissions); Patients son [name], was made aware of the POC and verbalized understanding. PTAR will be arranged for noon.   Please call report to: [phone]  Final next level of care: Skilled Nursing Facility Barriers to Discharge: No Barriers Identified   Patient Goals and CMS Choice        Discharge Placement              Patient chooses bed at: [facility] Patient to be transferred to facility by: PTAR Name of family member notified: [name] (son) Patient and family notified of of transfer: 12/13/19  Discharge Plan and Services   Discharge Planning Services: Other - See comment(outpatient Palliative ([facility]))            DME Arranged: N/A DME Agency: NA       HH Arranged: NA HH Agency: NA        Social Determinants of Health (SDOH) Interventions     Readmission Risk Interventions Readmission Risk Prevention Plan 12/10/2019  Transportation Screening Complete  Home Care Screening Not Complete  Home Care Screening Not Completed Comments Returning to SNF  Medication Review (RN CM) Complete  Some recent data might be hidden

## 2019-12-30 ENCOUNTER — Inpatient Hospital Stay (HOSPITAL_COMMUNITY)
Admission: EM | Admit: 2019-12-30 | Discharge: 2020-01-03 | DRG: 175 | Disposition: A | Discharge status: Skilled Nursing Facility | Payer: Medicare PPO | Attending: Internal Medicine | Admitting: Internal Medicine

## 2019-12-30 ENCOUNTER — Encounter (HOSPITAL_COMMUNITY): Payer: Self-pay

## 2019-12-30 ENCOUNTER — Emergency Department (HOSPITAL_COMMUNITY): Payer: Medicare PPO

## 2019-12-30 ENCOUNTER — Other Ambulatory Visit: Payer: Self-pay

## 2019-12-30 DIAGNOSIS — J189 Pneumonia, unspecified organism: Secondary | ICD-10-CM | POA: Diagnosis present

## 2019-12-30 DIAGNOSIS — Z66 Do not resuscitate: Secondary | ICD-10-CM | POA: Diagnosis present

## 2019-12-30 DIAGNOSIS — U071 COVID-19: Secondary | ICD-10-CM

## 2019-12-30 DIAGNOSIS — M353 Polymyalgia rheumatica: Secondary | ICD-10-CM | POA: Diagnosis present

## 2019-12-30 DIAGNOSIS — I4819 Other persistent atrial fibrillation: Secondary | ICD-10-CM | POA: Diagnosis not present

## 2019-12-30 DIAGNOSIS — Z87891 Personal history of nicotine dependence: Secondary | ICD-10-CM | POA: Diagnosis not present

## 2019-12-30 DIAGNOSIS — F419 Anxiety disorder, unspecified: Secondary | ICD-10-CM | POA: Diagnosis present

## 2019-12-30 DIAGNOSIS — B948 Sequelae of other specified infectious and parasitic diseases: Secondary | ICD-10-CM

## 2019-12-30 DIAGNOSIS — F0391 Unspecified dementia with behavioral disturbance: Secondary | ICD-10-CM | POA: Diagnosis present

## 2019-12-30 DIAGNOSIS — Z79899 Other long term (current) drug therapy: Secondary | ICD-10-CM

## 2019-12-30 DIAGNOSIS — I4891 Unspecified atrial fibrillation: Secondary | ICD-10-CM

## 2019-12-30 DIAGNOSIS — G9341 Metabolic encephalopathy: Secondary | ICD-10-CM | POA: Diagnosis present

## 2019-12-30 DIAGNOSIS — F0390 Unspecified dementia without behavioral disturbance: Secondary | ICD-10-CM | POA: Diagnosis not present

## 2019-12-30 DIAGNOSIS — R0989 Other specified symptoms and signs involving the circulatory and respiratory systems: Secondary | ICD-10-CM | POA: Diagnosis not present

## 2019-12-30 DIAGNOSIS — Y95 Nosocomial condition: Secondary | ICD-10-CM | POA: Diagnosis present

## 2019-12-30 DIAGNOSIS — I712 Thoracic aortic aneurysm, without rupture: Secondary | ICD-10-CM | POA: Diagnosis present

## 2019-12-30 DIAGNOSIS — I2699 Other pulmonary embolism without acute cor pulmonale: Principal | ICD-10-CM | POA: Diagnosis present

## 2019-12-30 DIAGNOSIS — R4182 Altered mental status, unspecified: Secondary | ICD-10-CM

## 2019-12-30 DIAGNOSIS — Z981 Arthrodesis status: Secondary | ICD-10-CM

## 2019-12-30 DIAGNOSIS — I10 Essential (primary) hypertension: Secondary | ICD-10-CM | POA: Diagnosis present

## 2019-12-30 DIAGNOSIS — Z90710 Acquired absence of both cervix and uterus: Secondary | ICD-10-CM | POA: Diagnosis not present

## 2019-12-30 DIAGNOSIS — I2510 Atherosclerotic heart disease of native coronary artery without angina pectoris: Secondary | ICD-10-CM | POA: Diagnosis present

## 2019-12-30 DIAGNOSIS — Z8249 Family history of ischemic heart disease and other diseases of the circulatory system: Secondary | ICD-10-CM | POA: Diagnosis not present

## 2019-12-30 DIAGNOSIS — I959 Hypotension, unspecified: Secondary | ICD-10-CM | POA: Diagnosis present

## 2019-12-30 DIAGNOSIS — Z96611 Presence of right artificial shoulder joint: Secondary | ICD-10-CM | POA: Diagnosis present

## 2019-12-30 DIAGNOSIS — M810 Age-related osteoporosis without current pathological fracture: Secondary | ICD-10-CM | POA: Diagnosis present

## 2019-12-30 DIAGNOSIS — R296 Repeated falls: Secondary | ICD-10-CM | POA: Diagnosis present

## 2019-12-30 DIAGNOSIS — G9340 Encephalopathy, unspecified: Secondary | ICD-10-CM | POA: Diagnosis present

## 2019-12-30 LAB — CBC WITH DIFFERENTIAL/PLATELET
Abs Immature Granulocytes: 0.03 10*3/uL (ref 0.00–0.07)
Basophils Absolute: 0.1 10*3/uL (ref 0.0–0.1)
Basophils Relative: 1 %
Eosinophils Absolute: 0.2 10*3/uL (ref 0.0–0.5)
Eosinophils Relative: 3 %
HCT: 47.4 % — ABNORMAL HIGH (ref 36.0–46.0)
Hemoglobin: 15.3 g/dL — ABNORMAL HIGH (ref 12.0–15.0)
Immature Granulocytes: 0 %
Lymphocytes Relative: 13 %
Lymphs Abs: 0.9 10*3/uL (ref 0.7–4.0)
MCH: 30.7 pg (ref 26.0–34.0)
MCHC: 32.3 g/dL (ref 30.0–36.0)
MCV: 95.2 fL (ref 80.0–100.0)
Monocytes Absolute: 0.9 10*3/uL (ref 0.1–1.0)
Monocytes Relative: 13 %
Neutro Abs: 5 10*3/uL (ref 1.7–7.7)
Neutrophils Relative %: 70 %
Platelets: 211 10*3/uL (ref 150–400)
RBC: 4.98 MIL/uL (ref 3.87–5.11)
RDW: 15.6 % — ABNORMAL HIGH (ref 11.5–15.5)
WBC: 7.1 10*3/uL (ref 4.0–10.5)
nRBC: 0 % (ref 0.0–0.2)

## 2019-12-30 LAB — URINALYSIS, ROUTINE W REFLEX MICROSCOPIC
Bacteria, UA: NONE SEEN
Bilirubin Urine: NEGATIVE
Glucose, UA: 50 mg/dL — AB
Hgb urine dipstick: NEGATIVE
Ketones, ur: NEGATIVE mg/dL
Leukocytes,Ua: NEGATIVE
Nitrite: NEGATIVE
Protein, ur: 100 mg/dL — AB
Specific Gravity, Urine: 1.02 (ref 1.005–1.030)
pH: 5 (ref 5.0–8.0)

## 2019-12-30 LAB — COMPREHENSIVE METABOLIC PANEL
ALT: 20 U/L (ref 0–44)
AST: 24 U/L (ref 15–41)
Albumin: 2.8 g/dL — ABNORMAL LOW (ref 3.5–5.0)
Alkaline Phosphatase: 82 U/L (ref 38–126)
Anion gap: 15 (ref 5–15)
BUN: 13 mg/dL (ref 8–23)
CO2: 22 mmol/L (ref 22–32)
Calcium: 9 mg/dL (ref 8.9–10.3)
Chloride: 99 mmol/L (ref 98–111)
Creatinine, Ser: 0.8 mg/dL (ref 0.44–1.00)
GFR calc Af Amer: >60 mL/min (ref >60)
GFR calc non Af Amer: >60 mL/min (ref >60)
Glucose, Bld: 182 mg/dL — ABNORMAL HIGH (ref 70–99)
Potassium: 3.7 mmol/L (ref 3.5–5.1)
Sodium: 136 mmol/L (ref 135–145)
Total Bilirubin: 0.6 mg/dL (ref 0.3–1.2)
Total Protein: 5.8 g/dL — ABNORMAL LOW (ref 6.5–8.1)

## 2019-12-30 LAB — LACTIC ACID, PLASMA: Lactic Acid, Venous: 3.9 mmol/L (ref 0.5–1.9)

## 2019-12-30 MED ORDER — SODIUM CHLORIDE 0.9 % IV BOLUS
250.0000 mL | Freq: Once | INTRAVENOUS | Status: AC
  Administered 2019-12-30: 250 mL via INTRAVENOUS

## 2019-12-30 MED ORDER — PIPERACILLIN-TAZOBACTAM 3.375 G IVPB 30 MIN: 3.3750 g | Freq: Once | INTRAVENOUS | Status: DC

## 2019-12-30 NOTE — ED Provider Notes (Signed)
[facility] EMERGENCY DEPARTMENT Provider Note   CSN: [id number] Arrival date & time: 12/30/19  1951     History Chief Complaint  Patient presents with  . Altered Mental Status    Shirley [name] is a 86 y.o. female.  Patient is a 86 year old female with a history of hypertension and polymyalgia rheumatica as well as dementia who presents with altered mental status.  She was recently admitted for Covid pneumonia as well as hip healthcare associated pneumonia.  She was admitted from December 28 to January 8.  She was treated with remdesivir, steroids and antibiotics.  She improved and was discharged to [facility] rehab facility.  Today she was discharged from the rehab back to her assisted living facility which [facility].  Per the staff there, she arrived and was hypotensive with decreased responsiveness and was clammy.  She had some hypotension with a blood pressure in the 80s.  She was sent here for further evaluation.  History is limited due to her dementia.  I spoke with her son but he does not really know of any changes because he has not been allowed to visit her since she has been hospitalized for Covid.        Past Medical History:  Diagnosis Date  . Arthritis    "right shoulder; that's why I had to have it replaced"  . Hypertension   . Osteoarthritis   . Osteoporosis   . PMR (polymyalgia rheumatica) [facility])     Patient Active Problem List   Diagnosis Date Noted  . Pneumonia 12/30/2019  . Dementia with behavioral disturbance (HCC)   . Pneumonia due to COVID-19 virus 12/03/2019  . Hypernatremia   . Healthcare-associated pneumonia 12/02/2019  . Palliative care by specialist   . Goals of care, counseling/discussion   . DNR (do not resuscitate)   . Acute encephalopathy 11/23/2019  . Ascending aortic aneurysm (HCC) 11/23/2019  . Moderate dementia (HCC) 08/10/2015  . Frequent falls 05/08/2015  . Anxiety 05/08/2015  . Anxiety state, unspecified  03/06/2014  . Short-term memory loss 12/30/2013  . Lumbar degenerative disc disease 08/22/2011  . Polymyalgia rheumatica (HCC) 05/16/2008  . Essential hypertension 03/12/2008  . Arthritis, senescent 03/12/2008  . MYALGIA 03/12/2008  . OSTEOPOROSIS 03/12/2008    Past Surgical History:  Procedure Laterality Date  . CATARACT EXTRACTION W/ INTRAOCULAR LENS  IMPLANT, BILATERAL     2008   . HIP PINNING,CANNULATED Right 10/20/2013   Procedure: CANNULATED HIP PINNING;  Surgeon: Eldred Manges, MD;  Location: [facility] ORS;  Service: Orthopedics;  Laterality: Right;  . KYPHOPLASTY N/A 02/28/2013   Procedure: KYPHOPLASTY;  Surgeon: Cristi Loron, MD;  Location: [facility] NEURO ORS;  Service: Neurosurgery;  Laterality: N/A;  Lumbar two Kyphoplasty  . POSTERIOR FUSION LUMBAR SPINE  03/28/12  . SHOULDER ARTHROSCOPY W/ ROTATOR CUFF REPAIR  ~ 01/2011   right  . TOTAL SHOULDER REPLACEMENT  06/2011   right  . VAGINAL HYSTERECTOMY  1970's     OB History    Gravida  3   Para      Term      Preterm      AB      Living        SAB      TAB      Ectopic      Multiple      Live Births              Family History  Problem Relation  Age of Onset  . ALS Father   . Hypertension Sister     Social History   Tobacco Use  . Smoking status: Former Smoker    Packs/day: 0.50    Years: 40.00    Pack years: 20.00    Types: Cigarettes    Quit date: 12/05/2013    Years since quitting: 6.0  . Smokeless tobacco: Never Used  Substance Use Topics  . Alcohol use: Never    Alcohol/week: 0.0 standard drinks    Comment: occ  . Drug use: No    Home Medications Prior to Admission medications   Medication Sig Start Date End Date Taking? Authorizing Provider  donepezil (ARICEPT) 10 MG tablet TAKE 1 TABLET EVERY DAY Patient taking differently: Take 10 mg by mouth at bedtime.  11/29/17  Yes Van Clines, MD  amLODipine (NORVASC) 5 MG tablet Take 1 tablet (5 mg total) by mouth daily. 12/12/19   Dorcas Carrow, MD  ascorbic acid (VITAMIN C) 500 MG tablet Take 500 mg by mouth 2 (two) times daily.    [provider]  Cholecalciferol (VITAMIN D-3) 25 MCG (1000 UT) CAPS Take 1,000 Units by mouth daily.    [provider]  citalopram (CELEXA) 10 MG tablet TAKE 2 TABLETS EVERY DAY Patient taking differently: Take 10 mg by mouth daily.  04/17/18   Roderick Pee, MD  metoprolol succinate (TOPROL-XL) 25 MG 24 hr tablet Take 25 mg by mouth at bedtime. 08/20/18   [provider]  Skin Protectants, Misc. (MINERIN CREME) CREA Apply 1 application topically every 12 (twelve) hours as needed (to affected area(s)- for rashes).     [provider]  triamcinolone cream (KENALOG) 0.1 % Compound 1;1 with Eucerin cream and apply bid prn rash. Patient taking differently: Apply 1 application topically every 12 (twelve) hours as needed (to affected area(s)- for rashes). Compound 1;1 with Eucerin cream and apply bid prn rash 04/25/18   Burchette, Elberta Fortis, MD  vitamin B-12 (CYANOCOBALAMIN) 1000 MCG tablet Take 2 tablets (2,000 mcg total) by mouth daily. Patient taking differently: Take 1,000 mcg by mouth daily.  11/28/19   Gwenyth Bender, NP  Zinc 100 MG TABS Take 100 mg by mouth daily.    [provider]    Allergies    Patient has no known allergies.  Review of Systems   Review of Systems  Unable to perform ROS: Dementia    Physical Exam Updated Vital Signs BP 109/72   Pulse 86   Temp (!) 96.6 F (35.9 C) (Rectal)   Resp 15   Ht 5\' 3"  (1.6 m)   Wt 59 kg   SpO2 94%   BMI 23.03 kg/m   Physical Exam Constitutional:      Appearance: She is well-developed.     Comments: Is drowsy but will wake up and look around.  She will tell me her name but will answer other questions  HENT:     Head: Normocephalic and atraumatic.  Eyes:     Pupils: Pupils are equal, round, and reactive to light.  Cardiovascular:     Rate and Rhythm: Normal rate. Rhythm irregular.      Heart sounds: Normal heart sounds.  Pulmonary:     Effort: Pulmonary effort is normal. No respiratory distress.     Breath sounds: Normal breath sounds. No wheezing or rales.  Chest:     Chest wall: No tenderness.  Abdominal:     General: Bowel sounds are normal.  Palpations: Abdomen is soft.     Tenderness: There is no abdominal tenderness. There is no guarding or rebound.  Musculoskeletal:        General: Normal range of motion.     Cervical back: Normal range of motion and neck supple.  Lymphadenopathy:     Cervical: No cervical adenopathy.  Skin:    General: Skin is warm and dry.     Findings: No rash.  Neurological:     Comments: Patient is oriented to person.  There is no focal deficit noted     ED Results / Procedures / Treatments   Labs (all labs ordered are listed, but only abnormal results are displayed) Labs Reviewed  COMPREHENSIVE METABOLIC PANEL - Abnormal; Notable for the following components:      Result Value   Glucose, Bld 182 (*)    Total Protein 5.8 (*)    Albumin 2.8 (*)    All other components within normal limits  CBC WITH DIFFERENTIAL/PLATELET - Abnormal; Notable for the following components:   Hemoglobin 15.3 (*)    HCT 47.4 (*)    RDW 15.6 (*)    All other components within normal limits  URINALYSIS, ROUTINE W REFLEX MICROSCOPIC - Abnormal; Notable for the following components:   Color, Urine AMBER (*)    APPearance CLOUDY (*)    Glucose, UA 50 (*)    Protein, ur 100 (*)    All other components within normal limits  LACTIC ACID, PLASMA - Abnormal; Notable for the following components:   Lactic Acid, Venous 3.9 (*)    All other components within normal limits  CULTURE, BLOOD (ROUTINE X 2)  CULTURE, BLOOD (ROUTINE X 2)  SARS CORONAVIRUS 2 (TAT 6-24 HRS)  LACTIC ACID, PLASMA    EKG EKG Interpretation  Date/Time:  Monday December 30 2019 20:08:17 EST Ventricular Rate:  70 PR Interval:    QRS Duration: 92 QT Interval:  340 QTC  Calculation: 367 R Axis:   29 Text Interpretation: Atrial fibrillation Ventricular premature complex Abnormal R-wave progression, early transition Borderline repolarization abnormality Confirmed by Malvin Johns [phone]) on 12/30/2019 8:25:07 PM   Radiology DG Chest Port 1 View  Result Date: 12/30/2019 CLINICAL DATA:  Shortness of breath and weakness. Recent COVID positive. EXAM: PORTABLE CHEST 1 VIEW COMPARISON:  12/07/2019 a prior radiographs FINDINGS: Mild cardiomegaly again noted. Bilateral LOWER lung airspace opacities are stable to slightly increased. No pleural effusion or pneumothorax. No acute bony abnormalities. RIGHT shoulder arthroplasty changes again identified. IMPRESSION: Stable to slightly increased bilateral LOWER lung airspace opacities/pneumonia. Electronically Signed   By: Margarette Canada M.D.   On: 12/30/2019 21:06    Procedures Procedures (including critical care time)  Medications Ordered in ED Medications  sodium chloride 0.9 % bolus 250 mL (has no administration in time range)  piperacillin-tazobactam (ZOSYN) IVPB 3.375 g (has no administration in time range)    ED Course  I have reviewed the triage vital signs and the nursing notes.  Pertinent labs & imaging results that were available during my care of the patient were reviewed by me and considered in my medical decision making (see chart for details).    MDM Rules/Calculators/A&P                      Patient is a 86 year old female who presents with altered mental status, and an episode of hypotension with clamminess at the nursing home.  Her chest x-ray shows some worsening infiltrates.  She has  an elevated lactate.  She so far does not have a fever.  Her white count is normal.  She is a new onset atrial fibrillation but her rate is controlled.  It is unclear how long this is been going on.  She has some tachypnea but no hypoxia.  No evidence of urinary tract infection.  I spoke to Dr. Loney Loh who will admit the  patient for further treatment.  She requested repeat Covid test and we will go ahead and start the patient on IV antibiotics given her prior history of healthcare associated pneumonia. Final Clinical Impression(s) / ED Diagnoses Final diagnoses:  Altered mental status, unspecified altered mental status type  COVID-19 virus infection    Rx / DC Orders ED Discharge Orders    None       Rolan Bucco, MD 12/30/19 2337

## 2019-12-30 NOTE — ED Triage Notes (Signed)
Pt arrived via [facility] from nursing home for [facility]. Pt is COVID + and was here in hosp for past 12 days and was just d/c'd today. Pt lethargic, opens eyes to verbal. Pt A-fib on monitor. VS WNL. Breathing normal.

## 2019-12-30 NOTE — ED Notes (Signed)
Patient son Tawanna Cooler calling asking for an update [phone] He is asking for a call back he has information on patient as well as if we have any questions for patient.

## 2019-12-31 ENCOUNTER — Other Ambulatory Visit: Payer: Self-pay

## 2019-12-31 ENCOUNTER — Inpatient Hospital Stay (HOSPITAL_COMMUNITY): Payer: Medicare PPO

## 2019-12-31 ENCOUNTER — Encounter (HOSPITAL_COMMUNITY): Payer: Self-pay | Admitting: Internal Medicine

## 2019-12-31 DIAGNOSIS — J189 Pneumonia, unspecified organism: Secondary | ICD-10-CM

## 2019-12-31 DIAGNOSIS — I4891 Unspecified atrial fibrillation: Secondary | ICD-10-CM

## 2019-12-31 DIAGNOSIS — I4819 Other persistent atrial fibrillation: Secondary | ICD-10-CM

## 2019-12-31 DIAGNOSIS — F0390 Unspecified dementia without behavioral disturbance: Secondary | ICD-10-CM

## 2019-12-31 DIAGNOSIS — R0989 Other specified symptoms and signs involving the circulatory and respiratory systems: Secondary | ICD-10-CM

## 2019-12-31 LAB — RESPIRATORY PANEL BY PCR

## 2019-12-31 LAB — ECHOCARDIOGRAM COMPLETE
Height: 63 in
Weight: 2080 oz

## 2019-12-31 LAB — TSH: TSH: 1.397 u[IU]/mL (ref 0.350–4.500)

## 2019-12-31 LAB — INFLUENZA PANEL BY PCR (TYPE A & B)
Influenza A By PCR: NEGATIVE
Influenza B By PCR: NEGATIVE

## 2019-12-31 LAB — HEPARIN LEVEL (UNFRACTIONATED): Heparin Unfractionated: 0.77 IU/mL — ABNORMAL HIGH (ref 0.30–0.70)

## 2019-12-31 LAB — FERRITIN: Ferritin: 177 ng/mL (ref 11–307)

## 2019-12-31 LAB — VITAMIN B12: Vitamin B-12: 4445 pg/mL — ABNORMAL HIGH (ref 180–914)

## 2019-12-31 LAB — D-DIMER, QUANTITATIVE: D-Dimer, Quant: 5.37 ug/mL-FEU — ABNORMAL HIGH (ref 0.00–0.50)

## 2019-12-31 LAB — LACTATE DEHYDROGENASE: LDH: 206 U/L — ABNORMAL HIGH (ref 98–192)

## 2019-12-31 LAB — LACTIC ACID, PLASMA: Lactic Acid, Venous: 2 mmol/L (ref 0.5–1.9)

## 2019-12-31 LAB — SARS CORONAVIRUS 2 (TAT 6-24 HRS): SARS Coronavirus 2: NEGATIVE

## 2019-12-31 LAB — AMMONIA: Ammonia: 31 umol/L (ref 9–35)

## 2019-12-31 LAB — C-REACTIVE PROTEIN: CRP: 0.6 mg/dL (ref <1.0)

## 2019-12-31 LAB — FIBRINOGEN: Fibrinogen: 544 mg/dL — ABNORMAL HIGH (ref 210–475)

## 2019-12-31 LAB — PROCALCITONIN: Procalcitonin: <0.1 ng/mL

## 2019-12-31 MED ORDER — IOHEXOL 350 MG/ML SOLN
100.0000 mL | Freq: Once | INTRAVENOUS | Status: AC | PRN
  Administered 2019-12-31: 100 mL via INTRAVENOUS

## 2019-12-31 MED ORDER — GUAIFENESIN-DM 100-10 MG/5ML PO SYRP: 10.0000 mL | ORAL_SOLUTION | ORAL | Status: DC | PRN

## 2019-12-31 MED ORDER — ZINC SULFATE 220 (50 ZN) MG PO CAPS
220.0000 mg | ORAL_CAPSULE | Freq: Every day | ORAL | Status: DC
  Administered 2019-12-31 – 2020-01-03 (×4): 220 mg via ORAL
  Filled 2019-12-31 (×4): qty 1

## 2019-12-31 MED ORDER — DEXAMETHASONE SODIUM PHOSPHATE 10 MG/ML IJ SOLN: 6.0000 mg | INTRAMUSCULAR | Status: DC

## 2019-12-31 MED ORDER — AMIODARONE HCL IN DEXTROSE 360-4.14 MG/200ML-% IV SOLN
60.0000 mg/h | INTRAVENOUS | Status: AC
  Administered 2019-12-31 (×2): 60 mg/h via INTRAVENOUS

## 2019-12-31 MED ORDER — HEPARIN BOLUS VIA INFUSION
2000.0000 [IU] | Freq: Once | INTRAVENOUS | Status: AC
  Administered 2019-12-31: 2000 [IU] via INTRAVENOUS
  Filled 2019-12-31: qty 2000

## 2019-12-31 MED ORDER — DEXAMETHASONE SODIUM PHOSPHATE 10 MG/ML IJ SOLN
6.0000 mg | Freq: Once | INTRAMUSCULAR | Status: AC
  Administered 2019-12-31: 6 mg via INTRAVENOUS
  Filled 2019-12-31: qty 1

## 2019-12-31 MED ORDER — ALBUTEROL SULFATE HFA 108 (90 BASE) MCG/ACT IN AERS: 2.0000 | INHALATION_SPRAY | RESPIRATORY_TRACT | Status: DC | PRN

## 2019-12-31 MED ORDER — HEPARIN (PORCINE) 25000 UT/250ML-% IV SOLN: 900.0000 [IU]/h | INTRAVENOUS | Status: DC

## 2019-12-31 MED ORDER — ACETAMINOPHEN 325 MG PO TABS: 650.0000 mg | ORAL_TABLET | Freq: Four times a day (QID) | ORAL | Status: DC | PRN

## 2019-12-31 MED ORDER — HEPARIN BOLUS VIA INFUSION
2000.0000 [IU] | Freq: Once | INTRAVENOUS | Status: DC
  Filled 2019-12-31: qty 2000

## 2019-12-31 MED ORDER — ASCORBIC ACID 500 MG PO TABS
500.0000 mg | ORAL_TABLET | Freq: Every day | ORAL | Status: DC
  Administered 2019-12-31 – 2020-01-03 (×4): 500 mg via ORAL
  Filled 2019-12-31 (×4): qty 1

## 2019-12-31 MED ORDER — HYDROCOD POLST-CPM POLST ER 10-8 MG/5ML PO SUER: 5.0000 mL | Freq: Two times a day (BID) | ORAL | Status: DC | PRN

## 2019-12-31 MED ORDER — VANCOMYCIN HCL 750 MG/150ML IV SOLN
750.0000 mg | INTRAVENOUS | Status: DC
  Filled 2019-12-31: qty 150

## 2019-12-31 MED ORDER — VANCOMYCIN HCL 1250 MG/250ML IV SOLN
1250.0000 mg | Freq: Once | INTRAVENOUS | Status: AC
  Administered 2019-12-31: 1250 mg via INTRAVENOUS
  Filled 2019-12-31: qty 250

## 2019-12-31 MED ORDER — AMIODARONE HCL IN DEXTROSE 360-4.14 MG/200ML-% IV SOLN
30.0000 mg/h | INTRAVENOUS | Status: DC
  Administered 2019-12-31 – 2020-01-01 (×3): 30 mg/h via INTRAVENOUS
  Filled 2019-12-31 (×5): qty 200

## 2019-12-31 MED ORDER — METOPROLOL TARTRATE 5 MG/5ML IV SOLN
2.5000 mg | INTRAVENOUS | Status: DC | PRN
  Administered 2019-12-31: 2.5 mg via INTRAVENOUS
  Filled 2019-12-31: qty 5

## 2019-12-31 MED ORDER — SODIUM CHLORIDE 0.9 % IV SOLN
2.0000 g | Freq: Once | INTRAVENOUS | Status: AC
  Administered 2019-12-31: 2 g via INTRAVENOUS
  Filled 2019-12-31: qty 2

## 2019-12-31 MED ORDER — SODIUM CHLORIDE 0.9 % IV BOLUS
250.0000 mL | Freq: Once | INTRAVENOUS | Status: AC
  Administered 2019-12-31: 250 mL via INTRAVENOUS

## 2019-12-31 MED ORDER — SODIUM CHLORIDE 0.9 % IV SOLN
2.0000 g | Freq: Two times a day (BID) | INTRAVENOUS | Status: DC
  Administered 2019-12-31 – 2020-01-01 (×3): 2 g via INTRAVENOUS
  Filled 2019-12-31 (×4): qty 2

## 2019-12-31 MED ORDER — HEPARIN (PORCINE) 25000 UT/250ML-% IV SOLN
900.0000 [IU]/h | INTRAVENOUS | Status: DC
  Administered 2019-12-31: 900 [IU]/h via INTRAVENOUS
  Administered 2020-01-01: 800 [IU]/h via INTRAVENOUS
  Filled 2019-12-31 (×3): qty 250

## 2019-12-31 MED ORDER — AMIODARONE LOAD VIA INFUSION
150.0000 mg | Freq: Once | INTRAVENOUS | Status: AC
  Administered 2019-12-31: 150 mg via INTRAVENOUS
  Filled 2019-12-31: qty 83.34

## 2019-12-31 NOTE — Consult Note (Signed)
Cardiology Consultation:   Patient ID: Shirley [name]; [id number]; [birth year]   Admit date: 12/30/2019 Date of Consult: 12/31/2019  Primary Care Provider: Dorena Cookey, MD (Inactive) Primary Cardiologist: New to [facility]   Patient Profile:   Shirley [name] is a 86 y.o. female with a hx of HTN, dementia, recent COVID 22 infection/PNA who is being seen today for the evaluation of atrial fibrillation with RVR at the request of Dr. Marlowe Sax.  History of Present Illness:   Shirley [name] is an 86yo F wih a hx as stated above who presented to [facility] on 12/31/19 in the setting of AMS and hypotension found to be in new onset atrial fibrillation. Patient has baseline dementia, therefore cannot recall the events that led to her re-hospitalization. HPI obtained from chart review.   Pt has a complicated recent hx with prior hospitalization from 12/02/19-12/14/19 for COVID-19/PNA and HCAP. She was treated with remdesivir, steroids and antibiotics and was ultimately discharged to [facility] rehabilitation facility. It appears that that patient was discharged from the above back to her assisted living facility, [facility] one day prior to this hospital admission. Per chart review, staff reported that on her arrival back to ALF, the patient was more altered than baseline and was found to be hypotensive with SBPs in the 80's. Given this, she was sent to the ED for further evaluation. ED provider spoke to the patients son who could not assist with much information as he has not been able to visit over the last several weeks while in the rehabilitation facility.   In the ED, EKG and telemetry showed atrial fibrillation with HR 70 however telemetry review shows evidence of RVR with rates in the 120-140 range. CXR with increased bilateral lower lung airspace opacities when compared to prior imaging. There was no evidence of leukocytosis, UA not suggestive of infection. COVID 19 testing negative. D-dimer found to be  elevated at 5.37 with fibrinogen at 544. Would recommend CTA given high risk for PE with recent COVID. She received IV fluid bolus and was started on IV antibiotics for HCAP PNA. Heparin infusion was started for [facility]. Echocardiogram with pending results. Amiodarone was ultimately initiated with 150mg  loading dose. HR's continue to be in the low 100 range. She denies chest pain or SOB. Reports breathing has improved. She states that she has felt palpitations however subjective symptoms continue to be difficult to determine given dementia/altered mental status.   Past Medical History:  Diagnosis Date  . Arthritis    "right shoulder; that's why I had to have it replaced"  . Hypertension   . Osteoarthritis   . Osteoporosis   . PMR (polymyalgia rheumatica) (HCC)     Past Surgical History:  Procedure Laterality Date  . CATARACT EXTRACTION W/ INTRAOCULAR LENS  IMPLANT, BILATERAL     2008   . HIP PINNING,CANNULATED Right 10/20/2013   Procedure: CANNULATED HIP PINNING;  Surgeon: Marybelle Killings, MD;  Location: [facility];  Service: Orthopedics;  Laterality: Right;  . KYPHOPLASTY N/A 02/28/2013   Procedure: KYPHOPLASTY;  Surgeon: Ophelia Charter, MD;  Location: [place] [facility];  Service: Neurosurgery;  Laterality: N/A;  Lumbar two Kyphoplasty  . POSTERIOR FUSION LUMBAR SPINE  03/28/12  . SHOULDER ARTHROSCOPY W/ ROTATOR CUFF REPAIR  ~ 01/2011   right  . TOTAL SHOULDER REPLACEMENT  06/2011   right  . VAGINAL HYSTERECTOMY  1970's     Prior to Admission medications   Medication Sig Start Date End Date Taking?  Authorizing Provider  amLODipine (NORVASC) 5 MG tablet Take 1 tablet (5 mg total) by mouth daily. 12/12/19  Yes Dorcas CarrowGhimire, Kuber, MD  ascorbic acid (VITAMIN C) 500 MG tablet Take 500 mg by mouth 2 (two) times daily.   Yes [provider]  Cholecalciferol (VITAMIN D-3) 25 MCG (1000 UT) CAPS Take 1,000 Units by mouth daily.   Yes [provider]  citalopram (CELEXA) 10 MG tablet TAKE 2 TABLETS  EVERY DAY Patient taking differently: Take 10 mg by mouth daily.  04/17/18  Yes Roderick Peeodd, Jeffrey A, MD  donepezil (ARICEPT) 10 MG tablet TAKE 1 TABLET EVERY DAY Patient taking differently: Take 10 mg by mouth daily.  11/29/17  Yes Van ClinesAquino, Karen M, MD  metoprolol succinate (TOPROL-XL) 25 MG 24 hr tablet Take 25 mg by mouth at bedtime. 08/20/18  Yes [provider]  Skin Protectants, Misc. (MINERIN CREME) CREA Apply 1 application topically every 12 (twelve) hours as needed (to affected area(s)- for rashes).    Yes [provider]  triamcinolone cream (KENALOG) 0.1 % Compound 1;1 with Eucerin cream and apply bid prn rash. Patient taking differently: Apply 1 application topically every 12 (twelve) hours as needed (to affected area(s)- for rashes). Compound 1;1 with Eucerin cream and apply bid prn rash 04/25/18  Yes Burchette, Elberta FortisBruce W, MD  vitamin B-12 (CYANOCOBALAMIN) 1000 MCG tablet Take 2 tablets (2,000 mcg total) by mouth daily. Patient taking differently: Take 1,000 mcg by mouth daily.  11/28/19  Yes Black, Lesle ChrisKaren M, NP  Zinc 100 MG TABS Take 100 mg by mouth daily.   Yes [provider]    Inpatient Medications: Scheduled Meds: . amiodarone  150 mg Intravenous Once  . vitamin C  500 mg Oral Daily  . dexamethasone (DECADRON) injection  6 mg Intravenous Q24H  . zinc sulfate  220 mg Oral Daily   Continuous Infusions: . amiodarone     Followed by  . amiodarone    . ceFEPime (MAXIPIME) IV    . heparin 900 Units/hr (12/31/19 0546)  . vancomycin     PRN Meds: acetaminophen, albuterol, chlorpheniramine-HYDROcodone, guaiFENesin-dextromethorphan, metoprolol tartrate  Allergies:   No Known Allergies  Social History:   Social History   Socioeconomic History  . Marital status: Married    Spouse name: Not on file  . Number of children: 3  . Years of education: Not on file  . Highest education level: Not on file  Occupational History  . Occupation: retired  Tobacco  Use  . Smoking status: Former Smoker    Packs/day: 0.50    Years: 40.00    Pack years: 20.00    Types: Cigarettes    Quit date: 12/05/2013    Years since quitting: 6.0  . Smokeless tobacco: Never Used  Substance and Sexual Activity  . Alcohol use: Never    Alcohol/week: 0.0 standard drinks    Comment: occ  . Drug use: No  . Sexual activity: Never  Other Topics Concern  . Not on file  Social History Narrative   Retired   Married   Alcohol use-no   Drug use-no   Regular Exercise-yes   Current Smoker         Social Determinants of Corporate investment bankerHealth   Financial Resource Strain:   . Difficulty of Paying Living Expenses: Not on file  Food Insecurity:   . Worried About Programme researcher, broadcasting/film/videounning Out of Food in the Last Year: Not on file  . Ran Out of Food in the Last Year: Not on  file  Transportation Needs:   . Freight forwarder (Medical): Not on file  . Lack of Transportation (Non-Medical): Not on file  Physical Activity:   . Days of Exercise per Week: Not on file  . Minutes of Exercise per Session: Not on file  Stress:   . Feeling of Stress : Not on file  Social Connections:   . Frequency of Communication with Friends and Family: Not on file  . Frequency of Social Gatherings with Friends and Family: Not on file  . Attends Religious Services: Not on file  . Active Member of Clubs or Organizations: Not on file  . Attends Banker Meetings: Not on file  . Marital Status: Not on file  Intimate Partner Violence:   . Fear of Current or Ex-Partner: Not on file  . Emotionally Abused: Not on file  . Physically Abused: Not on file  . Sexually Abused: Not on file    Family History:   Family History  Problem Relation Age of Onset  . ALS Father   . Hypertension Sister    Family Status:  Family Status  Relation Name Status  . Father  Deceased  . Mother  Deceased  . Sister  Deceased  . Sister  (Not Specified)    ROS:  Please see the history of present illness.  All other ROS  reviewed and negative.     Physical Exam/Data:   Vitals:   12/31/19 0700 12/31/19 0715 12/31/19 0730 12/31/19 0745  BP: (!) 101/59 128/71 122/75 109/76  Pulse: 82 74 86 65  Resp: 14 (!) 39 (!) 30   Temp:      TempSrc:      SpO2: 98% 99% 99% 98%  Weight:      Height:       No intake or output data in the 24 hours ending 12/31/19 0933 Filed Weights   12/30/19 1957  Weight: 59 kg   Body mass index is 23.03 kg/m.   General: Elderly,NAD Skin: Warm, dry, intact  Neck: Negative for carotid bruits. No JVD Lungs: Diminished in bilateral bases. No wheezes, rales, or rhonchi. Breathing is unlabored. Cardiovascular: Irregularly irregular with S1 S2. No murmurs Abdomen: Soft, non-tender, non-distended. No obvious abdominal masses. Extremities: No edema. DP pulses 2+ bilaterally Neuro: Alert and disoriented. No facial asymmetry. MAE spontaneously. Psych: Does not respond to questions appropriately with normal affect.     EKG:  The EKG was personally reviewed and demonstrates: 12/30/19 AF with HR 70bpm and no acute changes  Telemetry:  Telemetry was personally reviewed and demonstrates: 12/31/19 AF with rates in the low 100's   Relevant CV Studies:  Echocardiogram 12/31/19: Pending   Laboratory Data:  Chemistry Recent Labs  Lab 12/30/19 2031  NA 136  K 3.7  CL 99  CO2 22  GLUCOSE 182*  BUN 13  CREATININE 0.80  CALCIUM 9.0  GFRNONAA >60  GFRAA >60  ANIONGAP 15    Total Protein  Date Value Ref Range Status  12/30/2019 5.8 (L) 6.5 - 8.1 g/dL Final   Albumin  Date Value Ref Range Status  12/30/2019 2.8 (L) 3.5 - 5.0 g/dL Final   AST  Date Value Ref Range Status  12/30/2019 24 15 - 41 U/L Final   ALT  Date Value Ref Range Status  12/30/2019 20 0 - 44 U/L Final   Alkaline Phosphatase  Date Value Ref Range Status  12/30/2019 82 38 - 126 U/L Final   Total Bilirubin  Date Value  Ref Range Status  12/30/2019 0.6 0.3 - 1.2 mg/dL Final   Hematology Recent Labs    Lab 12/30/19 2031  WBC 7.1  RBC 4.98  HGB 15.3*  HCT 47.4*  MCV 95.2  MCH 30.7  MCHC 32.3  RDW 15.6*  PLT 211   Cardiac EnzymesNo results for input(s): TROPONINI in the last 168 hours. No results for input(s): TROPIPOC in the last 168 hours.  BNPNo results for input(s): BNP, PROBNP in the last 168 hours.  DDimer  Recent Labs  Lab 12/31/19 0212  DDIMER 5.37*   TSH:  Lab Results  Component Value Date   TSH 1.397 12/31/2019   Lipids: Lab Results  Component Value Date   CHOL 152 08/23/2016   HDL 33.60 (L) 08/23/2016   LDLCALC 88 08/23/2016   TRIG 148.0 08/23/2016   CHOLHDL 5 08/23/2016   HgbA1c: Lab Results  Component Value Date   HGBA1C 6.7 (H) 12/08/2019    Radiology/Studies:  CT HEAD WO CONTRAST  Result Date: 12/31/2019 CLINICAL DATA:  Encephalopathy, history of hypertension EXAM: CT HEAD WITHOUT CONTRAST TECHNIQUE: Contiguous axial images were obtained from the base of the skull through the vertex without intravenous contrast. COMPARISON:  Perfusion study 11/23/2019, 11/24/2019 FINDINGS: Brain: Diffuse atrophy and ventricular dilatation is stable from prior comparisons. Notable atrophy of the mesial temporal lobes, also unchanged from prior. 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 No convincing evidence of acute infarction, hemorrhage, hydrocephalus, extra-axial collection or mass lesion/mass effect. Patchy areas of white matter hypoattenuation are most compatible with advanced chronic microvascular angiopathy. Vascular: Atherosclerotic calcification of the carotid siphons and intradural vertebral arteries. No hyperdense vessel. Skull: No calvarial fracture or suspicious osseous lesion. No scalp swelling or hematoma. Sinuses/Orbits: Pneumatized secretions with air-fluid level in the left maxillary sinus. Background of mild bilateral maxillary mucosal thickening as well. Remaining paranasal sinuses and mastoids are predominantly clear. Orbital  structures are unremarkable aside from prior lens extractions. Other: None IMPRESSION: 1. No acute intracranial abnormality. 2. Stable chronic changes including sequela of a remote cortically based left frontal lobe infarct, diffuse parenchymal atrophy including atrophy of the mesial temporal lobes and advanced chronic microvascular angiopathy. 3. Pneumatized secretions with air-fluid level in the left maxillary sinus. Correlate for signs of acute sinusitis. Electronically Signed   By: Kreg Shropshire M.D.   On: 12/31/2019 04:58   DG Chest Port 1 View  Result Date: 12/30/2019 CLINICAL DATA:  Shortness of breath and weakness. Recent COVID positive. EXAM: PORTABLE CHEST 1 VIEW COMPARISON:  12/07/2019 a prior radiographs FINDINGS: Mild cardiomegaly again noted. Bilateral LOWER lung airspace opacities are stable to slightly increased. No pleural effusion or pneumothorax. No acute bony abnormalities. RIGHT shoulder arthroplasty changes again identified. IMPRESSION: Stable to slightly increased bilateral LOWER lung airspace opacities/pneumonia. Electronically Signed   By: Harmon Pier M.D.   On: 12/30/2019 21:06   Assessment and Plan:   1. New onset atrial fibrillation with RVR:  -Was noted to have episodes of SVT during previous hospitalization for COVID 19 infection treated with metoprolol. Patient was then re-admitted to [facility] on 12/31/19 with AMS and hypotension found to be in atrial fibrillation. EKG from that time shows a HR of 70bpm however it appears that her rates were elevated in the 120-140 range. She was treated for hypotension with IVF bolus and IV Amiodarone was started given low BPs. Heparin infusion was initiated for [facility]. Head CT negative for acute abnormalities.  -She has no prior hx of atrial  fibrillation, likely being precipitated by acute illness -BPs more stable at 135/82>109/76>122/75 with HRs in the 60-80 range -Would continue amiodarone for now however not ideal given current PNA  -Will start  metoprolol tartrate and plan to transition to succinate prior to d/c  -May need to transition amlodipine to diltiazem PO at d/c if HR remains uncontrolled.  -Echo pending to assess LV function -Does not appear to be symptomatic with AF, therefore will plan for transition to oral AC once more stable and possible DCCV in 3-4 weeks  -CHA2DS2VASc =4 (age, female, HTN)  2. Recent COVID-19 PNA/HCAP infection: -Pt recently hospitalized and treated for Covid19/PNA>>>discharged to [facility] facility. Unfortunately, she was released from there back to her assisted living facility on the day of hospital presentation secondary to AMS and hypotension.  -Previously treated with remdesivir, steroids, and antibiotics. She has been afebrile with no leukocytosis. -Lactic acid was elevated at 3.9.  -CXR with increased bilateral lower lung airspace opacities.  -D-dimer elevated at 5.37 (previosuly 2.04 at hospital discharge) with elevated fibrinogen at 544.  -Would obtain CTA for further investigation to rule out PE given high risk as above -Plan for IV Abx per primary team  -COVID re-test this admission, negative   3. Dementia/acute metabolic encephalopathy: -Pt presented with AMS however has underlying dementia currently treated with  -Head CT on presentation negative for acute abnormality -Treated with home Aricept   4. HTN: -Stable, 135/82>109/76>122/75 -On home amlodipine, Toprol 25>>>currently on hold given hypotension on presentation    For questions or updates, please contact [facility] Please consult [email or web address] for contact info under Cardiology/STEMI.   SignedGeorgie Chard NP-C [facility] Pager: [phone] 12/31/2019 9:33 AM

## 2019-12-31 NOTE — H&P (Addendum)
History and Physical    [name] FHL:[id number] DOB: 08/23/1934 DOA: 12/30/2019  PCP: Roderick Pee, MD (Inactive) Patient coming from: Home  Chief Complaint: Altered mental status  HPI: Shirley [name] is a 86 y.o. female with medical history significant of hypertension, polymyalgia rheumatica, dementia, SVT presenting to the ED for evaluation of altered mental status. Patient was recently admitted to the hospital from 12/28-1/9 for COVID pneumonia and HCAP.  She was treated with remdesivir, steroids, and antibiotics.  She was discharged to [facility] rehab facility.  Today she was discharged from the rehab facility back to [facility] which is her assisted living facility.  Per staff there, patient arrived hypotensive with decreased responsiveness and was clammy.  Blood pressure in the 80s.  She was sent to the ED for further evaluation.  ED provider spoke to the patient's son who could not give any collateral information as he has not been allowed to visit the patient since she was hospitalized for Covid.  Patient is confused and not able to give any history.  ED Course: Afebrile.  Blood pressure intermittently soft, remainder of vital signs stable.  Found to be in new onset A. fib, rate controlled.  No leukocytosis.  Lactic acid 3.9.  UA not suggestive of infection.  Blood culture x2 pending.  SARS-CoV-2 PCR test pending.  Chest x-ray personally reviewed showing slightly increased bilateral lower lung airspace opacities. Patient received a 250 cc normal saline bolus.  Review of Systems:  All systems reviewed and apart from history of presenting illness, are negative.  Past Medical History:  Diagnosis Date  . Arthritis    "right shoulder; that's why I had to have it replaced"  . Hypertension   . Osteoarthritis   . Osteoporosis   . PMR (polymyalgia rheumatica) (HCC)     Past Surgical History:  Procedure Laterality Date  . CATARACT EXTRACTION W/ INTRAOCULAR LENS  IMPLANT,  BILATERAL     2008   . HIP PINNING,CANNULATED Right 10/20/2013   Procedure: CANNULATED HIP PINNING;  Surgeon: Eldred Manges, MD;  Location: [facility] ORS;  Service: Orthopedics;  Laterality: Right;  . KYPHOPLASTY N/A 02/28/2013   Procedure: KYPHOPLASTY;  Surgeon: Cristi Loron, MD;  Location: [facility];  Service: Neurosurgery;  Laterality: N/A;  Lumbar two Kyphoplasty  . POSTERIOR FUSION LUMBAR SPINE  03/28/12  . SHOULDER ARTHROSCOPY W/ ROTATOR CUFF REPAIR  ~ 01/2011   right  . TOTAL SHOULDER REPLACEMENT  06/2011   right  . VAGINAL HYSTERECTOMY  1970's     reports that she quit smoking about 6 years ago. Her smoking use included cigarettes. She has a 20.00 pack-year smoking history. She has never used smokeless tobacco. She reports that she does not drink alcohol or use drugs.  No Known Allergies  Family History  Problem Relation Age of Onset  . ALS Father   . Hypertension Sister     Prior to Admission medications   Medication Sig Start Date End Date Taking? Authorizing Provider  donepezil (ARICEPT) 10 MG tablet TAKE 1 TABLET EVERY DAY Patient taking differently: Take 10 mg by mouth at bedtime.  11/29/17  Yes Van Clines, MD  amLODipine (NORVASC) 5 MG tablet Take 1 tablet (5 mg total) by mouth daily. 12/12/19   Dorcas Carrow, MD  ascorbic acid (VITAMIN C) 500 MG tablet Take 500 mg by mouth 2 (two) times daily.    [provider]  Cholecalciferol (VITAMIN D-3) 25 MCG (1000 UT) CAPS Take 1,000  Units by mouth daily.    [provider]  citalopram (CELEXA) 10 MG tablet TAKE 2 TABLETS EVERY DAY Patient taking differently: Take 10 mg by mouth daily.  04/17/18   Dorena Cookey, MD  metoprolol succinate (TOPROL-XL) 25 MG 24 hr tablet Take 25 mg by mouth at bedtime. 08/20/18   [provider]  Skin Protectants, Misc. (MINERIN CREME) CREA Apply 1 application topically every 12 (twelve) hours as needed (to affected area(s)- for rashes).     [provider]    triamcinolone cream (KENALOG) 0.1 % Compound 1;1 with Eucerin cream and apply bid prn rash. Patient taking differently: Apply 1 application topically every 12 (twelve) hours as needed (to affected area(s)- for rashes). Compound 1;1 with Eucerin cream and apply bid prn rash 04/25/18   Burchette, Alinda Sierras, MD  vitamin B-12 (CYANOCOBALAMIN) 1000 MCG tablet Take 2 tablets (2,000 mcg total) by mouth daily. Patient taking differently: Take 1,000 mcg by mouth daily.  11/28/19   Radene Gunning, NP  Zinc 100 MG TABS Take 100 mg by mouth daily.    [provider]    Physical Exam: Vitals:   12/30/19 2222 12/30/19 2230 12/30/19 2245 12/30/19 2315  BP: 120/85 100/76 113/72 109/72  Pulse: (!) 35   86  Resp: (!) 21 14 18 15   Temp:      TempSrc:      SpO2: 93%   94%  Weight:      Height:        Physical Exam  Constitutional: She appears well-developed and well-nourished. No distress.  HENT:  Head: Normocephalic.  Eyes: Right eye exhibits no discharge. Left eye exhibits no discharge.  Cardiovascular: Normal rate and intact distal pulses.  Irregularly irregular rhythm  Pulmonary/Chest: Breath sounds normal. No respiratory distress. She has no wheezes.  Mild bibasilar rales Slightly tachypneic  Abdominal: Soft. Bowel sounds are normal. She exhibits no distension. There is no abdominal tenderness. There is no guarding.  Musculoskeletal:        General: No edema.     Cervical back: Neck supple.  Neurological:  Somnolent but waking up intermittently No slurring of speech or facial droop Oriented to self only Not following commands  Skin: Skin is warm and dry. She is not diaphoretic.  Decreased skin turgor     Labs on Admission: I have personally reviewed following labs and imaging studies  CBC: Recent Labs  Lab 12/30/19 2031  WBC 7.1  NEUTROABS 5.0  HGB 15.3*  HCT 47.4*  MCV 95.2  PLT 229   Basic Metabolic Panel: Recent Labs  Lab 12/30/19 2031  NA 136  K 3.7  CL 99   CO2 22  GLUCOSE 182*  BUN 13  CREATININE 0.80  CALCIUM 9.0   GFR: Estimated Creatinine Clearance: 44.1 mL/min (by C-G formula based on SCr of 0.8 mg/dL). Liver Function Tests: Recent Labs  Lab 12/30/19 2031  AST 24  ALT 20  ALKPHOS 82  BILITOT 0.6  PROT 5.8*  ALBUMIN 2.8*   No results for input(s): LIPASE, AMYLASE in the last 168 hours. No results for input(s): AMMONIA in the last 168 hours. Coagulation Profile: No results for input(s): INR, PROTIME in the last 168 hours. Cardiac Enzymes: No results for input(s): CKTOTAL, CKMB, CKMBINDEX, TROPONINI in the last 168 hours. BNP (last 3 results) No results for input(s): PROBNP in the last 8760 hours. HbA1C: No results for input(s): HGBA1C in the last 72 hours. CBG: No results for input(s): GLUCAP  in the last 168 hours. Lipid Profile: No results for input(s): CHOL, HDL, LDLCALC, TRIG, CHOLHDL, LDLDIRECT in the last 72 hours. Thyroid Function Tests: No results for input(s): TSH, T4TOTAL, FREET4, T3FREE, THYROIDAB in the last 72 hours. Anemia Panel: No results for input(s): VITAMINB12, FOLATE, FERRITIN, TIBC, IRON, RETICCTPCT in the last 72 hours. Urine analysis:    Component Value Date/Time   COLORURINE AMBER (A) 12/30/2019 2220   APPEARANCEUR CLOUDY (A) 12/30/2019 2220   LABSPEC 1.020 12/30/2019 2220   PHURINE 5.0 12/30/2019 2220   GLUCOSEU 50 (A) 12/30/2019 2220   HGBUR NEGATIVE 12/30/2019 2220   HGBUR trace-intact 05/24/2010 0956   BILIRUBINUR NEGATIVE 12/30/2019 2220   BILIRUBINUR n 08/23/2016 1246   KETONESUR NEGATIVE 12/30/2019 2220   PROTEINUR 100 (A) 12/30/2019 2220   UROBILINOGEN 0.2 08/23/2016 1246   UROBILINOGEN 1.0 07/04/2015 1925   NITRITE NEGATIVE 12/30/2019 2220   LEUKOCYTESUR NEGATIVE 12/30/2019 2220    Radiological Exams on Admission: DG Chest Port 1 View  Result Date: 12/30/2019 CLINICAL DATA:  Shortness of breath and weakness. Recent COVID positive. EXAM: PORTABLE CHEST 1 VIEW COMPARISON:   12/07/2019 a prior radiographs FINDINGS: Mild cardiomegaly again noted. Bilateral LOWER lung airspace opacities are stable to slightly increased. No pleural effusion or pneumothorax. No acute bony abnormalities. RIGHT shoulder arthroplasty changes again identified. IMPRESSION: Stable to slightly increased bilateral LOWER lung airspace opacities/pneumonia. Electronically Signed   By: Harmon Pier M.D.   On: 12/30/2019 21:06    EKG: Independently reviewed.  Atrial fibrillation, heart rate 70.  Assessment/Plan Principal Problem:   HCAP (healthcare-associated pneumonia) Active Problems:   Acute encephalopathy   New onset atrial fibrillation (HCC)   HCAP Patient was recently admitted to the hospital from 12/28-1/9 for COVID pneumonia and HCAP.  She was treated with remdesivir, steroids, and antibiotics.  Sent to the hospital from her assisted living facility for evaluation of lethargy/decreased responsiveness and hypotension.  Blood pressure soft in the ED, improved with small fluid bolus.  Afebrile and no leukocytosis.  Not tachycardic.  Slightly tachypneic but not hypoxic, oxygen saturation in the mid 90s on room air.  Lactic acid elevated at 3.9, appears dehydrated. Chest x-ray personally reviewed showing slightly increased bilateral lower lung airspace opacities. -Give additional fluid and trend lactate -Vancomycin and cefepime.  Check procalcitonin level. -IV Decadron 6 mg daily -Already treated with a 5-day course of remdesivir during recent hospitalization -Blood culture x2 pending -SARS-CoV-2 PCR test pending -Influenza and respiratory viral panel -Check inflammatory markers including ferritin, fibrinogen, D-dimer, CRP, LDH -Airborne and contact precautions -Continuous pulse ox, supplemental oxygen as needed to keep oxygen saturation above 92%  New onset A. Fib Suspect precipitated by acute illness/pneumonia. -Currently rate controlled.  May need BB/CCB if rate  uncontrolled. -CHA2DS2VASc 4.  Start heparin for anticoagulation if head CT negative for bleed.  Consult cardiology in a.m. for further recommendations. -Echocardiogram -Check TSH level  Acute metabolic encephalopathy In setting of underlying dementia and acute medical illness/pneumonia.  Patient is currently somnolent and oriented to self only.  Not following commands. -Stat head CT without contrast -Check TSH, B12, ammonia levels -Keep n.p.o., aspiration precautions  Pharmacy med rec pending.  DVT prophylaxis: Heparin if head CT negative for acute bleed Code Status: DNR/DNI per records from recent hospitalization. Family Communication: No family available at this time. Disposition Plan: Anticipate discharge after clinical improvement. Consults called: None Admission status: It is my clinical opinion that admission to INPATIENT is reasonable and necessary in this 86 y.o. female .  presenting with HCAP, new onset A. fib, and acute metabolic encephalopathy.  PSI/PORT score 133 points, class V risk with 27-29.2% risk of mortality.   Given the aforementioned, the predictability of an adverse outcome is felt to be significant. I expect that the patient will require at least 2 midnights in the hospital to treat this condition.   The medical decision making on this patient was of high complexity and the patient is at high risk for clinical deterioration, therefore this is a level 3 visit.  John Giovanni MD [facility]  If 7PM-7AM, please contact night-coverage [email or web address] Password [facility]  12/31/2019, 12:58 AM

## 2019-12-31 NOTE — Progress Notes (Signed)
  Echocardiogram 2D Echocardiogram has been performed.  Shirley [name] 12/31/2019, 9:09 AM

## 2019-12-31 NOTE — Progress Notes (Signed)
Patient's legal guardian, Liahna Brickner, notified of admission to 6E and unit phone number given.  Updated on patient's plan of care.  He states sister, Carleah Yablonski will be designated visitor.

## 2019-12-31 NOTE — Consult Note (Signed)
ASSESSMENT & PLAN   COLD FEET: The patient has reasonable doppler signals in the DP and PT position bilaterally. No further workup indicated. She has Afib with RVR and is on heparin.   REASON FOR CONSULT:    No doppler flow in both feet. The consult is requested by  Dr. Randol Kern.   HPI:   Shirley [name] is a 86 y.o. female who was admitted with new onset Afib and RVR. She has significant dementia and I can not get an accurate history from the patient. However, she denies any leg pain or paresthesias. She denies any h/o claudication, rest pain, or non-healing ulcers.   When transferred to the floor, the nurse could not feel pulses or obtain doppler signals in either foot. For this reason vascular surgery was consulted.   Past Medical History:  Diagnosis Date  . Arthritis    "right shoulder; that's why I had to have it replaced"  . Hypertension   . Osteoarthritis   . Osteoporosis   . PMR (polymyalgia rheumatica) (HCC)     Family History  Problem Relation Age of Onset  . ALS Father   . Hypertension Sister     SOCIAL HISTORY: Social History   Tobacco Use  . Smoking status: Former Smoker    Packs/day: 0.50    Years: 40.00    Pack years: 20.00    Types: Cigarettes    Quit date: 12/05/2013    Years since quitting: 6.0  . Smokeless tobacco: Never Used  Substance Use Topics  . Alcohol use: Never    Alcohol/week: 0.0 standard drinks    Comment: occ    No Known Allergies  Current Facility-Administered Medications  Medication Dose Route Frequency Provider Last Rate Last Admin  . acetaminophen (TYLENOL) tablet 650 mg  650 mg Oral Q6H PRN John Giovanni, MD      . albuterol (VENTOLIN HFA) 108 (90 Base) MCG/ACT inhaler 2 puff  2 puff Inhalation Q4H PRN John Giovanni, MD      . amiodarone (NEXTERONE PREMIX) 360-4.14 MG/200ML-% (1.8 mg/mL) IV infusion  30 mg/hr Intravenous Continuous Elgergawy, Leana Roe, MD 16.67 mL/hr at 12/31/19 1618 30 mg/hr at 12/31/19 1618  .  ascorbic acid (VITAMIN C) tablet 500 mg  500 mg Oral Daily John Giovanni, MD   500 mg at 12/31/19 1126  . ceFEPIme (MAXIPIME) 2 g in sodium chloride 0.9 % 100 mL IVPB  2 g Intravenous Q12H Juliette Mangle, RPH   Stopped at 12/31/19 1212  . chlorpheniramine-HYDROcodone (TUSSIONEX) 10-8 MG/5ML suspension 5 mL  5 mL Oral Q12H PRN John Giovanni, MD      . guaiFENesin-dextromethorphan (ROBITUSSIN DM) 100-10 MG/5ML syrup 10 mL  10 mL Oral Q4H PRN John Giovanni, MD      . heparin ADULT infusion 100 units/mL (25000 units/29mL sodium chloride 0.45%)  800 Units/hr Intravenous Continuous Silvana Newness, RPH 8 mL/hr at 12/31/19 1816 800 Units/hr at 12/31/19 1816  . metoprolol tartrate (LOPRESSOR) injection 2.5 mg  2.5 mg Intravenous Q4H PRN Elgergawy, Leana Roe, MD   2.5 mg at 12/31/19 0751  . zinc sulfate capsule 220 mg  220 mg Oral Daily John Giovanni, MD   220 mg at 12/31/19 1126    REVIEW OF SYSTEMS: Significant dementia. Unable to obtain.   PHYSICAL EXAM:   Vitals:   12/31/19 1209 12/31/19 1600 12/31/19 1700 12/31/19 1812  BP: 121/86 (!) 118/99 130/86 128/75  Pulse:  (!) 104 (!) 56 (!) 102  Resp:  Marland Kitchen)  26 (!) 35 14  Temp:    98.5 F (36.9 C)  TempSrc:    Axillary  SpO2:  96% 96% 94%  Weight:      Height:       Body mass index is 23.03 kg/m. GENERAL: The patient is a well-nourished female, in no acute distress. The vital signs are documented above. CARDIAC: There is a regular rate and rhythm.  VASCULAR: No carotid bruits. On the right she has a palpable femoral and diminished popliteal pulse. She has a good DP and PT signal with the doppler On the left, she has a palpable femoral, popliteal, and DP pulse. She has a brisk DP and PT signal with the doppler.  Both feet appear adequately perfused with good venous filling.  PULMONARY: There is good air exchange bilaterally without wheezing or rales. ABDOMEN: Soft and non-tender with normal pitched bowel sounds.    MUSCULOSKELETAL: There are no major deformities. NEUROLOGIC: No focal weakness or paresthesias are detected. SKIN: There are no ulcers or rashes noted. PSYCHIATRIC: The patient has a normal affect.  DATA:    Lab Results  Component Value Date   WBC 7.1 12/30/2019   HGB 15.3 (H) 12/30/2019   HCT 47.4 (H) 12/30/2019   MCV 95.2 12/30/2019   PLT 211 12/30/2019   Lab Results  Component Value Date   NA 136 12/30/2019   K 3.7 12/30/2019   CL 99 12/30/2019   CO2 22 12/30/2019   Lab Results  Component Value Date   CREATININE 0.80 12/30/2019   Lab Results  Component Value Date   INR 1.3 (H) 12/03/2019   INR 1.0 11/23/2019   INR 1.00 10/20/2013   Lab Results  Component Value Date   HGBA1C 6.7 (H) 12/08/2019    Deitra Mayo [facility]: [phone] Office: [phone]

## 2019-12-31 NOTE — ED Notes (Signed)
Heparin drip initiated per MAR. Pt remains resting on cart in NAD on continuous monitors. Pt repositioned. VSS. Breathing easy, non-labored. Hourly rounds completed. Will continue to monitor

## 2019-12-31 NOTE — ED Notes (Signed)
Lunch Tray Ordered @ 1036. 

## 2019-12-31 NOTE — Progress Notes (Addendum)
PROGRESS NOTE                                                                                                                                                                                                             Patient Demographics:    Shirley [name], is a 86 y.o. female, DOB - 09/01/1934, VEL:[id number]  Admit date - 12/30/2019   Admitting Physician Albertine Patricia, MD  Outpatient Primary MD for the patient is Dorena Cookey, MD (Inactive)  LOS - 1   Chief Complaint  Patient presents with  . Altered Mental Status       Brief Narrative    This is a no charge note, as patient was admitted earlier today by Dr. Marlowe Sax, patient was seen and examined, chart, imaging and labs were reviewed.  86 y.o. female with medical history significant of hypertension, polymyalgia rheumatica, dementia, SVT presenting to the ED for evaluation of altered mental status. Patient was recently admitted to the hospital from 12/28-1/9 for COVID pneumonia and HCAP.  She was treated with remdesivir, steroids, and antibiotics.  She was discharged to [facility] rehab facility, and day of admission she was discharged from the rehab facility to her ALF, patient presents from facility secondary to increased weakness, lethargy and altered mental status, she was noted to be in A. fib with RVR.   Subjective:    Areta Haber today for generalized weakness, mild dyspnea, denies any chest pain, fever or chills.    Assessment  & Plan :    Principal Problem:   HCAP (healthcare-associated pneumonia) Active Problems:   Acute encephalopathy   New onset atrial fibrillation (HCC)   Pneumonia  A. fib with RVR -New onset, so blood pressure earlier today, with uncontrolled heart rate, patient started on amiodarone drip . -On heparin GTT for anticoagulation . -Cardiology consult greatly appreciated , further management per cardiology .  HCAP - Patient was recently admitted to  the hospital from 12/28-1/9 for COVID pneumonia and HCAP.  - She was treated with remdesivir, steroids, and antibiotics.  -She was started empirically on vancomycin and cefepime, given normal procalcitonin, I will DC vancomycin today, and will DC cefepime in 24 hours.  Pulmonary embolism -CTA chest PE protocol significant for right lung PE, continue with heparin GTT, transition to DOAC once stable  Acute metabolic encephalopathy - In  setting of underlying dementia , with acute illness secondary to her pneumonia, A. fib with RVR and soft blood pressure . -Ammonia normal limit, H within normal limit, B12 is elevated.   Addendum: -Upon patient's evaluation by the nursing staff when she got to 6 E., patient was noted not to have palpable pedal pulses in the right foot, patient herself denies any pain, or complaints, pulses could not be palpated by Dopplers, patient had slow capillary refills, but no evidence of significant cyanosis, have discussed with Dr. Scot Dock, he will evaluate right foot.   COVID-19 Labs  Recent Labs    12/31/19 0212  DDIMER 5.37*  FERRITIN 177  LDH 206*  CRP 0.6    Lab Results  Component Value Date   SARSCOV2NAA NEGATIVE 12/31/2019   SARSCOV2NAA POSITIVE (A) 12/02/2019   [place] NEGATIVE 11/27/2019   [place] NEGATIVE 11/23/2019     Code Status : DNR  Family Communication  : Cussed with son via phone  Disposition Plan  : Patient was just discharged from [facility]NF[facility] day of admission, will have PT reevaluate the patient when she is medically stable to determine she needs SNF versus ALF.  Barriers For Discharge : Remains on amiodarone drip, IV antibiotics, heart rate uncontrolled  Consults  :  Cardiology  Procedures  : None  DVT Prophylaxis  :  Heparin GTT  Lab Results  Component Value Date   PLT 211 12/30/2019    Antibiotics  :    Anti-infectives (From admission, onward)   Start     Dose/Rate Route  Frequency Ordered Stop   12/31/19 2200  vancomycin (VANCOREADY) IVPB 750 mg/150 mL     750 mg 150 mL/hr over 60 Minutes Intravenous Every 24 hours 12/31/19 0022     12/31/19 1000  ceFEPIme (MAXIPIME) 2 g in sodium chloride 0.9 % 100 mL IVPB     2 g 200 mL/hr over 30 Minutes Intravenous Every 12 hours 12/31/19 0022     12/31/19 0015  vancomycin (VANCOREADY) IVPB 1250 mg/250 mL     1,250 mg 166.7 mL/hr over 90 Minutes Intravenous  Once 12/31/19 0014 12/31/19 0353   12/31/19 0015  ceFEPIme (MAXIPIME) 2 g in sodium chloride 0.9 % 100 mL IVPB     2 g 200 mL/hr over 30 Minutes Intravenous  Once 12/31/19 0014 12/31/19 0300   12/30/19 2345  piperacillin-tazobactam (ZOSYN) IVPB 3.375 g  Status:  Discontinued     3.375 g 100 mL/hr over 30 Minutes Intravenous  Once 12/30/19 2336 12/31/19 0008        Objective:   Vitals:   12/31/19 1030 12/31/19 1100 12/31/19 1130 12/31/19 1209  BP: (!) 128/102 (!) 113/96 126/85 121/86  Pulse:  (!) 104 (!) 118   Resp:  19 (!) 27   Temp:      TempSrc:      SpO2:  100% 96%   Weight:      Height:        Wt Readings from Last 3 Encounters:  12/30/19 59 kg  12/02/19 59.5 kg  11/25/19 59.5 kg    No intake or output data in the 24 hours ending 12/31/19 1356   Physical Exam  Awake Alert, confused, frail, easily distracted  Symmetrical Chest wall movement, Good air movement bilaterally, CTAB Irregular irregular,No Gallops,Rubs or new Murmurs, No Parasternal Heave +ve B.Sounds, Abd Soft, No tenderness,No rebound - guarding or rigidity. No Cyanosis, Clubbing or edema, No new Rash or bruise  Data Review:    CBC Recent Labs  Lab 12/30/19 2031  WBC 7.1  HGB 15.3*  HCT 47.4*  PLT 211  MCV 95.2  MCH 30.7  MCHC 32.3  RDW 15.6*  LYMPHSABS 0.9  MONOABS 0.9  EOSABS 0.2  BASOSABS 0.1    Chemistries  Recent Labs  Lab 12/30/19 2031  NA 136  K 3.7  CL 99  CO2 22  GLUCOSE 182*  BUN 13  CREATININE 0.80  CALCIUM 9.0  AST 24  ALT 20    ALKPHOS 82  BILITOT 0.6   ------------------------------------------------------------------------------------------------------------------ No results for input(s): CHOL, HDL, LDLCALC, TRIG, CHOLHDL, LDLDIRECT in the last 72 hours.  Lab Results  Component Value Date   HGBA1C 6.7 (H) 12/08/2019   ------------------------------------------------------------------------------------------------------------------ Recent Labs    12/31/19 0212  TSH 1.397   ------------------------------------------------------------------------------------------------------------------ Recent Labs    12/31/19 0212  VITAMINB12 4,445*  FERRITIN 177    Coagulation profile No results for input(s): INR, PROTIME in the last 168 hours.  Recent Labs    12/31/19 0212  DDIMER 5.37*    Cardiac Enzymes No results for input(s): CKMB, TROPONINI, MYOGLOBIN in the last 168 hours.  Invalid input(s): CK ------------------------------------------------------------------------------------------------------------------ No results found for: BNP  Inpatient Medications  Scheduled Meds: . vitamin C  500 mg Oral Daily  . dexamethasone (DECADRON) injection  6 mg Intravenous Q24H  . zinc sulfate  220 mg Oral Daily   Continuous Infusions: . amiodarone 60 mg/hr (12/31/19 0945)   Followed by  . amiodarone    . ceFEPime (MAXIPIME) IV Stopped (12/31/19 1212)  . heparin 900 Units/hr (12/31/19 0546)  . vancomycin     PRN Meds:.acetaminophen, albuterol, chlorpheniramine-HYDROcodone, guaiFENesin-dextromethorphan, metoprolol tartrate  Micro Results Recent Results (from the past 240 hour(s))  Culture, blood (routine x 2)     Status: None (Preliminary result)   Collection Time: 12/31/19  2:12 AM   Specimen: BLOOD LEFT HAND  Result Value Ref Range Status   Specimen Description BLOOD LEFT HAND  Final   Special Requests   Final    BOTTLES DRAWN AEROBIC AND ANAEROBIC Blood Culture adequate volume   Culture   Final     NO GROWTH < 12 HOURS Performed at [facility], [place] [address], [place], [place] [address]    Report Status PENDING  Incomplete  Respiratory Panel by PCR     Status: None   Collection Time: 12/31/19  2:12 AM   Specimen: Flu Kit Nasopharyngeal Swab; Respiratory  Result Value Ref Range Status   Adenovirus NOT DETECTED NOT DETECTED Final   Coronavirus 229E NOT DETECTED NOT DETECTED Final    Comment: (NOTE) The Coronavirus on the Respiratory Panel, DOES NOT test for the novel  Coronavirus (2019 nCoV)    Coronavirus HKU1 NOT DETECTED NOT DETECTED Final   Coronavirus NL63 NOT DETECTED NOT DETECTED Final   Coronavirus OC43 NOT DETECTED NOT DETECTED Final   Metapneumovirus NOT DETECTED NOT DETECTED Final   Rhinovirus / Enterovirus NOT DETECTED NOT DETECTED Final   Influenza A NOT DETECTED NOT DETECTED Final   Influenza B NOT DETECTED NOT DETECTED Final   Parainfluenza Virus 1 NOT DETECTED NOT DETECTED Final   Parainfluenza Virus 2 NOT DETECTED NOT DETECTED Final   Parainfluenza Virus 3 NOT DETECTED NOT DETECTED Final   Parainfluenza Virus 4 NOT DETECTED NOT DETECTED Final   Respiratory Syncytial Virus NOT DETECTED NOT DETECTED Final   Bordetella pertussis NOT DETECTED NOT DETECTED Final   Chlamydophila pneumoniae NOT DETECTED  NOT DETECTED Final   Mycoplasma pneumoniae NOT DETECTED NOT DETECTED Final    Comment: Performed at [facility], [place] [address] [place], [place] [address]  SARS CORONAVIRUS 2 (TAT 6-24 HRS) Nasopharyngeal Flu Kit Nasopharyngeal Swab     Status: None   Collection Time: 12/31/19  2:12 AM   Specimen: Flu Kit Nasopharyngeal Swab  Result Value Ref Range Status   SARS Coronavirus 2 NEGATIVE NEGATIVE Final    Comment: (NOTE) SARS-CoV-2 target nucleic acids are NOT DETECTED. The SARS-CoV-2 RNA is generally detectable in upper and lower respiratory specimens during the acute phase of infection. Negative results do not preclude SARS-CoV-2 infection, do not  rule out co-infections with other pathogens, and should not be used as the sole basis for treatment or other patient management decisions. Negative results must be combined with clinical observations, patient history, and epidemiological information. The expected result is Negative. Fact Sheet for Patients: SugarRoll.be Fact Sheet for Healthcare Providers: https://www.woods-mathews.com/ This test is not yet approved or cleared by the [place] FDA and  has been authorized for detection and/or diagnosis of SARS-CoV-2 by FDA under an Emergency Use Authorization (EUA). This EUA will remain  in effect (meaning this test can be used) for the duration of the COVID-19 declaration under Section 56 4(b)(1) of the Act, 21 U.S.C. section 360bbb-3(b)(1), unless the authorization is terminated or revoked sooner. Performed at [facility], [place] [address] [place], [place] [address]   Culture, blood (routine x 2)     Status: None (Preliminary result)   Collection Time: 12/31/19  3:00 AM   Specimen: BLOOD RIGHT HAND  Result Value Ref Range Status   Specimen Description BLOOD RIGHT HAND  Final   Special Requests   Final    BOTTLES DRAWN AEROBIC AND ANAEROBIC Blood Culture results may not be optimal due to an inadequate volume of blood received in culture bottles   Culture   Final    NO GROWTH < 12 HOURS Performed at [facility], [place] [address] [place], [place] [address]    Report Status PENDING  Incomplete    Radiology Reports CT HEAD WO CONTRAST  Result Date: 12/31/2019 CLINICAL DATA:  Encephalopathy, history of hypertension EXAM: CT HEAD WITHOUT CONTRAST TECHNIQUE: Contiguous axial images were obtained from the base of the skull through the vertex without intravenous contrast. COMPARISON:  Perfusion study 11/23/2019, 11/24/2019 FINDINGS: Brain: Diffuse atrophy and ventricular dilatation is stable from prior comparisons. Notable atrophy  of the mesial temporal lobes, also unchanged from prior. 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 No convincing evidence of acute infarction, hemorrhage, hydrocephalus, extra-axial collection or mass lesion/mass effect. Patchy areas of white matter hypoattenuation are most compatible with advanced chronic microvascular angiopathy. Vascular: Atherosclerotic calcification of the carotid siphons and intradural vertebral arteries. No hyperdense vessel. Skull: No calvarial fracture or suspicious osseous lesion. No scalp swelling or hematoma. Sinuses/Orbits: Pneumatized secretions with air-fluid level in the left maxillary sinus. Background of mild bilateral maxillary mucosal thickening as well. Remaining paranasal sinuses and mastoids are predominantly clear. Orbital structures are unremarkable aside from prior lens extractions. Other: None IMPRESSION: 1. No acute intracranial abnormality. 2. Stable chronic changes including sequela of a remote cortically based left frontal lobe infarct, diffuse parenchymal atrophy including atrophy of the mesial temporal lobes and advanced chronic microvascular angiopathy. 3. Pneumatized secretions with air-fluid level in the left maxillary sinus. Correlate for signs of acute sinusitis. Electronically Signed   By: March Rummage  [facility] M.D.   On: 12/31/2019 04:58   DG Chest Port 1 View  Result Date: 12/30/2019 CLINICAL DATA:  Shortness of breath and weakness. Recent COVID positive. EXAM: PORTABLE CHEST 1 VIEW COMPARISON:  12/07/2019 a prior radiographs FINDINGS: Mild cardiomegaly again noted. Bilateral LOWER lung airspace opacities are stable to slightly increased. No pleural effusion or pneumothorax. No acute bony abnormalities. RIGHT shoulder arthroplasty changes again identified. IMPRESSION: Stable to slightly increased bilateral LOWER lung airspace opacities/pneumonia. Electronically Signed   By: Margarette Canada M.D.   On: 12/30/2019 21:06   DG CHEST PORT 1  VIEW  Result Date: 12/07/2019 CLINICAL DATA:  Hypoxia. EXAM: PORTABLE CHEST 1 VIEW COMPARISON:  December 02, 2019. FINDINGS: Stable cardiomegaly. No pneumothorax or pleural effusion is noted. Status post right total shoulder arthroplasty. Increased left midlung opacity is noted with increased right basilar opacity concerning for worsening multifocal pneumonia. IMPRESSION: Increased left midlung opacity is noted with increased right basilar opacity concerning for worsening multifocal pneumonia. Electronically Signed   By: Marijo Conception M.D.   On: 12/07/2019 08:37   DG Chest Portable 1 View  Result Date: 12/02/2019 CLINICAL DATA:  Hypoxia EXAM: PORTABLE CHEST 1 VIEW COMPARISON:  November 23, 2019 FINDINGS: There is somewhat ill-defined airspace opacity in the right lower lung region. There is mild atelectatic change in the right mid and lower lung regions as well as in the left mid lung and left base regions without frank consolidation. Heart is upper normal in size with pulmonary vascularity normal. No adenopathy. There is a fairly sizable hiatal hernia. There is aortic atherosclerosis. No adenopathy. There is a total shoulder replacement on the right. IMPRESSION: Ill-defined airspace opacity on the right in the right lower lung region, consistent with developing pneumonia. Areas of mild atelectatic change in each mid and lower lung zone region Heart size within normal limits. Aortic Atherosclerosis (ICD10-I70.0). Hiatal hernia present. Electronically Signed   By: Lowella Grip III M.D.   On: 12/02/2019 14:42   ECHOCARDIOGRAM COMPLETE  Result Date: 12/31/2019   ECHOCARDIOGRAM REPORT   Patient Name:   MIRAYA CUDNEY Date of Exam: 12/31/2019 Medical Rec #:  [id number]       Height:       63.0 in Accession #:    [id number]      Weight:       130.0 lb Date of Birth:  Nov 25, 1934       BSA:          1.61 m Patient Age:    89 years        BP:           109/76 mmHg Patient Gender: F               HR:            113 bpm. Exam Location:  Inpatient Procedure: 2D Echo, Cardiac Doppler and Color Doppler Indications:    Atrial Fibrillation 427.31  History:        Patient has no prior history of Echocardiogram examinations.                 Risk Factors:Hypertension and Former Smoker.  Sonographer:    Paulita Fujita RDCS Referring Phys: [id number] [facility]  Sonographer Comments: Patient did not understand commands. IMPRESSIONS  1. Left ventricular ejection fraction, by visual estimation, is 60 to 65%. The left ventricle has normal function. There is no left ventricular hypertrophy.  2. Left ventricular diastolic function could not be evaluated.  3. The left ventricle has no regional wall motion abnormalities.  4. Global right ventricle has normal systolic function.The right ventricular size is normal. No increase in right ventricular wall thickness.  5. Left atrial size was severely dilated.  6. Right atrial size was normal.  7. The mitral valve is normal in structure. No evidence of mitral valve regurgitation.  8. The tricuspid valve is normal in structure.  9. The tricuspid valve is normal in structure. Tricuspid valve regurgitation is trivial. 10. The aortic valve is tricuspid. Aortic valve regurgitation is trivial. Mild aortic valve sclerosis without stenosis. 11. The pulmonic valve was grossly normal. Pulmonic valve regurgitation is not visualized. 12. Normal pulmonary artery systolic pressure. 13. The inferior vena cava is normal in size with greater than 50% respiratory variability, suggesting right atrial pressure of 3 mmHg. FINDINGS  Left Ventricle: Left ventricular ejection fraction, by visual estimation, is 60 to 65%. The left ventricle has normal function. The left ventricle has no regional wall motion abnormalities. The left ventricular internal cavity size was the left ventricle is normal in size. There is no left ventricular hypertrophy. The left ventricular diastology could not be evaluated due to atrial  fibrillation. Left ventricular diastolic function could not be evaluated. Right Ventricle: The right ventricular size is normal. No increase in right ventricular wall thickness. Global RV systolic function is has normal systolic function. The tricuspid regurgitant velocity is 2.59 m/s, and with an assumed right atrial pressure  of 3 mmHg, the estimated right ventricular systolic pressure is normal at 29.8 mmHg. Left Atrium: Left atrial size was severely dilated. Right Atrium: Right atrial size was normal in size Pericardium: There is no evidence of pericardial effusion. Mitral Valve: The mitral valve is normal in structure. No evidence of mitral valve regurgitation. Tricuspid Valve: The tricuspid valve is normal in structure. Tricuspid valve regurgitation is trivial. Aortic Valve: The aortic valve is tricuspid. Aortic valve regurgitation is trivial. Mild aortic valve sclerosis is present, with no evidence of aortic valve stenosis. Pulmonic Valve: The pulmonic valve was grossly normal. Pulmonic valve regurgitation is not visualized. Pulmonic regurgitation is not visualized. Aorta: The aortic root is normal in size and structure. Venous: The inferior vena cava is normal in size with greater than 50% respiratory variability, suggesting right atrial pressure of 3 mmHg. IAS/Shunts: No atrial level shunt detected by color flow Doppler.  LEFT VENTRICLE PLAX 2D LVIDd:         4.10 cm LVIDs:         3.10 cm LV PW:         0.90 cm LV IVS:        0.90 cm LVOT diam:     1.70 cm LV SV:         36 ml LV SV Index:   22.34 LVOT Area:     2.27 cm  LV Volumes (MOD) LV area d, A2C:    18.90 cm LV area d, A4C:    21.30 cm LV area s, A2C:    14.10 cm LV area s, A4C:    14.00 cm LV major d, A2C:   5.88 cm LV major d, A4C:   6.34 cm LV major s, A2C:   5.36 cm LV major s, A4C:   5.45 cm LV vol d, MOD A2C: 51.4 ml LV vol d, MOD A4C: 60.0 ml LV vol s, MOD A2C: 32.4 ml LV vol s, MOD A4C: 32.2 ml LV SV MOD A2C:     19.0 ml  LV SV MOD A4C:      60.0 ml LV SV MOD BP:      25.1 ml RIGHT VENTRICLE TAPSE (M-mode): 1.1 cm LEFT ATRIUM             Index       RIGHT ATRIUM           Index LA diam:        4.00 cm 2.48 cm/m  RA Area:     10.99 cm LA Vol (A2C):   60.0 ml 37.26 ml/m RA Volume:   21.00 ml  13.04 ml/m LA Vol (A4C):   63.5 ml 39.44 ml/m LA Biplane Vol: 62.6 ml 38.88 ml/m  AORTIC VALVE LVOT Vmax:   73.30 cm/s LVOT Vmean:  48.800 cm/s LVOT VTI:    0.119 m  AORTA Ao Root diam: 2.70 cm TRICUSPID VALVE TR Peak grad:   26.8 mmHg TR Vmax:        259.00 cm/s  SHUNTS Systemic VTI:  0.12 m Systemic Diam: 1.70 cm  Dani Gobble Croitoru MD Electronically signed by Sanda Klein MD Signature Date/Time: 12/31/2019/10:19:25 AM    Final     Time Spent in minutes  : No Charge   Phillips Climes M.D on 12/31/2019 at 1:56 PM  Between 7am to 7pm - Pager - [phone]  After 7pm go to [email or web address] - password [facility]  [facility] -  Office  [phone]

## 2019-12-31 NOTE — Progress Notes (Addendum)
ANTICOAGULATION CONSULT NOTE - Initial Consult  Pharmacy Consult for heparin Indication: atrial fibrillation  No Known Allergies  Patient Measurements: Height: 5\' 3"  (160 cm) Weight: 130 lb (59 kg) IBW/kg (Calculated) : 52.4  Vital Signs: Temp: 96.6 F (35.9 C) (01/25 2007) Temp Source: Rectal (01/25 2007) BP: 109/72 (01/25 2315) Pulse Rate: 86 (01/25 2315)  Labs: Recent Labs    12/30/19 2031  HGB 15.3*  HCT 47.4*  PLT 211  CREATININE 0.80    Estimated Creatinine Clearance: 44.1 mL/min (by C-G formula based on SCr of 0.8 mg/dL).   Medical History: Past Medical History:  Diagnosis Date  . Arthritis    "right shoulder; that's why I had to have it replaced"  . Hypertension   . Osteoarthritis   . Osteoporosis   . PMR (polymyalgia rheumatica) [facility])     Assessment: 86yo female admitted with HCAP, had been admitted a little less than a month ago with Covid-19, also found to be in Afib, no known h/o Afib, to begin heparin.  Goal of Therapy:  Heparin level 0.3-0.7 units/ml Monitor platelets by anticoagulation protocol: Yes   Plan:  Will give heparin 2000 units IV bolus x1 followed by gtt at 900 units/hr and monitor heparin levels and CBC.  86yo, PharmD, BCPS  12/31/2019,12:33 AM   Addendum: Heparin start was held to confirm no abnormalities on head CT.  D/w Dr 01/02/2020, will start heparin now after negative CT.  VB 5:30 AM

## 2019-12-31 NOTE — Progress Notes (Signed)
Pharmacy Antibiotic Note  Shirley [name] is a 86 y.o. female admitted on 12/30/2019 with pneumonia.  Pharmacy has been consulted for vancomycin and cefepime dosing.  Plan: Vancomycin 1250mg  x1 then 750mg  IV Q24H. Goal AUC 400-550.  Expected AUC 430.  SCr used 0.8.  Cefepime 2g IV Q12H.  Height: 5\' 3"  (160 cm) Weight: 130 lb (59 kg) IBW/kg (Calculated) : 52.4  Temp (24hrs), Avg:96.6 F (35.9 C), Min:96.6 F (35.9 C), Max:96.6 F (35.9 C)  Recent Labs  Lab 12/30/19 2031  WBC 7.1  CREATININE 0.80  LATICACIDVEN 3.9*    Estimated Creatinine Clearance: 44.1 mL/min (by C-G formula based on SCr of 0.8 mg/dL).    No Known Allergies   Thank you for allowing pharmacy to be a part of this patient's care.  , PharmD, BCPS  12/31/2019 12:22 AM

## 2019-12-31 NOTE — ED Notes (Signed)
Care endorsed to Kehinde, RN 

## 2019-12-31 NOTE — ED Notes (Signed)
MD Elgergawy paged due to pt's HR in 120s-140s in Afib

## 2019-12-31 NOTE — ED Notes (Signed)
Pt taken to and from CT scan with this RN on continuous monitors.

## 2019-12-31 NOTE — Progress Notes (Addendum)
Patient transferred from ED at 1812hrs. Afib with HR 100s on arrival.  Patient oriented to self only.  On amiodarone gtt and heparin gtt IV.  Noted to have purple cold toes on right foot.  Dr. Randol Kern notified. Will doppler pedal pulses.

## 2019-12-31 NOTE — ED Notes (Signed)
Patient assisted with breakfast. Patient ate about 40% of meal

## 2019-12-31 NOTE — ED Notes (Signed)
Assumed care of pt. Pt resting on cart in NAD. VSS on monitors. Equal rise and fall of chest noted. Fluid bolus initiated per MAR.

## 2019-12-31 NOTE — Progress Notes (Signed)
ANTICOAGULATION CONSULT NOTE  Pharmacy Consult for heparin Indication: atrial fibrillation  No Known Allergies  Patient Measurements: Height: 5\' 3"  (160 cm) Weight: 130 lb (59 kg) IBW/kg (Calculated) : 52.4  Vital Signs: BP: 130/86 (01/26 1700) Pulse Rate: 56 (01/26 1700)  Labs: Recent Labs    12/30/19 2031 12/31/19 1720  HGB 15.3*  --   HCT 47.4*  --   PLT 211  --   HEPARINUNFRC  --  0.77*  CREATININE 0.80  --     Estimated Creatinine Clearance: 44.1 mL/min (by C-G formula based on SCr of 0.8 mg/dL).   Medical History: Past Medical History:  Diagnosis Date  . Arthritis    "right shoulder; that's why I had to have it replaced"  . Hypertension   . Osteoarthritis   . Osteoporosis   . PMR (polymyalgia rheumatica) [facility])     Assessment: 86yo female admitted with HCAP, had been admitted a little less than a month ago with Covid-19, also found to be in Afib, no known h/o Afib. 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  Listed under [facility].

## 2020-01-01 LAB — CBC
HCT: 41.3 % (ref 36.0–46.0)
Hemoglobin: 14.1 g/dL (ref 12.0–15.0)
MCH: 31.2 pg (ref 26.0–34.0)
MCHC: 34.1 g/dL (ref 30.0–36.0)
MCV: 91.4 fL (ref 80.0–100.0)
Platelets: 240 10*3/uL (ref 150–400)
RBC: 4.52 MIL/uL (ref 3.87–5.11)
RDW: 15.8 % — ABNORMAL HIGH (ref 11.5–15.5)
WBC: 6.6 10*3/uL (ref 4.0–10.5)
nRBC: 0 % (ref 0.0–0.2)

## 2020-01-01 LAB — COMPREHENSIVE METABOLIC PANEL
ALT: 17 U/L (ref 0–44)
AST: 20 U/L (ref 15–41)
Albumin: 2.6 g/dL — ABNORMAL LOW (ref 3.5–5.0)
Alkaline Phosphatase: 78 U/L (ref 38–126)
Anion gap: 10 (ref 5–15)
BUN: 13 mg/dL (ref 8–23)
CO2: 23 mmol/L (ref 22–32)
Calcium: 8.7 mg/dL — ABNORMAL LOW (ref 8.9–10.3)
Chloride: 101 mmol/L (ref 98–111)
Creatinine, Ser: 0.59 mg/dL (ref 0.44–1.00)
GFR calc Af Amer: >60 mL/min (ref >60)
GFR calc non Af Amer: >60 mL/min (ref >60)
Glucose, Bld: 122 mg/dL — ABNORMAL HIGH (ref 70–99)
Potassium: 3.5 mmol/L (ref 3.5–5.1)
Sodium: 134 mmol/L — ABNORMAL LOW (ref 135–145)
Total Bilirubin: 0.6 mg/dL (ref 0.3–1.2)
Total Protein: 5.6 g/dL — ABNORMAL LOW (ref 6.5–8.1)

## 2020-01-01 LAB — HEPARIN LEVEL (UNFRACTIONATED)
Heparin Unfractionated: 0.4 IU/mL (ref 0.30–0.70)
Heparin Unfractionated: 0.46 IU/mL (ref 0.30–0.70)

## 2020-01-01 MED ORDER — ENSURE ENLIVE PO LIQD
237.0000 mL | Freq: Two times a day (BID) | ORAL | Status: DC
  Administered 2020-01-01 – 2020-01-02 (×2): 237 mL via ORAL

## 2020-01-01 MED ORDER — METOPROLOL TARTRATE 25 MG PO TABS
25.0000 mg | ORAL_TABLET | Freq: Two times a day (BID) | ORAL | Status: DC
  Administered 2020-01-01 (×2): 25 mg via ORAL
  Filled 2020-01-01 (×2): qty 1

## 2020-01-01 NOTE — Progress Notes (Signed)
Initial Nutrition Assessment  INTERVENTION:   -Ensure Enlive po BID, each supplement provides 350 kcal and 20 grams of protein  NUTRITION DIAGNOSIS:   Inadequate oral intake related to dysphagia, lethargy/confusion as evidenced by (chart review).  GOAL:   Patient will meet greater than or equal to 90% of their needs  MONITOR:   PO intake, Supplement acceptance, Labs, Weight trends, I & O's  REASON FOR ASSESSMENT:   Consult Assessment of nutrition requirement/status  ASSESSMENT:   86 y.o. female with medical history significant of hypertension, polymyalgia rheumatica, dementia, SVT presenting to the ED for evaluation of altered mental status. Patient was recently admitted to the hospital from 12/28-1/9 for COVID pneumonia and HCAP.  She was treated with remdesivir, steroids, and antibiotics.  She was discharged to [facility] rehab facility.  Today she was discharged from the rehab facility back to [facility] which is her assisted living facility.  Per staff there, patient arrived hypotensive with decreased responsiveness and was clammy.  Blood pressure in the 80s.  She was sent to the ED for further evaluation. Admitted for HCAP.  **RD working remotely**  Patient with dementia, alert/oriented to person only. Pt was recently discharged from previous admission for COVID-19 infection. Pt was receiving Ensure supplements during that admission. Will order them again for this admission.  Per SLP evaluation today, pt with dysphagia and mild aspiration risk. Dementia most likely contributes to swallowing dysfunction. Recommend dysphagia 2 diet with full supervision of self feeding.  Pt will receive Magic cups with lunch/dinner meals on this diet. Pt consumed 50% of breakfast this morning.   Per weight records, pt's weight has remained stable.  Medications: Vitamin C tablet, Zinc sulfate capsule Labs reviewed: Low Na   NUTRITION - FOCUSED PHYSICAL EXAM:  Working remotely.  Diet  Order:   Diet Order            DIET DYS 2 Room service appropriate? No; Fluid consistency: Thin  Diet effective now              EDUCATION NEEDS:   Not appropriate for education at this time  Skin:  Skin Assessment: Reviewed RN Assessment  Last BM:  1/26  Height:   Ht Readings from Last 1 Encounters:  12/30/19 5\' 3"  (1.6 m)    Weight:   Wt Readings from Last 1 Encounters:  01/01/20 55.8 kg    Ideal Body Weight:  52.3 kg  BMI:  Body mass index is 21.79 kg/m.  Estimated Nutritional Needs:   Kcal:  1400-1600  Protein:  70-80g  Fluid:  1.6L/day  01/03/20, MS, RD, LDN Inpatient Clinical Dietitian Pager: [phone] After Hours Pager: [phone]

## 2020-01-01 NOTE — Consult Note (Signed)
Progress Note  Patient Name: Shirley [name] Date of Encounter: 01/01/2020  Primary Cardiologist: Reatha Harps, MD   Subjective   Oriented to person only - no SOB or chest pain  Inpatient Medications    Scheduled Meds: . vitamin C  500 mg Oral Daily  . zinc sulfate  220 mg Oral Daily   Continuous Infusions: . amiodarone 30 mg/hr (01/01/20 0600)  . ceFEPime (MAXIPIME) IV 2 g (12/31/19 2135)  . heparin 800 Units/hr (01/01/20 0600)   PRN Meds: acetaminophen, albuterol, chlorpheniramine-HYDROcodone, guaiFENesin-dextromethorphan, metoprolol tartrate   Vital Signs    Vitals:   01/01/20 0209 01/01/20 0309 01/01/20 0409 01/01/20 0509  BP: 109/84 120/72 109/78 125/77  Pulse: 94 74 80 94  Resp: 19 18 18 20   Temp:    98.1 F (36.7 C)  TempSrc:    Oral  SpO2: 94% 92% 94% 94%  Weight:    55.8 kg  Height:        Intake/Output Summary (Last 24 hours) at 01/01/2020 0804 Last data filed at 01/01/2020 0600 Gross per 24 hour  Intake 763.12 ml  Output --  Net 763.12 ml   Last 3 Weights 01/01/2020 12/30/2019 12/02/2019  Weight (lbs) 123 lb 0.3 oz 130 lb 131 lb 2.8 oz  Weight (kg) 55.8 kg 58.968 kg 59.5 kg      Telemetry    A fib in the 90s at times slower at times 110 - Personally Reviewed  ECG    No new - Personally Reviewed  Physical Exam   GEN: No acute distress.   Neck: No JVD Cardiac: irreg irreg, no murmurs, rubs, or gallops.  Respiratory: diminished  to auscultation bilaterally. GI: Soft, nontender, non-distended  MS: No edema; No deformity. Neuro:  Nonfocal  Psych: Normal affect   Labs    High Sensitivity Troponin:  No results for input(s): TROPONINIHS in the last 720 hours.    Chemistry Recent Labs  Lab 12/30/19 2031 01/01/20 0349  NA 136 134*  K 3.7 3.5  CL 99 101  CO2 22 23  GLUCOSE 182* 122*  BUN 13 13  CREATININE 0.80 0.59  CALCIUM 9.0 8.7*  PROT 5.8* 5.6*  ALBUMIN 2.8* 2.6*  AST 24 20  ALT 20 17  ALKPHOS 82 78  BILITOT 0.6 0.6   GFRNONAA >60 >60  GFRAA >60 >60  ANIONGAP 15 10     Hematology Recent Labs  Lab 12/30/19 2031 01/01/20 0349  WBC 7.1 6.6  RBC 4.98 4.52  HGB 15.3* 14.1  HCT 47.4* 41.3  MCV 95.2 91.4  MCH 30.7 31.2  MCHC 32.3 34.1  RDW 15.6* 15.8*  PLT 211 240    BNPNo results for input(s): BNP, PROBNP in the last 168 hours.   DDimer  Recent Labs  Lab 12/31/19 0212  DDIMER 5.37*     Radiology    CT HEAD WO CONTRAST  Result Date: 12/31/2019 CLINICAL DATA:  Encephalopathy, history of hypertension EXAM: CT HEAD WITHOUT CONTRAST TECHNIQUE: Contiguous axial images were obtained from the base of the skull through the vertex without intravenous contrast. COMPARISON:  Perfusion study 11/23/2019, 11/24/2019 FINDINGS: Brain: Diffuse atrophy and ventricular dilatation is stable from prior comparisons. Notable atrophy of the mesial temporal lobes, also unchanged from prior. 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 No convincing evidence of acute infarction, hemorrhage, hydrocephalus, extra-axial collection or mass lesion/mass effect. Patchy areas of white matter hypoattenuation are most compatible with advanced chronic microvascular angiopathy. Vascular: Atherosclerotic  calcification of the carotid siphons and intradural vertebral arteries. No hyperdense vessel. Skull: No calvarial fracture or suspicious osseous lesion. No scalp swelling or hematoma. Sinuses/Orbits: Pneumatized secretions with air-fluid level in the left maxillary sinus. Background of mild bilateral maxillary mucosal thickening as well. Remaining paranasal sinuses and mastoids are predominantly clear. Orbital structures are unremarkable aside from prior lens extractions. Other: None IMPRESSION: 1. No acute intracranial abnormality. 2. Stable chronic changes including sequela of a remote cortically based left frontal lobe infarct, diffuse parenchymal atrophy including atrophy of the mesial temporal lobes and  advanced chronic microvascular angiopathy. 3. Pneumatized secretions with air-fluid level in the left maxillary sinus. Correlate for signs of acute sinusitis. Electronically Signed   By: Lovena Le M.D.   On: 12/31/2019 04:58   CT ANGIO CHEST PE W OR WO CONTRAST  Result Date: 12/31/2019 CLINICAL DATA:  86 year old female with history of shortness of breath. COVID-19 infection. Evaluate for pulmonary embolism. EXAM: CT ANGIOGRAPHY CHEST WITH CONTRAST TECHNIQUE: Multidetector CT imaging of the chest was performed using the standard protocol during bolus administration of intravenous contrast. Multiplanar CT image reconstructions and MIPs were obtained to evaluate the vascular anatomy. CONTRAST:  140mL OMNIPAQUE IOHEXOL 350 MG/ML SOLN COMPARISON:  Chest CT 07/04/2015. FINDINGS: Cardiovascular: Multiple filling defects are noted within the right pulmonary arteries including distal right main, lobar to right upper and lower lobes and segmental and subsegmental sized right upper lobe branches. The majority of these are nonocclusive, although there does appear to be some occlusive disease in the right upper lobe. No definite filling defects are noted within the left-sided pulmonary arterial tree. Pulmonic trunk does not appear dilated. Mild cardiomegaly with biatrial dilatation, but no definitive findings to suggest right heart strain. There is aortic atherosclerosis, as well as atherosclerosis of the great vessels of the mediastinum and the coronary arteries, including calcified atherosclerotic plaque in the left main, left anterior descending, left circumflex and right coronary arteries. Aneurysmal dilatation of the ascending thoracic aorta (4.9 cm in diameter). Diffuse ectasia of the thoracic aorta with aneurysmal dilatation of the descending thoracic aorta which measures up to 4.3 cm in diameter at the level of the left atrium. Mediastinum/Nodes: No pathologically enlarged mediastinal or hilar lymph nodes. Large  hiatal hernia. No axillary lymphadenopathy. Lungs/Pleura: Patchy multifocal ground-glass attenuation, consolidative airspace disease and areas of septal thickening and interstitial prominence scattered throughout the lungs bilaterally, asymmetrically distributed, most confluent in the right lower lobe, compatible with multilobar pneumonia in the setting of known COVID-19 infection. No pleural effusions. Upper Abdomen: Aortic atherosclerosis. Musculoskeletal: Multiple chronic appearing compression fractures of T2, T3, T4, T5, T8, T9 and T12, most severe at T12 where there is 90% loss of anterior vertebral body height and acute kyphotic deformity. Multiple old healed left-sided rib fractures. There are no aggressive appearing lytic or blastic lesions noted in the visualized portions of the skeleton. Review of the MIP images confirms the above findings. IMPRESSION: 1. Study is positive for pulmonary embolism with large burden of clot in the right-sided pulmonary arteries, as above. 2. Multilobar bilateral pneumonia compatible with reported COVID-19 infection. 3. Aortic atherosclerosis, in addition to left main and 3 vessel coronary artery disease. There is also aneurysmal dilatation of the ascending thoracic aorta (4.9 cm in diameter) and descending thoracic aorta (4.3 cm in diameter). Recommend semi-annual imaging followup by CTA or MRA and referral to cardiothoracic surgery if not already obtained. This recommendation follows 2010 ACCF/AHA/AATS/ACR/ASA/SCA/SCAI/SIR/STS/SVM Guidelines for the Diagnosis and Management of Patients With Thoracic  Aortic Disease. Circulation. 2010; 121: A540-J811: E266-e369. Aortic aneurysm NOS (ICD10-I71.9). 4. Cardiomegaly with biatrial dilatation. 5. Additional incidental findings, as above. Critical Value/emergent results were called by telephone at the time of interpretation on 12/31/2019 at 2:52 pm to provider nurse Selena BattenKim in the ER, who verbally acknowledged these results. Aortic Atherosclerosis  (ICD10-I70.0). Aortic aneurysm NOS (ICD10-I71.9). Electronically Signed   By: Trudie Reedaniel  Entrikin M.D.   On: 12/31/2019 14:59   DG Chest Port 1 View  Result Date: 12/30/2019 CLINICAL DATA:  Shortness of breath and weakness. Recent COVID positive. EXAM: PORTABLE CHEST 1 VIEW COMPARISON:  12/07/2019 a prior radiographs FINDINGS: Mild cardiomegaly again noted. Bilateral LOWER lung airspace opacities are stable to slightly increased. No pleural effusion or pneumothorax. No acute bony abnormalities. RIGHT shoulder arthroplasty changes again identified. IMPRESSION: Stable to slightly increased bilateral LOWER lung airspace opacities/pneumonia. Electronically Signed   By: Harmon PierJeffrey  Hu M.D.   On: 12/30/2019 21:06   ECHOCARDIOGRAM COMPLETE  Result Date: 12/31/2019   ECHOCARDIOGRAM REPORT   Patient Name:   Alvy BealBARBARA B Treichler Date of Exam: 12/31/2019 Medical Rec #:  [id number]       Height:       63.0 in Accession #:    [id number]      Weight:       130.0 lb Date of Birth:  Mar 13, 1934       BSA:          1.61 m Patient Age:    85 years        BP:           109/76 mmHg Patient Gender: F               HR:           113 bpm. Exam Location:  Inpatient Procedure: 2D Echo, Cardiac Doppler and Color Doppler Indications:    Atrial Fibrillation 427.31  History:        Patient has no prior history of Echocardiogram examinations.                 Risk Factors:Hypertension and Former Smoker.  Sonographer:    Tonia GhentJulia Underwood RDCS Referring Phys: [id number] [facility] Hospital[facility]ASUNDHRA RATHORE  Sonographer Comments: Patient did not understand commands. IMPRESSIONS  1. Left ventricular ejection fraction, by visual estimation, is 60 to 65%. The left ventricle has normal function. There is no left ventricular hypertrophy.  2. Left ventricular diastolic function could not be evaluated.  3. The left ventricle has no regional wall motion abnormalities.  4. Global right ventricle has normal systolic function.The right ventricular size is normal. No increase in right  ventricular wall thickness.  5. Left atrial size was severely dilated.  6. Right atrial size was normal.  7. The mitral valve is normal in structure. No evidence of mitral valve regurgitation.  8. The tricuspid valve is normal in structure.  9. The tricuspid valve is normal in structure. Tricuspid valve regurgitation is trivial. 10. The aortic valve is tricuspid. Aortic valve regurgitation is trivial. Mild aortic valve sclerosis without stenosis. 11. The pulmonic valve was grossly normal. Pulmonic valve regurgitation is not visualized. 12. Normal pulmonary artery systolic pressure. 13. The inferior vena cava is normal in size with greater than 50% respiratory variability, suggesting right atrial pressure of 3 mmHg. FINDINGS  Left Ventricle: Left ventricular ejection fraction, by visual estimation, is 60 to 65%. The left ventricle has normal function. The left ventricle has no regional wall motion abnormalities. The left ventricular internal cavity size was the  left ventricle is normal in size. There is no left ventricular hypertrophy. The left ventricular diastology could not be evaluated due to atrial fibrillation. Left ventricular diastolic function could not be evaluated. Right Ventricle: The right ventricular size is normal. No increase in right ventricular wall thickness. Global RV systolic function is has normal systolic function. The tricuspid regurgitant velocity is 2.59 m/s, and with an assumed right atrial pressure  of 3 mmHg, the estimated right ventricular systolic pressure is normal at 29.8 mmHg. Left Atrium: Left atrial size was severely dilated. Right Atrium: Right atrial size was normal in size Pericardium: There is no evidence of pericardial effusion. Mitral Valve: The mitral valve is normal in structure. No evidence of mitral valve regurgitation. Tricuspid Valve: The tricuspid valve is normal in structure. Tricuspid valve regurgitation is trivial. Aortic Valve: The aortic valve is tricuspid. Aortic  valve regurgitation is trivial. Mild aortic valve sclerosis is present, with no evidence of aortic valve stenosis. Pulmonic Valve: The pulmonic valve was grossly normal. Pulmonic valve regurgitation is not visualized. Pulmonic regurgitation is not visualized. Aorta: The aortic root is normal in size and structure. Venous: The inferior vena cava is normal in size with greater than 50% respiratory variability, suggesting right atrial pressure of 3 mmHg. IAS/Shunts: No atrial level shunt detected by color flow Doppler.  LEFT VENTRICLE PLAX 2D LVIDd:         4.10 cm LVIDs:         3.10 cm LV PW:         0.90 cm LV IVS:        0.90 cm LVOT diam:     1.70 cm LV SV:         36 ml LV SV Index:   22.34 LVOT Area:     2.27 cm  LV Volumes (MOD) LV area d, A2C:    18.90 cm LV area d, A4C:    21.30 cm LV area s, A2C:    14.10 cm LV area s, A4C:    14.00 cm LV major d, A2C:   5.88 cm LV major d, A4C:   6.34 cm LV major s, A2C:   5.36 cm LV major s, A4C:   5.45 cm LV vol d, MOD A2C: 51.4 ml LV vol d, MOD A4C: 60.0 ml LV vol s, MOD A2C: 32.4 ml LV vol s, MOD A4C: 32.2 ml LV SV MOD A2C:     19.0 ml LV SV MOD A4C:     60.0 ml LV SV MOD BP:      25.1 ml RIGHT VENTRICLE TAPSE (M-mode): 1.1 cm LEFT ATRIUM             Index       RIGHT ATRIUM           Index LA diam:        4.00 cm 2.48 cm/m  RA Area:     10.99 cm LA Vol (A2C):   60.0 ml 37.26 ml/m RA Volume:   21.00 ml  13.04 ml/m LA Vol (A4C):   63.5 ml 39.44 ml/m LA Biplane Vol: 62.6 ml 38.88 ml/m  AORTIC VALVE LVOT Vmax:   73.30 cm/s LVOT Vmean:  48.800 cm/s LVOT VTI:    0.119 m  AORTA Ao Root diam: 2.70 cm TRICUSPID VALVE TR Peak grad:   26.8 mmHg TR Vmax:        259.00 cm/s  SHUNTS Systemic VTI:  0.12 m Systemic Diam: 1.70 cm  Rachelle HoraMihai Croitoru MD  Electronically signed by Thurmon Fair MD Signature Date/Time: 12/31/2019/10:19:25 AM    Final     Cardiac Studies   12/31/19  Echo:  IMPRESSIONS    1. Left ventricular ejection fraction, by visual estimation, is 60 to  65%. The left ventricle has normal function. There is no left ventricular hypertrophy.  2. Left ventricular diastolic function could not be evaluated.  3. The left ventricle has no regional wall motion abnormalities.  4. Global right ventricle has normal systolic function.The right ventricular size is normal. No increase in right ventricular wall thickness.  5. Left atrial size was severely dilated.  6. Right atrial size was normal.  7. The mitral valve is normal in structure. No evidence of mitral valve regurgitation.  8. The tricuspid valve is normal in structure.  9. The tricuspid valve is normal in structure. Tricuspid valve regurgitation is trivial. 10. The aortic valve is tricuspid. Aortic valve regurgitation is trivial. Mild aortic valve sclerosis without stenosis. 11. The pulmonic valve was grossly normal. Pulmonic valve regurgitation is not visualized. 12. Normal pulmonary artery systolic pressure. 13. The inferior vena cava is normal in size with greater than 50% respiratory variability, suggesting right atrial pressure of 3 mmHg.  FINDINGS  Left Ventricle: Left ventricular ejection fraction, by visual estimation, is 60 to 65%. The left ventricle has normal function. The left ventricle has no regional wall motion abnormalities. The left ventricular internal cavity size was the left  ventricle is normal in size. There is no left ventricular hypertrophy. The left ventricular diastology could not be evaluated due to atrial fibrillation. Left ventricular diastolic function could not be evaluated.  Right Ventricle: The right ventricular size is normal. No increase in right ventricular wall thickness. Global RV systolic function is has normal systolic function. The tricuspid regurgitant velocity is 2.59 m/s, and with an assumed right atrial pressure  of 3 mmHg, the estimated right ventricular systolic pressure is normal at 29.8 mmHg.  Left Atrium: Left atrial size was severely  dilated.  Right Atrium: Right atrial size was normal in size  Pericardium: There is no evidence of pericardial effusion.  Mitral Valve: The mitral valve is normal in structure. No evidence of mitral valve regurgitation.  Tricuspid Valve: The tricuspid valve is normal in structure. Tricuspid valve regurgitation is trivial.  Aortic Valve: The aortic valve is tricuspid. Aortic valve regurgitation is trivial. Mild aortic valve sclerosis is present, with no evidence of aortic valve stenosis.  Pulmonic Valve: The pulmonic valve was grossly normal. Pulmonic valve regurgitation is not visualized. Pulmonic regurgitation is not visualized.  Aorta: The aortic root is normal in size and structure.  Venous: The inferior vena cava is normal in size with greater than 50% respiratory variability, suggesting right atrial pressure of 3 mmHg.  IAS/Shunts: No atrial level shunt detected by color flow Doppler.    Patient Profile     86 y.o. female with a hx of HTN, dementia, recent COVID 19 infection/PNA , SVT, polymyalgia rheumatica admitted 12/31/19 with AMS, PNA  and found to be in new atrial fib and hypotension.    Assessment & Plan    New onset atrial fibrillation with RVR  HR was up to 140 and after IV fluid bolus placed on IV amiodarone.  Metoprolol was added.  On IV heparin.  (prior SVT with COVID admit) --HR improved at rest  --EF 60-65% with no pericardial effusion + LA severely dilated --add low dose BB and change IV amiodarone to po but will defer to  Dr. Flora Lipps.   PE with large burden of clot in Rt sided pulmonary arteries  HCAP this admit after recovery from COVID-19 PNA  Per IM  Metabolic encephalopathy with underlying dementia. Per IM  CAD 3 vessel on CTA of chest along with aneurysmal dilatation of both ascending aorta and descending  (4.9 cm and 4.3 cm )   Will need follow up as outpt  -CAD no angina with normal EF would probably not evaluate unless symptomatic.    HTN/hypotension improved controlled 126/68 to 117/91 currently off home meds of amlodipine 5 mg and toprol 25 mg.           For questions or updates, please contact [facility] Please consult [email or web address] for contact info under        Signed, Nada Boozer, NP  01/01/2020, 8:04 AM

## 2020-01-01 NOTE — Progress Notes (Signed)
PROGRESS NOTE    Shirley [name]  XLK:[id number] DOB: Mar 05, 1934 DOA: 12/30/2019 PCP: Dorena Cookey, MD (Inactive)   Brief Narrative:85 y.o.femalewith medical history significant ofhypertension, polymyalgia rheumatica, dementia, SVT presenting to the ED for evaluation of altered mental status. Patient was recently admitted to the hospital from 12/28-1/9 forCOVIDpneumoniaand HCAP. She was treated with remdesivir, steroids, and antibiotics. She was discharged to [facility] rehab facility, and day of admission she was discharged from the rehab facility to her ALF, patient presents from facility secondary to increased weakness, lethargy and altered mental status, she was noted to be in A. fib with RVR. Assessment & Plan:   Principal Problem:   HCAP (healthcare-associated pneumonia) Active Problems:   Acute encephalopathy   New onset atrial fibrillation ([place])   Pneumonia    #1HCAP-patient admitted to the hospital 1228 through January 9 of 2021 for Covid pneumonia and HCAP treated with steroids antibiotics and remdesivir.  Vanco stopped yesterday will DC cefepime today.  Procalcitonin normal.  #2 pulmonary embolism continue IV heparin  #3 new onset A. fib RVR on amiodarone drip.  Per cardiology.  Started on metoprolol tartrate 25 mg twice a day. Echo with normal ejection fraction  #4 acute metabolic encephalopathy likely secondary to medical illness in the setting of dementia. CT head-stable chronic changes remote cortically based left frontal lobe infarct diffuse parenchymal atrophy.  Chronic microvascular angiopathy TSH 1.39 B12  4445 Ammonia 31  #5 hypotension monitor closely on beta-blocker and amiodarone.     Nutrition Problem: Inadequate oral intake Etiology: dysphagia, lethargy/confusion     Signs/Symptoms: (chart review)    Interventions: Ensure Enlive (each supplement provides 350kcal and 20 grams of protein)  Estimated body mass index is 21.79 kg/m as  calculated from the following:   Height as of this encounter: '5\' 3"'  (1.6 m).   Weight as of this encounter: 55.8 kg.  DVT prophylaxis: Heparin Code Status: DNR  family Communication: None Disposition Plan: Patient came from assisted living facility, await PT evaluation.  Barriers to discharge is patient on IV heparin and IV amiodarone for rate control. Consultants:   Cardiology  Procedures: None  antimicrobials Subjective: Patient is resting in bed confused  Objective: Vitals:   01/01/20 0509 01/01/20 1009 01/01/20 1049 01/01/20 1119  BP: 125/77 100/73    Pulse: 94 (!) 31 (!) 120   Resp: 20 (!) 21  20  Temp: 98.1 F (36.7 C)     TempSrc: Oral     SpO2: 94%     Weight: 55.8 kg     Height:        Intake/Output Summary (Last 24 hours) at 01/01/2020 1631 Last data filed at 01/01/2020 1304 Gross per 24 hour  Intake 1003.12 ml  Output --  Net 1003.12 ml   Filed Weights   12/30/19 1957 01/01/20 0509  Weight: 59 kg 55.8 kg    Examination:  General exam: Appears calm and comfortable  Respiratory system: Clear to auscultation. Respiratory effort normal. Cardiovascular system: S1 & S2 heard, RRR. No JVD, murmurs, rubs, gallops or clicks. No pedal edema. Gastrointestinal system: Abdomen is nondistended, soft and nontender. No organomegaly or masses felt. Normal bowel sounds heard. Central nervous system: Alert and oriented. No focal neurological deficits. Extremities: Symmetric 5 x 5 power. Skin: No rashes, lesions or ulcers Psychiatry: Confused    Data Reviewed: I have personally reviewed following labs and imaging studies  CBC: Recent Labs  Lab 12/30/19 2031 01/01/20 0349  WBC 7.1 6.6  NEUTROABS 5.0  --  HGB 15.3* 14.1  HCT 47.4* 41.3  MCV 95.2 91.4  PLT 211 631   Basic Metabolic Panel: Recent Labs  Lab 12/30/19 2031 01/01/20 0349  NA 136 134*  K 3.7 3.5  CL 99 101  CO2 22 23  GLUCOSE 182* 122*  BUN 13 13  CREATININE 0.80 0.59  CALCIUM 9.0 8.7*    GFR: Estimated Creatinine Clearance: 44.1 mL/min (by C-G formula based on SCr of 0.59 mg/dL). Liver Function Tests: Recent Labs  Lab 12/30/19 2031 01/01/20 0349  AST 24 20  ALT 20 17  ALKPHOS 82 78  BILITOT 0.6 0.6  PROT 5.8* 5.6*  ALBUMIN 2.8* 2.6*   No results for input(s): LIPASE, AMYLASE in the last 168 hours. Recent Labs  Lab 12/31/19 0212  AMMONIA 31   Coagulation Profile: No results for input(s): INR, PROTIME in the last 168 hours. Cardiac Enzymes: No results for input(s): CKTOTAL, CKMB, CKMBINDEX, TROPONINI in the last 168 hours. BNP (last 3 results) No results for input(s): PROBNP in the last 8760 hours. HbA1C: No results for input(s): HGBA1C in the last 72 hours. CBG: No results for input(s): GLUCAP in the last 168 hours. Lipid Profile: No results for input(s): CHOL, HDL, LDLCALC, TRIG, CHOLHDL, LDLDIRECT in the last 72 hours. Thyroid Function Tests: Recent Labs    12/31/19 0212  TSH 1.397   Anemia Panel: Recent Labs    12/31/19 0212  VITAMINB12 4,445*  FERRITIN 177   Sepsis Labs: Recent Labs  Lab 12/30/19 2031 12/31/19 0212  PROCALCITON  --  <0.10  LATICACIDVEN 3.9* 2.0*    Recent Results (from the past 240 hour(s))  Culture, blood (routine x 2)     Status: None (Preliminary result)   Collection Time: 12/31/19  2:12 AM   Specimen: BLOOD LEFT HAND  Result Value Ref Range Status   Specimen Description BLOOD LEFT HAND  Final   Special Requests   Final    BOTTLES DRAWN AEROBIC AND ANAEROBIC Blood Culture adequate volume   Culture   Final    NO GROWTH 1 DAY Performed at [facility], [place] [address], [place], [place] [address]    Report Status PENDING  Incomplete  Respiratory Panel by PCR     Status: None   Collection Time: 12/31/19  2:12 AM   Specimen: Flu Kit Nasopharyngeal Swab; Respiratory  Result Value Ref Range Status   Adenovirus NOT DETECTED NOT DETECTED Final   Coronavirus 229E NOT DETECTED NOT DETECTED Final    Comment:  (NOTE) The Coronavirus on the Respiratory Panel, DOES NOT test for the novel  Coronavirus (2019 nCoV)    Coronavirus HKU1 NOT DETECTED NOT DETECTED Final   Coronavirus NL63 NOT DETECTED NOT DETECTED Final   Coronavirus OC43 NOT DETECTED NOT DETECTED Final   Metapneumovirus NOT DETECTED NOT DETECTED Final   Rhinovirus / Enterovirus NOT DETECTED NOT DETECTED Final   Influenza A NOT DETECTED NOT DETECTED Final   Influenza B NOT DETECTED NOT DETECTED Final   Parainfluenza Virus 1 NOT DETECTED NOT DETECTED Final   Parainfluenza Virus 2 NOT DETECTED NOT DETECTED Final   Parainfluenza Virus 3 NOT DETECTED NOT DETECTED Final   Parainfluenza Virus 4 NOT DETECTED NOT DETECTED Final   Respiratory Syncytial Virus NOT DETECTED NOT DETECTED Final   Bordetella pertussis NOT DETECTED NOT DETECTED Final   Chlamydophila pneumoniae NOT DETECTED NOT DETECTED Final   Mycoplasma pneumoniae NOT DETECTED NOT DETECTED Final    Comment: Performed at [facility], [address] [place], [place] [address]  SARS CORONAVIRUS 2 (TAT 6-24 HRS) Nasopharyngeal Flu Kit Nasopharyngeal Swab     Status: None   Collection Time: 12/31/19  2:12 AM   Specimen: Flu Kit Nasopharyngeal Swab  Result Value Ref Range Status   SARS Coronavirus 2 NEGATIVE NEGATIVE Final    Comment: (NOTE) SARS-CoV-2 target nucleic acids are NOT DETECTED. The SARS-CoV-2 RNA is generally detectable in upper and lower respiratory specimens during the acute phase of infection. Negative results do not preclude SARS-CoV-2 infection, do not rule out co-infections with other pathogens, and should not be used as the sole basis for treatment or other patient management decisions. Negative results must be combined with clinical observations, patient history, and epidemiological information. The expected result is Negative. Fact Sheet for Patients: SugarRoll.be Fact Sheet for Healthcare  Providers: https://www.woods-.com/ This test is not yet approved or cleared by the [place] FDA and  has been authorized for detection and/or diagnosis of SARS-CoV-2 by FDA under an Emergency Use Authorization (EUA). This EUA will remain  in effect (meaning this test can be used) for the duration of the COVID-19 declaration under Section 56 4(b)(1) of the Act, 21 U.S.C. section 360bbb-3(b)(1), unless the authorization is terminated or revoked sooner. Performed at [facility], [place] [address] [place], [place] [address]   Culture, blood (routine x 2)     Status: None (Preliminary result)   Collection Time: 12/31/19  3:00 AM   Specimen: BLOOD RIGHT HAND  Result Value Ref Range Status   Specimen Description BLOOD RIGHT HAND  Final   Special Requests   Final    BOTTLES DRAWN AEROBIC AND ANAEROBIC Blood Culture results may not be optimal due to an inadequate volume of blood received in culture bottles   Culture   Final    NO GROWTH 1 DAY Performed at [facility], [place] [address] [place], [place] [address]    Report Status PENDING  Incomplete         Radiology Studies: CT HEAD WO CONTRAST  Result Date: 12/31/2019 CLINICAL DATA:  Encephalopathy, history of hypertension EXAM: CT HEAD WITHOUT CONTRAST TECHNIQUE: Contiguous axial images were obtained from the base of the skull through the vertex without intravenous contrast. COMPARISON:  Perfusion study 11/23/2019, 11/24/2019 FINDINGS: Brain: Diffuse atrophy and ventricular dilatation is stable from prior comparisons. Notable atrophy of the mesial temporal lobes, also unchanged from prior. 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 No convincing evidence of acute infarction, hemorrhage, hydrocephalus, extra-axial collection or mass lesion/mass effect. Patchy areas of white matter hypoattenuation are most compatible with advanced chronic microvascular angiopathy. Vascular:  Atherosclerotic calcification of the carotid siphons and intradural vertebral arteries. No hyperdense vessel. Skull: No calvarial fracture or suspicious osseous lesion. No scalp swelling or hematoma. Sinuses/Orbits: Pneumatized secretions with air-fluid level in the left maxillary sinus. Background of mild bilateral maxillary mucosal thickening as well. Remaining paranasal sinuses and mastoids are predominantly clear. Orbital structures are unremarkable aside from prior lens extractions. Other: None IMPRESSION: 1. No acute intracranial abnormality. 2. Stable chronic changes including sequela of a remote cortically based left frontal lobe infarct, diffuse parenchymal atrophy including atrophy of the mesial temporal lobes and advanced chronic microvascular angiopathy. 3. Pneumatized secretions with air-fluid level in the left maxillary sinus. Correlate for signs of acute sinusitis. Electronically Signed   By: Lovena Le M.D.   On: 12/31/2019 04:58   CT ANGIO CHEST PE W OR WO CONTRAST  Result Date: 12/31/2019 CLINICAL DATA:  86 year old female with history of shortness of breath. COVID-19 infection. Evaluate for pulmonary embolism. EXAM: CT ANGIOGRAPHY CHEST WITH CONTRAST TECHNIQUE: Multidetector CT imaging of the chest was performed using the standard protocol during bolus administration of intravenous contrast. Multiplanar CT image reconstructions and MIPs were obtained to evaluate the vascular anatomy. CONTRAST:  166m OMNIPAQUE IOHEXOL 350 MG/ML SOLN COMPARISON:  Chest CT 07/04/2015. FINDINGS: Cardiovascular: Multiple filling defects are noted within the right pulmonary arteries including distal right main, lobar to right upper and lower lobes and segmental and subsegmental sized right upper lobe branches. The majority of these are nonocclusive, although there does appear to be some occlusive disease in the right upper lobe. No definite filling defects are noted within the left-sided pulmonary arterial tree.  Pulmonic trunk does not appear dilated. Mild cardiomegaly with biatrial dilatation, but no definitive findings to suggest right heart strain. There is aortic atherosclerosis, as well as atherosclerosis of the great vessels of the mediastinum and the coronary arteries, including calcified atherosclerotic plaque in the left main, left anterior descending, left circumflex and right coronary arteries. Aneurysmal dilatation of the ascending thoracic aorta (4.9 cm in diameter). Diffuse ectasia of the thoracic aorta with aneurysmal dilatation of the descending thoracic aorta which measures up to 4.3 cm in diameter at the level of the left atrium. Mediastinum/Nodes: No pathologically enlarged mediastinal or hilar lymph nodes. Large hiatal hernia. No axillary lymphadenopathy. Lungs/Pleura: Patchy multifocal ground-glass attenuation, consolidative airspace disease and areas of septal thickening and interstitial prominence scattered throughout the lungs bilaterally, asymmetrically distributed, most confluent in the right lower lobe, compatible with multilobar pneumonia in the setting of known COVID-19 infection. No pleural effusions. Upper Abdomen: Aortic atherosclerosis. Musculoskeletal: Multiple chronic appearing compression fractures of T2, T3, T4, T5, T8, T9 and T12, most severe at T12 where there is 90% loss of anterior vertebral body height and acute kyphotic deformity. Multiple old healed left-sided rib fractures. There are no aggressive appearing lytic or blastic lesions noted in the visualized portions of the skeleton. Review of the MIP images confirms the above findings. IMPRESSION: 1. Study is positive for pulmonary embolism with large burden of clot in the right-sided pulmonary arteries, as above. 2. Multilobar bilateral pneumonia compatible with reported COVID-19 infection. 3. Aortic atherosclerosis, in addition to left main and 3 vessel coronary artery disease. There is also aneurysmal dilatation of the ascending  thoracic aorta (4.9 cm in diameter) and descending thoracic aorta (4.3 cm in diameter). Recommend semi-annual imaging followup by CTA or MRA and referral to cardiothoracic surgery if not already obtained. This recommendation follows 2010 ACCF/AHA/AATS/ACR/ASA/SCA/SCAI/SIR/STS/SVM Guidelines for the Diagnosis and Management of Patients With Thoracic Aortic Disease. Circulation. 2010; 121:: L798-X211 Aortic aneurysm NOS (ICD10-I71.9). 4. Cardiomegaly with biatrial dilatation. 5. Additional incidental findings, as above. Critical Value/emergent results were called by telephone at the time of interpretation on 12/31/2019 at 2:52 pm to provider nurse KMaudie Mercuryin the ER, who verbally acknowledged these results. Aortic Atherosclerosis (ICD10-I70.0). Aortic aneurysm NOS (ICD10-I71.9). Electronically Signed   By: DVinnie LangtonM.D.   On: 12/31/2019 14:59   DG Chest Port 1 View  Result Date: 12/30/2019 CLINICAL DATA:  Shortness of breath and weakness. Recent COVID positive. EXAM: PORTABLE CHEST 1 VIEW COMPARISON:  12/07/2019 a prior radiographs FINDINGS: Mild cardiomegaly again noted. Bilateral LOWER lung airspace opacities are stable to slightly increased. No pleural effusion or pneumothorax. No acute bony abnormalities. RIGHT shoulder arthroplasty changes again identified. IMPRESSION: Stable to slightly increased bilateral LOWER lung  airspace opacities/pneumonia. Electronically Signed   By: Margarette Canada M.D.   On: 12/30/2019 21:06   ECHOCARDIOGRAM COMPLETE  Result Date: 12/31/2019   ECHOCARDIOGRAM REPORT   Patient Name:   Shirley [name] Date of Exam: 12/31/2019 Medical Rec #:  [id number]       Height:       63.0 in Accession #:    [id number]      Weight:       130.0 lb Date of Birth:  [birth year]       BSA:          1.61 m Patient Age:    31 years        BP:           109/76 mmHg Patient Gender: F               HR:           113 bpm. Exam Location:  Inpatient Procedure: 2D Echo, Cardiac Doppler and Color Doppler  Indications:    Atrial Fibrillation 427.31  History:        Patient has no prior history of Echocardiogram examinations.                 Risk Factors:Hypertension and Former Smoker.  Sonographer:    Paulita Fujita RDCS Referring Phys: [id number] [facility]  Sonographer Comments: Patient did not understand commands. IMPRESSIONS  1. Left ventricular ejection fraction, by visual estimation, is 60 to 65%. The left ventricle has normal function. There is no left ventricular hypertrophy.  2. Left ventricular diastolic function could not be evaluated.  3. The left ventricle has no regional wall motion abnormalities.  4. Global right ventricle has normal systolic function.The right ventricular size is normal. No increase in right ventricular wall thickness.  5. Left atrial size was severely dilated.  6. Right atrial size was normal.  7. The mitral valve is normal in structure. No evidence of mitral valve regurgitation.  8. The tricuspid valve is normal in structure.  9. The tricuspid valve is normal in structure. Tricuspid valve regurgitation is trivial. 10. The aortic valve is tricuspid. Aortic valve regurgitation is trivial. Mild aortic valve sclerosis without stenosis. 11. The pulmonic valve was grossly normal. Pulmonic valve regurgitation is not visualized. 12. Normal pulmonary artery systolic pressure. 13. The inferior vena cava is normal in size with greater than 50% respiratory variability, suggesting right atrial pressure of 3 mmHg. FINDINGS  Left Ventricle: Left ventricular ejection fraction, by visual estimation, is 60 to 65%. The left ventricle has normal function. The left ventricle has no regional wall motion abnormalities. The left ventricular internal cavity size was the left ventricle is normal in size. There is no left ventricular hypertrophy. The left ventricular diastology could not be evaluated due to atrial fibrillation. Left ventricular diastolic function could not be evaluated. Right Ventricle:  The right ventricular size is normal. No increase in right ventricular wall thickness. Global RV systolic function is has normal systolic function. The tricuspid regurgitant velocity is 2.59 m/s, and with an assumed right atrial pressure  of 3 mmHg, the estimated right ventricular systolic pressure is normal at 29.8 mmHg. Left Atrium: Left atrial size was severely dilated. Right Atrium: Right atrial size was normal in size Pericardium: There is no evidence of pericardial effusion. Mitral Valve: The mitral valve is normal in structure. No evidence of mitral valve regurgitation. Tricuspid Valve: The tricuspid valve is normal in structure. Tricuspid valve regurgitation is trivial. Aortic Valve: The  aortic valve is tricuspid. Aortic valve regurgitation is trivial. Mild aortic valve sclerosis is present, with no evidence of aortic valve stenosis. Pulmonic Valve: The pulmonic valve was grossly normal. Pulmonic valve regurgitation is not visualized. Pulmonic regurgitation is not visualized. Aorta: The aortic root is normal in size and structure. Venous: The inferior vena cava is normal in size with greater than 50% respiratory variability, suggesting right atrial pressure of 3 mmHg. IAS/Shunts: No atrial level shunt detected by color flow Doppler.  LEFT VENTRICLE PLAX 2D LVIDd:         4.10 cm LVIDs:         3.10 cm LV PW:         0.90 cm LV IVS:        0.90 cm LVOT diam:     1.70 cm LV SV:         36 ml LV SV Index:   22.34 LVOT Area:     2.27 cm  LV Volumes (MOD) LV area d, A2C:    18.90 cm LV area d, A4C:    21.30 cm LV area s, A2C:    14.10 cm LV area s, A4C:    14.00 cm LV major d, A2C:   5.88 cm LV major d, A4C:   6.34 cm LV major s, A2C:   5.36 cm LV major s, A4C:   5.45 cm LV vol d, MOD A2C: 51.4 ml LV vol d, MOD A4C: 60.0 ml LV vol s, MOD A2C: 32.4 ml LV vol s, MOD A4C: 32.2 ml LV SV MOD A2C:     19.0 ml LV SV MOD A4C:     60.0 ml LV SV MOD BP:      25.1 ml RIGHT VENTRICLE TAPSE (M-mode): 1.1 cm LEFT ATRIUM              Index       RIGHT ATRIUM           Index LA diam:        4.00 cm 2.48 cm/m  RA Area:     10.99 cm LA Vol (A2C):   60.0 ml 37.26 ml/m RA Volume:   21.00 ml  13.04 ml/m LA Vol (A4C):   63.5 ml 39.44 ml/m LA Biplane Vol: 62.6 ml 38.88 ml/m  AORTIC VALVE LVOT Vmax:   73.30 cm/s LVOT Vmean:  48.800 cm/s LVOT VTI:    0.119 m  AORTA Ao Root diam: 2.70 cm TRICUSPID VALVE TR Peak grad:   26.8 mmHg TR Vmax:        259.00 cm/s  SHUNTS Systemic VTI:  0.12 m Systemic Diam: 1.70 cm  Dani Gobble Croitoru MD Electronically signed by Sanda Klein MD Signature Date/Time: 12/31/2019/10:19:25 AM    Final         Scheduled Meds: . vitamin C  500 mg Oral Daily  . feeding supplement (ENSURE ENLIVE)  237 mL Oral BID BM  . metoprolol tartrate  25 mg Oral BID  . zinc sulfate  220 mg Oral Daily   Continuous Infusions: . amiodarone 30 mg/hr (01/01/20 1041)  . ceFEPime (MAXIPIME) IV 2 g (01/01/20 1043)  . heparin 800 Units/hr (01/01/20 0600)     LOS: 2 days     Georgette Shell, MD [facility]  If 7PM-7AM, please contact night-coverage [email or web address] Password [facility] 01/01/2020, 4:31 PM

## 2020-01-01 NOTE — Evaluation (Signed)
Clinical/Bedside Swallow Evaluation Patient Details  Name: Shirley [name] MRN: [id number] Date of Birth: 04-May-1934  Today's Date: 01/01/2020 Time: SLP Start Time (ACUTE ONLY): 0948 SLP Stop Time (ACUTE ONLY): 1000 SLP Time Calculation (min) (ACUTE ONLY): 12 min  Past Medical History:  Past Medical History:  Diagnosis Date  . Arthritis    "right shoulder; that's why I had to have it replaced"  . Hypertension   . Osteoarthritis   . Osteoporosis   . PMR (polymyalgia rheumatica) (HCC)    Past Surgical History:  Past Surgical History:  Procedure Laterality Date  . CATARACT EXTRACTION W/ INTRAOCULAR LENS  IMPLANT, BILATERAL     2008   . HIP PINNING,CANNULATED Right 10/20/2013   Procedure: CANNULATED HIP PINNING;  Surgeon: Eldred Manges, MD;  Location: [facility];  Service: Orthopedics;  Laterality: Right;  . KYPHOPLASTY N/A 02/28/2013   Procedure: KYPHOPLASTY;  Surgeon: Cristi Loron, MD;  Location: [facility];  Service: Neurosurgery;  Laterality: N/A;  Lumbar two Kyphoplasty  . POSTERIOR FUSION LUMBAR SPINE  03/28/12  . SHOULDER ARTHROSCOPY W/ ROTATOR CUFF REPAIR  ~ 01/2011   right  . TOTAL SHOULDER REPLACEMENT  06/2011   right  . VAGINAL HYSTERECTOMY  1970's   HPI:  Shirley [name] is a 86 y.o. female with medical history significant of hypertension, polymyalgia rheumatica, dementia, SVT presenting to the ED for evaluation of altered mental status. Patient was recently admitted to the hospital from 12/28-1/9 for COVID pneumonia and HCAP.  She was treated with remdesivir, steroids, and antibiotics.  She was discharged to [facility] rehab facility.  She was discharged from the rehab facility back to [facility] which is her assisted living facility.  Per staff there, patient arrived hypotensive with decreased responsiveness and was clammy.  Most recent BSE on 12/04/19 with recommendations for Dysphagia 1 (puree) solids and thin liquids.   Assessment / Plan / Recommendation Clinical  Impression  Pt was seen for a bedside swallow evaluation and she presents with oral dysphagia and suspected functional pharyngeal phase of the swallow.  Pt has a hx of dementia which may also contribute to her dysphagia.  She was oriented to herself, but not to place, situation, or year.  She consumed trials of ice chips, thin liquid, puree, and regular solids.  She exhibited good bolus acceptance with all trials and no clinical s/sx of aspiration were observed across any trials.  Observed prolonged mastication of ice chip and regular solid trial with trace oral residue following regular solids.  Cued pt for liquid wash to clear residue but she refused.  Additionally, AP transport was mildly prolonged with puree trials.  Recommend diet change to Dysphagia 2 (fine chop) solids and continuation of thin liquids with medications administered whole or crushed in puree.  Additionally, recommend full supervision to assist with self-feeding and to cue for the following compensatory strategies: 1) Small bites/sips 2) Slow rate of intake 3) Limit distractions 4) Sit upright 90 degrees 5) Alternate bites/sips.  SLP will briefly f/u to monitor diet tolerance      SLP Visit Diagnosis: Dysphagia, oral phase (R13.11)    Aspiration Risk  Mild aspiration risk    Diet Recommendation Dysphagia 2 (Fine chop);Thin liquid   Liquid Administration via: Cup;Straw Medication Administration: Whole meds with puree Supervision: Staff to assist with self feeding;Full supervision/cueing for compensatory strategies Compensations: Slow rate;Small sips/bites;Follow solids with liquid Postural Changes: Seated upright at 90 degrees    Other  Recommendations Oral Care Recommendations:  Oral care BID;Staff/trained caregiver to provide oral care   Follow up Recommendations Skilled Nursing facility      Frequency and Duration min 1 x/week  2 weeks       Prognosis Prognosis for Safe Diet Advancement: Fair Barriers to Reach Goals:  Cognitive deficits      Swallow Study   General HPI: Shirley [name] is a 86 y.o. female with medical history significant of hypertension, polymyalgia rheumatica, dementia, SVT presenting to the ED for evaluation of altered mental status. Patient was recently admitted to the hospital from 12/28-1/9 for COVID pneumonia and HCAP.  She was treated with remdesivir, steroids, and antibiotics.  She was discharged to [facility] rehab facility.  She was discharged from the rehab facility back to [facility] which is her assisted living facility.  Per staff there, patient arrived hypotensive with decreased responsiveness and was clammy.  Most recent BSE on 12/04/19 with recommendations for Dysphagia 1 (puree) solids and thin liquids. Type of Study: Bedside Swallow Evaluation Previous Swallow Assessment: See HPI Diet Prior to this Study: Regular;Thin liquids Temperature Spikes Noted: No Respiratory Status: Room air History of Recent Intubation: No Behavior/Cognition: Alert;Cooperative;Pleasant mood;Confused;Requires cueing Oral Cavity Assessment: Within Functional Limits Oral Care Completed by SLP: No Oral Cavity - Dentition: Poor condition Vision: Functional for self-feeding Patient Positioning: Upright in bed Baseline Vocal Quality: Normal Volitional Cough: Cognitively unable to elicit Volitional Swallow: Unable to elicit    Oral/Motor/Sensory Function Overall Oral Motor/Sensory Function: Within functional limits   Ice Chips Ice chips: Impaired Presentation: Spoon Oral Phase Impairments: Impaired mastication   Thin Liquid Thin Liquid: Within functional limits Presentation: Cup;Spoon;Straw    Nectar Thick Nectar Thick Liquid: Not tested   Honey Thick Honey Thick Liquid: Not tested   Puree Puree: Within functional limits Presentation: Spoon   Solid     Solid: Impaired Presentation: Spoon Oral Phase Impairments: Impaired mastication Oral Phase Functional Implications: Oral  residue;Impaired mastication;Prolonged oral transit     Colin Mulders M.S., CCC-SLP Acute Rehabilitation Services Office: [phone]   [place] 01/01/2020,10:18 AM

## 2020-01-01 NOTE — Progress Notes (Addendum)
ANTICOAGULATION CONSULT NOTE  Pharmacy Consult for heparin Indication: atrial fibrillation/PE  No Known Allergies  Patient Measurements: Height: 5\' 3"  (160 cm) Weight: 123 lb 0.3 oz (55.8 kg) IBW/kg (Calculated) : 52.4  Vital Signs: Temp: 98.1 F (36.7 C) (01/27 0509) Temp Source: Oral (01/27 0509) BP: 125/77 (01/27 0509) Pulse Rate: 94 (01/27 0509)  Labs: Recent Labs    12/30/19 2031 12/31/19 1720 01/01/20 0349  HGB 15.3*  --  14.1  HCT 47.4*  --  41.3  PLT 211  --  240  HEPARINUNFRC  --  0.77* 0.40  CREATININE 0.80  --  0.59    Estimated Creatinine Clearance: 44.1 mL/min (by C-G formula based on SCr of 0.59 mg/dL).   Medical History: Past Medical History:  Diagnosis Date  . Arthritis    "right shoulder; that's why I had to have it replaced"  . Hypertension   . Osteoarthritis   . Osteoporosis   . PMR (polymyalgia rheumatica) [facility])     Assessment: 86yo female admitted with HCAP, had been admitted a little less than a month ago with Covid-19, also found to be in Afib, no known h/o Afib. CTA showing PE. Pharmacy dosing heparin.  Heparin level this morning came back therapeutic at 0.4, on 800 units/hr. No s/sx of bleeding or infusion issues.  Goal of Therapy:  Heparin level 0.3-0.7 units/ml Monitor platelets by anticoagulation protocol: Yes   Plan:  -Continue heparin infusion at 800 units/hr -Heparin level and CBC in am -F/u plan for PO AC  86yo, PharmD, BCCCP Clinical Pharmacist  Phone: [phone]  Please check AMION for all [facility] Pharmacy phone numbers After 10:00 PM, call Main Pharmacy [phone]  ADDENDUM Heparin level came back therapeutic at 0.46, on 800 units/hr. CBC stable. No s/sx of bleeding or infusion issues.  [phone], PharmD, BCCCP Clinical Pharmacist

## 2020-01-02 LAB — CBC
HCT: 40.6 % (ref 36.0–46.0)
Hemoglobin: 13.6 g/dL (ref 12.0–15.0)
MCH: 30.7 pg (ref 26.0–34.0)
MCHC: 33.5 g/dL (ref 30.0–36.0)
MCV: 91.6 fL (ref 80.0–100.0)
Platelets: 224 10*3/uL (ref 150–400)
RBC: 4.43 MIL/uL (ref 3.87–5.11)
RDW: 15.8 % — ABNORMAL HIGH (ref 11.5–15.5)
WBC: 6.3 10*3/uL (ref 4.0–10.5)
nRBC: 0 % (ref 0.0–0.2)

## 2020-01-02 LAB — HEPARIN LEVEL (UNFRACTIONATED): Heparin Unfractionated: <0.1 IU/mL — ABNORMAL LOW (ref 0.30–0.70)

## 2020-01-02 MED ORDER — APIXABAN 5 MG PO TABS: 5.0000 mg | ORAL_TABLET | Freq: Two times a day (BID) | ORAL | Status: DC

## 2020-01-02 MED ORDER — HEPARIN BOLUS VIA INFUSION
2000.0000 [IU] | Freq: Once | INTRAVENOUS | Status: AC
  Administered 2020-01-02: 2000 [IU] via INTRAVENOUS
  Filled 2020-01-02: qty 2000

## 2020-01-02 MED ORDER — AMIODARONE HCL 200 MG PO TABS: 200.0000 mg | ORAL_TABLET | Freq: Every day | ORAL | Status: DC

## 2020-01-02 MED ORDER — METOPROLOL TARTRATE 50 MG PO TABS
50.0000 mg | ORAL_TABLET | Freq: Two times a day (BID) | ORAL | Status: DC
  Administered 2020-01-02 – 2020-01-03 (×3): 50 mg via ORAL
  Filled 2020-01-02 (×3): qty 1

## 2020-01-02 MED ORDER — AMIODARONE HCL 200 MG PO TABS
400.0000 mg | ORAL_TABLET | Freq: Two times a day (BID) | ORAL | Status: DC
  Administered 2020-01-02 – 2020-01-03 (×3): 400 mg via ORAL
  Filled 2020-01-02 (×3): qty 2

## 2020-01-02 MED ORDER — APIXABAN 5 MG PO TABS
10.0000 mg | ORAL_TABLET | Freq: Two times a day (BID) | ORAL | Status: DC
  Administered 2020-01-02 – 2020-01-03 (×3): 10 mg via ORAL
  Filled 2020-01-02 (×3): qty 2

## 2020-01-02 NOTE — Progress Notes (Signed)
ANTICOAGULATION CONSULT NOTE  Pharmacy Consult for heparin Indication: atrial fibrillation/PE  No Known Allergies  Patient Measurements: Height: 5\' 3"  (160 cm) Weight: 124 lb 1.9 oz (56.3 kg) IBW/kg (Calculated) : 52.4  Vital Signs: Temp: 97.8 F (36.6 C) (01/28 0423) Temp Source: Axillary (01/28 0423) BP: 120/66 (01/28 0948) Pulse Rate: 84 (01/28 0948)  Labs: Recent Labs    12/30/19 2031 12/31/19 1720 01/01/20 0349 01/01/20 1153 01/02/20 0243  HGB 15.3*  --  14.1  --  13.6  HCT 47.4*  --  41.3  --  40.6  PLT 211  --  240  --  224  HEPARINUNFRC  --    < > 0.40 0.46 <0.10*  CREATININE 0.80  --  0.59  --   --    < > = values in this interval not displayed.    Estimated Creatinine Clearance: 44.1 mL/min (by C-G formula based on SCr of 0.59 mg/dL).   Medical History: Past Medical History:  Diagnosis Date  . Arthritis    "right shoulder; that's why I had to have it replaced"  . Hypertension   . Osteoarthritis   . Osteoporosis   . PMR (polymyalgia rheumatica) [facility])     Assessment: 86yo female admitted with HCAP, had been admitted a little less than a month ago with Covid-19, also found to be in Afib, no known h/o Afib. CTA showing PE. Pharmacy dosing heparin.  Plan to transition to apixaban for PE/Afib. CBC stable. Scr 0.59. No s/sx of bleeding.  Goal of Therapy:  Heparin level 0.3-0.7 units/ml Monitor platelets by anticoagulation protocol: Yes   Plan:  -Discontinue heparin infusion -Given PE, will start apixaban 10 mg BID for 7 days then 5 mg BID thereafter -Monitor CBC and for s/sx of bleeding  86yo, PharmD, BCCCP Clinical Pharmacist  Phone: [phone]  Please check AMION for all [facility] Pharmacy phone numbers After 10:00 PM, call Main Pharmacy [phone]

## 2020-01-02 NOTE — Discharge Instructions (Signed)
Information on my medicine - ELIQUIS (apixaban)  Why was Eliquis prescribed for you? Eliquis was prescribed to treat blood clots that may have been found in the veins of your legs (deep vein thrombosis) or in your lungs (pulmonary embolism) and to reduce the risk of them occurring again.  What do You need to know about Eliquis ? The starting dose is 10 mg (two 5 mg tablets) taken TWICE daily for the FIRST SEVEN (7) DAYS, then on 01/09/2020  the dose is reduced to ONE 5 mg tablet taken TWICE daily.  Eliquis may be taken with or without food.   Try to take the dose about the same time in the morning and in the evening. If you have difficulty swallowing the tablet whole please discuss with your pharmacist how to take the medication safely.  Take Eliquis exactly as prescribed and DO NOT stop taking Eliquis without talking to the doctor who prescribed the medication.  Stopping may increase your risk of developing a new blood clot.  Refill your prescription before you run out.  After discharge, you should have regular check-up appointments with your healthcare provider that is prescribing your Eliquis.    What do you do if you miss a dose? If a dose of ELIQUIS is not taken at the scheduled time, take it as soon as possible on the same day and twice-daily administration should be resumed. The dose should not be doubled to make up for a missed dose.  Important Safety Information A possible side effect of Eliquis is bleeding. You should call your healthcare provider right away if you experience any of the following: ? Bleeding from an injury or your nose that does not stop. ? Unusual colored urine (red or dark brown) or unusual colored stools (red or black). ? Unusual bruising for unknown reasons. ? A serious fall or if you hit your head (even if there is no bleeding).  Some medicines may interact with Eliquis and might increase your risk of bleeding or clotting while on Eliquis. To help  avoid this, consult your healthcare provider or pharmacist prior to using any new prescription or non-prescription medications, including herbals, vitamins, non-steroidal anti-inflammatory drugs (NSAIDs) and supplements.  This website has more information on Eliquis (apixaban): http://www.eliquis.com/eliquis/home

## 2020-01-02 NOTE — Evaluation (Signed)
Physical Therapy Evaluation Patient Details Name: Shirley [name] MRN: [id number] DOB: 07-23-1934 Today's Date: 01/02/2020   History of Present Illness  86yo female presenting with AMS. Note recent hospitalization for Covid 12/02/19-12/14/19. She had recently returned to her [facility]F but was found to be hypotensive with reduced responsiveness and clammy, sent to the ED. ADmitted for HCAP, new onset A-fib, and acute metabolic encephalopathy, also found to have acute PE. PMH HTN, PMR, R hip pinning, kyphoplasty, R total shoulder replacement, lumbar fusion, Covid 19, and dementia  Clinical Impression   Spoke with MD about heparin level being low with acute PE this morning, MD advises to continue with eval. Patient received in bed, lethargic and quite confused, oriented to self only and attempts to reorient unsuccessful due to poor cognition. See below for mobility levels. She has very poor sequencing and required MaxA to initiate any movement today, often with nonsensical responses. Sat at EOB for exactly one second before she said "I need to lay down" and laid herself back in the bed, resistant to other attempts at mobility. VSS. Positioned to comfort with all needs met, bed alarm active and mitts in place. Will need SNF unless her ALF can provide appropriate levels of assist.     Follow Up Recommendations SNF;Supervision/Assistance - 24 hour(unless ALF can provide appropriate levels of assist)    Equipment Recommendations  Other (comment)(defer)    Recommendations for Other Services       Precautions / Restrictions Precautions Precautions: Fall Precaution Comments: cognition pt very confused Restrictions Weight Bearing Restrictions: No      Mobility  Bed Mobility Overal bed mobility: Needs Assistance Bed Mobility: Supine to Sit;Sit to Supine     Supine to sit: Max assist Sit to supine: Min assist   General bed mobility comments: MaxA and heavy cues to get to EOB, very poor  sequencing. Sat at EOB for literally one second before patient said "I have to lay back down" and laid herself down on bed, did require MinA for LE management  Transfers                 General transfer comment: refused  Ambulation/Gait             General Gait Details: refused  Stairs            Wheelchair Mobility    Modified Rankin (Stroke Patients Only)       Balance Overall balance assessment: Needs assistance Sitting-balance support: Feet unsupported;Bilateral upper extremity supported Sitting balance-Leahy Scale: Poor Sitting balance - Comments: MaxA for upright, laid herself back down after sitting at EOB for literally one second                                     Pertinent Vitals/Pain Pain Assessment: Faces Pain Score: 0-No pain Faces Pain Scale: No hurt Pain Intervention(s): Monitored during session;Limited activity within patient's tolerance    Home Living Family/patient expects to be discharged to:: Unsure                 Additional Comments: from a memory care unit at [facility]; pt unable to provide details on prior levels, family not available    Prior Function Level of Independence: Needs assistance   Gait / Transfers Assistance Needed: as per chart prior to last admission pt was ambulating with RW  ADL's / Homemaking Assistance Needed: anticipate needing  assist for ADLs, pt unable to report PLOF  Comments: all information taken from prior charting     Hand Dominance   Dominant Hand: Right    Extremity/Trunk Assessment   Upper Extremity Assessment Upper Extremity Assessment: Generalized weakness    Lower Extremity Assessment Lower Extremity Assessment: Generalized weakness    Cervical / Trunk Assessment Cervical / Trunk Assessment: Kyphotic  Communication   Communication: Receptive difficulties  Cognition Arousal/Alertness: Lethargic Behavior During Therapy: Flat affect Overall Cognitive  Status: History of cognitive impairments - at baseline                                 General Comments: A&O to self only, states "I don't know" or "I can't help you", even "maybe we could" to all other questions. Often tangential and with nonsensical responses. Reduced initiation and basically no sequencing.      General Comments General comments (skin integrity, edema, etc.): VSS on RA    Exercises     Assessment/Plan    PT Assessment Patient needs continued PT services  PT Problem List Decreased strength;Decreased activity tolerance;Decreased balance;Decreased mobility;Decreased coordination;Decreased cognition;Decreased knowledge of use of DME;Decreased safety awareness       PT Treatment Interventions DME instruction;Balance training;Gait training;Functional mobility training;Therapeutic activities;Patient/family education;Therapeutic exercise    PT Goals (Current goals can be found in the Care Plan section)  Acute Rehab PT Goals PT Goal Formulation: Patient unable to participate in goal setting Time For Goal Achievement: 01/16/20 Potential to Achieve Goals: Fair    Frequency Min 2X/week   Barriers to discharge        Co-evaluation               AM-PAC PT "6 Clicks" Mobility  Outcome Measure Help needed turning from your back to your side while in a flat bed without using bedrails?: A Lot Help needed moving from lying on your back to sitting on the side of a flat bed without using bedrails?: A Lot Help needed moving to and from a bed to a chair (including a wheelchair)?: Total Help needed standing up from a chair using your arms (e.g., wheelchair or bedside chair)?: Total Help needed to walk in hospital room?: Total Help needed climbing 3-5 steps with a railing? : Total 6 Click Score: 8    End of Session   Activity Tolerance: Other (comment)(limited by cognition and participation) Patient left: in bed;with call bell/phone within reach;with bed  alarm set   PT Visit Diagnosis: Other abnormalities of gait and mobility (R26.89);Muscle weakness (generalized) (M62.81)    Time: 1100-1116 PT Time Calculation (min) (ACUTE ONLY): 16 min   Charges:   PT Evaluation $PT Eval Moderate Complexity: 1 Mod          Windell Norfolk, DPT, PN1   Supplemental Physical Therapist [place]    Pager [phone] Acute Rehab Office [phone]

## 2020-01-02 NOTE — NC FL2 (Signed)
[place] MEDICAID FL2 LEVEL OF CARE SCREENING TOOL     IDENTIFICATION  Patient Name: Shirley [name] Birthdate: [birth year] Sex: female Admission Date (Current Location): 12/30/2019  [facility] and [place] Number:  Herbalist and Address:  [facility] [place] [address] [place], [place] [address]      Provider Number: [id number]  Attending Physician Name and Address:  Georgette Shell, MD  Relative Name and Phone Number:  Bettie Capistran - son; [phone]    Current Level of Care: Hospital Recommended Level of Care: [place] Prior Approval Number:    Date Approved/Denied:   PASRR Number: [id number]  Discharge Plan: SNF    Current Diagnoses: Patient Active Problem List   Diagnosis Date Noted  . New onset atrial fibrillation ([place]) 12/31/2019  . Pneumonia 12/31/2019  . Dementia with behavioral disturbance ([place])   . Pneumonia due to COVID-19 virus 12/03/2019  . Hypernatremia   . HCAP (healthcare-associated pneumonia) 12/02/2019  . Palliative care by specialist   . Goals of care, counseling/discussion   . DNR (do not resuscitate)   . Acute encephalopathy 11/23/2019  . Ascending aortic aneurysm ([place]) 11/23/2019  . Moderate dementia ([place]) 08/10/2015  . Frequent falls 05/08/2015  . Anxiety 05/08/2015  . Anxiety state, unspecified 03/06/2014  . Short-term memory loss 12/30/2013  . Lumbar degenerative disc disease 08/22/2011  . Polymyalgia rheumatica ([place]) 05/16/2008  . Essential hypertension 03/12/2008  . Arthritis, senescent 03/12/2008  . MYALGIA 03/12/2008  . OSTEOPOROSIS 03/12/2008    Orientation RESPIRATION BLADDER Height & Weight     Self  Normal Incontinent, External catheter(External urinary catheter placed 12/30/19) Weight: 124 lb 1.9 oz (56.3 kg) Height:  5\' 3"  (160 cm)  BEHAVIORAL SYMPTOMS/MOOD NEUROLOGICAL BOWEL NUTRITION STATUS      Incontinent Diet(DYS 2 - fine chopped, thin liquids)  AMBULATORY STATUS  COMMUNICATION OF NEEDS Skin   Extensive Assist(Patient was resistant to ambulating today with therapist) Verbally Other (Comment)(Ecchymosis arm, buttocks (right, left posterior) treated with barrier cream; Skin tear-right/left back-treated with foam dressing; MASD buttocks; Skin tears left/right buttocks and elbow)                       Personal Care Assistance Level of Assistance  Bathing, Feeding, Dressing Bathing Assistance: Maximum assistance Feeding assistance: Independent Dressing Assistance: Maximum assistance     Functional Limitations Info  Sight, Hearing, Speech Sight Info: Impaired Hearing Info: Impaired Speech Info: Adequate    SPECIAL CARE FACTORS FREQUENCY  PT (By licensed PT), OT (By licensed OT), Speech therapy     PT Frequency: Evaluated at hospital 1/28. PT eval and treat at SNF, a minimum of 5 days per week OT Frequency: OT order placed - Eval pending.     Speech Therapy Frequency: Evaluated 1/27. DYS 2 - fine chop - thin liquids recommended      Contractures Contractures Info: Not present    Additional Factors Info  Code Status, Allergies Code Status Info: DNR Allergies Info: No known allergies           Current Medications (01/02/2020):  This is the current hospital active medication list Current Facility-Administered Medications  Medication Dose Route Frequency Provider Last Rate Last Admin  . acetaminophen (TYLENOL) tablet 650 mg  650 mg Oral Q6H PRN Shela Leff, MD      . albuterol (VENTOLIN HFA) 108 (90 Base) MCG/ACT inhaler 2 puff  2 puff Inhalation Q4H PRN Shela Leff, MD      .  amiodarone (PACERONE) tablet 400 mg  400 mg Oral BID Sande Rives, MD   400 mg at 01/02/20 0950   Followed by  . [START ON 01/09/2020] amiodarone (PACERONE) tablet 200 mg  200 mg Oral Daily O'Neal, Ronnald Ramp, MD      . apixaban Everlene Balls) tablet 10 mg  10 mg Oral BID Alwyn Ren, MD   10 mg at 01/02/20 1122   Followed by  .  [START ON 01/09/2020] apixaban (ELIQUIS) tablet 5 mg  5 mg Oral BID Alwyn Ren, MD      . ascorbic acid (VITAMIN C) tablet 500 mg  500 mg Oral Daily John Giovanni, MD   500 mg at 01/02/20 0948  . chlorpheniramine-HYDROcodone (TUSSIONEX) 10-8 MG/5ML suspension 5 mL  5 mL Oral Q12H PRN John Giovanni, MD      . feeding supplement (ENSURE ENLIVE) (ENSURE ENLIVE) liquid 237 mL  237 mL Oral BID BM Alwyn Ren, MD   237 mL at 01/02/20 0950  . guaiFENesin-dextromethorphan (ROBITUSSIN DM) 100-10 MG/5ML syrup 10 mL  10 mL Oral Q4H PRN John Giovanni, MD      . metoprolol tartrate (LOPRESSOR) tablet 50 mg  50 mg Oral BID Sande Rives, MD   50 mg at 01/02/20 0948  . zinc sulfate capsule 220 mg  220 mg Oral Daily John Giovanni, MD   220 mg at 01/02/20 4431     Discharge Medications: Please see discharge summary for a list of discharge medications.  Relevant Imaging Results:  Relevant Lab Results:   Additional Information ss#[id number]  Cristobal Goldmann, LCSW

## 2020-01-02 NOTE — TOC Initial Note (Signed)
Transition of Care [facility]) - Initial/Assessment Note    Patient Details  Name: Shirley [name] MRN: [id number] Date of Birth: 21-Oct-1934  Transition of Care [facility]) CM/SW Contact:    Sable Feil, LCSW Phone Number: 01/02/2020, 4:37 PM  Clinical Narrative: Talked with son Sherren Mocha by phone regarding his mother's discharge disposition and recommendation of ST rehab. Son explained that his mother had discharged back to [facility] on 1//25 and had to return to the hospital same date. Mr. Gadsby indicated that he would like his mother to return to [facility] if they can take her. Son reported that his mom had good success there with rehab a year or so ago, however this last time, she was real weak. Son expressed understanding that his mother needs more rehab before returning to [facility].                  Expected Discharge Plan: Skilled Nursing Facility Barriers to Discharge: Insurance Authorization, Other (comment), SNF Pending bed offer(Facility search initiated today)   Patient Goals and CMS Choice Patient states their goals for this hospitalization and ongoing recovery are:: Son would like patient to return to rehab for strengthening before returning to [facility] CMS Medicare.gov Compare Post Acute Care list provided to:: Patient Represenative (must comment)(Son informed re: Medicare.gov and provided CSW with facility preference) Choice offered to / list presented to : Adult Children(Son advised of choice re: facility selection and would like his mother to return to [facility])  Expected Discharge Plan and Services Expected Discharge Plan: [place]   Discharge Planning Services: Other - See comment(SNF)   Living arrangements for the past 2 months: [facility], Assisted Living Facility(Patient from [place] and has been to [facility] for [place] rehab)                 DME Arranged: N/A DME Agency: NA       HH  Arranged: NA          Prior Living Arrangements/Services Living arrangements for the past 2 months: [facility], Assisted Living Facility(Patient from [place] and has been to [facility] for [place] rehab) Lives with:: Facility Resident([facility]) Patient language and need for interpreter reviewed:: No Do you feel safe going back to the place where you live?: Yes(Son feels safe with patient being at the ALF, but understands that she needs more rehab before returning home)      Need for Family Participation in Patient Care: No (Comment) Care giver support system in place?: Yes (comment)   Criminal Activity/Legal Involvement Pertinent to Current Situation/Hospitalization: No - Comment as needed  Activities of Daily Living Home Assistive Devices/Equipment: Bedside commode/3-in-1 ADL Screening (condition at time of admission) Patient's cognitive ability adequate to safely complete daily activities?: No Is the patient deaf or have difficulty hearing?: No Does the patient have difficulty seeing, even when wearing glasses/contacts?: No Does the patient have difficulty concentrating, remembering, or making decisions?: Yes Patient able to express need for assistance with ADLs?: No Does the patient have difficulty dressing or bathing?: Yes Independently performs ADLs?: No Communication: Dependent Is this a change from baseline?: Pre-admission baseline Dressing (OT): Dependent Is this a change from baseline?: Pre-admission baseline Grooming: Dependent Is this a change from baseline?: Pre-admission baseline Feeding: Dependent Is this a change from baseline?: Pre-admission baseline Bathing: Dependent Is this a change from baseline?: Pre-admission baseline Toileting: Dependent Is this a change from baseline?: Pre-admission  baseline In/Out Bed: Dependent Is this a change from baseline?: Pre-admission baseline Walks in Home: Dependent Is this a change from  baseline?: Pre-admission baseline Does the patient have difficulty walking or climbing stairs?: Yes Weakness of Legs: Both Weakness of Arms/Hands: Both  Permission Sought/Granted Permission sought to share information with : Other (comment)(Did not talk with patient as only oriented to self. Contacted son.) Permission granted to share information with : No(Patient oriented to self only. Talked with son Tawanna Cooler)              Emotional Assessment Appearance:: Other (Comment Required(Did not visit with patient. Talked with son by phone) Attitude/Demeanor/Rapport: Unable to Assess Affect (typically observed): Unable to Assess Orientation: : Oriented to Self Alcohol / Substance Use: Tobacco Use, Alcohol Use, Illicit Drugs(Per H&P, the patient quit smoking and does not drink or use illicit drugs) Psych Involvement: Yes (comment)  Admission diagnosis:  Pneumonia [J18.9] HCAP (healthcare-associated pneumonia) [J18.9] Altered mental status, unspecified altered mental status type [R41.82] COVID-19 virus infection [U07.1] Patient Active Problem List   Diagnosis Date Noted  . New onset atrial fibrillation (HCC) 12/31/2019  . Pneumonia 12/31/2019  . Dementia with behavioral disturbance (HCC)   . Pneumonia due to COVID-19 virus 12/03/2019  . Hypernatremia   . HCAP (healthcare-associated pneumonia) 12/02/2019  . Palliative care by specialist   . Goals of care, counseling/discussion   . DNR (do not resuscitate)   . Acute encephalopathy 11/23/2019  . Ascending aortic aneurysm (HCC) 11/23/2019  . Moderate dementia (HCC) 08/10/2015  . Frequent falls 05/08/2015  . Anxiety 05/08/2015  . Anxiety state, unspecified 03/06/2014  . Short-term memory loss 12/30/2013  . Lumbar degenerative disc disease 08/22/2011  . Polymyalgia rheumatica (HCC) 05/16/2008  . Essential hypertension 03/12/2008  . Arthritis, senescent 03/12/2008  . MYALGIA 03/12/2008  . OSTEOPOROSIS 03/12/2008   PCP:  Roderick Pee, MD (Inactive) Pharmacy:  No Pharmacies Listed   Social Determinants of Health (SDOH) Interventions  No SDOH interventions needed at this time.  Readmission Risk Interventions Readmission Risk Prevention Plan 12/10/2019  Transportation Screening Complete  Home Care Screening Not Complete  Home Care Screening Not Completed Comments Returning to SNF  Medication Review (RN CM) Complete  Some recent data might be hidden

## 2020-01-02 NOTE — Progress Notes (Signed)
Cardiology Progress Note  Patient ID: Shirley [name] MRN: [id number] DOB: 1934/10/06 Date of Encounter: 01/02/2020  Primary Cardiologist: Reatha Harps, MD  Subjective  Remains in atrial fibrillation with heart rate in the 80-100 range.  No complaints this morning.  No chest pain or shortness of breath.  ROS:  All other ROS reviewed and negative. Pertinent positives noted in the HPI.     Inpatient Medications  Scheduled Meds: . amiodarone  400 mg Oral BID   Followed by  . [START ON 01/09/2020] amiodarone  200 mg Oral Daily  . vitamin C  500 mg Oral Daily  . feeding supplement (ENSURE ENLIVE)  237 mL Oral BID BM  . metoprolol tartrate  50 mg Oral BID  . zinc sulfate  220 mg Oral Daily   Continuous Infusions: . heparin 900 Units/hr (01/02/20 0554)   PRN Meds: acetaminophen, albuterol, chlorpheniramine-HYDROcodone, guaiFENesin-dextromethorphan   Vital Signs   Vitals:   01/01/20 2000 01/01/20 2044 01/02/20 0423 01/02/20 0741  BP:  104/69 (!) 128/95   Pulse: 78 97 60 69  Resp: 20 (!) 22 20 10   Temp:  98.8 F (37.1 C) 97.8 F (36.6 C)   TempSrc:  Axillary Axillary   SpO2: 93% 92% 96% 96%  Weight:   56.3 kg   Height:        Intake/Output Summary (Last 24 hours) at 01/02/2020 0855 Last data filed at 01/02/2020 0829 Gross per 24 hour  Intake 1240.14 ml  Output 650 ml  Net 590.14 ml   Last 3 Weights 01/02/2020 01/01/2020 12/30/2019  Weight (lbs) 124 lb 1.9 oz 123 lb 0.3 oz 130 lb  Weight (kg) 56.3 kg 55.8 kg 58.968 kg      Telemetry  Overnight telemetry shows atrial fibrillation with heart rate in the 80-100 range, which I personally reviewed.   ECG  The most recent ECG shows atrial fibrillation, heart rate 70, nonspecific ST-T changes noted, which I personally reviewed.   Physical Exam   Vitals:   01/01/20 2000 01/01/20 2044 01/02/20 0423 01/02/20 0741  BP:  104/69 (!) 128/95   Pulse: 78 97 60 69  Resp: 20 (!) 22 20 10   Temp:  98.8 F (37.1 C) 97.8 F (36.6  C)   TempSrc:  Axillary Axillary   SpO2: 93% 92% 96% 96%  Weight:   56.3 kg   Height:         Intake/Output Summary (Last 24 hours) at 01/02/2020 0855 Last data filed at 01/02/2020 0829 Gross per 24 hour  Intake 1240.14 ml  Output 650 ml  Net 590.14 ml    Last 3 Weights 01/02/2020 01/01/2020 12/30/2019  Weight (lbs) 124 lb 1.9 oz 123 lb 0.3 oz 130 lb  Weight (kg) 56.3 kg 55.8 kg 58.968 kg    Body mass index is 21.99 kg/m.  General: Well nourished, well developed, in no acute distress Head: Atraumatic, normal size  Eyes: PEERLA, EOMI  Neck: Supple, no JVD Endocrine: No thryomegaly Cardiac: Irregular rhythm, no murmurs rubs or gallops Lungs: Clear to auscultation bilaterally, no wheezing, rhonchi or rales  Abd: Soft, nontender, no hepatomegaly  Ext: No edema, pulses 2+ Musculoskeletal: No deformities, BUE and BLE strength normal and equal Skin: Warm and dry, no rashes   Neuro: Alert and oriented to place only Psych: Normal mood and affect   Labs  High Sensitivity Troponin:  No results for input(s): TROPONINIHS in the last 720 hours.   Cardiac EnzymesNo results for input(s): TROPONINI in the last  168 hours. No results for input(s): TROPIPOC in the last 168 hours.  Chemistry Recent Labs  Lab 12/30/19 2031 01/01/20 0349  NA 136 134*  K 3.7 3.5  CL 99 101  CO2 22 23  GLUCOSE 182* 122*  BUN 13 13  CREATININE 0.80 0.59  CALCIUM 9.0 8.7*  PROT 5.8* 5.6*  ALBUMIN 2.8* 2.6*  AST 24 20  ALT 20 17  ALKPHOS 82 78  BILITOT 0.6 0.6  GFRNONAA >60 >60  GFRAA >60 >60  ANIONGAP 15 10    Hematology Recent Labs  Lab 12/30/19 2031 01/01/20 0349 01/02/20 0243  WBC 7.1 6.6 6.3  RBC 4.98 4.52 4.43  HGB 15.3* 14.1 13.6  HCT 47.4* 41.3 40.6  MCV 95.2 91.4 91.6  MCH 30.7 31.2 30.7  MCHC 32.3 34.1 33.5  RDW 15.6* 15.8* 15.8*  PLT 211 240 224   BNPNo results for input(s): BNP, PROBNP in the last 168 hours.  DDimer  Recent Labs  Lab 12/31/19 0212  DDIMER 5.37*      Radiology  CT ANGIO CHEST PE W OR WO CONTRAST  Result Date: 12/31/2019 CLINICAL DATA:  86 year old female with history of shortness of breath. COVID-19 infection. Evaluate for pulmonary embolism. EXAM: CT ANGIOGRAPHY CHEST WITH CONTRAST TECHNIQUE: Multidetector CT imaging of the chest was performed using the standard protocol during bolus administration of intravenous contrast. Multiplanar CT image reconstructions and MIPs were obtained to evaluate the vascular anatomy. CONTRAST:  165mL OMNIPAQUE IOHEXOL 350 MG/ML SOLN COMPARISON:  Chest CT 07/04/2015. FINDINGS: Cardiovascular: Multiple filling defects are noted within the right pulmonary arteries including distal right main, lobar to right upper and lower lobes and segmental and subsegmental sized right upper lobe branches. The majority of these are nonocclusive, although there does appear to be some occlusive disease in the right upper lobe. No definite filling defects are noted within the left-sided pulmonary arterial tree. Pulmonic trunk does not appear dilated. Mild cardiomegaly with biatrial dilatation, but no definitive findings to suggest right heart strain. There is aortic atherosclerosis, as well as atherosclerosis of the great vessels of the mediastinum and the coronary arteries, including calcified atherosclerotic plaque in the left main, left anterior descending, left circumflex and right coronary arteries. Aneurysmal dilatation of the ascending thoracic aorta (4.9 cm in diameter). Diffuse ectasia of the thoracic aorta with aneurysmal dilatation of the descending thoracic aorta which measures up to 4.3 cm in diameter at the level of the left atrium. Mediastinum/Nodes: No pathologically enlarged mediastinal or hilar lymph nodes. Large hiatal hernia. No axillary lymphadenopathy. Lungs/Pleura: Patchy multifocal ground-glass attenuation, consolidative airspace disease and areas of septal thickening and interstitial prominence scattered throughout the  lungs bilaterally, asymmetrically distributed, most confluent in the right lower lobe, compatible with multilobar pneumonia in the setting of known COVID-19 infection. No pleural effusions. Upper Abdomen: Aortic atherosclerosis. Musculoskeletal: Multiple chronic appearing compression fractures of T2, T3, T4, T5, T8, T9 and T12, most severe at T12 where there is 90% loss of anterior vertebral body height and acute kyphotic deformity. Multiple old healed left-sided rib fractures. There are no aggressive appearing lytic or blastic lesions noted in the visualized portions of the skeleton. Review of the MIP images confirms the above findings. IMPRESSION: 1. Study is positive for pulmonary embolism with large burden of clot in the right-sided pulmonary arteries, as above. 2. Multilobar bilateral pneumonia compatible with reported COVID-19 infection. 3. Aortic atherosclerosis, in addition to left main and 3 vessel coronary artery disease. There is also aneurysmal dilatation of the  ascending thoracic aorta (4.9 cm in diameter) and descending thoracic aorta (4.3 cm in diameter). Recommend semi-annual imaging followup by CTA or MRA and referral to cardiothoracic surgery if not already obtained. This recommendation follows 2010 ACCF/AHA/AATS/ACR/ASA/SCA/SCAI/SIR/STS/SVM Guidelines for the Diagnosis and Management of Patients With Thoracic Aortic Disease. Circulation. 2010; 121: W098-J191: E266-e369. Aortic aneurysm NOS (ICD10-I71.9). 4. Cardiomegaly with biatrial dilatation. 5. Additional incidental findings, as above. Critical Value/emergent results were called by telephone at the time of interpretation on 12/31/2019 at 2:52 pm to provider nurse Selena BattenKim in the ER, who verbally acknowledged these results. Aortic Atherosclerosis (ICD10-I70.0). Aortic aneurysm NOS (ICD10-I71.9). Electronically Signed   By: Trudie Reedaniel  Entrikin M.D.   On: 12/31/2019 14:59   ECHOCARDIOGRAM COMPLETE  Result Date: 12/31/2019   ECHOCARDIOGRAM REPORT   Patient Name:    Shirley [name] Date of Exam: 12/31/2019 Medical Rec #:  [id number]       Height:       63.0 in Accession #:    [id number]      Weight:       130.0 lb Date of Birth:  1934-05-15       BSA:          1.61 m Patient Age:    85 years        BP:           109/76 mmHg Patient Gender: F               HR:           113 bpm. Exam Location:  Inpatient Procedure: 2D Echo, Cardiac Doppler and Color Doppler Indications:    Atrial Fibrillation 427.31  History:        Patient has no prior history of Echocardiogram examinations.                 Risk Factors:Hypertension and Former Smoker.  Sonographer:    Tonia GhentJulia Underwood RDCS Referring Phys: [id number] [facility] RATHORE  Sonographer Comments: Patient did not understand commands. IMPRESSIONS  1. Left ventricular ejection fraction, by visual estimation, is 60 to 65%. The left ventricle has normal function. There is no left ventricular hypertrophy.  2. Left ventricular diastolic function could not be evaluated.  3. The left ventricle has no regional wall motion abnormalities.  4. Global right ventricle has normal systolic function.The right ventricular size is normal. No increase in right ventricular wall thickness.  5. Left atrial size was severely dilated.  6. Right atrial size was normal.  7. The mitral valve is normal in structure. No evidence of mitral valve regurgitation.  8. The tricuspid valve is normal in structure.  9. The tricuspid valve is normal in structure. Tricuspid valve regurgitation is trivial. 10. The aortic valve is tricuspid. Aortic valve regurgitation is trivial. Mild aortic valve sclerosis without stenosis. 11. The pulmonic valve was grossly normal. Pulmonic valve regurgitation is not visualized. 12. Normal pulmonary artery systolic pressure. 13. The inferior vena cava is normal in size with greater than 50% respiratory variability, suggesting right atrial pressure of 3 mmHg. FINDINGS  Left Ventricle: Left ventricular ejection fraction, by visual estimation, is 60  to 65%. The left ventricle has normal function. The left ventricle has no regional wall motion abnormalities. The left ventricular internal cavity size was the left ventricle is normal in size. There is no left ventricular hypertrophy. The left ventricular diastology could not be evaluated due to atrial fibrillation. Left ventricular diastolic function could not be evaluated. Right Ventricle: The right ventricular size is normal.  No increase in right ventricular wall thickness. Global RV systolic function is has normal systolic function. The tricuspid regurgitant velocity is 2.59 m/s, and with an assumed right atrial pressure  of 3 mmHg, the estimated right ventricular systolic pressure is normal at 29.8 mmHg. Left Atrium: Left atrial size was severely dilated. Right Atrium: Right atrial size was normal in size Pericardium: There is no evidence of pericardial effusion. Mitral Valve: The mitral valve is normal in structure. No evidence of mitral valve regurgitation. Tricuspid Valve: The tricuspid valve is normal in structure. Tricuspid valve regurgitation is trivial. Aortic Valve: The aortic valve is tricuspid. Aortic valve regurgitation is trivial. Mild aortic valve sclerosis is present, with no evidence of aortic valve stenosis. Pulmonic Valve: The pulmonic valve was grossly normal. Pulmonic valve regurgitation is not visualized. Pulmonic regurgitation is not visualized. Aorta: The aortic root is normal in size and structure. Venous: The inferior vena cava is normal in size with greater than 50% respiratory variability, suggesting right atrial pressure of 3 mmHg. IAS/Shunts: No atrial level shunt detected by color flow Doppler.  LEFT VENTRICLE PLAX 2D LVIDd:         4.10 cm LVIDs:         3.10 cm LV PW:         0.90 cm LV IVS:        0.90 cm LVOT diam:     1.70 cm LV SV:         36 ml LV SV Index:   22.34 LVOT Area:     2.27 cm  LV Volumes (MOD) LV area d, A2C:    18.90 cm LV area d, A4C:    21.30 cm LV area s,  A2C:    14.10 cm LV area s, A4C:    14.00 cm LV major d, A2C:   5.88 cm LV major d, A4C:   6.34 cm LV major s, A2C:   5.36 cm LV major s, A4C:   5.45 cm LV vol d, MOD A2C: 51.4 ml LV vol d, MOD A4C: 60.0 ml LV vol s, MOD A2C: 32.4 ml LV vol s, MOD A4C: 32.2 ml LV SV MOD A2C:     19.0 ml LV SV MOD A4C:     60.0 ml LV SV MOD BP:      25.1 ml RIGHT VENTRICLE TAPSE (M-mode): 1.1 cm LEFT ATRIUM             Index       RIGHT ATRIUM           Index LA diam:        4.00 cm 2.48 cm/m  RA Area:     10.99 cm LA Vol (A2C):   60.0 ml 37.26 ml/m RA Volume:   21.00 ml  13.04 ml/m LA Vol (A4C):   63.5 ml 39.44 ml/m LA Biplane Vol: 62.6 ml 38.88 ml/m  AORTIC VALVE LVOT Vmax:   73.30 cm/s LVOT Vmean:  48.800 cm/s LVOT VTI:    0.119 m  AORTA Ao Root diam: 2.70 cm TRICUSPID VALVE TR Peak grad:   26.8 mmHg TR Vmax:        259.00 cm/s  SHUNTS Systemic VTI:  0.12 m Systemic Diam: 1.70 cm  Thurmon Fair MD Electronically signed by Thurmon Fair MD Signature Date/Time: 12/31/2019/10:19:25 AM    Final     Cardiac Studies  TTE 12/31/2019  1. Left ventricular ejection fraction, by visual estimation, is 60 to 65%. The left ventricle has  normal function. There is no left ventricular hypertrophy.  2. Left ventricular diastolic function could not be evaluated.  3. The left ventricle has no regional wall motion abnormalities.  4. Global right ventricle has normal systolic function.The right ventricular size is normal. No increase in right ventricular wall thickness.  5. Left atrial size was severely dilated.  6. Right atrial size was normal.  7. The mitral valve is normal in structure. No evidence of mitral valve regurgitation.  8. The tricuspid valve is normal in structure.  9. The tricuspid valve is normal in structure. Tricuspid valve regurgitation is trivial. 10. The aortic valve is tricuspid. Aortic valve regurgitation is trivial. Mild aortic valve sclerosis without stenosis. 11. The pulmonic valve was grossly normal.  Pulmonic valve regurgitation is not visualized. 12. Normal pulmonary artery systolic pressure. 13. The inferior vena cava is normal in size with greater than 50% respiratory variability, suggesting right atrial pressure of 3 mmHg.  Patient Profile  Shirley [name] is a 86 y.o. female with dementia, recent COVID-19 pneumonia, PMR who was admitted on 12/31/2019 with atrial fibrillation RVR in the setting of newly diagnosed pulmonary embolism.  Assessment & Plan   1.  New onset atrial fibrillation with RVR -Secondary to pulmonary embolism.  Thyroid studies normal. -Remains in A. fib.  Has not converted despite IV amiodarone therapy.  Given her age, and dementia, I think a rate control strategy is the best option.  I will plan to transition her to an oral amiodarone load of 400 mg twice daily for 7 days and then 200 mg daily for 21 days.  She should stop amiodarone after 1 month.  This should not be a standing medication moving forward.  Please only prescribe 1 month at discharge.  She may eventually convert to sinus rhythm we will have to monitor this closely. -In the interim we will continue with rate control strategy Metroprolol tartrate, I have increased this to 50 mg twice daily for better rate control -Her blood pressure seems to be tolerating both of these therapies -She remains on a heparin drip and I suspect she will be transition to an oral agent at the discretion of hospital medicine -We have no plans for TEE/cardioversion at this time.  We will simply pursue the above measures. -We will arrange follow-up in our clinic in 4 weeks upon discharge  2.  Pulmonary emboli -Secondary to coronavirus and immobility -Likely will plan for anticoagulation for 3 to 6 months  3.  Ascending aortic aneurysm -4.9 cm ascending aorta on CTA -We will plan to follow this conservatively  For questions or updates, please contact [facility] Please consult [email or web address] for contact info under    Signed, Gerri Spore T. Flora Lipps, MD [facility]  01/02/2020 8:55 AM

## 2020-01-02 NOTE — Progress Notes (Signed)
Per MD request, f/u scheduled 2/22 -appt info on AVS.

## 2020-01-02 NOTE — Progress Notes (Signed)
PROGRESS NOTE    Shirley [name]  YQM:[id number] DOB: 23-Jun-1934 DOA: 12/30/2019 PCP: Dorena Cookey, MD (Inactive)   Brief Narrative 86 y.o.femalewith medical history significant ofhypertension, polymyalgia rheumatica, dementia, SVT presenting to the ED for evaluation of altered mental status. Patient was recently admitted to the hospital from 12/28-1/9 forCOVIDpneumoniaand HCAP. She was treated with remdesivir, steroids, and antibiotics. She was discharged to [facility] rehab facility,and day of admission she was discharged from the rehab facility to her ALF,patient presents from facility secondary to increased weakness, lethargy and altered mental status, she was noted to be in A. fib with RVR.  Assessment & Plan:   Principal Problem:   HCAP (healthcare-associated pneumonia) Active Problems:   Acute encephalopathy   New onset atrial fibrillation ([place])   Pneumonia   #1HCAP-patient admitted to the hospital 1228 through January 9 of 2021 for Covid pneumonia and HCAP treated with steroids antibiotics and remdesivir.  Vanco and cefepime stopped.  Procalcitonin normal.   #2 pulmonary embolism on IV heparin pharmacy consulted for Eliquis.   #3 new onset A. fib RVR -on amiodarone and metoprolol tartrate. Echo with normal ejection fraction.  I am you to be continued for a total of 1 month.  #4 acute metabolic encephalopathy likely secondary to medical illness in the setting of dementia. CT head-stable chronic changes remote cortically based left frontal lobe infarct diffuse parenchymal atrophy.  Chronic microvascular angiopathy TSH 1.39 B12  4445 Ammonia 31  #5 hypotension 120/66 monitor on amiodarone and metoprolol tartrate.    Nutrition Problem: Inadequate oral intake Etiology: dysphagia, lethargy/confusion     Signs/Symptoms: (chart review)    Interventions: Ensure Enlive (each supplement provides 350kcal and 20 grams of protein)  Estimated body mass index is  21.99 kg/m as calculated from the following:   Height as of this encounter: _0  (1.6 m).   Weight as of this encounter: 56.3 kg.  DVT prophylaxis: Heparin/ Eliquis to be started today. Code Status: DNR  family Communication: Discussed with son Disposition-patient came from assisted living facility she was there for a day or 2 prior to that she was in SNF [facility] she probably will likely will need to go back to SNF since she is very deconditioned and weak.  PT consult pending.  Case manager consulted.   Consultants: Cardiology   Procedures: None Antimicrobials: None  Subjective:  Resting in bed  Objective: Vitals:   01/01/20 2044 01/02/20 0423 01/02/20 0741 01/02/20 0948  BP: 104/69 (!) 128/95  120/66  Pulse: 97 60 69 84  Resp: (!) _1 Temp: 98.8 F (37.1 C) 97.8 F (36.6 C)    TempSrc: Axillary Axillary    SpO2: 92% 96% 96%   Weight:  56.3 kg    Height:        Intake/Output Summary (Last 24 hours) at 01/02/2020 1031 Last data filed at 01/02/2020 0829 Gross per 24 hour  Intake 1120.14 ml  Output 650 ml  Net 470.14 ml   Filed Weights   12/30/19 1957 01/01/20 0509 01/02/20 0423  Weight: 59 kg 55.8 kg 56.3 kg    Examination:  General exam: Appears calm and comfortable  Respiratory system: Clear to auscultation. Respiratory effort normal. Cardiovascular system: S1 & S2 heard, RRR. No JVD, murmurs, rubs, gallops or clicks. No pedal edema. Gastrointestinal system: Abdomen is nondistended, soft and nontender. No organomegaly or masses felt. Normal bowel sounds heard. Central nervous system: Confused  extremities: Symmetric 5 x 5 power. Skin: No rashes, lesions  or ulcers Psychiatry: Confused  Data Reviewed: I have personally reviewed following labs and imaging studies  CBC: Recent Labs  Lab 12/30/19 2031 01/01/20 0349 01/02/20 0243  WBC 7.1 6.6 6.3  NEUTROABS 5.0  --   --   HGB 15.3* 14.1 13.6  HCT 47.4* 41.3 40.6  MCV 95.2 91.4 91.6  PLT 211  240 329   Basic Metabolic Panel: Recent Labs  Lab 12/30/19 2031 01/01/20 0349  NA 136 134*  K 3.7 3.5  CL 99 101  CO2 22 23  GLUCOSE 182* 122*  BUN 13 13  CREATININE 0.80 0.59  CALCIUM 9.0 8.7*   GFR: Estimated Creatinine Clearance: 44.1 mL/min (by C-G formula based on SCr of 0.59 mg/dL). Liver Function Tests: Recent Labs  Lab 12/30/19 2031 01/01/20 0349  AST 24 20  ALT 20 17  ALKPHOS 82 78  BILITOT 0.6 0.6  PROT 5.8* 5.6*  ALBUMIN 2.8* 2.6*   No results for input(s): LIPASE, AMYLASE in the last 168 hours. Recent Labs  Lab 12/31/19 0212  AMMONIA 31   Coagulation Profile: No results for input(s): INR, PROTIME in the last 168 hours. Cardiac Enzymes: No results for input(s): CKTOTAL, CKMB, CKMBINDEX, TROPONINI in the last 168 hours. BNP (last 3 results) No results for input(s): PROBNP in the last 8760 hours. HbA1C: No results for input(s): HGBA1C in the last 72 hours. CBG: No results for input(s): GLUCAP in the last 168 hours. Lipid Profile: No results for input(s): CHOL, HDL, LDLCALC, TRIG, CHOLHDL, LDLDIRECT in the last 72 hours. Thyroid Function Tests: Recent Labs    12/31/19 0212  TSH 1.397   Anemia Panel: Recent Labs    12/31/19 0212  VITAMINB12 4,445*  FERRITIN 177   Sepsis Labs: Recent Labs  Lab 12/30/19 2031 12/31/19 0212  PROCALCITON  --  <0.10  LATICACIDVEN 3.9* 2.0*    Recent Results (from the past 240 hour(s))  Culture, blood (routine x 2)     Status: None (Preliminary result)   Collection Time: 12/31/19  2:12 AM   Specimen: BLOOD LEFT HAND  Result Value Ref Range Status   Specimen Description BLOOD LEFT HAND  Final   Special Requests   Final    BOTTLES DRAWN AEROBIC AND ANAEROBIC Blood Culture adequate volume   Culture   Final    NO GROWTH 2 DAYS Performed at [facility], [place] [address], [place], [place] [address]    Report Status PENDING  Incomplete  Respiratory Panel by PCR     Status: None   Collection Time:  12/31/19  2:12 AM   Specimen: Flu Kit Nasopharyngeal Swab; Respiratory  Result Value Ref Range Status   Adenovirus NOT DETECTED NOT DETECTED Final   Coronavirus 229E NOT DETECTED NOT DETECTED Final    Comment: (NOTE) The Coronavirus on the Respiratory Panel, DOES NOT test for the novel  Coronavirus (2019 nCoV)    Coronavirus HKU1 NOT DETECTED NOT DETECTED Final   Coronavirus NL63 NOT DETECTED NOT DETECTED Final   Coronavirus OC43 NOT DETECTED NOT DETECTED Final   Metapneumovirus NOT DETECTED NOT DETECTED Final   Rhinovirus / Enterovirus NOT DETECTED NOT DETECTED Final   Influenza A NOT DETECTED NOT DETECTED Final   Influenza B NOT DETECTED NOT DETECTED Final   Parainfluenza Virus 1 NOT DETECTED NOT DETECTED Final   Parainfluenza Virus 2 NOT DETECTED NOT DETECTED Final   Parainfluenza Virus 3 NOT DETECTED NOT DETECTED Final   Parainfluenza Virus 4 NOT DETECTED NOT DETECTED Final  Respiratory Syncytial Virus NOT DETECTED NOT DETECTED Final   Bordetella pertussis NOT DETECTED NOT DETECTED Final   Chlamydophila pneumoniae NOT DETECTED NOT DETECTED Final   Mycoplasma pneumoniae NOT DETECTED NOT DETECTED Final    Comment: Performed at [facility], [place] [address], [place], [place] [address]  SARS CORONAVIRUS 2 (TAT 6-24 HRS) Nasopharyngeal Flu Kit Nasopharyngeal Swab     Status: None   Collection Time: 12/31/19  2:12 AM   Specimen: Flu Kit Nasopharyngeal Swab  Result Value Ref Range Status   SARS Coronavirus 2 NEGATIVE NEGATIVE Final    Comment: (NOTE) SARS-CoV-2 target nucleic acids are NOT DETECTED. The SARS-CoV-2 RNA is generally detectable in upper and lower respiratory specimens during the acute phase of infection. Negative results do not preclude SARS-CoV-2 infection, do not rule out co-infections with other pathogens, and should not be used as the sole basis for treatment or other patient management decisions. Negative results must be combined with clinical  observations, patient history, and epidemiological information. The expected result is Negative. Fact Sheet for Patients: SugarRoll.be Fact Sheet for Healthcare Providers: https://www.woods-.com/ This test is not yet approved or cleared by the [place] FDA and  has been authorized for detection and/or diagnosis of SARS-CoV-2 by FDA under an Emergency Use Authorization (EUA). This EUA will remain  in effect (meaning this test can be used) for the duration of the COVID-19 declaration under Section 56 4(b)(1) of the Act, 21 U.S.C. section 360bbb-3(b)(1), unless the authorization is terminated or revoked sooner. Performed at [facility], [place] [address] [place], [place] [address]   Culture, blood (routine x 2)     Status: None (Preliminary result)   Collection Time: 12/31/19  3:00 AM   Specimen: BLOOD RIGHT HAND  Result Value Ref Range Status   Specimen Description BLOOD RIGHT HAND  Final   Special Requests   Final    BOTTLES DRAWN AEROBIC AND ANAEROBIC Blood Culture results may not be optimal due to an inadequate volume of blood received in culture bottles   Culture   Final    NO GROWTH 2 DAYS Performed at [facility], [place] [address] [place], [place] [address]    Report Status PENDING  Incomplete         Radiology Studies: CT ANGIO CHEST PE W OR WO CONTRAST  Result Date: 12/31/2019 CLINICAL DATA:  86 year old female with history of shortness of breath. COVID-19 infection. Evaluate for pulmonary embolism. EXAM: CT ANGIOGRAPHY CHEST WITH CONTRAST TECHNIQUE: Multidetector CT imaging of the chest was performed using the standard protocol during bolus administration of intravenous contrast. Multiplanar CT image reconstructions and MIPs were obtained to evaluate the vascular anatomy. CONTRAST:  167m OMNIPAQUE IOHEXOL 350 MG/ML SOLN COMPARISON:  Chest CT 07/04/2015. FINDINGS: Cardiovascular: Multiple filling defects are  noted within the right pulmonary arteries including distal right main, lobar to right upper and lower lobes and segmental and subsegmental sized right upper lobe branches. The majority of these are nonocclusive, although there does appear to be some occlusive disease in the right upper lobe. No definite filling defects are noted within the left-sided pulmonary arterial tree. Pulmonic trunk does not appear dilated. Mild cardiomegaly with biatrial dilatation, but no definitive findings to suggest right heart strain. There is aortic atherosclerosis, as well as atherosclerosis of the great vessels of the mediastinum and the coronary arteries, including calcified atherosclerotic plaque in the left main, left anterior descending, left circumflex and right coronary arteries. Aneurysmal dilatation of the  ascending thoracic aorta (4.9 cm in diameter). Diffuse ectasia of the thoracic aorta with aneurysmal dilatation of the descending thoracic aorta which measures up to 4.3 cm in diameter at the level of the left atrium. Mediastinum/Nodes: No pathologically enlarged mediastinal or hilar lymph nodes. Large hiatal hernia. No axillary lymphadenopathy. Lungs/Pleura: Patchy multifocal ground-glass attenuation, consolidative airspace disease and areas of septal thickening and interstitial prominence scattered throughout the lungs bilaterally, asymmetrically distributed, most confluent in the right lower lobe, compatible with multilobar pneumonia in the setting of known COVID-19 infection. No pleural effusions. Upper Abdomen: Aortic atherosclerosis. Musculoskeletal: Multiple chronic appearing compression fractures of T2, T3, T4, T5, T8, T9 and T12, most severe at T12 where there is 90% loss of anterior vertebral body height and acute kyphotic deformity. Multiple old healed left-sided rib fractures. There are no aggressive appearing lytic or blastic lesions noted in the visualized portions of the skeleton. Review of the MIP images  confirms the above findings. IMPRESSION: 1. Study is positive for pulmonary embolism with large burden of clot in the right-sided pulmonary arteries, as above. 2. Multilobar bilateral pneumonia compatible with reported COVID-19 infection. 3. Aortic atherosclerosis, in addition to left main and 3 vessel coronary artery disease. There is also aneurysmal dilatation of the ascending thoracic aorta (4.9 cm in diameter) and descending thoracic aorta (4.3 cm in diameter). Recommend semi-annual imaging followup by CTA or MRA and referral to cardiothoracic surgery if not already obtained. This recommendation follows 2010 ACCF/AHA/AATS/ACR/ASA/SCA/SCAI/SIR/STS/SVM Guidelines for the Diagnosis and Management of Patients With Thoracic Aortic Disease. Circulation. 2010; 121: C789-F810. Aortic aneurysm NOS (ICD10-I71.9). 4. Cardiomegaly with biatrial dilatation. 5. Additional incidental findings, as above. Critical Value/emergent results were called by telephone at the time of interpretation on 12/31/2019 at 2:52 pm to provider nurse Maudie Mercury in the ER, who verbally acknowledged these results. Aortic Atherosclerosis (ICD10-I70.0). Aortic aneurysm NOS (ICD10-I71.9). Electronically Signed   By: Vinnie Langton M.D.   On: 12/31/2019 14:59        Scheduled Meds: . amiodarone  400 mg Oral BID   Followed by  . [START ON 01/09/2020] amiodarone  200 mg Oral Daily  . vitamin C  500 mg Oral Daily  . feeding supplement (ENSURE ENLIVE)  237 mL Oral BID BM  . metoprolol tartrate  50 mg Oral BID  . zinc sulfate  220 mg Oral Daily   Continuous Infusions: . heparin 900 Units/hr (01/02/20 0554)     LOS: 3 days     Georgette Shell, MD [facility]  If 7PM-7AM, please contact night-coverage [email or web address] Password [facility] 01/02/2020, 10:31 AM

## 2020-01-02 NOTE — Progress Notes (Signed)
ANTICOAGULATION CONSULT NOTE  Pharmacy Consult for heparin Indication: atrial fibrillation/PE  No Known Allergies  Patient Measurements: Height: 5\' 3"  (160 cm) Weight: 124 lb 1.9 oz (56.3 kg) IBW/kg (Calculated) : 52.4  Vital Signs: Temp: 97.8 F (36.6 C) (01/28 0423) Temp Source: Axillary (01/28 0423) BP: 128/95 (01/28 0423) Pulse Rate: 60 (01/28 0423)  Labs: Recent Labs    12/30/19 2031 12/31/19 1720 01/01/20 0349 01/01/20 1153 01/02/20 0243  HGB 15.3*  --  14.1  --  13.6  HCT 47.4*  --  41.3  --  40.6  PLT 211  --  240  --  224  HEPARINUNFRC  --    < > 0.40 0.46 <0.10*  CREATININE 0.80  --  0.59  --   --    < > = values in this interval not displayed.    Estimated Creatinine Clearance: 44.1 mL/min (by C-G formula based on SCr of 0.59 mg/dL).  Assessment: 86 y.o. female with Afib and PE for heparin  Goal of Therapy:  Heparin level 0.3-0.7 units/ml Monitor platelets by anticoagulation protocol: Yes   Plan:  Heparin 2000 units IV bolus, then increase heparin  900 units/hr Check heparin level in 6 hours.   91, PharmD, BCPS

## 2020-01-03 LAB — CBC
HCT: 39.9 % (ref 36.0–46.0)
Hemoglobin: 13.3 g/dL (ref 12.0–15.0)
MCH: 30.7 pg (ref 26.0–34.0)
MCHC: 33.3 g/dL (ref 30.0–36.0)
MCV: 92.1 fL (ref 80.0–100.0)
Platelets: 277 10*3/uL (ref 150–400)
RBC: 4.33 MIL/uL (ref 3.87–5.11)
RDW: 15.9 % — ABNORMAL HIGH (ref 11.5–15.5)
WBC: 5.3 10*3/uL (ref 4.0–10.5)
nRBC: 0 % (ref 0.0–0.2)

## 2020-01-03 MED ORDER — APIXABAN 5 MG PO TABS: 5.0000 mg | ORAL_TABLET | Freq: Two times a day (BID) | ORAL | 1 refills | Status: DC

## 2020-01-03 MED ORDER — ALBUTEROL SULFATE HFA 108 (90 BASE) MCG/ACT IN AERS: 2.0000 | INHALATION_SPRAY | RESPIRATORY_TRACT | 1 refills | Status: AC | PRN

## 2020-01-03 MED ORDER — AMIODARONE HCL 200 MG PO TABS: 200.0000 mg | ORAL_TABLET | Freq: Every day | ORAL | 0 refills | Status: DC

## 2020-01-03 MED ORDER — APIXABAN 5 MG PO TABS: 10.0000 mg | ORAL_TABLET | Freq: Two times a day (BID) | ORAL | 0 refills | Status: DC

## 2020-01-03 MED ORDER — AMIODARONE HCL 400 MG PO TABS: 400.0000 mg | ORAL_TABLET | Freq: Two times a day (BID) | ORAL | 0 refills | Status: DC

## 2020-01-03 MED ORDER — METOPROLOL TARTRATE 50 MG PO TABS: 50.0000 mg | ORAL_TABLET | Freq: Two times a day (BID) | ORAL | 1 refills | Status: AC

## 2020-01-03 NOTE — Progress Notes (Signed)
Cardiology Progress Note  Patient ID: Shirley [name] MRN: [id number] DOB: 02-Sep-1934 Date of Encounter: 01/03/2020  Primary Cardiologist: Reatha Harps, MD  Subjective   Unaware of Afib, HR generally controlled, no CP or SOB  ROS:  All other ROS reviewed and negative. Pertinent positives noted in the HPI.     Inpatient Medications  Scheduled Meds: . amiodarone  400 mg Oral BID   Followed by  . [START ON 01/09/2020] amiodarone  200 mg Oral Daily  . apixaban  10 mg Oral BID   Followed by  . [START ON 01/09/2020] apixaban  5 mg Oral BID  . vitamin C  500 mg Oral Daily  . feeding supplement (ENSURE ENLIVE)  237 mL Oral BID BM  . metoprolol tartrate  50 mg Oral BID  . zinc sulfate  220 mg Oral Daily   Continuous Infusions:  PRN Meds: acetaminophen, albuterol, chlorpheniramine-HYDROcodone, guaiFENesin-dextromethorphan   Vital Signs   Vitals:   01/02/20 1951 01/02/20 2000 01/02/20 2151 01/03/20 0441  BP: (!) 117/52  122/81 110/83  Pulse: 69 92 95 71  Resp: 16 18 18 16   Temp:    (!) 97.5 F (36.4 C)  TempSrc:    Oral  SpO2: 95% 95% 95% 94%  Weight:    56.6 kg  Height:        Intake/Output Summary (Last 24 hours) at 01/03/2020 0805 Last data filed at 01/02/2020 1845 Gross per 24 hour  Intake 360 ml  Output 450 ml  Net -90 ml   Last 3 Weights 01/03/2020 01/02/2020 01/01/2020  Weight (lbs) 124 lb 12.5 oz 124 lb 1.9 oz 123 lb 0.3 oz  Weight (kg) 56.6 kg 56.3 kg 55.8 kg      Telemetry  Telemetry: Afib, HR drops briefly at times, no sustained, which I personally reviewed.   ECG  The most recent ECG shows atrial fibrillation, heart rate 70, nonspecific ST-T changes noted, which I personally reviewed.   Physical Exam   Vitals:   01/02/20 1951 01/02/20 2000 01/02/20 2151 01/03/20 0441  BP: (!) 117/52  122/81 110/83  Pulse: 69 92 95 71  Resp: 16 18 18 16   Temp:    (!) 97.5 F (36.4 C)  TempSrc:    Oral  SpO2: 95% 95% 95% 94%  Weight:    56.6 kg  Height:          Intake/Output Summary (Last 24 hours) at 01/03/2020 0805 Last data filed at 01/02/2020 1845 Gross per 24 hour  Intake 360 ml  Output 450 ml  Net -90 ml    Last 3 Weights 01/03/2020 01/02/2020 01/01/2020  Weight (lbs) 124 lb 12.5 oz 124 lb 1.9 oz 123 lb 0.3 oz  Weight (kg) 56.6 kg 56.3 kg 55.8 kg    Body mass index is 22.1 kg/m.  General: Well nourished, frail, elderly female, in no acute distress Head: Atraumatic, normal size  Eyes: PEERLA, EOMI  Neck: Supple, no JVD Endocrine: No thryomegaly Cardiac: Irregular rhythm, no murmurs rubs or gallops Lungs: Clear to auscultation bilaterally, no wheezing, rhonchi or rales  Abd: Soft, nontender, no hepatomegaly  Ext: No edema, pulses 2+ Musculoskeletal: No deformities, BUE and BLE strength weak but equal Skin: Warm and dry, no rashes   Neuro: Alert and oriented to name and place only Psych: Normal mood and affect   Labs  High Sensitivity Troponin:  No results for input(s): TROPONINIHS in the last 720 hours.   Cardiac EnzymesNo results for input(s): TROPONINI in the  last 168 hours. No results for input(s): TROPIPOC in the last 168 hours.  Chemistry Recent Labs  Lab 12/30/19 2031 01/01/20 0349  NA 136 134*  K 3.7 3.5  CL 99 101  CO2 22 23  GLUCOSE 182* 122*  BUN 13 13  CREATININE 0.80 0.59  CALCIUM 9.0 8.7*  PROT 5.8* 5.6*  ALBUMIN 2.8* 2.6*  AST 24 20  ALT 20 17  ALKPHOS 82 78  BILITOT 0.6 0.6  GFRNONAA >60 >60  GFRAA >60 >60  ANIONGAP 15 10    Hematology Recent Labs  Lab 01/01/20 0349 01/02/20 0243 01/03/20 0515  WBC 6.6 6.3 5.3  RBC 4.52 4.43 4.33  HGB 14.1 13.6 13.3  HCT 41.3 40.6 39.9  MCV 91.4 91.6 92.1  MCH 31.2 30.7 30.7  MCHC 34.1 33.5 33.3  RDW 15.8* 15.8* 15.9*  PLT 240 224 277   BNPNo results for input(s): BNP, PROBNP in the last 168 hours.  DDimer  Recent Labs  Lab 12/31/19 0212  DDIMER 5.37*     Radiology  No results found.  Cardiac Studies  TTE 12/31/2019  1. Left ventricular  ejection fraction, by visual estimation, is 60 to 65%. The left ventricle has normal function. There is no left ventricular hypertrophy.  2. Left ventricular diastolic function could not be evaluated.  3. The left ventricle has no regional wall motion abnormalities.  4. Global right ventricle has normal systolic function.The right ventricular size is normal. No increase in right ventricular wall thickness.  5. Left atrial size was severely dilated.  6. Right atrial size was normal.  7. The mitral valve is normal in structure. No evidence of mitral valve regurgitation.  8. The tricuspid valve is normal in structure.  9. The tricuspid valve is normal in structure. Tricuspid valve regurgitation is trivial. 10. The aortic valve is tricuspid. Aortic valve regurgitation is trivial. Mild aortic valve sclerosis without stenosis. 11. The pulmonic valve was grossly normal. Pulmonic valve regurgitation is not visualized. 12. Normal pulmonary artery systolic pressure. 13. The inferior vena cava is normal in size with greater than 50% respiratory variability, suggesting right atrial pressure of 3 mmHg.  Patient Profile  Shirley [name] is a 86 y.o. female with dementia, recent COVID-85 pneumonia, PMR who was admitted on 12/31/2019 with atrial fibrillation RVR in the setting of newly diagnosed pulmonary embolism.  Assessment & Plan   1.  New onset atrial fibrillation with RVR - persistent, HR generally controlled - amio instructions are: amiodarone load of 400 mg twice daily for 7 days and then 200 mg daily for 21 days.  She should stop amiodarone after 1 month  2.  Pulmonary emboli -Secondary to coronavirus and immobility -Likely will plan for anticoagulation for 3 to 6 months  3.  Ascending aortic aneurysm -4.9 cm ascending aorta on CTA -We will plan to follow this conservatively  For questions or updates, please contact [place] Please consult [email or web address] for contact info under    Signed, Rosaria Ferries, [place]  01/03/2020 8:05 AM

## 2020-01-03 NOTE — Progress Notes (Signed)
Report called in to [facility] [address]  [phone] ). Had left call back message  [phone] for them to call back for report.

## 2020-01-03 NOTE — Discharge Summary (Signed)
Physician Discharge Summary  Shirley [name] VQX:[id number] DOB: 1934/02/13 DOA: 12/30/2019  PCP: Dorena Cookey, MD (Inactive)  Admit date: 12/30/2019 Discharge date: 01/03/2020  Admitted From: Assisted living facility Disposition: Skilled nursing facility Recommendations for Outpatient Follow-up:  1. Follow up with PCP in 1-2 weeks 2. Please obtain BMP/CBC in one week 3. Follow-up with cardiology  4. Patient will benefit from palliative care at the facility Home Health none Equipment/Devices: None  discharge Condition: Stable and improved  CODE STATUS DO NOT RESUSCITATE Diet recommendation: Cardiac diet Brief/Interim Summary:85 y.o.femalewith medical history significant ofhypertension, polymyalgia rheumatica, dementia, SVT presenting to the ED for evaluation of altered mental status. Patient was recently admitted to the hospital from 12/28-1/9 forCOVIDpneumoniaand HCAP. She was treated with remdesivir, steroids, and antibiotics. She was discharged to [facility] rehab facility,and day of admission she was discharged from the rehab facility to her ALF,patient presents from facility secondary to increased weakness, lethargy and altered mental status, she was noted to be in A. fib with RVR. Discharge Diagnoses:  Principal Problem:   HCAP (healthcare-associated pneumonia) Active Problems:   Acute encephalopathy   New onset atrial fibrillation (HCC)   Pneumonia   #1covid pneumonia-treated with remdesivir and Decadron.  #2 pulmonary embolism on Eliquis continue for 3 to 6 months.    #3 new onset A. fib RVR -on amiodarone and metoprolol tartrate. Echo with normal ejection fraction.  Continue amiodarone for a month.  Continue metoprolol indefinitely.  #4 acute metabolic encephalopathy likely secondary to medical illness in the setting of dementia. CT head-stable chronic changes remote cortically based left frontal lobe infarct diffuse parenchymal atrophy. Chronic microvascular  angiopathy TSH1.39 [id number] Ammonia31  #5  Hypertension blood pressure 127/91 she is on amiodarone and metoprolol monitor closely.  #6 DO NOT RESUSCITATE    Nutrition Problem: Inadequate oral intake Etiology: dysphagia, lethargy/confusion    Signs/Symptoms: (chart review)     Interventions: Ensure Enlive (each supplement provides 350kcal and 20 grams of protein)  Estimated body mass index is 22.1 kg/m as calculated from the following:   Height as of this encounter: _0  (1.6 m).   Weight as of this encounter: 56.6 kg.  Discharge Instructions  Discharge Instructions    Diet - low sodium heart healthy   Complete by: As directed    Increase activity slowly   Complete by: As directed      Allergies as of 01/03/2020   No Known Allergies     Medication List    STOP taking these medications   amLODipine 5 MG tablet Commonly known as: NORVASC   metoprolol succinate 25 MG 24 hr tablet Commonly known as: TOPROL-XL     TAKE these medications   albuterol 108 (90 Base) MCG/ACT inhaler Commonly known as: VENTOLIN HFA Inhale 2 puffs into the lungs every 4 (four) hours as needed for wheezing or shortness of breath.   amiodarone 400 MG tablet Commonly known as: PACERONE Take 1 tablet (400 mg total) by mouth 2 (two) times daily. Take twice a day for 6 days   amiodarone 200 MG tablet Commonly known as: PACERONE Take 1 tablet (200 mg total) by mouth daily. Start on 01/09/2020 2 of the tablets are done. Start taking on: January 09, 2020   apixaban 5 MG Tabs tablet Commonly known as: ELIQUIS Take 2 tablets (10 mg total) by mouth 2 (two) times daily. Take 10 mg twice a day for 6 days   apixaban 5 MG Tabs tablet Commonly known as: ELIQUIS Take 1  tablet (5 mg total) by mouth 2 (two) times daily. Start taking on: January 09, 2020   ascorbic acid 500 MG tablet Commonly known as: VITAMIN C Take 500 mg by mouth 2 (two) times daily.   citalopram 10 MG  tablet Commonly known as: CELEXA TAKE 2 TABLETS EVERY DAY What changed: how much to take   donepezil 10 MG tablet Commonly known as: ARICEPT TAKE 1 TABLET EVERY DAY What changed:   how much to take  how to take this  when to take this  additional instructions   metoprolol tartrate 50 MG tablet Commonly known as: LOPRESSOR Take 1 tablet (50 mg total) by mouth 2 (two) times daily.   Minerin Creme Crea Apply 1 application topically every 12 (twelve) hours as needed (to affected area(s)- for rashes).   triamcinolone cream 0.1 % Commonly known as: KENALOG Compound 1;1 with Eucerin cream and apply bid prn rash. What changed:   how much to take  how to take this  when to take this  reasons to take this  additional instructions   vitamin B-12 1000 MCG tablet Commonly known as: CYANOCOBALAMIN Take 2 tablets (2,000 mcg total) by mouth daily. What changed: how much to take   Vitamin D-3 25 MCG (1000 UT) Caps Take 1,000 Units by mouth daily.   Zinc 100 MG Tabs Take 100 mg by mouth daily.      Follow-up Information    O'Neal, Cassie Freer, MD Follow up.   Specialties: Internal Medicine, Cardiology, Radiology Why: [facility] location - Jan 27, 2020 at 11:00 AM as listed below. Please arrive 15 minutes early to check in. Dr. Audie Box is the cardiologist you met in the hospital. Contact information: [place] [address] [phone]        Dorena Cookey, MD Follow up.   Specialty: Family Medicine         No Known Allergies  Consultations: Cardiology Dr. Davina Poke  Procedures/Studies: CT HEAD WO CONTRAST  Result Date: 12/31/2019 CLINICAL DATA:  Encephalopathy, history of hypertension EXAM: CT HEAD WITHOUT CONTRAST TECHNIQUE: Contiguous axial images were obtained from the base of the skull through the vertex without intravenous contrast. COMPARISON:  Perfusion study 11/23/2019, 11/24/2019 FINDINGS: Brain: Diffuse atrophy  and ventricular dilatation is stable from prior comparisons. Notable atrophy of the mesial temporal lobes, also unchanged from prior. 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 No convincing evidence of acute infarction, hemorrhage, hydrocephalus, extra-axial collection or mass lesion/mass effect. Patchy areas of white matter hypoattenuation are most compatible with advanced chronic microvascular angiopathy. Vascular: Atherosclerotic calcification of the carotid siphons and intradural vertebral arteries. No hyperdense vessel. Skull: No calvarial fracture or suspicious osseous lesion. No scalp swelling or hematoma. Sinuses/Orbits: Pneumatized secretions with air-fluid level in the left maxillary sinus. Background of mild bilateral maxillary mucosal thickening as well. Remaining paranasal sinuses and mastoids are predominantly clear. Orbital structures are unremarkable aside from prior lens extractions. Other: None IMPRESSION: 1. No acute intracranial abnormality. 2. Stable chronic changes including sequela of a remote cortically based left frontal lobe infarct, diffuse parenchymal atrophy including atrophy of the mesial temporal lobes and advanced chronic microvascular angiopathy. 3. Pneumatized secretions with air-fluid level in the left maxillary sinus. Correlate for signs of acute sinusitis. Electronically Signed   By: Lovena Le M.D.   On: 12/31/2019 04:58   CT ANGIO CHEST PE W OR WO CONTRAST  Result Date: 12/31/2019 CLINICAL DATA:  86 year old female with history  of shortness of breath. COVID-19 infection. Evaluate for pulmonary embolism. EXAM: CT ANGIOGRAPHY CHEST WITH CONTRAST TECHNIQUE: Multidetector CT imaging of the chest was performed using the standard protocol during bolus administration of intravenous contrast. Multiplanar CT image reconstructions and MIPs were obtained to evaluate the vascular anatomy. CONTRAST:  159m OMNIPAQUE IOHEXOL 350 MG/ML SOLN COMPARISON:  Chest  CT 07/04/2015. FINDINGS: Cardiovascular: Multiple filling defects are noted within the right pulmonary arteries including distal right main, lobar to right upper and lower lobes and segmental and subsegmental sized right upper lobe branches. The majority of these are nonocclusive, although there does appear to be some occlusive disease in the right upper lobe. No definite filling defects are noted within the left-sided pulmonary arterial tree. Pulmonic trunk does not appear dilated. Mild cardiomegaly with biatrial dilatation, but no definitive findings to suggest right heart strain. There is aortic atherosclerosis, as well as atherosclerosis of the great vessels of the mediastinum and the coronary arteries, including calcified atherosclerotic plaque in the left main, left anterior descending, left circumflex and right coronary arteries. Aneurysmal dilatation of the ascending thoracic aorta (4.9 cm in diameter). Diffuse ectasia of the thoracic aorta with aneurysmal dilatation of the descending thoracic aorta which measures up to 4.3 cm in diameter at the level of the left atrium. Mediastinum/Nodes: No pathologically enlarged mediastinal or hilar lymph nodes. Large hiatal hernia. No axillary lymphadenopathy. Lungs/Pleura: Patchy multifocal ground-glass attenuation, consolidative airspace disease and areas of septal thickening and interstitial prominence scattered throughout the lungs bilaterally, asymmetrically distributed, most confluent in the right lower lobe, compatible with multilobar pneumonia in the setting of known COVID-19 infection. No pleural effusions. Upper Abdomen: Aortic atherosclerosis. Musculoskeletal: Multiple chronic appearing compression fractures of T2, T3, T4, T5, T8, T9 and T12, most severe at T12 where there is 90% loss of anterior vertebral body height and acute kyphotic deformity. Multiple old healed left-sided rib fractures. There are no aggressive appearing lytic or blastic lesions noted in  the visualized portions of the skeleton. Review of the MIP images confirms the above findings. IMPRESSION: 1. Study is positive for pulmonary embolism with large burden of clot in the right-sided pulmonary arteries, as above. 2. Multilobar bilateral pneumonia compatible with reported COVID-19 infection. 3. Aortic atherosclerosis, in addition to left main and 3 vessel coronary artery disease. There is also aneurysmal dilatation of the ascending thoracic aorta (4.9 cm in diameter) and descending thoracic aorta (4.3 cm in diameter). Recommend semi-annual imaging followup by CTA or MRA and referral to cardiothoracic surgery if not already obtained. This recommendation follows 2010 ACCF/AHA/AATS/ACR/ASA/SCA/SCAI/SIR/STS/SVM Guidelines for the Diagnosis and Management of Patients With Thoracic Aortic Disease. Circulation. 2010; 121:: Q300-P233 Aortic aneurysm NOS (ICD10-I71.9). 4. Cardiomegaly with biatrial dilatation. 5. Additional incidental findings, as above. Critical Value/emergent results were called by telephone at the time of interpretation on 12/31/2019 at 2:52 pm to provider nurse KMaudie Mercuryin the ER, who verbally acknowledged these results. Aortic Atherosclerosis (ICD10-I70.0). Aortic aneurysm NOS (ICD10-I71.9). Electronically Signed   By: DVinnie LangtonM.D.   On: 12/31/2019 14:59   DG Chest Port 1 View  Result Date: 12/30/2019 CLINICAL DATA:  Shortness of breath and weakness. Recent COVID positive. EXAM: PORTABLE CHEST 1 VIEW COMPARISON:  12/07/2019 a prior radiographs FINDINGS: Mild cardiomegaly again noted. Bilateral LOWER lung airspace opacities are stable to slightly increased. No pleural effusion or pneumothorax. No acute bony abnormalities. RIGHT shoulder arthroplasty changes again identified. IMPRESSION: Stable to slightly increased bilateral LOWER lung airspace opacities/pneumonia. Electronically Signed   By: JMargarette Canada  M.D.   On: 12/30/2019 21:06   DG CHEST PORT 1 VIEW  Result Date:  12/07/2019 CLINICAL DATA:  Hypoxia. EXAM: PORTABLE CHEST 1 VIEW COMPARISON:  December 02, 2019. FINDINGS: Stable cardiomegaly. No pneumothorax or pleural effusion is noted. Status post right total shoulder arthroplasty. Increased left midlung opacity is noted with increased right basilar opacity concerning for worsening multifocal pneumonia. IMPRESSION: Increased left midlung opacity is noted with increased right basilar opacity concerning for worsening multifocal pneumonia. Electronically Signed   By: Marijo Conception M.D.   On: 12/07/2019 08:37   ECHOCARDIOGRAM COMPLETE  Result Date: 12/31/2019   ECHOCARDIOGRAM REPORT   Patient Name:   Shirley [name] Date of Exam: 12/31/2019 Medical Rec #:  [id number]       Height:       63.0 in Accession #:    [id number]      Weight:       130.0 lb Date of Birth:  [birth year]       BSA:          1.61 m Patient Age:    73 years        BP:           109/76 mmHg Patient Gender: F               HR:           113 bpm. Exam Location:  Inpatient Procedure: 2D Echo, Cardiac Doppler and Color Doppler Indications:    Atrial Fibrillation 427.31  History:        Patient has no prior history of Echocardiogram examinations.                 Risk Factors:Hypertension and Former Smoker.  Sonographer:    Paulita Fujita RDCS Referring Phys: [id number] [facility]  Sonographer Comments: Patient did not understand commands. IMPRESSIONS  1. Left ventricular ejection fraction, by visual estimation, is 60 to 65%. The left ventricle has normal function. There is no left ventricular hypertrophy.  2. Left ventricular diastolic function could not be evaluated.  3. The left ventricle has no regional wall motion abnormalities.  4. Global right ventricle has normal systolic function.The right ventricular size is normal. No increase in right ventricular wall thickness.  5. Left atrial size was severely dilated.  6. Right atrial size was normal.  7. The mitral valve is normal in structure. No evidence of  mitral valve regurgitation.  8. The tricuspid valve is normal in structure.  9. The tricuspid valve is normal in structure. Tricuspid valve regurgitation is trivial. 10. The aortic valve is tricuspid. Aortic valve regurgitation is trivial. Mild aortic valve sclerosis without stenosis. 11. The pulmonic valve was grossly normal. Pulmonic valve regurgitation is not visualized. 12. Normal pulmonary artery systolic pressure. 13. The inferior vena cava is normal in size with greater than 50% respiratory variability, suggesting right atrial pressure of 3 mmHg. FINDINGS  Left Ventricle: Left ventricular ejection fraction, by visual estimation, is 60 to 65%. The left ventricle has normal function. The left ventricle has no regional wall motion abnormalities. The left ventricular internal cavity size was the left ventricle is normal in size. There is no left ventricular hypertrophy. The left ventricular diastology could not be evaluated due to atrial fibrillation. Left ventricular diastolic function could not be evaluated. Right Ventricle: The right ventricular size is normal. No increase in right ventricular wall thickness. Global RV systolic function is has normal systolic function. The tricuspid regurgitant velocity is 2.59 m/s,  and with an assumed right atrial pressure  of 3 mmHg, the estimated right ventricular systolic pressure is normal at 29.8 mmHg. Left Atrium: Left atrial size was severely dilated. Right Atrium: Right atrial size was normal in size Pericardium: There is no evidence of pericardial effusion. Mitral Valve: The mitral valve is normal in structure. No evidence of mitral valve regurgitation. Tricuspid Valve: The tricuspid valve is normal in structure. Tricuspid valve regurgitation is trivial. Aortic Valve: The aortic valve is tricuspid. Aortic valve regurgitation is trivial. Mild aortic valve sclerosis is present, with no evidence of aortic valve stenosis. Pulmonic Valve: The pulmonic valve was grossly  normal. Pulmonic valve regurgitation is not visualized. Pulmonic regurgitation is not visualized. Aorta: The aortic root is normal in size and structure. Venous: The inferior vena cava is normal in size with greater than 50% respiratory variability, suggesting right atrial pressure of 3 mmHg. IAS/Shunts: No atrial level shunt detected by color flow Doppler.  LEFT VENTRICLE PLAX 2D LVIDd:         4.10 cm LVIDs:         3.10 cm LV PW:         0.90 cm LV IVS:        0.90 cm LVOT diam:     1.70 cm LV SV:         36 ml LV SV Index:   22.34 LVOT Area:     2.27 cm  LV Volumes (MOD) LV area d, A2C:    18.90 cm LV area d, A4C:    21.30 cm LV area s, A2C:    14.10 cm LV area s, A4C:    14.00 cm LV major d, A2C:   5.88 cm LV major d, A4C:   6.34 cm LV major s, A2C:   5.36 cm LV major s, A4C:   5.45 cm LV vol d, MOD A2C: 51.4 ml LV vol d, MOD A4C: 60.0 ml LV vol s, MOD A2C: 32.4 ml LV vol s, MOD A4C: 32.2 ml LV SV MOD A2C:     19.0 ml LV SV MOD A4C:     60.0 ml LV SV MOD BP:      25.1 ml RIGHT VENTRICLE TAPSE (M-mode): 1.1 cm LEFT ATRIUM             Index       RIGHT ATRIUM           Index LA diam:        4.00 cm 2.48 cm/m  RA Area:     10.99 cm LA Vol (A2C):   60.0 ml 37.26 ml/m RA Volume:   21.00 ml  13.04 ml/m LA Vol (A4C):   63.5 ml 39.44 ml/m LA Biplane Vol: 62.6 ml 38.88 ml/m  AORTIC VALVE LVOT Vmax:   73.30 cm/s LVOT Vmean:  48.800 cm/s LVOT VTI:    0.119 m  AORTA Ao Root diam: 2.70 cm TRICUSPID VALVE TR Peak grad:   26.8 mmHg TR Vmax:        259.00 cm/s  SHUNTS Systemic VTI:  0.12 m Systemic Diam: 1.70 cm  Mihai Croitoru MD Electronically signed by Sanda Klein MD Signature Date/Time: 12/31/2019/10:19:25 AM    Final     (Echo, Carotid, EGD, Colonoscopy, ERCP)    Subjective:  She is resting in bed in no acute distress no acute events overnight Discharge Exam: Vitals:   01/03/20 0441 01/03/20 1006  BP: 110/83 (!) 127/91  Pulse: 71 66  Resp: 16  Temp: (!) 97.5 F (36.4 C)   SpO2: 94%     Vitals:   01/02/20 2000 01/02/20 2151 01/03/20 0441 01/03/20 1006  BP:  122/81 110/83 (!) 127/91  Pulse: 92 95 71 66  Resp: _0 Temp:   (!) 97.5 F (36.4 C)   TempSrc:   Oral   SpO2: 95% 95% 94%   Weight:   56.6 kg   Height:        General: Pt is alert, awake, not in acute distress Cardiovascular: Irregular, S1/S2 +, no rubs, no gallops Respiratory: CTA bilaterally, no wheezing, no rhonchi Abdominal: Soft, NT, ND, bowel sounds + Extremities: no edema, no cyanosis Irregular   The results of significant diagnostics from this hospitalization (including imaging, microbiology, ancillary and laboratory) are listed below for reference.     Microbiology: Recent Results (from the past 240 hour(s))  Culture, blood (routine x 2)     Status: None (Preliminary result)   Collection Time: 12/31/19  2:12 AM   Specimen: BLOOD LEFT HAND  Result Value Ref Range Status   Specimen Description BLOOD LEFT HAND  Final   Special Requests   Final    BOTTLES DRAWN AEROBIC AND ANAEROBIC Blood Culture adequate volume   Culture   Final    NO GROWTH 3 DAYS Performed at [facility], [address] [place], [place] [address]    Report Status PENDING  Incomplete  Respiratory Panel by PCR     Status: None   Collection Time: 12/31/19  2:12 AM   Specimen: Flu Kit Nasopharyngeal Swab; Respiratory  Result Value Ref Range Status   Adenovirus NOT DETECTED NOT DETECTED Final   Coronavirus 229E NOT DETECTED NOT DETECTED Final    Comment: (NOTE) The Coronavirus on the Respiratory Panel, DOES NOT test for the novel  Coronavirus (2019 nCoV)    Coronavirus HKU1 NOT DETECTED NOT DETECTED Final   Coronavirus NL63 NOT DETECTED NOT DETECTED Final   Coronavirus OC43 NOT DETECTED NOT DETECTED Final   Metapneumovirus NOT DETECTED NOT DETECTED Final   Rhinovirus / Enterovirus NOT DETECTED NOT DETECTED Final   Influenza A NOT DETECTED NOT DETECTED Final   Influenza B NOT DETECTED NOT DETECTED Final    Parainfluenza Virus 1 NOT DETECTED NOT DETECTED Final   Parainfluenza Virus 2 NOT DETECTED NOT DETECTED Final   Parainfluenza Virus 3 NOT DETECTED NOT DETECTED Final   Parainfluenza Virus 4 NOT DETECTED NOT DETECTED Final   Respiratory Syncytial Virus NOT DETECTED NOT DETECTED Final   Bordetella pertussis NOT DETECTED NOT DETECTED Final   Chlamydophila pneumoniae NOT DETECTED NOT DETECTED Final   Mycoplasma pneumoniae NOT DETECTED NOT DETECTED Final    Comment: Performed at [facility], [place]. [address], [place], [place] [address]  SARS CORONAVIRUS 2 (TAT 6-24 HRS) Nasopharyngeal Flu Kit Nasopharyngeal Swab     Status: None   Collection Time: 12/31/19  2:12 AM   Specimen: Flu Kit Nasopharyngeal Swab  Result Value Ref Range Status   SARS Coronavirus 2 NEGATIVE NEGATIVE Final    Comment: (NOTE) SARS-CoV-2 target nucleic acids are NOT DETECTED. The SARS-CoV-2 RNA is generally detectable in upper and lower respiratory specimens during the acute phase of infection. Negative results do not preclude SARS-CoV-2 infection, do not rule out co-infections with other pathogens, and should not be used as the sole basis for treatment or other patient management decisions. Negative results must be combined with clinical observations, patient history, and epidemiological information. The  expected result is Negative. Fact Sheet for Patients: SugarRoll.be Fact Sheet for Healthcare Providers: https://www.woods-.com/ This test is not yet approved or cleared by the [place] FDA and  has been authorized for detection and/or diagnosis of SARS-CoV-2 by FDA under an Emergency Use Authorization (EUA). This EUA will remain  in effect (meaning this test can be used) for the duration of the COVID-19 declaration under Section 56 4(b)(1) of the Act, 21 U.S.C. section 360bbb-3(b)(1), unless the authorization is terminated or revoked sooner. Performed at  [facility], [place] [address], [place], [place] [address]   Culture, blood (routine x 2)     Status: None (Preliminary result)   Collection Time: 12/31/19  3:00 AM   Specimen: BLOOD RIGHT HAND  Result Value Ref Range Status   Specimen Description BLOOD RIGHT HAND  Final   Special Requests   Final    BOTTLES DRAWN AEROBIC AND ANAEROBIC Blood Culture results may not be optimal due to an inadequate volume of blood received in culture bottles   Culture   Final    NO GROWTH 3 DAYS Performed at [facility], [place] [address] [place], [place] [address]    Report Status PENDING  Incomplete     Labs: BNP (last 3 results) No results for input(s): BNP in the last 8760 hours. Basic Metabolic Panel: Recent Labs  Lab 12/30/19 2031 01/01/20 0349  NA 136 134*  K 3.7 3.5  CL 99 101  CO2 22 23  GLUCOSE 182* 122*  BUN 13 13  CREATININE 0.80 0.59  CALCIUM 9.0 8.7*   Liver Function Tests: Recent Labs  Lab 12/30/19 2031 01/01/20 0349  AST 24 20  ALT 20 17  ALKPHOS 82 78  BILITOT 0.6 0.6  PROT 5.8* 5.6*  ALBUMIN 2.8* 2.6*   No results for input(s): LIPASE, AMYLASE in the last 168 hours. Recent Labs  Lab 12/31/19 0212  AMMONIA 31   CBC: Recent Labs  Lab 12/30/19 2031 01/01/20 0349 01/02/20 0243 01/03/20 0515  WBC 7.1 6.6 6.3 5.3  NEUTROABS 5.0  --   --   --   HGB 15.3* 14.1 13.6 13.3  HCT 47.4* 41.3 40.6 39.9  MCV 95.2 91.4 91.6 92.1  PLT 211 240 224 277   Cardiac Enzymes: No results for input(s): CKTOTAL, CKMB, CKMBINDEX, TROPONINI in the last 168 hours. BNP: Invalid input(s): POCBNP CBG: No results for input(s): GLUCAP in the last 168 hours. D-Dimer No results for input(s): DDIMER in the last 72 hours. Hgb A1c No results for input(s): HGBA1C in the last 72 hours. Lipid Profile No results for input(s): CHOL, HDL, LDLCALC, TRIG, CHOLHDL, LDLDIRECT in the last 72 hours. Thyroid function studies No results for input(s): TSH, T4TOTAL, T3FREE, THYROIDAB in  the last 72 hours.  Invalid input(s): FREET3 Anemia work up No results for input(s): VITAMINB12, FOLATE, FERRITIN, TIBC, IRON, RETICCTPCT in the last 72 hours. Urinalysis    Component Value Date/Time   COLORURINE AMBER (A) 12/30/2019 2220   APPEARANCEUR CLOUDY (A) 12/30/2019 2220   LABSPEC 1.020 12/30/2019 2220   PHURINE 5.0 12/30/2019 2220   GLUCOSEU 50 (A) 12/30/2019 2220   HGBUR NEGATIVE 12/30/2019 2220   HGBUR trace-intact 05/24/2010 0956   BILIRUBINUR NEGATIVE 12/30/2019 2220   BILIRUBINUR n 08/23/2016 1246   KETONESUR NEGATIVE 12/30/2019 2220   PROTEINUR 100 (A) 12/30/2019 2220   UROBILINOGEN 0.2 08/23/2016 1246   UROBILINOGEN 1.0 07/04/2015 1925   NITRITE NEGATIVE 12/30/2019 2220   LEUKOCYTESUR NEGATIVE 12/30/2019  2220   Sepsis Labs Invalid input(s): PROCALCITONIN,  WBC,  LACTICIDVEN Microbiology Recent Results (from the past 240 hour(s))  Culture, blood (routine x 2)     Status: None (Preliminary result)   Collection Time: 12/31/19  2:12 AM   Specimen: BLOOD LEFT HAND  Result Value Ref Range Status   Specimen Description BLOOD LEFT HAND  Final   Special Requests   Final    BOTTLES DRAWN AEROBIC AND ANAEROBIC Blood Culture adequate volume   Culture   Final    NO GROWTH 3 DAYS Performed at [facility], [place] [address], [place], [place] [address]    Report Status PENDING  Incomplete  Respiratory Panel by PCR     Status: None   Collection Time: 12/31/19  2:12 AM   Specimen: Flu Kit Nasopharyngeal Swab; Respiratory  Result Value Ref Range Status   Adenovirus NOT DETECTED NOT DETECTED Final   Coronavirus 229E NOT DETECTED NOT DETECTED Final    Comment: (NOTE) The Coronavirus on the Respiratory Panel, DOES NOT test for the novel  Coronavirus (2019 nCoV)    Coronavirus HKU1 NOT DETECTED NOT DETECTED Final   Coronavirus NL63 NOT DETECTED NOT DETECTED Final   Coronavirus OC43 NOT DETECTED NOT DETECTED Final   Metapneumovirus NOT DETECTED NOT DETECTED Final    Rhinovirus / Enterovirus NOT DETECTED NOT DETECTED Final   Influenza A NOT DETECTED NOT DETECTED Final   Influenza B NOT DETECTED NOT DETECTED Final   Parainfluenza Virus 1 NOT DETECTED NOT DETECTED Final   Parainfluenza Virus 2 NOT DETECTED NOT DETECTED Final   Parainfluenza Virus 3 NOT DETECTED NOT DETECTED Final   Parainfluenza Virus 4 NOT DETECTED NOT DETECTED Final   Respiratory Syncytial Virus NOT DETECTED NOT DETECTED Final   Bordetella pertussis NOT DETECTED NOT DETECTED Final   Chlamydophila pneumoniae NOT DETECTED NOT DETECTED Final   Mycoplasma pneumoniae NOT DETECTED NOT DETECTED Final    Comment: Performed at [facility], [place]. [address] [place], [place] [address]  SARS CORONAVIRUS 2 (TAT 6-24 HRS) Nasopharyngeal Flu Kit Nasopharyngeal Swab     Status: None   Collection Time: 12/31/19  2:12 AM   Specimen: Flu Kit Nasopharyngeal Swab  Result Value Ref Range Status   SARS Coronavirus 2 NEGATIVE NEGATIVE Final    Comment: (NOTE) SARS-CoV-2 target nucleic acids are NOT DETECTED. The SARS-CoV-2 RNA is generally detectable in upper and lower respiratory specimens during the acute phase of infection. Negative results do not preclude SARS-CoV-2 infection, do not rule out co-infections with other pathogens, and should not be used as the sole basis for treatment or other patient management decisions. Negative results must be combined with clinical observations, patient history, and epidemiological information. The expected result is Negative. Fact Sheet for Patients: SugarRoll.be Fact Sheet for Healthcare Providers: https://www.woods-.com/ This test is not yet approved or cleared by the [place] FDA and  has been authorized for detection and/or diagnosis of SARS-CoV-2 by FDA under an Emergency Use Authorization (EUA). This EUA will remain  in effect (meaning this test can be used) for the duration of the COVID-19  declaration under Section 56 4(b)(1) of the Act, 21 U.S.C. section 360bbb-3(b)(1), unless the authorization is terminated or revoked sooner. Performed at [facility], [place] [address] [place], [place] [address]   Culture, blood (routine x 2)     Status: None (Preliminary result)   Collection Time: 12/31/19  3:00 AM   Specimen: BLOOD RIGHT HAND  Result Value  Ref Range Status   Specimen Description BLOOD RIGHT HAND  Final   Special Requests   Final    BOTTLES DRAWN AEROBIC AND ANAEROBIC Blood Culture results may not be optimal due to an inadequate volume of blood received in culture bottles   Culture   Final    NO GROWTH 3 DAYS Performed at [facility], [place] [address] [place], [place] [address]    Report Status PENDING  Incomplete     Time coordinating discharge:  38 minutes  SIGNED:   Georgette Shell, MD  [facility] 01/03/2020, 1:48 PM Pager   If 7PM-7AM, please contact night-coverage [email or web address] Password [email or web address]

## 2020-01-03 NOTE — Progress Notes (Signed)
NURSING PROGRESS NOTE  Shirley [name] [id number] Discharge Data: 01/03/2020 3:17 PM Attending Provider: Alwyn Ren, MD WUJ:WJXB, Eugenio Hoes, MD (Inactive)     [name] to be D/C'd Skilled nursing facility per MD order.  Discussed with the patient the After Visit Summary and all questions fully answered. All IV's discontinued with no bleeding noted. All belongings returned to patient for patient to take home. Pt legal guardian was notified of discharge by social work. Report to be called to facility.   Last Vital Signs:  Blood pressure (!) 127/91, pulse 66, temperature (!) 97.5 F (36.4 C), temperature source Oral, resp. rate 16, height 5\' 3"  (1.6 m), weight 56.6 kg, SpO2 94 %.  Discharge Medication List Allergies as of 01/03/2020   No Known Allergies     Medication List    STOP taking these medications   amLODipine 5 MG tablet Commonly known as: NORVASC   metoprolol succinate 25 MG 24 hr tablet Commonly known as: TOPROL-XL     TAKE these medications   albuterol 108 (90 Base) MCG/ACT inhaler Commonly known as: VENTOLIN HFA Inhale 2 puffs into the lungs every 4 (four) hours as needed for wheezing or shortness of breath.   amiodarone 400 MG tablet Commonly known as: PACERONE Take 1 tablet (400 mg total) by mouth 2 (two) times daily. Take twice a day for 6 days   amiodarone 200 MG tablet Commonly known as: PACERONE Take 1 tablet (200 mg total) by mouth daily. Start on 01/09/2020 2 of the tablets are done. Start taking on: January 09, 2020   apixaban 5 MG Tabs tablet Commonly known as: ELIQUIS Take 2 tablets (10 mg total) by mouth 2 (two) times daily. Take 10 mg twice a day for 6 days   apixaban 5 MG Tabs tablet Commonly known as: ELIQUIS Take 1 tablet (5 mg total) by mouth 2 (two) times daily. Start taking on: January 09, 2020   ascorbic acid 500 MG tablet Commonly known as: VITAMIN C Take 500 mg by mouth 2 (two) times daily.   citalopram 10 MG  tablet Commonly known as: CELEXA TAKE 2 TABLETS EVERY DAY What changed: how much to take   donepezil 10 MG tablet Commonly known as: ARICEPT TAKE 1 TABLET EVERY DAY What changed:   how much to take  how to take this  when to take this  additional instructions   metoprolol tartrate 50 MG tablet Commonly known as: LOPRESSOR Take 1 tablet (50 mg total) by mouth 2 (two) times daily.   Minerin Creme Crea Apply 1 application topically every 12 (twelve) hours as needed (to affected area(s)- for rashes).   triamcinolone cream 0.1 % Commonly known as: KENALOG Compound 1;1 with Eucerin cream and apply bid prn rash. What changed:   how much to take  how to take this  when to take this  reasons to take this  additional instructions   vitamin B-12 1000 MCG tablet Commonly known as: CYANOCOBALAMIN Take 2 tablets (2,000 mcg total) by mouth daily. What changed: how much to take   Vitamin D-3 25 MCG (1000 UT) Caps Take 1,000 Units by mouth daily.   Zinc 100 MG Tabs Take 100 mg by mouth daily.

## 2020-01-03 NOTE — TOC Transition Note (Addendum)
Transition of Care [facility]) - CM/SW Discharge Note *Discharged to [facility] - Room 215  Patient Details  Name: Shirley [name] MRN: [id number] Date of Birth: [birth year]  Transition of Care [facility]) CM/SW Contact:  Cristobal Goldmann, LCSW Phone Number: 01/03/2020, 5:30 PM  Clinical Narrative: Son advised of patient's readiness for discharge today and he is still in agreement with patient going to [facility]. Facility contacted and can take patient today, pending insurance auth. Call made to [employer] for [facility] and due to waiver, patient approved for ST rehab eff. 1/29, reference number [id number]. CSW received a call later from [employer] (1:52 pm) approving patient - reference number [id number], approved for 5 days, next review date 2/2; Ines Bloomer Key is Care Coordinator and his ph [phone]. Wille Celeste, admissions director at [facility] contacted and information provided. Discharge summary transmitted to facility, and nurse provided with information to call report. Mr [name] (son) contacted and advised that ambulance transport arranged.    Final next level of care: Skilled Nursing Facility Barriers to Discharge: English as a second language teacher, Other (comment), SNF Pending bed offer(Facility search initiated today)   Patient Goals and CMS Choice Patient states their goals for this hospitalization and ongoing recovery are:: Son would like patient to return to rehab for strengthening before returning to [facility] CMS Medicare.gov Compare Post Acute Care list provided to:: Patient Represenative (must comment)(Son informed re: Medicare.gov and provided CSW with facility preference) Choice offered to / list presented to : Adult Children(Son advised of choice re: facility selection and would like his mother to return to [facility])  Discharge Placement  [facility]                     Discharge Plan and Services   Discharge Planning Services: Other - See  comment(SNF)            DME Arranged: N/A DME Agency: NA       HH Arranged: NA        Social Determinants of Health (SDOH) Interventions  No SDOH interventions needed prior to discharge.   Readmission Risk Interventions Readmission Risk Prevention Plan 12/10/2019  Transportation Screening Complete  Home Care Screening Not Complete  Home Care Screening Not Completed Comments Returning to SNF  Medication Review (RN CM) Complete  Some recent data might be hidden

## 2020-01-03 NOTE — Progress Notes (Signed)
  Speech Language Pathology Treatment: Dysphagia  Patient Details Name: Shirley [name] MRN: [id number] DOB: 1934-02-13 Today's Date: 01/03/2020 Time: 0947-0962 SLP Time Calculation (min) (ACUTE ONLY): 15 min  Assessment / Plan / Recommendation Clinical Impression  Pt was seen for skilled ST targeting diet tolerance.  She was encountered awake/alert and she was agreeable to tx session.  RN reported that pt had decreased po intake, but that she was tolerating her current diet without difficulty.  Pt consumed trials of thin liquid via straw sip, Dysphagia 1 (puree) solids, and Dysphagia 2 (fine chop) solids.  She exhibited prolonged mastication of Dys 2 solids and mastication appeared effortful.  She reported that she preferred pureed solids to finely chopped solids.  Pt exhibited timely AP transport with puree and thin liquid trials and no overt s/sx of aspiration were observed with any consistencies during this tx session.  Due to clinical presentation and pt preference, recommend diet change to Dysphagia 1 (puree) solids with continuation of thin liquids and medication whole or crushed in puree (per pt preference).  Suspect that due to cognition, this will be the safest long-term diet for the pt.  No further acute ST is warranted at this time; however, recommend additional ST at SNF to determine appropriateness for clinical diet upgrade at time of discharge.  Please re-consult if further needs arise.     HPI HPI: Shirley [name] is a 86 y.o. female with medical history significant of hypertension, polymyalgia rheumatica, dementia, SVT presenting to the ED for evaluation of altered mental status. Patient was recently admitted to the hospital from 12/28-1/9 for COVID pneumonia and HCAP.  She was treated with remdesivir, steroids, and antibiotics.  She was discharged to [facility] rehab facility.  She was discharged from the rehab facility back to [facility] which is her assisted living facility.  Per  staff there, patient arrived hypotensive with decreased responsiveness and was clammy.  Most recent BSE on 12/04/19 with recommendations for Dysphagia 1 (puree) solids and thin liquids.      SLP Plan  All goals met       Recommendations  Diet recommendations: Dysphagia 1 (puree);Thin liquid Liquids provided via: Cup;Straw Medication Administration: Whole meds with puree Supervision: Staff to assist with self feeding Compensations: Slow rate;Small sips/bites;Follow solids with liquid Postural Changes and/or Swallow Maneuvers: Seated upright 90 degrees                Oral Care Recommendations: Oral care BID;Staff/trained caregiver to provide oral care Follow up Recommendations: Skilled Nursing facility SLP Visit Diagnosis: Dysphagia, oral phase (R13.11) Plan: All goals met                     Colin Mulders M.S., CCC-SLP Acute Rehabilitation Services Office: [phone]  [place] 01/03/2020, 11:56 AM

## 2020-01-05 LAB — CULTURE, BLOOD (ROUTINE X 2)
Culture: NO GROWTH
Culture: NO GROWTH
Special Requests: ADEQUATE

## 2020-01-26 NOTE — Progress Notes (Deleted)
Cardiology Office Note:   Date:  01/26/2020  NAME:  Shirley [name]    MRN: [id number] DOB:  1934-04-08   PCP:  Roderick Pee, MD (Inactive)  Cardiologist:  Reatha Harps, MD  Electrophysiologist:  None   Referring MD: No ref. provider found   No chief complaint on file. ***  History of Present Illness:   Shirley [name] is a 86 y.o. female with a hx of COVID-19 pneumonia, pulmonary embolism atrial fibrillation who presents for evaluation of atrial fibrillation.  She was admitted to the hospital on 12/30/2019-01/03/2020 for new onset atrial fibrillation in the setting of a newly diagnosed pulmonary embolism.  She was recently diagnosed with coronavirus pneumonia and then represented to the hospital a few weeks afterwards with her new diagnosis of PE and atrial fibrillation.  Given her dementia she was treated medically.  She was converted to Eliquis for anticoagulation and she was placed on a brief course of amiodarone to determine if she would actually convert to sinus rhythm.  She also was incidentally found to have an ascending aortic aneurysm with a 4.9 cm.  She presents for follow-up today.  Problem List 1. Covid PNA 2. Pulmonary embolism 3. Atrial fibrillation -12/2019 in setting of PE 4. Ascending aortic aneurysm -4.9 cm 12/31/2019 5. Dementia   Past Medical History: Past Medical History:  Diagnosis Date  . Arthritis    "right shoulder; that's why I had to have it replaced"  . Hypertension   . Osteoarthritis   . Osteoporosis   . PMR (polymyalgia rheumatica) (HCC)     Past Surgical History: Past Surgical History:  Procedure Laterality Date  . CATARACT EXTRACTION W/ INTRAOCULAR LENS  IMPLANT, BILATERAL     2008   . HIP PINNING,CANNULATED Right 10/20/2013   Procedure: CANNULATED HIP PINNING;  Surgeon: Eldred Manges, MD;  Location: [facility];  Service: Orthopedics;  Laterality: Right;  . KYPHOPLASTY N/A 02/28/2013   Procedure: KYPHOPLASTY;  Surgeon: Cristi Loron, MD;   Location: [facility];  Service: Neurosurgery;  Laterality: N/A;  Lumbar two Kyphoplasty  . POSTERIOR FUSION LUMBAR SPINE  03/28/12  . SHOULDER ARTHROSCOPY W/ ROTATOR CUFF REPAIR  ~ 01/2011   right  . TOTAL SHOULDER REPLACEMENT  06/2011   right  . VAGINAL HYSTERECTOMY  1970's    Current Medications: No outpatient medications have been marked as taking for the 01/27/20 encounter (Appointment) with O'Neal, Ronnald Ramp, MD.     Allergies:    Patient has no known allergies.   Social History: Social History   Socioeconomic History  . Marital status: Married    Spouse name: Not on file  . Number of children: 3  . Years of education: Not on file  . Highest education level: Not on file  Occupational History  . Occupation: retired  Tobacco Use  . Smoking status: Former Smoker    Packs/day: 0.50    Years: 40.00    Pack years: 20.00    Types: Cigarettes    Quit date: 12/05/2013    Years since quitting: 6.1  . Smokeless tobacco: Never Used  Substance and Sexual Activity  . Alcohol use: Never    Alcohol/week: 0.0 standard drinks    Comment: occ  . Drug use: No  . Sexual activity: Never  Other Topics Concern  . Not on file  Social History Narrative   Retired   Married   Alcohol use-no   Drug use-no   Regular Exercise-yes   Current Smoker  Social Determinants of Health   Financial Resource Strain:   . Difficulty of Paying Living Expenses: Not on file  Food Insecurity:   . Worried About Programme researcher, broadcasting/film/video in the Last Year: Not on file  . Ran Out of Food in the Last Year: Not on file  Transportation Needs:   . Lack of Transportation (Medical): Not on file  . Lack of Transportation (Non-Medical): Not on file  Physical Activity:   . Days of Exercise per Week: Not on file  . Minutes of Exercise per Session: Not on file  Stress:   . Feeling of Stress : Not on file  Social Connections:   . Frequency of Communication with Friends and Family: Not on file  .  Frequency of Social Gatherings with Friends and Family: Not on file  . Attends Religious Services: Not on file  . Active Member of Clubs or Organizations: Not on file  . Attends Banker Meetings: Not on file  . Marital Status: Not on file     Family History: The patient's ***family history includes ALS in her father; Hypertension in her sister.  ROS:   All other ROS reviewed and negative. Pertinent positives noted in the HPI.     EKGs/Labs/Other Studies Reviewed:   The following studies were personally reviewed by me today:  EKG:  EKG is *** ordered today.  The ekg ordered today demonstrates ***, and was personally reviewed by me.   TTE 12/31/2019 1. Left ventricular ejection fraction, by visual estimation, is 60 to  65%. The left ventricle has normal function. There is no left ventricular  hypertrophy.  2. Left ventricular diastolic function could not be evaluated.  3. The left ventricle has no regional wall motion abnormalities.  4. Global right ventricle has normal systolic function.The right  ventricular size is normal. No increase in right ventricular wall  thickness.  5. Left atrial size was severely dilated.  6. Right atrial size was normal.  7. The mitral valve is normal in structure. No evidence of mitral valve  regurgitation.  8. The tricuspid valve is normal in structure.  9. The tricuspid valve is normal in structure. Tricuspid valve  regurgitation is trivial.  10. The aortic valve is tricuspid. Aortic valve regurgitation is trivial.  Mild aortic valve sclerosis without stenosis.  11. The pulmonic valve was grossly normal. Pulmonic valve regurgitation is  not visualized.  12. Normal pulmonary artery systolic pressure.  13. The inferior vena cava is normal in size with greater than 50%  respiratory variability, suggesting right atrial pressure of 3 mmHg.   Recent Labs: 12/13/2019: Magnesium 2.1 12/31/2019: TSH 1.397 01/01/2020: ALT 17; BUN 13;  Creatinine, Ser 0.59; Potassium 3.5; Sodium 134 01/03/2020: Hemoglobin 13.3; Platelets 277   Recent Lipid Panel    Component Value Date/Time   CHOL 152 08/23/2016 1008   TRIG 148.0 08/23/2016 1008   HDL 33.60 (L) 08/23/2016 1008   CHOLHDL 5 08/23/2016 1008   VLDL 29.6 08/23/2016 1008   LDLCALC 88 08/23/2016 1008    Physical Exam:   VS:  There were no vitals taken for this visit.   Wt Readings from Last 3 Encounters:  01/03/20 124 lb 12.5 oz (56.6 kg)  12/02/19 131 lb 2.8 oz (59.5 kg)  11/25/19 131 lb 2.8 oz (59.5 kg)    General: Well nourished, well developed, in no acute distress Heart: Atraumatic, normal size  Eyes: PEERLA, EOMI  Neck: Supple, no JVD Endocrine: No thryomegaly Cardiac: Normal S1,  S2; RRR; no murmurs, rubs, or gallops Lungs: Clear to auscultation bilaterally, no wheezing, rhonchi or rales  Abd: Soft, nontender, no hepatomegaly  Ext: No edema, pulses 2+ Musculoskeletal: No deformities, BUE and BLE strength normal and equal Skin: Warm and dry, no rashes   Neuro: Alert and oriented to person, place, time, and situation, CNII-XII grossly intact, no focal deficits  Psych: Normal mood and affect   ASSESSMENT:   MARSHAE AZAM is a 86 y.o. female who presents for the following: No diagnosis found.  PLAN:   There are no diagnoses linked to this encounter.  Disposition: No follow-ups on file.  Medication Adjustments/Labs and Tests Ordered: Current medicines are reviewed at length with the patient today.  Concerns regarding medicines are outlined above.  No orders of the defined types were placed in this encounter.  No orders of the defined types were placed in this encounter.   There are no Patient Instructions on file for this visit.   Signed, Addison Naegeli. Audie Box, [place]  [address] [place],  [address] [phone]  01/26/2020 3:36 PM

## 2020-01-27 ENCOUNTER — Ambulatory Visit: Payer: Medicare PPO | Admitting: Cardiovascular Disease

## 2020-02-24 ENCOUNTER — Encounter: Payer: Self-pay | Admitting: Cardiovascular Disease

## 2020-03-11 NOTE — Progress Notes (Signed)
Cardiology Office Note:   Date:  03/12/2020  NAME:  Shirley [name]    MRN: [id number] DOB:  June 19, 1934   PCP:  Roderick Pee, MD (Inactive)  Cardiologist:  Reatha Harps, MD   Referring MD: No ref. provider found   Chief Complaint  Patient presents with  . Atrial Fibrillation   History of Present Illness:   Shirley [name] is a 86 y.o. female with a hx of dementia, thoracic aortic aneurysm, PE (2/2 covid), Afib who presents for follow-up of atrial fibrillation. Admitted 1/25-1/29/2021 for Afib with RVR after covid 19 PNA. Was found to have pulmonary emboi which likely resulted in her Afib. She was started on eliquis and amiodarone. Follow-up today. Also, had thoracic aortic aneurysm up 49 mm.   She presents today with her caretaker.  They report no issues at her memory care unit.  Her EKG shows normal sinus rhythm.  She is still on amiodarone.  We will plan to stop that today.  She denies any chest pain or shortness of breath today.  There is no lower extremity edema.  She does appear to be confused today but this is her baseline how she was in the hospital.  Problem List 1. Afib 2/2 PE 12/2019 -AC and amiodarone recommended due to dementia  -03/12/20 back in [facility] 2. Pulmonary embolism 3. Dementia 4. Ascending aortic aneurysm 4.9 cm 12/2019 5. Descending aortic aneurysm 4.3 cm   Past Medical History: Past Medical History:  Diagnosis Date  . Arthritis    "right shoulder; that's why I had to have it replaced"  . Hypertension   . Osteoarthritis   . Osteoporosis   . PMR (polymyalgia rheumatica) (HCC)     Past Surgical History: Past Surgical History:  Procedure Laterality Date  . CATARACT EXTRACTION W/ INTRAOCULAR LENS  IMPLANT, BILATERAL     2008   . HIP PINNING,CANNULATED Right 10/20/2013   Procedure: CANNULATED HIP PINNING;  Surgeon: Eldred Manges, MD;  Location: [facility];  Service: Orthopedics;  Laterality: Right;  . KYPHOPLASTY N/A 02/28/2013   Procedure: KYPHOPLASTY;   Surgeon: Cristi Loron, MD;  Location: [facility];  Service: Neurosurgery;  Laterality: N/A;  Lumbar two Kyphoplasty  . POSTERIOR FUSION LUMBAR SPINE  03/28/12  . SHOULDER ARTHROSCOPY W/ ROTATOR CUFF REPAIR  ~ 01/2011   right  . TOTAL SHOULDER REPLACEMENT  06/2011   right  . VAGINAL HYSTERECTOMY  1970's    Current Medications: Current Meds  Medication Sig  . albuterol (VENTOLIN HFA) 108 (90 Base) MCG/ACT inhaler Inhale 2 puffs into the lungs every 4 (four) hours as needed for wheezing or shortness of breath.  Marland Kitchen apixaban (ELIQUIS) 5 MG TABS tablet Take 2 tablets (10 mg total) by mouth 2 (two) times daily. Take 10 mg twice a day for 6 days  . apixaban (ELIQUIS) 5 MG TABS tablet Take 1 tablet (5 mg total) by mouth 2 (two) times daily.  Marland Kitchen ascorbic acid (VITAMIN C) 500 MG tablet Take 500 mg by mouth 2 (two) times daily.  . Cholecalciferol (VITAMIN D-3) 25 MCG (1000 UT) CAPS Take 1,000 Units by mouth daily.  . citalopram (CELEXA) 10 MG tablet TAKE 2 TABLETS EVERY DAY  . donepezil (ARICEPT) 10 MG tablet TAKE 1 TABLET EVERY DAY  . metoprolol tartrate (LOPRESSOR) 50 MG tablet Take 1 tablet (50 mg total) by mouth 2 (two) times daily.  . Skin Protectants, Misc. (MINERIN CREME) CREA Apply 1 application topically every 12 (twelve) hours as  needed (to affected area(s)- for rashes).   . triamcinolone cream (KENALOG) 0.1 % Compound 1;1 with Eucerin cream and apply bid prn rash.  . vitamin B-12 (CYANOCOBALAMIN) 1000 MCG tablet Take 2 tablets (2,000 mcg total) by mouth daily.  . Zinc 100 MG TABS Take 100 mg by mouth daily.  . [DISCONTINUED] amiodarone (PACERONE) 200 MG tablet Take 1 tablet (200 mg total) by mouth daily. Start on 01/09/2020 2 of the tablets are done.  . [DISCONTINUED] amiodarone (PACERONE) 400 MG tablet Take 1 tablet (400 mg total) by mouth 2 (two) times daily. Take twice a day for 6 days     Allergies:    Patient has no known allergies.   Social History: Social History    Socioeconomic History  . Marital status: Married    Spouse name: Not on file  . Number of children: 3  . Years of education: Not on file  . Highest education level: Not on file  Occupational History  . Occupation: retired  Tobacco Use  . Smoking status: Former Smoker    Packs/day: 0.50    Years: 40.00    Pack years: 20.00    Types: Cigarettes    Quit date: 12/05/2013    Years since quitting: 6.2  . Smokeless tobacco: Never Used  Substance and Sexual Activity  . Alcohol use: Never    Alcohol/week: 0.0 standard drinks    Comment: occ  . Drug use: No  . Sexual activity: Never  Other Topics Concern  . Not on file  Social History Narrative   Retired   Married   Alcohol use-no   Drug use-no   Regular Exercise-yes   Current Smoker         Social Determinants of Corporate investment banker Strain:   . Difficulty of Paying Living Expenses:   Food Insecurity:   . Worried About Programme researcher, broadcasting/film/video in the Last Year:   . Barista in the Last Year:   Transportation Needs:   . Freight forwarder (Medical):   Marland Kitchen Lack of Transportation (Non-Medical):   Physical Activity:   . Days of Exercise per Week:   . Minutes of Exercise per Session:   Stress:   . Feeling of Stress :   Social Connections:   . Frequency of Communication with Friends and Family:   . Frequency of Social Gatherings with Friends and Family:   . Attends Religious Services:   . Active Member of Clubs or Organizations:   . Attends Banker Meetings:   Marland Kitchen Marital Status:      Family History: The patient's family history includes ALS in her father; Hypertension in her sister.  ROS:   All other ROS reviewed and negative. Pertinent positives noted in the HPI.     EKGs/Labs/Other Studies Reviewed:   The following studies were personally reviewed by me today:  EKG:  EKG is ordered today.  The ekg ordered today demonstrates normal sinus rhythm, heart rate 66, no acute ischemic changes,  no evidence of prior infarction, and was personally reviewed by me.   Recent Labs: 12/13/2019: Magnesium 2.1 12/31/2019: TSH 1.397 01/01/2020: ALT 17; BUN 13; Creatinine, Ser 0.59; Potassium 3.5; Sodium 134 01/03/2020: Hemoglobin 13.3; Platelets 277   Recent Lipid Panel    Component Value Date/Time   CHOL 152 08/23/2016 1008   TRIG 148.0 08/23/2016 1008   HDL 33.60 (L) 08/23/2016 1008   CHOLHDL 5 08/23/2016 1008   VLDL 29.6 08/23/2016 1008  LDLCALC 88 08/23/2016 1008    Physical Exam:   VS:  BP 140/80   Ht 5\' 3"  (1.6 m)   Wt 124 lb (56.2 kg)   SpO2 95%   BMI 21.97 kg/m    Wt Readings from Last 3 Encounters:  03/12/20 124 lb (56.2 kg)  01/03/20 124 lb 12.5 oz (56.6 kg)  12/02/19 131 lb 2.8 oz (59.5 kg)    General: Well nourished, well developed, in no acute distress Heart: Atraumatic, normal size  Eyes: PEERLA, EOMI  Neck: Supple, no JVD Endocrine: No thryomegaly Cardiac: Normal S1, S2; RRR; no murmurs, rubs, or gallops Lungs: Diminished breath sounds bilaterally Abd: Soft, nontender, no hepatomegaly  Ext: No edema, pulses 2+ Musculoskeletal: No deformities, BUE and BLE strength normal and equal Skin: Warm and dry, no rashes   Neuro: Alert and oriented to person, place, time, and situation, CNII-XII grossly intact, no focal deficits  Psych: Normal mood and affect   ASSESSMENT:   LULIA SCHRINER is a 86 y.o. female who presents for the following: 1. Persistent atrial fibrillation (HCC)   2. Ascending aortic aneurysm (HCC)   3. Pulmonary embolism without acute cor pulmonale, unspecified chronicity, unspecified pulmonary embolism type (HCC)     PLAN:   1. Persistent atrial fibrillation (HCC) -Had atrial fibrillation secondary to coronavirus and pulmonary embolism January 2021. -Due to advanced dementia she was placed on amiodarone and Eliquis.  She is back in normal sinus rhythm today.  We will stop her amiodarone. -She will continue Eliquis 5 mg twice daily  (CHADSVASC=3).  No bleeding issues reported.  She will need to be anticoagulated for least 6 months for pulmonary embolism.  Should she have any bleeding issues we can easily stop this medication.  I think for now since she is tolerating well we will continue it.  2. Ascending aortic aneurysm (HCC) -49 mm on CTA 12/2019 -Given her advanced dementia I really do not think we should continue to survey this.  She was a DNR in the hospital.  I will be my recommendation.  We will just follow her clinically.  3. Pulmonary embolism without acute cor pulmonale, unspecified chronicity, unspecified pulmonary embolism type [facility]) -Diagnosed January 2021 in the setting of coronavirus pneumonia. Remain on eliquis. No issues.   Disposition: Return in about 6 months (around 09/11/2020).  Medication Adjustments/Labs and Tests Ordered: Current medicines are reviewed at length with the patient today.  Concerns regarding medicines are outlined above.  Orders Placed This Encounter  Procedures  . EKG 12-Lead   No orders of the defined types were placed in this encounter.   Patient Instructions  Medication Instructions:  Stop Amiodarone   *If you need a refill on your cardiac medications before your next appointment, please call your pharmacy*   Follow-Up: At [facility], you and your health needs are our priority.  As part of our continuing mission to provide you with exceptional heart care, we have created designated Provider Care Teams.  These Care Teams include your primary Cardiologist (physician) and Advanced Practice Providers (APPs -  Physician Assistants and Nurse Practitioners) who all work together to provide you with the care you need, when you need it.  We recommend signing up for the patient portal called "MyChart".  Sign up information is provided on this After Visit Summary.  MyChart is used to connect with patients for Virtual Visits (Telemedicine).  Patients are able to view lab/test  results, encounter notes, upcoming appointments, etc.  Non-urgent messages can  be sent to your provider as well.   To learn more about what you can do with MyChart, go to [email or web address].    Your next appointment:   6 month(s)  The format for your next appointment:   In Person  Provider:   Eleonore Chiquito, MD       Time Spent with Patient: I have spent a total of 35 minutes with patient reviewing hospital notes, telemetry, EKGs, labs and examining the patient as well as establishing an assessment and plan that was discussed with the patient.  > 50% of time was spent in direct patient care.  Signed, Addison Naegeli. Audie Box, [place]  [address] [place], [place] [address] [phone]  03/12/2020 12:23 PM

## 2020-03-12 ENCOUNTER — Ambulatory Visit (INDEPENDENT_AMBULATORY_CARE_PROVIDER_SITE_OTHER): Payer: Medicare PPO | Admitting: Cardiovascular Disease

## 2020-03-12 ENCOUNTER — Encounter: Payer: Self-pay | Admitting: Cardiovascular Disease

## 2020-03-12 ENCOUNTER — Other Ambulatory Visit: Payer: Self-pay

## 2020-03-12 VITALS — BP 140/80 | Ht 63.0 in | Wt 124.0 lb

## 2020-03-12 DIAGNOSIS — I712 Thoracic aortic aneurysm, without rupture: Secondary | ICD-10-CM | POA: Diagnosis not present

## 2020-03-12 DIAGNOSIS — I2699 Other pulmonary embolism without acute cor pulmonale: Secondary | ICD-10-CM | POA: Diagnosis not present

## 2020-03-12 DIAGNOSIS — I4819 Other persistent atrial fibrillation: Secondary | ICD-10-CM | POA: Diagnosis not present

## 2020-03-12 DIAGNOSIS — I7121 Aneurysm of the ascending aorta, without rupture: Secondary | ICD-10-CM

## 2020-03-12 NOTE — Patient Instructions (Signed)
Medication Instructions:  Stop Amiodarone   *If you need a refill on your cardiac medications before your next appointment, please call your pharmacy*    Follow-Up: At [facility], you and your health needs are our priority.  As part of our continuing mission to provide you with exceptional heart care, we have created designated Provider Care Teams.  These Care Teams include your primary Cardiologist (physician) and Advanced Practice Providers (APPs -  Physician Assistants and Nurse Practitioners) who all work together to provide you with the care you need, when you need it.  We recommend signing up for the patient portal called "MyChart".  Sign up information is provided on this After Visit Summary.  MyChart is used to connect with patients for Virtual Visits (Telemedicine).  Patients are able to view lab/test results, encounter notes, upcoming appointments, etc.  Non-urgent messages can be sent to your provider as well.   To learn more about what you can do with MyChart, go to [email or web address].    Your next appointment:   6 month(s)  The format for your next appointment:   In Person  Provider:   Christopher [place] O'Neal, MD      

## 2020-03-15 ENCOUNTER — Other Ambulatory Visit: Payer: Self-pay

## 2020-03-15 ENCOUNTER — Emergency Department (HOSPITAL_COMMUNITY): Payer: Medicare PPO

## 2020-03-15 ENCOUNTER — Inpatient Hospital Stay (HOSPITAL_COMMUNITY)
Admission: EM | Admit: 2020-03-15 | Discharge: 2020-03-18 | DRG: 871 | Disposition: A | Discharge status: Skilled Nursing Facility | Payer: Medicare PPO | Source: Skilled Nursing Facility | Attending: Internal Medicine | Admitting: Internal Medicine

## 2020-03-15 ENCOUNTER — Encounter (HOSPITAL_COMMUNITY): Payer: Self-pay | Admitting: Emergency Medicine

## 2020-03-15 DIAGNOSIS — L8990 Pressure ulcer of unspecified site, unspecified stage: Secondary | ICD-10-CM | POA: Insufficient documentation

## 2020-03-15 DIAGNOSIS — Z8701 Personal history of pneumonia (recurrent): Secondary | ICD-10-CM

## 2020-03-15 DIAGNOSIS — E860 Dehydration: Secondary | ICD-10-CM | POA: Diagnosis present

## 2020-03-15 DIAGNOSIS — N390 Urinary tract infection, site not specified: Secondary | ICD-10-CM | POA: Diagnosis present

## 2020-03-15 DIAGNOSIS — Z86711 Personal history of pulmonary embolism: Secondary | ICD-10-CM

## 2020-03-15 DIAGNOSIS — Z66 Do not resuscitate: Secondary | ICD-10-CM | POA: Diagnosis present

## 2020-03-15 DIAGNOSIS — I10 Essential (primary) hypertension: Secondary | ICD-10-CM | POA: Diagnosis present

## 2020-03-15 DIAGNOSIS — A419 Sepsis, unspecified organism: Secondary | ICD-10-CM | POA: Diagnosis present

## 2020-03-15 DIAGNOSIS — R4182 Altered mental status, unspecified: Secondary | ICD-10-CM | POA: Diagnosis not present

## 2020-03-15 DIAGNOSIS — M810 Age-related osteoporosis without current pathological fracture: Secondary | ICD-10-CM | POA: Diagnosis present

## 2020-03-15 DIAGNOSIS — Z8249 Family history of ischemic heart disease and other diseases of the circulatory system: Secondary | ICD-10-CM | POA: Diagnosis not present

## 2020-03-15 DIAGNOSIS — I2699 Other pulmonary embolism without acute cor pulmonale: Secondary | ICD-10-CM | POA: Diagnosis not present

## 2020-03-15 DIAGNOSIS — G9341 Metabolic encephalopathy: Secondary | ICD-10-CM | POA: Diagnosis present

## 2020-03-15 DIAGNOSIS — J329 Chronic sinusitis, unspecified: Secondary | ICD-10-CM | POA: Diagnosis present

## 2020-03-15 DIAGNOSIS — E876 Hypokalemia: Secondary | ICD-10-CM | POA: Diagnosis not present

## 2020-03-15 DIAGNOSIS — F0390 Unspecified dementia without behavioral disturbance: Secondary | ICD-10-CM | POA: Diagnosis present

## 2020-03-15 DIAGNOSIS — Z87891 Personal history of nicotine dependence: Secondary | ICD-10-CM | POA: Diagnosis not present

## 2020-03-15 DIAGNOSIS — A4151 Sepsis due to Escherichia coli [E. coli]: Secondary | ICD-10-CM | POA: Diagnosis not present

## 2020-03-15 DIAGNOSIS — Z96611 Presence of right artificial shoulder joint: Secondary | ICD-10-CM | POA: Diagnosis present

## 2020-03-15 DIAGNOSIS — M353 Polymyalgia rheumatica: Secondary | ICD-10-CM | POA: Diagnosis present

## 2020-03-15 DIAGNOSIS — Z8616 Personal history of COVID-19: Secondary | ICD-10-CM

## 2020-03-15 LAB — COMPREHENSIVE METABOLIC PANEL
ALT: 20 U/L (ref 0–44)
AST: 27 U/L (ref 15–41)
Albumin: 3.3 g/dL — ABNORMAL LOW (ref 3.5–5.0)
Alkaline Phosphatase: 73 U/L (ref 38–126)
Anion gap: 15 (ref 5–15)
BUN: 12 mg/dL (ref 8–23)
CO2: 22 mmol/L (ref 22–32)
Calcium: 8.9 mg/dL (ref 8.9–10.3)
Chloride: 98 mmol/L (ref 98–111)
Creatinine, Ser: 0.84 mg/dL (ref 0.44–1.00)
GFR calc Af Amer: >60 mL/min (ref >60)
GFR calc non Af Amer: >60 mL/min (ref >60)
Glucose, Bld: 120 mg/dL — ABNORMAL HIGH (ref 70–99)
Potassium: 4.5 mmol/L (ref 3.5–5.1)
Sodium: 135 mmol/L (ref 135–145)
Total Bilirubin: 1 mg/dL (ref 0.3–1.2)
Total Protein: 7.7 g/dL (ref 6.5–8.1)

## 2020-03-15 LAB — URINALYSIS, ROUTINE W REFLEX MICROSCOPIC
Bilirubin Urine: NEGATIVE
Glucose, UA: NEGATIVE mg/dL
Ketones, ur: NEGATIVE mg/dL
Nitrite: POSITIVE — AB
Protein, ur: 100 mg/dL — AB
RBC / HPF: >50 RBC/hpf — ABNORMAL HIGH (ref 0–5)
Specific Gravity, Urine: 1.014 (ref 1.005–1.030)
pH: 5 (ref 5.0–8.0)

## 2020-03-15 LAB — CBC WITH DIFFERENTIAL/PLATELET
Abs Immature Granulocytes: 0.1 10*3/uL — ABNORMAL HIGH (ref 0.00–0.07)
Basophils Absolute: 0.1 10*3/uL (ref 0.0–0.1)
Basophils Relative: 0 %
Eosinophils Absolute: 0 10*3/uL (ref 0.0–0.5)
Eosinophils Relative: 0 %
HCT: 46.7 % — ABNORMAL HIGH (ref 36.0–46.0)
Hemoglobin: 14.6 g/dL (ref 12.0–15.0)
Immature Granulocytes: 1 %
Lymphocytes Relative: 5 %
Lymphs Abs: 0.9 10*3/uL (ref 0.7–4.0)
MCH: 29.9 pg (ref 26.0–34.0)
MCHC: 31.3 g/dL (ref 30.0–36.0)
MCV: 95.7 fL (ref 80.0–100.0)
Monocytes Absolute: 1.7 10*3/uL — ABNORMAL HIGH (ref 0.1–1.0)
Monocytes Relative: 9 %
Neutro Abs: 15.7 10*3/uL — ABNORMAL HIGH (ref 1.7–7.7)
Neutrophils Relative %: 85 %
Platelets: 319 10*3/uL (ref 150–400)
RBC: 4.88 MIL/uL (ref 3.87–5.11)
RDW: 14.6 % (ref 11.5–15.5)
WBC: 18.5 10*3/uL — ABNORMAL HIGH (ref 4.0–10.5)
nRBC: 0 % (ref 0.0–0.2)

## 2020-03-15 LAB — PROTIME-INR
INR: 1.3 — ABNORMAL HIGH (ref 0.8–1.2)
Prothrombin Time: 15.8 seconds — ABNORMAL HIGH (ref 11.4–15.2)

## 2020-03-15 LAB — CBG MONITORING, ED: Glucose-Capillary: 117 mg/dL — ABNORMAL HIGH (ref 70–99)

## 2020-03-15 LAB — LACTIC ACID, PLASMA
Lactic Acid, Venous: 3.7 mmol/L (ref 0.5–1.9)
Lactic Acid, Venous: 1.6 mmol/L (ref 0.5–1.9)

## 2020-03-15 LAB — POC SARS CORONAVIRUS 2 AG -  ED: SARS Coronavirus 2 Ag: NEGATIVE

## 2020-03-15 LAB — SARS CORONAVIRUS 2 (TAT 6-24 HRS): SARS Coronavirus 2: NEGATIVE

## 2020-03-15 LAB — MRSA PCR SCREENING: MRSA by PCR: NEGATIVE

## 2020-03-15 LAB — APTT: aPTT: 30 seconds (ref 24–36)

## 2020-03-15 MED ORDER — ASCORBIC ACID 500 MG PO TABS
500.0000 mg | ORAL_TABLET | Freq: Two times a day (BID) | ORAL | Status: DC
  Administered 2020-03-15 – 2020-03-18 (×6): 500 mg via ORAL
  Filled 2020-03-15 (×7): qty 1

## 2020-03-15 MED ORDER — VANCOMYCIN HCL IN DEXTROSE 1-5 GM/200ML-% IV SOLN
1000.0000 mg | Freq: Once | INTRAVENOUS | Status: AC
  Administered 2020-03-15: 1000 mg via INTRAVENOUS
  Filled 2020-03-15: qty 200

## 2020-03-15 MED ORDER — ACETAMINOPHEN 650 MG RE SUPP: 650.0000 mg | Freq: Four times a day (QID) | RECTAL | Status: DC | PRN

## 2020-03-15 MED ORDER — SODIUM CHLORIDE 0.9 % IV SOLN: 2.0000 g | Freq: Two times a day (BID) | INTRAVENOUS | Status: DC

## 2020-03-15 MED ORDER — SODIUM CHLORIDE 0.9 % IV SOLN
2.0000 g | Freq: Once | INTRAVENOUS | Status: AC
  Administered 2020-03-15: 2 g via INTRAVENOUS
  Filled 2020-03-15: qty 2

## 2020-03-15 MED ORDER — METOPROLOL TARTRATE 5 MG/5ML IV SOLN
2.5000 mg | Freq: Four times a day (QID) | INTRAVENOUS | Status: DC | PRN
  Administered 2020-03-15: 2.5 mg via INTRAVENOUS
  Filled 2020-03-15: qty 5

## 2020-03-15 MED ORDER — ACETAMINOPHEN 650 MG RE SUPP
650.0000 mg | Freq: Once | RECTAL | Status: AC
  Administered 2020-03-15: 650 mg via RECTAL
  Filled 2020-03-15: qty 1

## 2020-03-15 MED ORDER — SODIUM CHLORIDE 0.9 % IV BOLUS (SEPSIS)
1000.0000 mL | Freq: Once | INTRAVENOUS | Status: AC
  Administered 2020-03-15: 1000 mL via INTRAVENOUS

## 2020-03-15 MED ORDER — MINERIN CREME EX CREA: 1.0000 "application " | TOPICAL_CREAM | Freq: Two times a day (BID) | CUTANEOUS | Status: DC | PRN

## 2020-03-15 MED ORDER — VITAMIN B-12 1000 MCG PO TABS
2000.0000 ug | ORAL_TABLET | Freq: Every day | ORAL | Status: DC
  Administered 2020-03-15 – 2020-03-18 (×3): 2000 ug via ORAL
  Filled 2020-03-15 (×4): qty 2

## 2020-03-15 MED ORDER — DONEPEZIL HCL 5 MG PO TABS
10.0000 mg | ORAL_TABLET | Freq: Every day | ORAL | Status: DC
  Administered 2020-03-15 – 2020-03-17 (×3): 10 mg via ORAL
  Filled 2020-03-15 (×2): qty 2
  Filled 2020-03-15: qty 1
  Filled 2020-03-15: qty 2

## 2020-03-15 MED ORDER — ACETAMINOPHEN 325 MG PO TABS: 650.0000 mg | ORAL_TABLET | Freq: Once | ORAL | Status: DC

## 2020-03-15 MED ORDER — SODIUM CHLORIDE 0.9 % IV SOLN: INTRAVENOUS | Status: DC

## 2020-03-15 MED ORDER — SODIUM CHLORIDE 0.9 % IV SOLN
1.0000 g | INTRAVENOUS | Status: DC
  Administered 2020-03-15 – 2020-03-17 (×3): 1 g via INTRAVENOUS
  Filled 2020-03-15 (×2): qty 10
  Filled 2020-03-15 (×2): qty 1

## 2020-03-15 MED ORDER — SODIUM CHLORIDE 0.9 % IV BOLUS (SEPSIS)
500.0000 mL | Freq: Once | INTRAVENOUS | Status: AC
  Administered 2020-03-15: 500 mL via INTRAVENOUS

## 2020-03-15 MED ORDER — ACETAMINOPHEN 325 MG PO TABS
650.0000 mg | ORAL_TABLET | Freq: Four times a day (QID) | ORAL | Status: DC | PRN
  Administered 2020-03-15: 650 mg via ORAL
  Filled 2020-03-15: qty 2

## 2020-03-15 MED ORDER — VANCOMYCIN HCL 750 MG/150ML IV SOLN: 750.0000 mg | INTRAVENOUS | Status: DC

## 2020-03-15 MED ORDER — VITAMIN D 25 MCG (1000 UNIT) PO TABS
1000.0000 [IU] | ORAL_TABLET | Freq: Every day | ORAL | Status: DC
  Administered 2020-03-15 – 2020-03-18 (×3): 1000 [IU] via ORAL
  Filled 2020-03-15 (×4): qty 1

## 2020-03-15 MED ORDER — SODIUM CHLORIDE 0.9 % IV SOLN
500.0000 mg | Freq: Once | INTRAVENOUS | Status: AC
  Administered 2020-03-15: 500 mg via INTRAVENOUS
  Filled 2020-03-15: qty 500

## 2020-03-15 MED ORDER — CITALOPRAM HYDROBROMIDE 20 MG PO TABS
20.0000 mg | ORAL_TABLET | Freq: Every day | ORAL | Status: DC
  Administered 2020-03-15: 20 mg via ORAL
  Filled 2020-03-15: qty 1

## 2020-03-15 MED ORDER — ALBUTEROL SULFATE (2.5 MG/3ML) 0.083% IN NEBU: 2.5000 mg | INHALATION_SOLUTION | RESPIRATORY_TRACT | Status: DC | PRN

## 2020-03-15 MED ORDER — APIXABAN 2.5 MG PO TABS
2.5000 mg | ORAL_TABLET | Freq: Two times a day (BID) | ORAL | Status: DC
  Administered 2020-03-15 (×2): 2.5 mg via ORAL
  Filled 2020-03-15 (×4): qty 1

## 2020-03-15 MED ORDER — SODIUM CHLORIDE 0.9 % IV BOLUS (SEPSIS)
250.0000 mL | Freq: Once | INTRAVENOUS | Status: AC
  Administered 2020-03-15: 250 mL via INTRAVENOUS

## 2020-03-15 MED ORDER — FLUTICASONE PROPIONATE 50 MCG/ACT NA SUSP
2.0000 | Freq: Every day | NASAL | Status: DC
  Administered 2020-03-16 – 2020-03-18 (×2): 2 via NASAL
  Filled 2020-03-15: qty 16

## 2020-03-15 MED ORDER — TRIAMCINOLONE ACETONIDE 0.1 % EX CREA
TOPICAL_CREAM | Freq: Two times a day (BID) | CUTANEOUS | Status: DC | PRN
  Filled 2020-03-15: qty 15

## 2020-03-15 MED ORDER — ZINC SULFATE 220 (50 ZN) MG PO CAPS
220.0000 mg | ORAL_CAPSULE | Freq: Every day | ORAL | Status: DC
  Administered 2020-03-15 – 2020-03-18 (×3): 220 mg via ORAL
  Filled 2020-03-15 (×4): qty 1

## 2020-03-15 MED ORDER — BACID PO TABS
2.0000 | ORAL_TABLET | Freq: Three times a day (TID) | ORAL | Status: DC
  Administered 2020-03-15 – 2020-03-18 (×7): 2 via ORAL
  Filled 2020-03-15 (×13): qty 2

## 2020-03-15 NOTE — Progress Notes (Signed)
Pharmacy Antibiotic Note  Shirley [name] is a 86 y.o. female admitted on 03/15/2020 with pneumonia.  Pharmacy has been consulted for Cefepime and Vancomycin dosing.   Height: 5\' 3"  (160 cm) Weight: 56.2 kg (123 lb 14.4 oz) IBW/kg (Calculated) : 52.4  Temp (24hrs), Avg:101.7 F (38.7 C), Min:101.7 F (38.7 C), Max:101.7 F (38.7 C)  Recent Labs  Lab 03/15/20 1038  WBC 18.5*  CREATININE 0.84  LATICACIDVEN 3.7*    Estimated Creatinine Clearance: 41.2 mL/min (by C-G formula based on SCr of 0.84 mg/dL).    No Known Allergies  Antimicrobials this admission: 4/11 Cefepime >>  4/11 Vancomycin >>   Dose adjustments this admission: N/a  Microbiology results: Pending   Plan:  - Cefepime 2g IV q12h - Vancomycin 1000mg  IV x 1 dose  - Followed by Vancomycin 750mg  IV q24h - Est Calc AUC 479 - Monitor patients renal function and urine output  - De-escalate ABX when appropriate   Thank you for allowing pharmacy to be a part of this patient's care.  6/11 PharmD. BCPS 03/15/2020 11:43 AM

## 2020-03-15 NOTE — ED Triage Notes (Addendum)
Patient presents from [facility] via ems c/c Altered mental status since today morning. Patient with fever of 101.8 PR on arrival to ED. HR 84, RR 16 unlabored, SpO2 97 room air.

## 2020-03-15 NOTE — Progress Notes (Signed)
Pt 's daughter TODD, would like to be updated by the MD.

## 2020-03-15 NOTE — Progress Notes (Signed)
Clarified with Tawanna Cooler Reckner son that patient is a DNR

## 2020-03-15 NOTE — ED Provider Notes (Signed)
[facility] EMERGENCY DEPARTMENT Provider Note   CSN: [id number] Arrival date & time: 03/15/20  1004     History Chief Complaint  Patient presents with  . Altered Mental Status    Shirley [name] is a 86 y.o. female.  The history is provided by the nursing home and the EMS personnel. The history is limited by the condition of the patient.  Altered Mental Status Presenting symptoms: behavior changes and partial responsiveness   Severity:  Severe Most recent episode:  Today Progression:  Worsening Chronicity:  New Context: nursing home resident   Associated symptoms: fever   Pt reported to less responsive per nursing home.  EMS called when pt quit talking.  Pt has a fever and is coughing.      Past Medical History:  Diagnosis Date  . Arthritis    "right shoulder; that's why I had to have it replaced"  . Hypertension   . Osteoarthritis   . Osteoporosis   . PMR (polymyalgia rheumatica) [facility])     Patient Active Problem List   Diagnosis Date Noted  . New onset atrial fibrillation (HCC) 12/31/2019  . Pneumonia 12/31/2019  . Dementia with behavioral disturbance (HCC)   . Pneumonia due to COVID-19 virus 12/03/2019  . Hypernatremia   . HCAP (healthcare-associated pneumonia) 12/02/2019  . Palliative care by specialist   . Goals of care, counseling/discussion   . DNR (do not resuscitate)   . Acute encephalopathy 11/23/2019  . Ascending aortic aneurysm (HCC) 11/23/2019  . Moderate dementia (HCC) 08/10/2015  . Frequent falls 05/08/2015  . Anxiety 05/08/2015  . Anxiety state, unspecified 03/06/2014  . Short-term memory loss 12/30/2013  . Lumbar degenerative disc disease 08/22/2011  . Polymyalgia rheumatica (HCC) 05/16/2008  . Essential hypertension 03/12/2008  . Arthritis, senescent 03/12/2008  . MYALGIA 03/12/2008  . OSTEOPOROSIS 03/12/2008    Past Surgical History:  Procedure Laterality Date  . CATARACT EXTRACTION W/ INTRAOCULAR LENS  IMPLANT,  BILATERAL     2008   . HIP PINNING,CANNULATED Right 10/20/2013   Procedure: CANNULATED HIP PINNING;  Surgeon: Eldred Manges, MD;  Location: [facility];  Service: Orthopedics;  Laterality: Right;  . KYPHOPLASTY N/A 02/28/2013   Procedure: KYPHOPLASTY;  Surgeon: Cristi Loron, MD;  Location: [facility];  Service: Neurosurgery;  Laterality: N/A;  Lumbar two Kyphoplasty  . POSTERIOR FUSION LUMBAR SPINE  03/28/12  . SHOULDER ARTHROSCOPY W/ ROTATOR CUFF REPAIR  ~ 01/2011   right  . TOTAL SHOULDER REPLACEMENT  06/2011   right  . VAGINAL HYSTERECTOMY  1970's     OB History    Gravida  3   Para      Term      Preterm      AB      Living        SAB      TAB      Ectopic      Multiple      Live Births              Family History  Problem Relation Age of Onset  . ALS Father   . Hypertension Sister     Social History   Tobacco Use  . Smoking status: Former Smoker    Packs/day: 0.50    Years: 40.00    Pack years: 20.00    Types: Cigarettes    Quit date: 12/05/2013    Years since quitting: 6.2  . Smokeless tobacco: Never Used  Substance  Use Topics  . Alcohol use: Never    Alcohol/week: 0.0 standard drinks    Comment: occ  . Drug use: No    Home Medications Prior to Admission medications   Medication Sig Start Date End Date Taking? Authorizing Provider  albuterol (VENTOLIN HFA) 108 (90 Base) MCG/ACT inhaler Inhale 2 puffs into the lungs every 4 (four) hours as needed for wheezing or shortness of breath. 01/03/20   Alwyn Ren, MD  apixaban (ELIQUIS) 5 MG TABS tablet Take 2 tablets (10 mg total) by mouth 2 (two) times daily. Take 10 mg twice a day for 6 days 01/03/20   Alwyn Ren, MD  apixaban (ELIQUIS) 5 MG TABS tablet Take 1 tablet (5 mg total) by mouth 2 (two) times daily. 01/09/20   Alwyn Ren, MD  ascorbic acid (VITAMIN C) 500 MG tablet Take 500 mg by mouth 2 (two) times daily.    [provider]  Cholecalciferol (VITAMIN D-3)  25 MCG (1000 UT) CAPS Take 1,000 Units by mouth daily.    [provider]  citalopram (CELEXA) 10 MG tablet TAKE 2 TABLETS EVERY DAY 04/17/18   Roderick Pee, MD  donepezil (ARICEPT) 10 MG tablet TAKE 1 TABLET EVERY DAY 11/29/17   Van Clines, MD  metoprolol tartrate (LOPRESSOR) 50 MG tablet Take 1 tablet (50 mg total) by mouth 2 (two) times daily. 01/03/20   Alwyn Ren, MD  Skin Protectants, Misc. (MINERIN CREME) CREA Apply 1 application topically every 12 (twelve) hours as needed (to affected area(s)- for rashes).     [provider]  triamcinolone cream (KENALOG) 0.1 % Compound 1;1 with Eucerin cream and apply bid prn rash. 04/25/18   Burchette, Elberta Fortis, MD  vitamin B-12 (CYANOCOBALAMIN) 1000 MCG tablet Take 2 tablets (2,000 mcg total) by mouth daily. 11/28/19   Gwenyth Bender, NP  Zinc 100 MG TABS Take 100 mg by mouth daily.    [provider]    Allergies    Patient has no known allergies.  Review of Systems   Review of Systems  Unable to perform ROS: Mental status change  Constitutional: Positive for fever.  Respiratory: Positive for cough.   All other systems reviewed and are negative.   Physical Exam Updated Vital Signs SpO2 100%   Physical Exam Vitals and nursing note reviewed.  Constitutional:      Appearance: She is well-developed. She is ill-appearing.  HENT:     Head: Normocephalic.     Nose: Rhinorrhea present.     Mouth/Throat:     Mouth: Mucous membranes are moist.  Cardiovascular:     Rate and Rhythm: Tachycardia present.  Pulmonary:     Breath sounds: Rhonchi present.  Abdominal:     Palpations: Abdomen is soft.  Neurological:     Mental Status: She is disoriented.     ED Results / Procedures / Treatments   Labs (all labs ordered are listed, but only abnormal results are displayed) Labs Reviewed  CULTURE, BLOOD (ROUTINE X 2)  CULTURE, BLOOD (ROUTINE X 2)  URINE CULTURE  LACTIC ACID, PLASMA  LACTIC ACID,  PLASMA  COMPREHENSIVE METABOLIC PANEL  CBC WITH DIFFERENTIAL/PLATELET  APTT  PROTIME-INR  URINALYSIS, ROUTINE W REFLEX MICROSCOPIC    EKG None  Radiology No results found.  Procedures Procedures (including critical care time)  Medications Ordered in ED Medications  sodium chloride 0.9 % bolus 1,000 mL (has no administration in time range)    And  sodium  chloride 0.9 % bolus 500 mL (has no administration in time range)    And  sodium chloride 0.9 % bolus 250 mL (has no administration in time range)  vancomycin (VANCOCIN) IVPB 1000 mg/200 mL premix (has no administration in time range)  ceFEPIme (MAXIPIME) 2 g in sodium chloride 0.9 % 100 mL IVPB (has no administration in time range)  azithromycin (ZITHROMAX) 500 mg in sodium chloride 0.9 % 250 mL IVPB (has no administration in time range)    ED Course  I have reviewed the triage vital signs and the nursing notes.  Pertinent labs & imaging results that were available during my care of the patient were reviewed by me and considered in my medical decision making (see chart for details).    MDM Rules/Calculators/A&P                      MDM:  Pt given Iv fluids and antibiotics per sepsis protocol.   MDM Number of Diagnoses or Management Options Altered mental status, unspecified altered mental status type: new, needed workup Sepsis, due to unspecified organism, unspecified whether acute organ dysfunction present [facility]): new, needed workup Urinary tract infection without hematuria, site unspecified: new, needed workup   Amount and/or Complexity of Data Reviewed Clinical lab tests: ordered and reviewed Tests in the radiology section of CPT: ordered and reviewed Tests in the medicine section of CPT: ordered and reviewed Discussion of test results with the performing providers: yes Decide to obtain previous medical records or to obtain history from someone other than the patient: yes Obtain history from someone other than  the patient: yes Review and summarize past medical records: yes Discuss the patient with other providers: yes Independent visualization of images, tracings, or specimens: yes  Risk of Complications, Morbidity, and/or Mortality Presenting problems: high Diagnostic procedures: high Management options: high  Critical Care Total time providing critical care: 30-74 minutes  Patient Progress Patient progress: stable  I spoke with Hospitalist who will admit Final Clinical Impression(s) / ED Diagnoses Final diagnoses:  Altered mental status, unspecified altered mental status type  Sepsis, due to unspecified organism, unspecified whether acute organ dysfunction present [facility])  Urinary tract infection without hematuria, site unspecified    Rx / DC Orders ED Discharge Orders    None       Sidney Ace 03/15/20 1334    Lennice Sites, DO 03/15/20 1539

## 2020-03-15 NOTE — ED Provider Notes (Signed)
Medical screening examination/treatment/procedure(s) were conducted as a shared visit with non-physician practitioner(s) and myself.  I personally evaluated the patient during the encounter. Briefly, the patient is a 86 y.o. female with history of dementia who presents here with altered mental status.  Patient with fever, tachycardia upon arrival.  Code sepsis initiated.  Given broad-spectrum antibiotics.  Patient usually somewhat verbal at baseline.  She appears to be following commands.  She is alert.  She appears dehydrated.  Maybe she has had a cough.  Cannot provide much history.  Lab work was significant for a lactic acid of 3.7.  However blood pressure stable.  Patient was given appropriate IV fluid resuscitation.  Urinalysis consistent with infection.  White count was 18.  Overall patient with sepsis from UTI.  Will admit for further sepsis care.  EKG shows sinus rhythm.  No ischemic changes.   Virgina Norfolk, DO 03/15/20 1210

## 2020-03-15 NOTE — ED Notes (Signed)
Attempted report 

## 2020-03-15 NOTE — Progress Notes (Signed)
HR sustaining 140s. Metoprolol given

## 2020-03-15 NOTE — Plan of Care (Signed)
POC initiated and progressing. 

## 2020-03-15 NOTE — Progress Notes (Addendum)
Patient to room 4E11 from ED. Vital signs obtained. CHG bath completed. Two stage 2's on buttocks on admission. On monitor CCMD notified. A&Ox1. Call bell within reach. Will continue to monitor.  Ginette Otto, RN

## 2020-03-15 NOTE — H&P (Signed)
History and Physical    Shirley [name] BJY:[id number] DOB: 04-01-1934 DOA: 03/15/2020  PCP: Roderick Pee, MD (Inactive)   Patient coming from: [facility]  I have personally briefly reviewed patient's old medical records in [facility]  Chief Complaint: AMS  HPI: Shirley [name] is a 86 y.o. female with medical history significant of hypertension, polymyalgia rheumatica, dementia, SVT, recent COVID 19 PNA and Covid provoked PE in Jan 2021 presented with AMS.  Patient has a baseline dementia, however this morning appeared to be more confused, and a new fever of 101.8.  According to nursing home staff, there was no cough.  Patient does have chronic sinusitis with rhinorrhea.  ED Course: Fever 101.7, tachycardia upon arrival.  Code sepsis initiated.  Given broad-spectrum antibiotics.  She is alert.  She appears dehydrated.  Cannot provide much history.  Lab work was significant for a lactic acid of 3.7.  However blood pressure stable.  Patient was given appropriate IV fluid resuscitation.  Urinalysis consistent UTI.  White count was 18.    Review of Systems: Unable to perform patient confused  Past Medical History:  Diagnosis Date  . Arthritis    "right shoulder; that's why I had to have it replaced"  . Hypertension   . Osteoarthritis   . Osteoporosis   . PMR (polymyalgia rheumatica) (HCC)     Past Surgical History:  Procedure Laterality Date  . CATARACT EXTRACTION W/ INTRAOCULAR LENS  IMPLANT, BILATERAL     2008   . HIP PINNING,CANNULATED Right 10/20/2013   Procedure: CANNULATED HIP PINNING;  Surgeon: Eldred Manges, MD;  Location: [facility];  Service: Orthopedics;  Laterality: Right;  . KYPHOPLASTY N/A 02/28/2013   Procedure: KYPHOPLASTY;  Surgeon: Cristi Loron, MD;  Location: [facility];  Service: Neurosurgery;  Laterality: N/A;  Lumbar two Kyphoplasty  . POSTERIOR FUSION LUMBAR SPINE  03/28/12  . SHOULDER ARTHROSCOPY W/ ROTATOR CUFF REPAIR  ~ 01/2011   right  .  TOTAL SHOULDER REPLACEMENT  06/2011   right  . VAGINAL HYSTERECTOMY  1970's     reports that she quit smoking about 6 years ago. Her smoking use included cigarettes. She has a 20.00 pack-year smoking history. She has never used smokeless tobacco. She reports that she does not drink alcohol or use drugs.  No Known Allergies  Family History  Problem Relation Age of Onset  . ALS Father   . Hypertension Sister      Prior to Admission medications   Medication Sig Start Date End Date Taking? Authorizing Provider  ascorbic acid (VITAMIN C) 500 MG tablet Take 500 mg by mouth 2 (two) times daily.   Yes [provider]  Cholecalciferol (VITAMIN D-3) 25 MCG (1000 UT) CAPS Take 1,000 Units by mouth daily.   Yes [provider]  donepezil (ARICEPT) 10 MG tablet TAKE 1 TABLET EVERY DAY Patient taking differently: Take 10 mg by mouth at bedtime. TAKE 1 TABLET EVERY DAY 11/29/17  Yes Van Clines, MD  metoprolol tartrate (LOPRESSOR) 50 MG tablet Take 1 tablet (50 mg total) by mouth 2 (two) times daily. 01/03/20  Yes Alwyn Ren, MD  Multiple Vitamins-Minerals (MULTIVITAMIN WITH MINERALS) tablet Take 1 tablet by mouth daily.   Yes [provider]  neomycin-bacitracin-polymyxin (NEOSPORIN) [phone] ointment Apply 1 application topically in the morning and at bedtime. Apply to lower lip until healed   Yes [provider]  albuterol (VENTOLIN HFA) 108 (90 Base) MCG/ACT inhaler Inhale  2 puffs into the lungs every 4 (four) hours as needed for wheezing or shortness of breath. 01/03/20   Alwyn Ren, MD  apixaban (ELIQUIS) 5 MG TABS tablet Take 2 tablets (10 mg total) by mouth 2 (two) times daily. Take 10 mg twice a day for 6 days Patient not taking: Reported on 03/15/2020 01/03/20   Alwyn Ren, MD  apixaban (ELIQUIS) 5 MG TABS tablet Take 1 tablet (5 mg total) by mouth 2 (two) times daily. Patient not taking: Reported on 03/15/2020 01/09/20    Alwyn Ren, MD  citalopram (CELEXA) 10 MG tablet TAKE 2 TABLETS EVERY DAY Patient taking differently: Take 10 mg by mouth daily.  04/17/18   Roderick Pee, MD  Skin Protectants, Misc. (MINERIN CREME) CREA Apply 1 application topically every 12 (twelve) hours as needed (to affected area(s)- for rashes).     [provider]  triamcinolone cream (KENALOG) 0.1 % Compound 1;1 with Eucerin cream and apply bid prn rash. 04/25/18   Burchette, Elberta Fortis, MD  vitamin B-12 (CYANOCOBALAMIN) 1000 MCG tablet Take 2 tablets (2,000 mcg total) by mouth daily. 11/28/19   Gwenyth Bender, NP    Physical Exam: Vitals:   03/15/20 1145 03/15/20 1156 03/15/20 1200 03/15/20 1230  BP: (!) 138/91 (!) 136/91 134/86 124/81  Pulse: (!) 118 (!) 118 (!) 120 (!) 116  Resp: (!) 23 (!) 22 18 (!) 21  Temp:    (!) 100.6 F (38.1 C)  TempSrc:    Rectal  SpO2: 95% 98% 100% 96%  Weight:      Height:        Constitutional: NAD, calm, comfortable Vitals:   03/15/20 1145 03/15/20 1156 03/15/20 1200 03/15/20 1230  BP: (!) 138/91 (!) 136/91 134/86 124/81  Pulse: (!) 118 (!) 118 (!) 120 (!) 116  Resp: (!) 23 (!) 22 18 (!) 21  Temp:    (!) 100.6 F (38.1 C)  TempSrc:    Rectal  SpO2: 95% 98% 100% 96%  Weight:      Height:       Eyes: PERRL, lids and conjunctivae normal ENMT: Mucous membranes are dry. Posterior pharynx clear of any exudate or lesions.Normal dentition.  Neck: normal, supple, no masses, no thyromegaly Respiratory: clear to auscultation bilaterally, no wheezing, no crackles. Normal respiratory effort. No accessory muscle use.  Cardiovascular: Tachycardia regular rate and rhythm, no murmurs / rubs / gallops. No extremity edema. 2+ pedal pulses. No carotid bruits.  Abdomen: no tenderness, no masses palpated. No hepatosplenomegaly. Bowel sounds positive.  Musculoskeletal: no clubbing / cyanosis. No joint deformity upper and lower extremities. Good ROM, no contractures. Normal muscle tone.    Skin: no rashes, lesions, ulcers. No induration Neurologic: Following simple commands, moving all her limbs.  No facial droop Psychiatric: Awake alert confused    Labs on Admission: I have personally reviewed following labs and imaging studies  CBC: Recent Labs  Lab 03/15/20 1038  WBC 18.5*  NEUTROABS 15.7*  HGB 14.6  HCT 46.7*  MCV 95.7  PLT 319   Basic Metabolic Panel: Recent Labs  Lab 03/15/20 1038  NA 135  K 4.5  CL 98  CO2 22  GLUCOSE 120*  BUN 12  CREATININE 0.84  CALCIUM 8.9   GFR: Estimated Creatinine Clearance: 41.2 mL/min (by C-G formula based on SCr of 0.84 mg/dL). Liver Function Tests: Recent Labs  Lab 03/15/20 1038  AST 27  ALT 20  ALKPHOS 73  BILITOT 1.0  PROT 7.7  ALBUMIN 3.3*   No results for input(s): LIPASE, AMYLASE in the last 168 hours. No results for input(s): AMMONIA in the last 168 hours. Coagulation Profile: Recent Labs  Lab 03/15/20 1038  INR 1.3*   Cardiac Enzymes: No results for input(s): CKTOTAL, CKMB, CKMBINDEX, TROPONINI in the last 168 hours. BNP (last 3 results) No results for input(s): PROBNP in the last 8760 hours. HbA1C: No results for input(s): HGBA1C in the last 72 hours. CBG: Recent Labs  Lab 03/15/20 1018  GLUCAP 117*   Lipid Profile: No results for input(s): CHOL, HDL, LDLCALC, TRIG, CHOLHDL, LDLDIRECT in the last 72 hours. Thyroid Function Tests: No results for input(s): TSH, T4TOTAL, FREET4, T3FREE, THYROIDAB in the last 72 hours. Anemia Panel: No results for input(s): VITAMINB12, FOLATE, FERRITIN, TIBC, IRON, RETICCTPCT in the last 72 hours. Urine analysis:    Component Value Date/Time   COLORURINE AMBER (A) 03/15/2020 1038   APPEARANCEUR CLOUDY (A) 03/15/2020 1038   LABSPEC 1.014 03/15/2020 1038   PHURINE 5.0 03/15/2020 1038   GLUCOSEU NEGATIVE 03/15/2020 1038   HGBUR MODERATE (A) 03/15/2020 1038   HGBUR trace-intact 05/24/2010 0956   BILIRUBINUR NEGATIVE 03/15/2020 1038   BILIRUBINUR n  08/23/2016 1246   KETONESUR NEGATIVE 03/15/2020 1038   PROTEINUR 100 (A) 03/15/2020 1038   UROBILINOGEN 0.2 08/23/2016 1246   UROBILINOGEN 1.0 07/04/2015 1925   NITRITE POSITIVE (A) 03/15/2020 1038   LEUKOCYTESUR MODERATE (A) 03/15/2020 1038    Radiological Exams on Admission: DG Chest Port 1 View  Result Date: 03/15/2020 CLINICAL DATA:  Acute mental status change.  Fever. EXAM: PORTABLE CHEST 1 VIEW COMPARISON:  December 30, 2019 FINDINGS: There is persistent opacity in the right base which has improved. Opacity in the lateral left mid lung is similar to mildly improved. The heart, hila, mediastinum, lungs, and pleura are otherwise unchanged. IMPRESSION: Improving opacity in the right base in the left lateral mid lung. However, infiltrate does remain in these locations. No other acute abnormalities. Electronically Signed   By: Dorise Bullion III M.D   On: 03/15/2020 12:40    EKG: Independently reviewed.   Assessment/Plan Active Problems:   Acute lower UTI   Sepsis ([facility])  Sepsis, source likely UTI Last concern about pneumonia, given there is no cough or increasing oxygen demand or hypoxia, x-ray showing very much the same infiltrates from January 3 COVID-19 treatment, probably represent a sequelae/focal fibrosis changes. De-escalate antibiotics to ceftriaxone Urine culture Maintain her on IV fluid for today.  PE Provoked from COVID-19 pneumonia, continue Eliquis, aiming at a total of 62-month (end of April)  Chronic sinusitis Start Flonase  Acute metabolic encephalopathy Likely from sepsis, reevaluate tomorrow. Check CT head once for she is on Eliquis  Paroxysmal A. Fib Rate controlled, change metoprolol to as needed for heart rate more than 100, hold for SBP less than 100  on Eliquis for PE, re-evaluate for long term anticoagulation     DVT prophylaxis: Eliquis Code Status: Full code Family Communication: None at bedside Disposition Plan: Go back to SNF once sepsis  resolved Consults called: None Admission status: Telemetry admission   Lequita Halt MD [facility] Pager [phone]    03/15/2020, 2:15 PM

## 2020-03-16 DIAGNOSIS — A419 Sepsis, unspecified organism: Secondary | ICD-10-CM

## 2020-03-16 DIAGNOSIS — I2699 Other pulmonary embolism without acute cor pulmonale: Secondary | ICD-10-CM

## 2020-03-16 DIAGNOSIS — N390 Urinary tract infection, site not specified: Secondary | ICD-10-CM | POA: Diagnosis not present

## 2020-03-16 DIAGNOSIS — L8990 Pressure ulcer of unspecified site, unspecified stage: Secondary | ICD-10-CM | POA: Insufficient documentation

## 2020-03-16 DIAGNOSIS — R4182 Altered mental status, unspecified: Secondary | ICD-10-CM

## 2020-03-16 LAB — MAGNESIUM: Magnesium: 1.7 mg/dL (ref 1.7–2.4)

## 2020-03-16 LAB — BASIC METABOLIC PANEL
Anion gap: 9 (ref 5–15)
BUN: 13 mg/dL (ref 8–23)
CO2: 26 mmol/L (ref 22–32)
Calcium: 8.4 mg/dL — ABNORMAL LOW (ref 8.9–10.3)
Chloride: 104 mmol/L (ref 98–111)
Creatinine, Ser: 0.8 mg/dL (ref 0.44–1.00)
GFR calc Af Amer: >60 mL/min (ref >60)
GFR calc non Af Amer: >60 mL/min (ref >60)
Glucose, Bld: 92 mg/dL (ref 70–99)
Potassium: 3.8 mmol/L (ref 3.5–5.1)
Sodium: 139 mmol/L (ref 135–145)

## 2020-03-16 LAB — CBC
HCT: 37.1 % (ref 36.0–46.0)
Hemoglobin: 11.6 g/dL — ABNORMAL LOW (ref 12.0–15.0)
MCH: 29.5 pg (ref 26.0–34.0)
MCHC: 31.3 g/dL (ref 30.0–36.0)
MCV: 94.4 fL (ref 80.0–100.0)
Platelets: 266 10*3/uL (ref 150–400)
RBC: 3.93 MIL/uL (ref 3.87–5.11)
RDW: 14.4 % (ref 11.5–15.5)
WBC: 15.8 10*3/uL — ABNORMAL HIGH (ref 4.0–10.5)
nRBC: 0 % (ref 0.0–0.2)

## 2020-03-16 LAB — BRAIN NATRIURETIC PEPTIDE: B Natriuretic Peptide: 455.5 pg/mL — ABNORMAL HIGH (ref 0.0–100.0)

## 2020-03-16 MED ORDER — SODIUM CHLORIDE 0.9 % IV SOLN: INTRAVENOUS | Status: DC

## 2020-03-16 MED ORDER — APIXABAN 5 MG PO TABS
5.0000 mg | ORAL_TABLET | Freq: Two times a day (BID) | ORAL | Status: DC
  Administered 2020-03-16 – 2020-03-18 (×4): 5 mg via ORAL
  Filled 2020-03-16 (×5): qty 1

## 2020-03-16 MED ORDER — CITALOPRAM HYDROBROMIDE 20 MG PO TABS
10.0000 mg | ORAL_TABLET | Freq: Every day | ORAL | Status: DC
  Administered 2020-03-16 – 2020-03-18 (×2): 10 mg via ORAL
  Filled 2020-03-16 (×3): qty 1

## 2020-03-16 NOTE — Evaluation (Signed)
Physical Therapy Evaluation Patient Details Name: Shirley [name] MRN: [id number] DOB: 1934/09/11 Today's Date: 03/16/2020   History of Present Illness  Pt is a 86 y.o. female with medical history significant of  hypertension, polymyalgia rheumatica, dementia, SVT, recent COVID 19 PNA and Covid provoked PE in Jan 2021 presented with AMS on top of her underlying mild dementia.  She was diagnosed with sepsis due to UTI and admitted to the hospital.  Clinical Impression  Pt admitted with above diagnosis. Evaluation was limited due to pt's confusion.  She was able to follow simple functional commands with increased cues and time.  Per prior admission, pt was ambulatory with RW, but no visitors present to confirm.  Pt was min-mod A today for transfers but could only ambulate 1' at [facility] with bed behind legs for support.   Pt currently with functional limitations due to the deficits listed below (see PT Problem List). Pt will benefit from skilled PT to increase their independence and safety with mobility to allow discharge to the venue listed below.       Follow Up Recommendations (Return to [facility])    Equipment Recommendations  None recommended by PT    Recommendations for Other Services       Precautions / Restrictions Precautions Precautions: Fall      Mobility  Bed Mobility Overal bed mobility: Needs Assistance Bed Mobility: Supine to Sit;Sit to Supine     Supine to sit: Min assist Sit to supine: Mod assist   General bed mobility comments: increased time and cues; multimodal cues as well as assist to initiate  Transfers Overall transfer level: Needs assistance Equipment used: Rolling walker (2 wheeled) Transfers: Sit to/from Stand Sit to Stand: Mod assist         General transfer comment: Performed x 3 with facilitation , assist for hand placment and bed elevated; Pt had BM and required assist with cleaning  Ambulation/Gait Ambulation/Gait assistance:  Mod assist Gait Distance (Feet): 1 Feet Assistive device: Rolling walker (2 wheeled) Gait Pattern/deviations: Shuffle;Wide base of support;Trunk flexed Gait velocity: decreased   General Gait Details: keeping knees extended and legs against bed for support, only able to take a few steps at EOB; mod A for balance and use of RW  Stairs            Wheelchair Mobility    Modified Rankin (Stroke Patients Only)       Balance Overall balance assessment: Needs assistance Sitting-balance support: Feet supported;No upper extremity supported Sitting balance-Leahy Scale: Fair     Standing balance support: Bilateral upper extremity supported;During functional activity Standing balance-Leahy Scale: Poor                               Pertinent Vitals/Pain Pain Assessment: No/denies pain    Home Living Family/patient expects to be discharged to:: Skilled nursing facility                 Additional Comments: from a memory [facility]; pt unable to provide details on prior levels, family not available    Prior Function Level of Independence: Needs assistance   Gait / Transfers Assistance Needed: as per chart prior to last admission pt was ambulating with RW  ADL's / Homemaking Assistance Needed: anticipate needing assist for ADLs, pt unable to report PLOF  Comments: all information taken from prior charting     Hand Dominance  Extremity/Trunk Assessment   Upper Extremity Assessment Upper Extremity Assessment: Generalized weakness;Difficult to assess due to impaired cognition    Lower Extremity Assessment Lower Extremity Assessment: Generalized weakness;Difficult to assess due to impaired cognition    Cervical / Trunk Assessment Cervical / Trunk Assessment: Normal  Communication      Cognition Arousal/Alertness: Awake/alert Behavior During Therapy: WFL for tasks assessed/performed Overall Cognitive Status: No family/caregiver  present to determine baseline cognitive functioning Area of Impairment: Orientation;Following commands                 Orientation Level: Person     Following Commands: Follows one step commands inconsistently       General Comments: Pt very confused and not able to answer PLOF or orientation questions.  Additionally, not able to respond yes/no appropriatly and consistently.      General Comments General comments (skin integrity, edema, etc.): VSS; replaced hand mittens post tx    Exercises     Assessment/Plan    PT Assessment Patient needs continued PT services  PT Problem List Decreased strength;Decreased mobility;Decreased safety awareness;Decreased coordination;Decreased activity tolerance;Decreased cognition;Decreased balance;Decreased knowledge of use of DME       PT Treatment Interventions DME instruction;Therapeutic activities;Gait training;Therapeutic exercise;Patient/family education;Balance training;Functional mobility training;Cognitive remediation    PT Goals (Current goals can be found in the Care Plan section)  Acute Rehab PT Goals Patient Stated Goal: unable PT Goal Formulation: Patient unable to participate in goal setting Time For Goal Achievement: 03/30/20 Potential to Achieve Goals: Fair    Frequency Min 2X/week   Barriers to discharge        Co-evaluation               AM-PAC PT "6 Clicks" Mobility  Outcome Measure Help needed turning from your back to your side while in a flat bed without using bedrails?: A Little Help needed moving from lying on your back to sitting on the side of a flat bed without using bedrails?: A Little Help needed moving to and from a bed to a chair (including a wheelchair)?: A Lot Help needed standing up from a chair using your arms (e.g., wheelchair or bedside chair)?: A Lot Help needed to walk in hospital room?: A Lot Help needed climbing 3-5 steps with a railing? : Total 6 Click Score: 13    End of  Session   Activity Tolerance: Other (comment)(limited due to confusion) Patient left: in bed;with call bell/phone within reach;with bed alarm set Nurse Communication: Mobility status PT Visit Diagnosis: Other abnormalities of gait and mobility (R26.89);Muscle weakness (generalized) (M62.81)    Time: 5809-9833 PT Time Calculation (min) (ACUTE ONLY): 18 min   Charges:   PT Evaluation $PT Eval Low Complexity: 1 Low          Maggie Font, PT Acute Rehab Services Pager [phone] [facility] [phone] [facility] [phone]   Karlton Lemon 03/16/2020, 4:40 PM

## 2020-03-16 NOTE — Progress Notes (Signed)
PROGRESS NOTE                                                                                                                                                                                                             Patient Demographics:    Shirley [name], is a 86 y.o. female, DOB - 18-Jul-1934, RPR:[id number]  Admit date - 03/15/2020   Admitting Physician Emeline General, MD  Outpatient Primary MD for the patient is Roderick Pee, MD (Inactive)  LOS - 1  Chief Complaint  Patient presents with  . Altered Mental Status       Brief Narrative  Shirley [name] is a 86 y.o. female with medical history significant of hypertension, polymyalgia rheumatica, dementia, SVT, recent COVID 19 PNA and Covid provoked PE in Jan 2021 presented with AMS on top of her underlying mild dementia.  She was diagnosed with sepsis due to UTI and admitted to the hospital.   Subjective:    Shirley [name] today has, No headache, No chest pain, No abdominal pain - No Nausea, No new weakness tingling or numbness, No Cough - SOB.     Assessment  & Plan :     1.  Sepsis due to UTI along with some dehydration.  Responding well to IV fluids and Rocephin, sepsis pathophysiology has resolved, continue to trend cultures, continue IV antibiotics.  Advance activity.  PT OT.  2.  Recently treated COVID-19 infection.  No acute issue.  3.  Recent PE.  On Eliquis.  Not currently high-dose Eliquis due to recent PE.  Dose has been adjusted.  4.  Underlying dementia.  At risk for delirium, supportive care, currently mentation is close to baseline, minimize narcotics and benzodiazepines.  Continue Aricept.  5.  History of PMR.  Supportive care and monitor.   Family Communication  : [phone] -called daughter on 03/16/2020 at 11:06 AM message left.  Code Status :  DNR  Disposition Plan  : In the hospital for treatment of sepsis caused by UTI  Consults  : None  Procedures  : None  DVT Prophylaxis  :   Eliquis  Lab Results  Component Value Date   PLT 266 03/16/2020    Diet :  Diet Order            Diet Heart Room service appropriate? Yes with Assist; Fluid consistency: Thin  Diet effective now  Inpatient Medications Scheduled Meds: . apixaban  5 mg Oral BID  . ascorbic acid  500 mg Oral BID  . cholecalciferol  1,000 Units Oral Daily  . citalopram  10 mg Oral Daily  . donepezil  10 mg Oral QHS  . fluticasone  2 spray Each Nare Daily  . lactobacillus acidophilus  2 tablet Oral TID  . vitamin B-12  2,000 mcg Oral Daily  . zinc sulfate  220 mg Oral Daily   Continuous Infusions: . sodium chloride 75 mL/hr at 03/16/20 0748  . cefTRIAXone (ROCEPHIN)  IV Stopped (03/16/20 0245)   PRN Meds:.acetaminophen **OR** acetaminophen, albuterol, metoprolol tartrate, triamcinolone cream  Antibiotics  :   Anti-infectives (From admission, onward)   Start     Dose/Rate Route Frequency Ordered Stop   03/16/20 1100  vancomycin (VANCOREADY) IVPB 750 mg/150 mL  Status:  Discontinued     750 mg 150 mL/hr over 60 Minutes Intravenous Every 24 hours 03/15/20 1151 03/15/20 1352   03/15/20 2300  ceFEPIme (MAXIPIME) 2 g in sodium chloride 0.9 % 100 mL IVPB  Status:  Discontinued     2 g 200 mL/hr over 30 Minutes Intravenous Every 12 hours 03/15/20 1151 03/15/20 1352   03/15/20 1400  cefTRIAXone (ROCEPHIN) 1 g in sodium chloride 0.9 % 100 mL IVPB     1 g 200 mL/hr over 30 Minutes Intravenous Every 24 hours 03/15/20 1352     03/15/20 1030  vancomycin (VANCOCIN) IVPB 1000 mg/200 mL premix     1,000 mg 200 mL/hr over 60 Minutes Intravenous  Once 03/15/20 1016 03/15/20 1222   03/15/20 1030  ceFEPIme (MAXIPIME) 2 g in sodium chloride 0.9 % 100 mL IVPB     2 g 200 mL/hr over 30 Minutes Intravenous  Once 03/15/20 1016 03/15/20 1222   03/15/20 1030  azithromycin (ZITHROMAX) 500 mg in sodium chloride 0.9 % 250 mL IVPB     500 mg 250 mL/hr over 60 Minutes Intravenous  Once 03/15/20 1016  03/15/20 1223          Objective:   Vitals:   03/15/20 2001 03/15/20 2103 03/16/20 0409 03/16/20 0734  BP: 122/69  123/78 140/90  Pulse: 85  63 68  Resp: 14  17 18   Temp: 98.7 F (37.1 C) 97.6 F (36.4 C) 97.6 F (36.4 C) 97.7 F (36.5 C)  TempSrc: Oral Oral Oral Oral  SpO2: 96%  96% 99%  Weight:      Height:        SpO2: 99 %  Wt Readings from Last 3 Encounters:  03/15/20 56.2 kg  03/12/20 56.2 kg  01/03/20 56.6 kg     Intake/Output Summary (Last 24 hours) at 03/16/2020 1106 Last data filed at 03/16/2020 0248 Gross per 24 hour  Intake 3278.75 ml  Output 200 ml  Net 3078.75 ml     Physical Exam  Awake Alert, No new F.N deficits, she is oriented to place and person but not time, Shirley [name],PERRAL Supple Neck,No JVD, No cervical lymphadenopathy appriciated.  Symmetrical Chest wall movement, Good air movement bilaterally, CTAB RRR,No Gallops,Rubs or new Murmurs, No Parasternal Heave +ve B.Sounds, Abd Soft, No tenderness, No organomegaly appriciated, No rebound - guarding or rigidity. No Cyanosis, Clubbing or edema, No new Rash or bruise       Data Review:    Recent Labs  Lab 03/15/20 1038 03/16/20 0337  WBC 18.5* 15.8*  HGB 14.6 11.6*  HCT 46.7* 37.1  PLT 319 266  MCV 95.7 94.4  MCH 29.9 29.5  MCHC 31.3 31.3  RDW 14.6 14.4  LYMPHSABS 0.9  --   MONOABS 1.7*  --   EOSABS 0.0  --   BASOSABS 0.1  --     Recent Labs  Lab 03/15/20 1038 03/16/20 0337  NA 135 139  K 4.5 3.8  CL 98 104  CO2 22 26  GLUCOSE 120* 92  BUN 12 13  CREATININE 0.84 0.80  CALCIUM 8.9 8.4*  AST 27  --   ALT 20  --   ALKPHOS 73  --   BILITOT 1.0  --   ALBUMIN 3.3*  --   MG  --  1.7  INR 1.3*  --   BNP  --  455.5*    Recent Labs  Lab 03/15/20 1546 03/16/20 0337  BNP  --  455.5*  SARSCOV2NAA NEGATIVE  --     ------------------------------------------------------------------------------------------------------------------ No results for input(s): CHOL, HDL,  LDLCALC, TRIG, CHOLHDL, LDLDIRECT in the last 72 hours.  Lab Results  Component Value Date   HGBA1C 6.7 (H) 12/08/2019   ------------------------------------------------------------------------------------------------------------------ No results for input(s): TSH, T4TOTAL, T3FREE, THYROIDAB in the last 72 hours.  Invalid input(s): FREET3 ------------------------------------------------------------------------------------------------------------------ No results for input(s): VITAMINB12, FOLATE, FERRITIN, TIBC, IRON, RETICCTPCT in the last 72 hours.  Coagulation profile Recent Labs  Lab 03/15/20 1038  INR 1.3*    No results for input(s): DDIMER in the last 72 hours.  Cardiac Enzymes No results for input(s): CKMB, TROPONINI, MYOGLOBIN in the last 168 hours.  Invalid input(s): CK ------------------------------------------------------------------------------------------------------------------    Component Value Date/Time   BNP 455.5 (H) 03/16/2020 4174    Micro Results Recent Results (from the past 240 hour(s))  Blood Culture (routine x 2)     Status: None (Preliminary result)   Collection Time: 03/15/20 10:38 AM   Specimen: BLOOD RIGHT HAND  Result Value Ref Range Status   Specimen Description BLOOD RIGHT HAND  Final   Special Requests   Final    BOTTLES DRAWN AEROBIC AND ANAEROBIC Blood Culture adequate volume   Culture   Final    NO GROWTH < 24 HOURS Performed at [facility], [address], [place], [place] [address]    Report Status PENDING  Incomplete  Urine culture     Status: Abnormal (Preliminary result)   Collection Time: 03/15/20 10:38 AM   Specimen: In/Out Cath Urine  Result Value Ref Range Status   Specimen Description IN/OUT CATH URINE  Final   Special Requests NONE  Final   Culture (A)  Final    >=100,000 COLONIES/mL GRAM NEGATIVE RODS CULTURE REINCUBATED FOR BETTER GROWTH SUSCEPTIBILITIES TO FOLLOW Performed at [facility], [address] [place], [place] [address]    Report Status PENDING  Incomplete  Blood Culture (routine x 2)     Status: None (Preliminary result)   Collection Time: 03/15/20 10:45 AM   Specimen: BLOOD  Result Value Ref Range Status   Specimen Description BLOOD SITE NOT SPECIFIED  Final   Special Requests   Final    BOTTLES DRAWN AEROBIC AND ANAEROBIC Blood Culture results may not be optimal due to an inadequate volume of blood received in culture bottles   Culture   Final    NO GROWTH < 24 HOURS Performed at [facility], [address] [place], [place] [address]    Report Status PENDING  Incomplete  MRSA PCR Screening     Status: None   Collection Time: 03/15/20  3:09  PM   Specimen: Nasopharyngeal Wash  Result Value Ref Range Status   MRSA by PCR NEGATIVE NEGATIVE Final    Comment:        The GeneXpert MRSA Assay (FDA approved for NASAL specimens only), is one component of a comprehensive MRSA colonization surveillance program. It is not intended to diagnose MRSA infection nor to guide or monitor treatment for MRSA infections. Performed at [facility], [place] [address], [place], [place] [address]   SARS CORONAVIRUS 2 (TAT 6-24 HRS) Nasopharyngeal Nasopharyngeal Swab     Status: None   Collection Time: 03/15/20  3:46 PM   Specimen: Nasopharyngeal Swab  Result Value Ref Range Status   SARS Coronavirus 2 NEGATIVE NEGATIVE Final    Comment: (NOTE) SARS-CoV-2 target nucleic acids are NOT DETECTED. The SARS-CoV-2 RNA is generally detectable in upper and lower respiratory specimens during the acute phase of infection. Negative results do not preclude SARS-CoV-2 infection, do not rule out co-infections with other pathogens, and should not be used as the sole basis for treatment or other patient management decisions. Negative results must be combined with clinical observations, patient history, and epidemiological information. The expected result is Negative. Fact Sheet for  Patients: SugarRoll.be Fact Sheet for Healthcare Providers: https://www.woods-mathews.com/ This test is not yet approved or cleared by the [place] FDA and  has been authorized for detection and/or diagnosis of SARS-CoV-2 by FDA under an Emergency Use Authorization (EUA). This EUA will remain  in effect (meaning this test can be used) for the duration of the COVID-19 declaration under Section 56 4(b)(1) of the Act, 21 U.S.C. section 360bbb-3(b)(1), unless the authorization is terminated or revoked sooner. Performed at [facility], [place] [address] [place], [place] [address]     Radiology Reports DG Chest Struble 1 View  Result Date: 03/15/2020 CLINICAL DATA:  Acute mental status change.  Fever. EXAM: PORTABLE CHEST 1 VIEW COMPARISON:  December 30, 2019 FINDINGS: There is persistent opacity in the right base which has improved. Opacity in the lateral left mid lung is similar to mildly improved. The heart, hila, mediastinum, lungs, and pleura are otherwise unchanged. IMPRESSION: Improving opacity in the right base in the left lateral mid lung. However, infiltrate does remain in these locations. No other acute abnormalities. Electronically Signed   By: Dorise Bullion III M.D   On: 03/15/2020 12:40    Time Spent in minutes  30   Lala Lund M.D on 03/16/2020 at 11:06 AM  To page go to [email or web address] - password [facility]

## 2020-03-17 DIAGNOSIS — I2699 Other pulmonary embolism without acute cor pulmonale: Secondary | ICD-10-CM | POA: Diagnosis not present

## 2020-03-17 DIAGNOSIS — N390 Urinary tract infection, site not specified: Secondary | ICD-10-CM | POA: Diagnosis not present

## 2020-03-17 DIAGNOSIS — A419 Sepsis, unspecified organism: Secondary | ICD-10-CM | POA: Diagnosis not present

## 2020-03-17 DIAGNOSIS — R4182 Altered mental status, unspecified: Secondary | ICD-10-CM | POA: Diagnosis not present

## 2020-03-17 LAB — CBC WITH DIFFERENTIAL/PLATELET
Abs Immature Granulocytes: 0.05 10*3/uL (ref 0.00–0.07)
Basophils Absolute: 0.1 10*3/uL (ref 0.0–0.1)
Basophils Relative: 1 %
Eosinophils Absolute: 0.6 10*3/uL — ABNORMAL HIGH (ref 0.0–0.5)
Eosinophils Relative: 5 %
HCT: 35.3 % — ABNORMAL LOW (ref 36.0–46.0)
Hemoglobin: 11 g/dL — ABNORMAL LOW (ref 12.0–15.0)
Immature Granulocytes: 1 %
Lymphocytes Relative: 12 %
Lymphs Abs: 1.3 10*3/uL (ref 0.7–4.0)
MCH: 29.4 pg (ref 26.0–34.0)
MCHC: 31.2 g/dL (ref 30.0–36.0)
MCV: 94.4 fL (ref 80.0–100.0)
Monocytes Absolute: 1.2 10*3/uL — ABNORMAL HIGH (ref 0.1–1.0)
Monocytes Relative: 11 %
Neutro Abs: 7.2 10*3/uL (ref 1.7–7.7)
Neutrophils Relative %: 70 %
Platelets: 248 10*3/uL (ref 150–400)
RBC: 3.74 MIL/uL — ABNORMAL LOW (ref 3.87–5.11)
RDW: 14.4 % (ref 11.5–15.5)
WBC: 10.3 10*3/uL (ref 4.0–10.5)
nRBC: 0 % (ref 0.0–0.2)

## 2020-03-17 LAB — COMPREHENSIVE METABOLIC PANEL
ALT: 15 U/L (ref 0–44)
AST: 16 U/L (ref 15–41)
Albumin: 2.4 g/dL — ABNORMAL LOW (ref 3.5–5.0)
Alkaline Phosphatase: 57 U/L (ref 38–126)
Anion gap: 10 (ref 5–15)
BUN: 9 mg/dL (ref 8–23)
CO2: 23 mmol/L (ref 22–32)
Calcium: 8.2 mg/dL — ABNORMAL LOW (ref 8.9–10.3)
Chloride: 103 mmol/L (ref 98–111)
Creatinine, Ser: 0.79 mg/dL (ref 0.44–1.00)
GFR calc Af Amer: >60 mL/min (ref >60)
GFR calc non Af Amer: >60 mL/min (ref >60)
Glucose, Bld: 85 mg/dL (ref 70–99)
Potassium: 3 mmol/L — ABNORMAL LOW (ref 3.5–5.1)
Sodium: 136 mmol/L (ref 135–145)
Total Bilirubin: 0.7 mg/dL (ref 0.3–1.2)
Total Protein: 5.9 g/dL — ABNORMAL LOW (ref 6.5–8.1)

## 2020-03-17 LAB — BRAIN NATRIURETIC PEPTIDE: B Natriuretic Peptide: 383.2 pg/mL — ABNORMAL HIGH (ref 0.0–100.0)

## 2020-03-17 LAB — MAGNESIUM: Magnesium: 1.5 mg/dL — ABNORMAL LOW (ref 1.7–2.4)

## 2020-03-17 MED ORDER — POTASSIUM CHLORIDE CRYS ER 20 MEQ PO TBCR
40.0000 meq | EXTENDED_RELEASE_TABLET | Freq: Two times a day (BID) | ORAL | Status: AC
  Administered 2020-03-17 (×2): 40 meq via ORAL
  Filled 2020-03-17 (×2): qty 2

## 2020-03-17 MED ORDER — MAGNESIUM SULFATE 2 GM/50ML IV SOLN
2.0000 g | Freq: Once | INTRAVENOUS | Status: AC
  Administered 2020-03-17: 2 g via INTRAVENOUS
  Filled 2020-03-17: qty 50

## 2020-03-17 MED ORDER — ONDANSETRON HCL 4 MG/2ML IJ SOLN: 4.0000 mg | Freq: Four times a day (QID) | INTRAMUSCULAR | Status: DC | PRN

## 2020-03-17 NOTE — Evaluation (Signed)
Occupational Therapy Evaluation Patient Details Name: Shirley [name] MRN: [id number] DOB: [birth year] Today's Date: 03/17/2020    History of Present Illness Pt is a 86 y.o. female with medical history significant of  hypertension, polymyalgia rheumatica, dementia, SVT, recent COVID 19 PNA and Covid provoked PE in Jan 2021 presented with AMS on top of her underlying mild dementia.  She was diagnosed with sepsis due to UTI and admitted to the hospital.   Clinical Impression   This 86 y/o female presents with the above. PTA pt residing in memory care unit, suspect requiring assist for ADL completion. Pt with history of dementia and notable cognitive impairments today, very easily distracted during session completion and occasionally with nonsensical speech. Pt requiring up to maxA for bed mobility and to maintain balance seated EOB - pt fatiguing quickly and initiates return to supine. Of note pt also requiring totalA for pericare as pt noted to have had incontinent BM once return to supine, declined attempts for OOB to recliner post completion of pericare. Pt will benefit from continued acute OT services and recommend pt return to previous facility (pending they are able to provide necessary assist) with follow up OT services. Will follow.     Follow Up Recommendations  SNF(return to previous facility)    Equipment Recommendations  Other (comment)(defer to next venue)           Precautions / Restrictions Precautions Precautions: Fall Restrictions Weight Bearing Restrictions: No      Mobility Bed Mobility Overal bed mobility: Needs Assistance Bed Mobility: Rolling;Supine to Sit;Sit to Supine Rolling: Min guard   Supine to sit: Max assist Sit to supine: Min assist   General bed mobility comments: maxA to initiate and carry out mobility task to progress to EOB, pt self-initiating return to supine due to fatigue; pt able to roll towards her L during completion of pericare    Transfers                 General transfer comment: unable to attempt    Balance Overall balance assessment: Needs assistance Sitting-balance support: Feet supported;Single extremity supported;Bilateral upper extremity supported Sitting balance-Leahy Scale: Poor Sitting balance - Comments: pt requiring mod-maxA for balance EOB, fatiguing quickly and often leaning posteriorly or onto L elbow                                   ADL either performed or assessed with clinical judgement   ADL Overall ADL's : Needs assistance/impaired Eating/Feeding: Set up;Supervision/ safety Eating/Feeding Details (indicate cue type and reason): pt had completed eating breakfast upon arrival Grooming: Minimal assistance;Bed level;Wash/dry face   Upper Body Bathing: Moderate assistance;Bed level   Lower Body Bathing: Total assistance;Bed level   Upper Body Dressing : Moderate assistance;Bed level   Lower Body Dressing: Maximal assistance;Bed level Lower Body Dressing Details (indicate cue type and reason): pt was able to perform figure 4 while in bed to adjust her socks when cued to do so     Toileting- Clothing Manipulation and Hygiene: Total assistance;Bed level Toileting - Clothing Manipulation Details (indicate cue type and reason): pt noted to have had incontinent BM, totalA for pericare provided        General ADL Comments: pt tolerated bed mobility and sitting EOB, pt easily fatigued and distractible while sitting EOB (dietary arriving and attempting to take lunch order at this time), once dietary complete pt too fatigued and  attempting to lie back down - noted incontinence and assisted with pericare, post pericare pt declined additional attempts for OOB      Vision         Perception     Praxis      Pertinent Vitals/Pain Pain Assessment: No/denies pain     Hand Dominance Right   Extremity/Trunk Assessment Upper Extremity Assessment Upper Extremity  Assessment: Generalized weakness;Difficult to assess due to impaired cognition   Lower Extremity Assessment Lower Extremity Assessment: Defer to PT evaluation       Communication Communication Communication: No difficulties   Cognition Arousal/Alertness: Awake/alert Behavior During Therapy: WFL for tasks assessed/performed Overall Cognitive Status: History of cognitive impairments - at baseline                                 General Comments: pt with hx of dementia, very easily distracted today, follows some commands but does so inconsistently, often with tangential/nonsensical speech   General Comments  VSS, noted pt with small wound to sacral region, RN notified and aware    Exercises     Shoulder Instructions      Home Living Family/patient expects to be discharged to:: Skilled nursing facility                                 Additional Comments: from a memory care unit at [facility]; pt unable to provide details on prior levels, family not available      Prior Functioning/Environment Level of Independence: Needs assistance  Gait / Transfers Assistance Needed: as per chart prior to last admission pt was ambulating with RW ADL's / Homemaking Assistance Needed: anticipate needing assist for ADLs, pt unable to report PLOF   Comments: all information taken from prior charting        OT Problem List: Decreased strength;Decreased range of motion;Decreased activity tolerance;Impaired balance (sitting and/or standing);Decreased cognition;Decreased safety awareness      OT Treatment/Interventions: Self-care/ADL training;Neuromuscular education;Energy conservation;DME and/or AE instruction;Therapeutic activities;Cognitive remediation/compensation;Patient/family education;Balance training;Therapeutic exercise    OT Goals(Current goals can be found in the care plan section) Acute Rehab OT Goals Patient Stated Goal: unable OT Goal Formulation:  Patient unable to participate in goal setting Time For Goal Achievement: 03/31/20 Potential to Achieve Goals: Good  OT Frequency: Min 2X/week   Barriers to D/C:            Co-evaluation              AM-PAC OT "6 Clicks" Daily Activity     Outcome Measure Help from another person eating meals?: A Little Help from another person taking care of personal grooming?: A Lot Help from another person toileting, which includes using toliet, bedpan, or urinal?: Total Help from another person bathing (including washing, rinsing, drying)?: A Lot Help from another person to put on and taking off regular upper body clothing?: A Lot Help from another person to put on and taking off regular lower body clothing?: Total 6 Click Score: 11   End of Session Nurse Communication: Mobility status  Activity Tolerance: Patient tolerated treatment well;Patient limited by fatigue Patient left: in bed;with call bell/phone within reach;with bed alarm set  OT Visit Diagnosis: Muscle weakness (generalized) (M62.81);Other symptoms and signs involving cognitive function;Other abnormalities of gait and mobility (R26.89)  Time: 7681-1572 OT Time Calculation (min): 26 min Charges:  OT General Charges $OT Visit: 1 Visit OT Evaluation $OT Eval Moderate Complexity: 1 Mod OT Treatments $Self Care/Home Management : 8-22 mins  Lou Cal, OT Acute Rehabilitation Services Pager [phone] Office [phone]   Raymondo Band 03/17/2020, 1:35 PM

## 2020-03-17 NOTE — TOC Initial Note (Signed)
Transition of Care [facility]) - Initial/Assessment Note    Patient Details  Name: Shirley [name] MRN: [id number] Date of Birth: 1934/09/17  [facility]) CM/SW Contact:    Eduard Roux, LCSWA Phone Number: 03/17/2020, 3:10 PM  Clinical Narrative:                  CSW spoke with the patient's daughter,Leanne. She confirmed the patient is a resident at [facility] and is agrees with discharge plan for patient to return to ALF.  CSW contacted [facility] - informed of patient's  anticipated discharge tomorrow. CSW will fax discharge information to [phone]. CSW was informed patient does not need a covid, she has been vaccinated.   CSW will continue to follow and assist with discharge planning.   Antony Blackbird, MSW, LCSWA Clinical Social Worker   Expected Discharge Plan: Assisted Living     Patient Goals and CMS Choice        Expected Discharge Plan and Services Expected Discharge Plan: Assisted Living In-house Referral: Clinical Social Work     Living arrangements for the past 2 months: Assisted Living Facility                                      Prior Living Arrangements/Services Living arrangements for the past 2 months: Assisted Living Facility Lives with:: Facility Resident Patient language and need for interpreter reviewed:: No        Need for Family Participation in Patient Care: Yes (Comment) Care giver support system in place?: Yes (comment)   Criminal Activity/Legal Involvement Pertinent to Current Situation/Hospitalization: No - Comment as needed  Activities of Daily Living      Permission Sought/Granted Permission sought to share information with : Family Supports    Share Information with NAME: Junius Argyle  Permission granted to share info w AGENCY: [facility]  Permission granted to share info w Relationship: daughter  Permission granted to share info w Contact  Information: [phone]  Emotional Assessment   Attitude/Demeanor/Rapport: Unable to Assess Affect (typically observed): Unable to Assess Orientation: : Oriented to Self Alcohol / Substance Use: Not Applicable Psych Involvement: No (comment)  Admission diagnosis:  Sepsis (HCC) [A41.9] Urinary tract infection without hematuria, site unspecified [N39.0] Altered mental status, unspecified altered mental status type [R41.82] Sepsis, due to unspecified organism, unspecified whether acute organ dysfunction present [facility]) [A41.9] Patient Active Problem List   Diagnosis Date Noted  . Pressure injury of skin 03/16/2020  . Acute lower UTI 03/15/2020  . Sepsis (HCC) 03/15/2020  . New onset atrial fibrillation (HCC) 12/31/2019  . Pneumonia 12/31/2019  . Dementia with behavioral disturbance (HCC)   . Pneumonia due to COVID-19 virus 12/03/2019  . Hypernatremia   . HCAP (healthcare-associated pneumonia) 12/02/2019  . Palliative care by specialist   . Goals of care, counseling/discussion   . DNR (do not resuscitate)   . Acute encephalopathy 11/23/2019  . Ascending aortic aneurysm (HCC) 11/23/2019  . Moderate dementia (HCC) 08/10/2015  . Frequent falls 05/08/2015  . Anxiety 05/08/2015  . Anxiety state, unspecified 03/06/2014  . Short-term memory loss 12/30/2013  . Lumbar degenerative disc disease 08/22/2011  . Polymyalgia rheumatica (HCC) 05/16/2008  . Essential hypertension 03/12/2008  . Arthritis, senescent 03/12/2008  . MYALGIA 03/12/2008  . OSTEOPOROSIS 03/12/2008   PCP:  Roderick Pee, MD (Inactive) Pharmacy:  No Pharmacies Listed  Social Determinants of Health (SDOH) Interventions    Readmission Risk Interventions Readmission Risk Prevention Plan 12/10/2019  Transportation Screening Complete  Home Care Screening Not Complete  Home Care Screening Not Completed Comments Returning to SNF  Medication Review (RN CM) Complete  Some recent data might be hidden

## 2020-03-17 NOTE — Plan of Care (Signed)
?  Problem: Coping: ?Goal: Level of anxiety will decrease ?Outcome: Progressing ?  ?Problem: Safety: ?Goal: Ability to remain free from injury will improve ?Outcome: Progressing ?  ?

## 2020-03-17 NOTE — Progress Notes (Signed)
Patient refusing to take any PO meds. Tried applesauce and pudding and patient states "she does not like either of those" but will not swallow pills whole with water either

## 2020-03-17 NOTE — Progress Notes (Signed)
PROGRESS NOTE                                                                                                                                                                                                             Patient Demographics:    Shirley [name], is a 86 y.o. female, DOB - [birth year], ZJQ:[id number]  Admit date - 03/15/2020   Admitting Physician Emeline General, MD  Outpatient Primary MD for the patient is Roderick Pee, MD (Inactive)  LOS - 2  Chief Complaint  Patient presents with  . Altered Mental Status       Brief Narrative  Shirley [name] is a 86 y.o. female with medical history significant of hypertension, polymyalgia rheumatica, dementia, SVT, recent COVID 19 PNA and Covid provoked PE in Jan 2021 presented with AMS on top of her underlying mild dementia.  She was diagnosed with sepsis due to UTI and admitted to the hospital.   Subjective:   Patient in bed appears to be in no distress but slightly more confused today, denies any headache chest or abdominal pain.  Following basic questions and basic commands.   Assessment  & Plan :     1.  Sepsis due to UTI along with some dehydration.  Improved after empiric IV Rocephin and IV fluids, urine culture growing pansensitive take E. coli, will switch to oral Keflex tomorrow for 2 more days and try to discharge her to SNF if otherwise stable..  2.  Recently treated COVID-19 infection.  No acute issue.  3.  Recent PE.  On Eliquis.  Not currently high-dose Eliquis due to recent PE.  Dose has been adjusted.  4.  Underlying dementia.  At risk for delirium, supportive care, currently mentation is close to baseline, minimize narcotics and benzodiazepines.  Continue Aricept.  5.  History of PMR.  Supportive care and monitor.   Family Communication  : [phone] -called daughter on 03/16/2020 at 11:06 AM message left.  Code Status :  DNR  Disposition Plan  : In the hospital for treatment of sepsis caused  by UTI, if clinically stable SNF on 03/18/2020.  Consults  : None  Procedures  : None  DVT Prophylaxis  :  Eliquis  Lab Results  Component Value Date   PLT 248 03/17/2020    Diet :  Diet Order            Diet Heart Room service appropriate? Yes with Assist; Fluid consistency: Thin  Diet effective now               Inpatient Medications Scheduled Meds: . apixaban  5 mg Oral BID  . ascorbic acid  500 mg Oral BID  . cholecalciferol  1,000 Units Oral Daily  . citalopram  10 mg Oral Daily  . donepezil  10 mg Oral QHS  . fluticasone  2 spray Each Nare Daily  . lactobacillus acidophilus  2 tablet Oral TID  . potassium chloride  40 mEq Oral BID  . vitamin B-12  2,000 mcg Oral Daily  . zinc sulfate  220 mg Oral Daily   Continuous Infusions: . cefTRIAXone (ROCEPHIN)  IV 1 g (03/16/20 1305)   PRN Meds:.acetaminophen **OR** [DISCONTINUED] acetaminophen, albuterol, metoprolol tartrate, ondansetron (ZOFRAN) IV, triamcinolone cream  Antibiotics  :   Anti-infectives (From admission, onward)   Start     Dose/Rate Route Frequency Ordered Stop   03/16/20 1100  vancomycin (VANCOREADY) IVPB 750 mg/150 mL  Status:  Discontinued     750 mg 150 mL/hr over 60 Minutes Intravenous Every 24 hours 03/15/20 1151 03/15/20 1352   03/15/20 2300  ceFEPIme (MAXIPIME) 2 g in sodium chloride 0.9 % 100 mL IVPB  Status:  Discontinued     2 g 200 mL/hr over 30 Minutes Intravenous Every 12 hours 03/15/20 1151 03/15/20 1352   03/15/20 1400  cefTRIAXone (ROCEPHIN) 1 g in sodium chloride 0.9 % 100 mL IVPB     1 g 200 mL/hr over 30 Minutes Intravenous Every 24 hours 03/15/20 1352     03/15/20 1030  vancomycin (VANCOCIN) IVPB 1000 mg/200 mL premix     1,000 mg 200 mL/hr over 60 Minutes Intravenous  Once 03/15/20 1016 03/15/20 1222   03/15/20 1030  ceFEPIme (MAXIPIME) 2 g in sodium chloride 0.9 % 100 mL IVPB     2 g 200 mL/hr over 30 Minutes Intravenous  Once 03/15/20 1016 03/15/20 1222   03/15/20 1030   azithromycin (ZITHROMAX) 500 mg in sodium chloride 0.9 % 250 mL IVPB     500 mg 250 mL/hr over 60 Minutes Intravenous  Once 03/15/20 1016 03/15/20 1223          Objective:   Vitals:   03/16/20 0734 03/16/20 1818 03/16/20 2019 03/17/20 0741  BP: 140/90 (!) 157/97 (!) 160/78 (!) 183/88  Pulse: 68 92 88 67  Resp: 18 18 20 13   Temp: 97.7 F (36.5 C) 99 F (37.2 C) 98.8 F (37.1 C) 97.9 F (36.6 C)  TempSrc: Oral Oral Oral Oral  SpO2: 99% 100% 94% 96%  Weight:      Height:        SpO2: 96 %  Wt Readings from Last 3 Encounters:  03/15/20 56.2 kg  03/12/20 56.2 kg  01/03/20 56.6 kg     Intake/Output Summary (Last 24 hours) at 03/17/2020 1344 Last data filed at 03/17/2020 0400 Gross per 24 hour  Intake 1533.23 ml  Output --  Net 1533.23 ml     Physical Exam  Awake yesterday, moves all 4 extremities and follows basic commands  [place].AT,PERRAL Supple Neck,No JVD, No cervical lymphadenopathy appriciated.  Symmetrical Chest wall movement, Good air movement bilaterally, CTAB RRR,No Gallops, Rubs or new Murmurs, No Parasternal Heave +ve B.Sounds, Abd Soft, No tenderness, No organomegaly appriciated, No rebound - guarding or rigidity. No Cyanosis, Clubbing or edema, No new Rash or bruise      Data Review:    Recent Labs  Lab 03/15/20 1038 03/16/20 0337 03/17/20  0254  WBC 18.5* 15.8* 10.3  HGB 14.6 11.6* 11.0*  HCT 46.7* 37.1 35.3*  PLT 319 266 248  MCV 95.7 94.4 94.4  MCH 29.9 29.5 29.4  MCHC 31.3 31.3 31.2  RDW 14.6 14.4 14.4  LYMPHSABS 0.9  --  1.3  MONOABS 1.7*  --  1.2*  EOSABS 0.0  --  0.6*  BASOSABS 0.1  --  0.1    Recent Labs  Lab 03/15/20 1038 03/16/20 0337 03/17/20 0254  NA 135 139 136  K 4.5 3.8 3.0*  CL 98 104 103  CO2 22 26 23   GLUCOSE 120* 92 85  BUN 12 13 9   CREATININE 0.84 0.80 0.79  CALCIUM 8.9 8.4* 8.2*  AST 27  --  16  ALT 20  --  15  ALKPHOS 73  --  57  BILITOT 1.0  --  0.7  ALBUMIN 3.3*  --  2.4*  MG  --  1.7 1.5*  INR  1.3*  --   --   BNP  --  455.5* 383.2*    Recent Labs  Lab 03/15/20 1546 03/16/20 0337 03/17/20 0254  BNP  --  455.5* 383.2*  SARSCOV2NAA NEGATIVE  --   --     ------------------------------------------------------------------------------------------------------------------ No results for input(s): CHOL, HDL, LDLCALC, TRIG, CHOLHDL, LDLDIRECT in the last 72 hours.  Lab Results  Component Value Date   HGBA1C 6.7 (H) 12/08/2019   ------------------------------------------------------------------------------------------------------------------ No results for input(s): TSH, T4TOTAL, T3FREE, THYROIDAB in the last 72 hours.  Invalid input(s): FREET3 ------------------------------------------------------------------------------------------------------------------ No results for input(s): VITAMINB12, FOLATE, FERRITIN, TIBC, IRON, RETICCTPCT in the last 72 hours.  Coagulation profile Recent Labs  Lab 03/15/20 1038  INR 1.3*    No results for input(s): DDIMER in the last 72 hours.  Cardiac Enzymes No results for input(s): CKMB, TROPONINI, MYOGLOBIN in the last 168 hours.  Invalid input(s): CK ------------------------------------------------------------------------------------------------------------------    Component Value Date/Time   BNP 383.2 (H) 03/17/2020 0254    Micro Results Recent Results (from the past 240 hour(s))  Blood Culture (routine x 2)     Status: None (Preliminary result)   Collection Time: 03/15/20 10:38 AM   Specimen: BLOOD RIGHT HAND  Result Value Ref Range Status   Specimen Description BLOOD RIGHT HAND  Final   Special Requests   Final    BOTTLES DRAWN AEROBIC AND ANAEROBIC Blood Culture adequate volume   Culture   Final    NO GROWTH 2 DAYS Performed at [facility], [address], [place], [place] [address]    Report Status PENDING  Incomplete  Urine culture     Status: Abnormal (Preliminary result)   Collection Time: 03/15/20 10:38 AM     Specimen: In/Out Cath Urine  Result Value Ref Range Status   Specimen Description IN/OUT CATH URINE  Final   Special Requests   Final    NONE Performed at [facility], [place] [address], [place], [place] [address]    Culture (A)  Final    >=100,000 COLONIES/mL ESCHERICHIA COLI 80,000 COLONIES/mL PROTEUS MIRABILIS    Report Status PENDING  Incomplete   Organism ID, Bacteria ESCHERICHIA COLI (A)  Final      Susceptibility   Escherichia coli - MIC*    AMPICILLIN <=2 SENSITIVE Sensitive     CEFAZOLIN <=4 SENSITIVE Sensitive     CEFTRIAXONE <=0.25 SENSITIVE Sensitive     CIPROFLOXACIN <=0.25 SENSITIVE Sensitive     GENTAMICIN <=1 SENSITIVE Sensitive     IMIPENEM <=  0.25 SENSITIVE Sensitive     NITROFURANTOIN <=16 SENSITIVE Sensitive     TRIMETH/SULFA <=20 SENSITIVE Sensitive     AMPICILLIN/SULBACTAM <=2 SENSITIVE Sensitive     PIP/TAZO <=4 SENSITIVE Sensitive     * >=100,000 COLONIES/mL ESCHERICHIA COLI  Blood Culture (routine x 2)     Status: None (Preliminary result)   Collection Time: 03/15/20 10:45 AM   Specimen: BLOOD  Result Value Ref Range Status   Specimen Description BLOOD SITE NOT SPECIFIED  Final   Special Requests   Final    BOTTLES DRAWN AEROBIC AND ANAEROBIC Blood Culture results may not be optimal due to an inadequate volume of blood received in culture bottles   Culture   Final    NO GROWTH 2 DAYS Performed at [facility], [address] [place], [place] [address]    Report Status PENDING  Incomplete  MRSA PCR Screening     Status: None   Collection Time: 03/15/20  3:09 PM   Specimen: Nasopharyngeal Wash  Result Value Ref Range Status   MRSA by PCR NEGATIVE NEGATIVE Final    Comment:        The GeneXpert MRSA Assay (FDA approved for NASAL specimens only), is one component of a comprehensive MRSA colonization surveillance program. It is not intended to diagnose MRSA infection nor to guide or monitor treatment for MRSA infections. Performed  at [facility], [address] [place], [place] [address]   SARS CORONAVIRUS 2 (TAT 6-24 HRS) Nasopharyngeal Nasopharyngeal Swab     Status: None   Collection Time: 03/15/20  3:46 PM   Specimen: Nasopharyngeal Swab  Result Value Ref Range Status   SARS Coronavirus 2 NEGATIVE NEGATIVE Final    Comment: (NOTE) SARS-CoV-2 target nucleic acids are NOT DETECTED. The SARS-CoV-2 RNA is generally detectable in upper and lower respiratory specimens during the acute phase of infection. Negative results do not preclude SARS-CoV-2 infection, do not rule out co-infections with other pathogens, and should not be used as the sole basis for treatment or other patient management decisions. Negative results must be combined with clinical observations, patient history, and epidemiological information. The expected result is Negative. Fact Sheet for Patients: HairSlick.no Fact Sheet for Healthcare Providers: quierodirigir.com This test is not yet approved or cleared by the [place] FDA and  has been authorized for detection and/or diagnosis of SARS-CoV-2 by FDA under an Emergency Use Authorization (EUA). This EUA will remain  in effect (meaning this test can be used) for the duration of the COVID-19 declaration under Section 56 4(b)(1) of the Act, 21 U.S.C. section 360bbb-3(b)(1), unless the authorization is terminated or revoked sooner. Performed at [facility], [address], [place], [place] [address]     Radiology Reports DG Chest [place] 1 View  Result Date: 03/15/2020 CLINICAL DATA:  Acute mental status change.  Fever. EXAM: PORTABLE CHEST 1 VIEW COMPARISON:  December 30, 2019 FINDINGS: There is persistent opacity in the right base which has improved. Opacity in the lateral left mid lung is similar to mildly improved. The heart, hila, mediastinum, lungs, and pleura are otherwise unchanged. IMPRESSION: Improving opacity in the  right base in the left lateral mid lung. However, infiltrate does remain in these locations. No other acute abnormalities. Electronically Signed   By: Gerome Sam III M.D   On: 03/15/2020 12:40    Time Spent in minutes  30   Susa Raring M.D on 03/17/2020 at 1:44 PM  To page go  to [email or web address] - password [facility]

## 2020-03-18 DIAGNOSIS — N390 Urinary tract infection, site not specified: Secondary | ICD-10-CM | POA: Diagnosis not present

## 2020-03-18 DIAGNOSIS — A419 Sepsis, unspecified organism: Secondary | ICD-10-CM | POA: Diagnosis not present

## 2020-03-18 DIAGNOSIS — R4182 Altered mental status, unspecified: Secondary | ICD-10-CM | POA: Diagnosis not present

## 2020-03-18 LAB — CBC WITH DIFFERENTIAL/PLATELET
Abs Immature Granulocytes: 0.05 10*3/uL (ref 0.00–0.07)
Basophils Absolute: 0.1 10*3/uL (ref 0.0–0.1)
Basophils Relative: 1 %
Eosinophils Absolute: 0.5 10*3/uL (ref 0.0–0.5)
Eosinophils Relative: 6 %
HCT: 37.5 % (ref 36.0–46.0)
Hemoglobin: 11.9 g/dL — ABNORMAL LOW (ref 12.0–15.0)
Immature Granulocytes: 1 %
Lymphocytes Relative: 16 %
Lymphs Abs: 1.3 10*3/uL (ref 0.7–4.0)
MCH: 29.2 pg (ref 26.0–34.0)
MCHC: 31.7 g/dL (ref 30.0–36.0)
MCV: 91.9 fL (ref 80.0–100.0)
Monocytes Absolute: 1 10*3/uL (ref 0.1–1.0)
Monocytes Relative: 12 %
Neutro Abs: 5.2 10*3/uL (ref 1.7–7.7)
Neutrophils Relative %: 64 %
Platelets: 294 10*3/uL (ref 150–400)
RBC: 4.08 MIL/uL (ref 3.87–5.11)
RDW: 14.2 % (ref 11.5–15.5)
WBC: 8.1 10*3/uL (ref 4.0–10.5)
nRBC: 0 % (ref 0.0–0.2)

## 2020-03-18 LAB — MAGNESIUM: Magnesium: 1.9 mg/dL (ref 1.7–2.4)

## 2020-03-18 LAB — COMPREHENSIVE METABOLIC PANEL
ALT: 18 U/L (ref 0–44)
AST: 23 U/L (ref 15–41)
Albumin: 2.6 g/dL — ABNORMAL LOW (ref 3.5–5.0)
Alkaline Phosphatase: 53 U/L (ref 38–126)
Anion gap: 11 (ref 5–15)
BUN: 6 mg/dL — ABNORMAL LOW (ref 8–23)
CO2: 24 mmol/L (ref 22–32)
Calcium: 8.5 mg/dL — ABNORMAL LOW (ref 8.9–10.3)
Chloride: 101 mmol/L (ref 98–111)
Creatinine, Ser: 0.57 mg/dL (ref 0.44–1.00)
GFR calc Af Amer: >60 mL/min (ref >60)
GFR calc non Af Amer: >60 mL/min (ref >60)
Glucose, Bld: 86 mg/dL (ref 70–99)
Potassium: 3.9 mmol/L (ref 3.5–5.1)
Sodium: 136 mmol/L (ref 135–145)
Total Bilirubin: 0.4 mg/dL (ref 0.3–1.2)
Total Protein: 6 g/dL — ABNORMAL LOW (ref 6.5–8.1)

## 2020-03-18 LAB — URINE CULTURE: Culture: >=100000 — AB

## 2020-03-18 MED ORDER — CEPHALEXIN 500 MG PO CAPS: 500.0000 mg | ORAL_CAPSULE | Freq: Two times a day (BID) | ORAL | 0 refills | Status: AC

## 2020-03-18 MED ORDER — AMLODIPINE BESYLATE 10 MG PO TABS
10.0000 mg | ORAL_TABLET | Freq: Every day | ORAL | Status: DC
  Administered 2020-03-18: 10 mg via ORAL
  Filled 2020-03-18: qty 1

## 2020-03-18 NOTE — Progress Notes (Signed)
Pt BP elevated, 181/101.  She has metoprolol on her D/C med rec but we have not been giving it to her here.  MD made aware, advised to print AVS and then place verbal order for one time dose of norvasc, 10mg  p.o.

## 2020-03-18 NOTE — Progress Notes (Signed)
Called [facility] to give report, transferred to voicemail.  Left my name and number for call back.

## 2020-03-18 NOTE — Discharge Summary (Signed)
Shirley BealBarbara B [name] ZOX:[id number] DOB: 06-17-1934 DOA: 03/15/2020  PCP: Roderick Peeodd, Jeffrey A, MD (Inactive)  Admit date: 03/15/2020  Discharge date: 03/18/2020  Admitted From: ALF  Disposition:  ALF   Recommendations for Outpatient Follow-up:   Follow up with PCP in 1-2 weeks  PCP Please obtain BMP/CBC, 2 view CXR in 1week,  (see Discharge instructions)   PCP Please follow up on the following pending results:    Home Health: PT,RN   Equipment/Devices: None  Consultations: None  Discharge Condition: Stable    CODE STATUS: DNR   Diet Recommendation: Heart Healthy - Soft    Chief Complaint  Patient presents with  . Altered Mental Status     Brief history of present illness from the day of admission and additional interim summary    Shirley MinksBarbara B [name] a 86 y.o.femalewith medical history significant ofhypertension, polymyalgia rheumatica, dementia, SVT, recent COVID 19 PNA andCovid provoked PEin Jan 2021 presented with AMS on top of her underlying mild dementia.  She was diagnosed with sepsis due to UTI and admitted to the hospital.                                                                 Hospital Course    1.  Sepsis due to e.Cli UTI along with some dehydration.  Responded well to IV fluids and Rocephin, sepsis pathophysiology has resolved, is close to her baseline, will be placed on 3 more doses of oral Keflex and discharged back to ALF with follow-up with PCP, also discussed her case with her son apparently this is her sixth episode of UTI in the last 1 year, will have her follow-up with urology one time, PCP can consider intermittent chronic oral antibiotics if benefits outweigh risks.  Explained this to the son that chronic antibiotic therapy is not benign by itself..  2.  Recently treated COVID-19  infection.  No acute issue.  3.  Recent PE.  On Eliquis.    PCP to monitor and adjust the dose as appropriate.  4.  Underlying dementia.    Mild delirium/metabolic encephalopathy while in the hospital, continue home medications, continue to minimize narcotics and benzodiazepines.  5.  History of PMR.  Supportive care and monitor.   Discharge diagnosis     Active Problems:   Acute lower UTI   Sepsis (HCC)   Pressure injury of skin    Discharge instructions    Discharge Instructions    Discharge instructions   Complete by: As directed    Follow with Primary MD Roderick Peeodd, Jeffrey A, MD (Inactive) in 7 days   Get CBC, CMP, 2 view Chest X ray -  checked next visit within 1 week by Primary MD or ALF MD   Activity: As tolerated with Full fall precautions use walker/cane & assistance  as needed  Disposition ALF  Diet: Soft Heart Healthy  with feeding assistance and aspiration precautions.    Special Instructions: If you have smoked or chewed Tobacco  in the last 2 yrs please stop smoking, stop any regular Alcohol  and or any Recreational drug use.  On your next visit with your primary care physician please Get Medicines reviewed and adjusted.  Please request your Prim.MD to go over all Hospital Tests and Procedure/Radiological results at the follow up, please get all Hospital records sent to your Prim MD by signing hospital release before you go home.  If you experience worsening of your admission symptoms, develop shortness of breath, life threatening emergency, suicidal or homicidal thoughts you must seek medical attention immediately by calling 911 or calling your MD immediately  if symptoms less severe.  You Must read complete instructions/literature along with all the possible adverse reactions/side effects for all the Medicines you take and that have been prescribed to you. Take any new Medicines after you have completely understood and accpet all the possible adverse  reactions/side effects.   Increase activity slowly   Complete by: As directed       Discharge Medications   Allergies as of 03/18/2020   No Known Allergies     Medication List    TAKE these medications   albuterol 108 (90 Base) MCG/ACT inhaler Commonly known as: VENTOLIN HFA Inhale 2 puffs into the lungs every 4 (four) hours as needed for wheezing or shortness of breath.   ascorbic acid 500 MG tablet Commonly known as: VITAMIN C Take 500 mg by mouth 2 (two) times daily.   cephALEXin 500 MG capsule Commonly known as: KEFLEX Take 1 capsule (500 mg total) by mouth 2 (two) times daily for 3 days.   citalopram 10 MG tablet Commonly known as: CELEXA TAKE 2 TABLETS EVERY DAY What changed: how much to take   donepezil 10 MG tablet Commonly known as: ARICEPT TAKE 1 TABLET EVERY DAY What changed:   how much to take  how to take this  when to take this   Eliquis 2.5 MG Tabs tablet Generic drug: apixaban Take 2.5 mg by mouth 2 (two) times daily. What changed: Another medication with the same name was removed. Continue taking this medication, and follow the directions you see here.   metoprolol tartrate 50 MG tablet Commonly known as: LOPRESSOR Take 1 tablet (50 mg total) by mouth 2 (two) times daily.   Minerin Creme Crea Apply 1 application topically every 12 (twelve) hours as needed (to affected area(s)- for rashes).   multivitamin with minerals tablet Take 1 tablet by mouth daily.   neomycin-bacitracin-polymyxin [phone] ointment Apply 1 application topically in the morning and at bedtime. Apply to lower lip until healed   triamcinolone cream 0.1 % Commonly known as: KENALOG Compound 1;1 with Eucerin cream and apply bid prn rash.   vitamin B-12 1000 MCG tablet Commonly known as: CYANOCOBALAMIN Take 2 tablets (2,000 mcg total) by mouth daily.   Vitamin D-3 25 MCG (1000 UT) Caps Take 1,000 Units by mouth daily.       Follow-up Information    Roderick Pee, MD. Schedule an appointment as soon as possible for a visit in 1 week(s).   Specialty: Family Medicine       O'Neal, Ronnald Ramp, MD .   Specialties: Internal Medicine, Cardiology, Radiology Contact information: [address] [place] [address] [phone]        Sebastian Ache, MD.  Schedule an appointment as soon as possible for a visit in 1 week(s).   Specialty: Urology Why: Recurrent UTIs Contact information: [address] [place] [address] [phone]           Major procedures and Radiology Reports - PLEASE review detailed and final reports thoroughly  -       DG Chest Port 1 View  Result Date: 03/15/2020 CLINICAL DATA:  Acute mental status change.  Fever. EXAM: PORTABLE CHEST 1 VIEW COMPARISON:  December 30, 2019 FINDINGS: There is persistent opacity in the right base which has improved. Opacity in the lateral left mid lung is similar to mildly improved. The heart, hila, mediastinum, lungs, and pleura are otherwise unchanged. IMPRESSION: Improving opacity in the right base in the left lateral mid lung. However, infiltrate does remain in these locations. No other acute abnormalities. Electronically Signed   By: Gerome Sam III M.D   On: 03/15/2020 12:40    Micro Results     Recent Results (from the past 240 hour(s))  Blood Culture (routine x 2)     Status: None (Preliminary result)   Collection Time: 03/15/20 10:38 AM   Specimen: BLOOD RIGHT HAND  Result Value Ref Range Status   Specimen Description BLOOD RIGHT HAND  Final   Special Requests   Final    BOTTLES DRAWN AEROBIC AND ANAEROBIC Blood Culture adequate volume   Culture   Final    NO GROWTH 2 DAYS Performed at [name], [address] [place], [place] [address]    Report Status PENDING  Incomplete  Urine culture     Status: Abnormal   Collection Time: 03/15/20 10:38 AM   Specimen: In/Out Cath Urine  Result Value Ref Range Status   Specimen Description IN/OUT  CATH URINE  Final   Special Requests   Final    NONE Performed at [facility], [address], [place], [place] [address]    Culture (A)  Final    >=100,000 COLONIES/mL ESCHERICHIA COLI 80,000 COLONIES/mL PROTEUS MIRABILIS    Report Status 03/18/2020 FINAL  Final   Organism ID, Bacteria ESCHERICHIA COLI (A)  Final   Organism ID, Bacteria PROTEUS MIRABILIS (A)  Final      Susceptibility   Escherichia coli - MIC*    AMPICILLIN <=2 SENSITIVE Sensitive     CEFAZOLIN <=4 SENSITIVE Sensitive     CEFTRIAXONE <=0.25 SENSITIVE Sensitive     CIPROFLOXACIN <=0.25 SENSITIVE Sensitive     GENTAMICIN <=1 SENSITIVE Sensitive     IMIPENEM <=0.25 SENSITIVE Sensitive     NITROFURANTOIN <=16 SENSITIVE Sensitive     TRIMETH/SULFA <=20 SENSITIVE Sensitive     AMPICILLIN/SULBACTAM <=2 SENSITIVE Sensitive     PIP/TAZO <=4 SENSITIVE Sensitive     * >=100,000 COLONIES/mL ESCHERICHIA COLI   Proteus mirabilis - MIC*    AMPICILLIN <=2 SENSITIVE Sensitive     CEFAZOLIN <=4 SENSITIVE Sensitive     CEFTRIAXONE <=0.25 SENSITIVE Sensitive     CIPROFLOXACIN <=0.25 SENSITIVE Sensitive     GENTAMICIN <=1 SENSITIVE Sensitive     IMIPENEM 1 SENSITIVE Sensitive     NITROFURANTOIN 128 RESISTANT Resistant     TRIMETH/SULFA <=20 SENSITIVE Sensitive     AMPICILLIN/SULBACTAM <=2 SENSITIVE Sensitive     PIP/TAZO <=4 SENSITIVE Sensitive     * 80,000 COLONIES/mL PROTEUS MIRABILIS  Blood Culture (routine x 2)     Status: None (Preliminary result)   Collection Time: 03/15/20 10:45 AM   Specimen:  BLOOD  Result Value Ref Range Status   Specimen Description BLOOD SITE NOT SPECIFIED  Final   Special Requests   Final    BOTTLES DRAWN AEROBIC AND ANAEROBIC Blood Culture results may not be optimal due to an inadequate volume of blood received in culture bottles   Culture   Final    NO GROWTH 2 DAYS Performed at [facility], [place] [address] [place], [place] [address]    Report Status PENDING  Incomplete  MRSA  PCR Screening     Status: None   Collection Time: 03/15/20  3:09 PM   Specimen: Nasopharyngeal Wash  Result Value Ref Range Status   MRSA by PCR NEGATIVE NEGATIVE Final    Comment:        The GeneXpert MRSA Assay (FDA approved for NASAL specimens only), is one component of a comprehensive MRSA colonization surveillance program. It is not intended to diagnose MRSA infection nor to guide or monitor treatment for MRSA infections. Performed at [facility], [address], [place], [place] [address]   SARS CORONAVIRUS 2 (TAT 6-24 HRS) Nasopharyngeal Nasopharyngeal Swab     Status: None   Collection Time: 03/15/20  3:46 PM   Specimen: Nasopharyngeal Swab  Result Value Ref Range Status   SARS Coronavirus 2 NEGATIVE NEGATIVE Final    Comment: (NOTE) SARS-CoV-2 target nucleic acids are NOT DETECTED. The SARS-CoV-2 RNA is generally detectable in upper and lower respiratory specimens during the acute phase of infection. Negative results do not preclude SARS-CoV-2 infection, do not rule out co-infections with other pathogens, and should not be used as the sole basis for treatment or other patient management decisions. Negative results must be combined with clinical observations, patient history, and epidemiological information. The expected result is Negative. Fact Sheet for Patients: SugarRoll.be Fact Sheet for Healthcare Providers: https://www.woods-mathews.com/ This test is not yet approved or cleared by the [place] FDA and  has been authorized for detection and/or diagnosis of SARS-CoV-2 by FDA under an Emergency Use Authorization (EUA). This EUA will remain  in effect (meaning this test can be used) for the duration of the COVID-19 declaration under Section 56 4(b)(1) of the Act, 21 U.S.C. section 360bbb-3(b)(1), unless the authorization is terminated or revoked sooner. Performed at [facility], [place] [address]  [place], [place] [address]     Today   Subjective    Shirley [name] today has no headache,no chest abdominal pain,no new weakness tingling or numbness, feels much better     Objective   Blood pressure (!) 144/99, pulse 71, temperature 97.9 F (36.6 C), temperature source Axillary, resp. rate 18, height 5\' 3"  (1.6 m), weight 56.2 kg, SpO2 96 %.   Intake/Output Summary (Last 24 hours) at 03/18/2020 0953 Last data filed at 03/18/2020 0501 Gross per 24 hour  Intake --  Output 650 ml  Net -650 ml    Exam Awake , mildly confused, No new F.N deficits,   [place].AT,PERRAL Supple Neck,No JVD, No cervical lymphadenopathy appriciated.  Symmetrical Chest wall movement, Good air movement bilaterally, CTAB RRR,No Gallops,Rubs or new Murmurs, No Parasternal Heave +ve B.Sounds, Abd Soft, Non tender, No organomegaly appriciated, No rebound -guarding or rigidity. No Cyanosis, Clubbing or edema, No new Rash or bruise   Data Review   CBC w Diff:  Lab Results  Component Value Date   WBC 8.1 03/18/2020   HGB 11.9 (L) 03/18/2020   HCT 37.5 03/18/2020   PLT 294 03/18/2020   LYMPHOPCT  16 03/18/2020   MONOPCT 12 03/18/2020   EOSPCT 6 03/18/2020   BASOPCT 1 03/18/2020    CMP:  Lab Results  Component Value Date   NA 136 03/18/2020   K 3.9 03/18/2020   CL 101 03/18/2020   CO2 24 03/18/2020   BUN 6 (L) 03/18/2020   CREATININE 0.57 03/18/2020   PROT 6.0 (L) 03/18/2020   ALBUMIN 2.6 (L) 03/18/2020   BILITOT 0.4 03/18/2020   ALKPHOS 53 03/18/2020   AST 23 03/18/2020   ALT 18 03/18/2020  .   Total Time in preparing paper work, data evaluation and todays exam - 35 minutes  Susa Raring M.D on 03/18/2020 at 9:53 AM  [facility]   Office  [phone]

## 2020-03-18 NOTE — Discharge Instructions (Signed)
Follow with Primary MD Roderick Pee, MD (Inactive) in 7 days   Get CBC, CMP, 2 view Chest X ray -  checked next visit within 1 week by Primary MD or ALF MD   Activity: As tolerated with Full fall precautions use walker/cane & assistance as needed  Disposition ALF  Diet: Soft Heart Healthy  with feeding assistance and aspiration precautions.    Special Instructions: If you have smoked or chewed Tobacco  in the last 2 yrs please stop smoking, stop any regular Alcohol  and or any Recreational drug use.  On your next visit with your primary care physician please Get Medicines reviewed and adjusted.  Please request your Prim.MD to go over all Hospital Tests and Procedure/Radiological results at the follow up, please get all Hospital records sent to your Prim MD by signing hospital release before you go home.  If you experience worsening of your admission symptoms, develop shortness of breath, life threatening emergency, suicidal or homicidal thoughts you must seek medical attention immediately by calling 911 or calling your MD immediately  if symptoms less severe.  You Must read complete instructions/literature along with all the possible adverse reactions/side effects for all the Medicines you take and that have been prescribed to you. Take any new Medicines after you have completely understood and accpet all the possible adverse reactions/side effects.

## 2020-03-18 NOTE — TOC Transition Note (Addendum)
Transition of Care [facility]) - CM/SW Discharge Note   Patient Details  Name: Shirley [name] MRN: [id number] Date of Birth: June 10, 1934  Transition of [facility]) CM/SW Contact:  Eduard Roux, LCSWA Phone Number: 03/18/2020, 12:15 PM   Clinical Narrative:     Patient will DC to: [facility]  DC Date: 03/18/2020 Family Notified: Tawanna Cooler, son Transport By: Sharin Mons  RN, patient, and facility notified of DC. Discharge Summary sent to facility. RN given number for [phone]. Ambulance transport requested for patient.   Clinical Social Worker signing off.  Antony Blackbird, MSW, LCSWA Clinical Social Worker    Final next level of care: Assisted Living Barriers to Discharge: No Barriers Identified   Patient Goals and CMS Choice        Discharge Placement              Patient chooses bed at: [facility]) Patient to be transferred to facility by: [facility] Name of family member notified: Tawanna Cooler, son Patient and family notified of of transfer: 03/18/20  Discharge Plan and Services In-house Referral: Clinical Social Work                                   Social Determinants of Health (SDOH) Interventions     Readmission Risk Interventions Readmission Risk Prevention Plan 12/10/2019  Transportation Screening Complete  Home Care Screening Not Complete  Home Care Screening Not Completed Comments Returning to SNF  Medication Review (RN CM) Complete  Some recent data might be hidden

## 2020-03-18 NOTE — Care Management Important Message (Signed)
Important Message  Patient Details  Name: Shirley [name] MRN: [id number] Date of Birth: 1934/07/25   Medicare Important Message Given:  Yes     Renie Ora 03/18/2020, 10:10 AM

## 2020-03-18 NOTE — NC FL2 (Signed)
[place] MEDICAID FL2 LEVEL OF CARE SCREENING TOOL     IDENTIFICATION  Patient Name: Shirley [name] Birthdate: 09/09/1934 Sex: female Admission Date (Current Location): 03/15/2020  [facility] [place] and [place] Number:  Herbalist and Address:  [facility], [place] [address], [place], [place] [address]      Provider Number: [id number]  Attending Physician Name and Address:  Thurnell Lose, MD  Relative Name and Phone Number:       Current Level of Care: Hospital Recommended Level of Care: [place], Memory Care Prior Approval Number:    Date Approved/Denied:   PASRR Number:    Discharge Plan: Other (Comment)(ALF Memory Care)    Current Diagnoses: Patient Active Problem List   Diagnosis Date Noted  . Pressure injury of skin 03/16/2020  . Acute lower UTI 03/15/2020  . Sepsis ([place]) 03/15/2020  . New onset atrial fibrillation ([place]) 12/31/2019  . Pneumonia 12/31/2019  . Dementia with behavioral disturbance ([place])   . Pneumonia due to COVID-19 virus 12/03/2019  . Hypernatremia   . HCAP (healthcare-associated pneumonia) 12/02/2019  . Palliative care by specialist   . Goals of care, counseling/discussion   . DNR (do not resuscitate)   . Acute encephalopathy 11/23/2019  . Ascending aortic aneurysm ([place]) 11/23/2019  . Moderate dementia ([place]) 08/10/2015  . Frequent falls 05/08/2015  . Anxiety 05/08/2015  . Anxiety state, unspecified 03/06/2014  . Short-term memory loss 12/30/2013  . Lumbar degenerative disc disease 08/22/2011  . Polymyalgia rheumatica ([place]) 05/16/2008  . Essential hypertension 03/12/2008  . Arthritis, senescent 03/12/2008  . MYALGIA 03/12/2008  . OSTEOPOROSIS 03/12/2008    Orientation RESPIRATION BLADDER Height & Weight     Self  Normal External catheter, Incontinent Weight: 123 lb 14.4 oz (56.2 kg) Height:  5\' 3"  (160 cm)  BEHAVIORAL SYMPTOMS/MOOD NEUROLOGICAL BOWEL NUTRITION STATUS      Incontinent  Diet(low sodium- heart healthy)  AMBULATORY STATUS COMMUNICATION OF NEEDS Skin     Verbally Other (Comment)(pressure injury buttoks left stage II, pressure injury buttocks,left,right,stage II)                       Personal Care Assistance Level of Assistance  Bathing, Feeding, Dressing Bathing Assistance: Maximum assistance Feeding assistance: Independent Dressing Assistance: Maximum assistance     Functional Limitations Info  Sight, Hearing, Speech Sight Info: Adequate Hearing Info: Adequate Speech Info: Adequate    SPECIAL CARE FACTORS FREQUENCY  PT (By licensed PT)     PT Frequency: 2x per week              Contractures Contractures Info: Not present    Additional Factors Info  Code Status, Allergies Code Status Info: DNR Allergies Info: NKA           Current Medications (03/18/2020):  This is the current hospital active medication list Current Facility-Administered Medications  Medication Dose Route Frequency Provider Last Rate Last Admin  . acetaminophen (TYLENOL) tablet 650 mg  650 mg Oral Q6H PRN Lequita Halt, MD   650 mg at 03/15/20 1923  . albuterol (PROVENTIL) (2.5 MG/3ML) 0.083% nebulizer solution 2.5 mg  2.5 mg Inhalation Q4H PRN Wynetta Fines T, MD      . apixaban Arne Cleveland) tablet 5 mg  5 mg Oral BID Thurnell Lose, MD   5 mg at 03/18/20 0946  . ascorbic acid (VITAMIN C) tablet 500 mg  500 mg Oral BID Lequita Halt, MD  500 mg at 03/18/20 0946  . cefTRIAXone (ROCEPHIN) 1 g in sodium chloride 0.9 % 100 mL IVPB  1 g Intravenous Q24H Emeline General, MD   Stopped at 03/17/20 1452  . cholecalciferol (VITAMIN D3) tablet 1,000 Units  1,000 Units Oral Daily Emeline General, MD   1,000 Units at 03/18/20 0946  . citalopram (CELEXA) tablet 10 mg  10 mg Oral Daily Leroy Sea, MD   10 mg at 03/18/20 0947  . donepezil (ARICEPT) tablet 10 mg  10 mg Oral QHS Mikey College T, MD   10 mg at 03/17/20 2209  . fluticasone (FLONASE) 50 MCG/ACT nasal spray 2 spray   2 spray Each Nare Daily Emeline General, MD   2 spray at 03/18/20 0948  . lactobacillus acidophilus (BACID) tablet 2 tablet  2 tablet Oral TID Emeline General, MD   2 tablet at 03/18/20 0946  . metoprolol tartrate (LOPRESSOR) injection 2.5 mg  2.5 mg Intravenous Q6H PRN Mikey College T, MD   2.5 mg at 03/15/20 1846  . ondansetron (ZOFRAN) injection 4 mg  4 mg Intravenous Q6H PRN Leroy Sea, MD      . triamcinolone cream (KENALOG) 0.1 %   Topical BID PRN Mikey College T, MD      . vitamin B-12 (CYANOCOBALAMIN) tablet 2,000 mcg  2,000 mcg Oral Daily Mikey College T, MD   2,000 mcg at 03/18/20 0947  . zinc sulfate capsule 220 mg  220 mg Oral Daily Mikey College T, MD   220 mg at 03/18/20 6045     Discharge Medications: Please see discharge summary for a list of discharge medications.  Relevant Imaging Results:  Relevant Lab Results:   Additional Information SSN [id number]  Eduard Roux, LCSWA

## 2020-03-20 LAB — CULTURE, BLOOD (ROUTINE X 2)
Culture: NO GROWTH
Culture: NO GROWTH
Special Requests: ADEQUATE

## 2020-03-31 DIAGNOSIS — R4182 Altered mental status, unspecified: Secondary | ICD-10-CM

## 2020-04-07 ENCOUNTER — Ambulatory Visit: Payer: Medicare PPO | Admitting: Cardiovascular Disease

## 2020-09-07 NOTE — Progress Notes (Signed)
Cardiology Office Note:   Date:  09/10/2020  NAME:  Shirley BealBarbara B Catino    MRN: [id number] DOB:  1934/11/02   PCP:  Roderick Peeodd, Jeffrey A, MD (Inactive)  Cardiologist:  Reatha HarpsWesley T O'Neal, MD   Referring MD: No ref. provider found   Chief Complaint  Patient presents with  . Atrial Fibrillation   History of Present Illness:   Shirley [name] is a 86 y.o. female with a hx of atrial fibrillation, dementia, pulmonary embolism, thoracic aortic aneurysm who presents for follow-up.  She presents with her caregiver.  She is at [facility].  No issues reported.  No falls.  On Eliquis 2.5 mg twice daily.  Her pulse is regular on exam.  She is without complaints.  Cardiovascular exam is normal.  Appears to have no recurrence of her atrial fibrillation.  Denies any chest pain, shortness of breath or palpitations today.  I did update her son by phone today.  We decided continue medications.  Will not pursue any further surveillance of her ascending aortic aneurysm.  Blood pressure is 115/72.  No major complaints today.  Problem List 1. Afib 2/2 PE/covid 12/2019 -briefly on amiodarone -on eliquis -03/12/20 back in [facility] 2. Pulmonary embolism 3. Dementia 4. Ascending aortic aneurysm 4.9 cm 12/2019 -no following due to dementia  5. Descending aortic aneurysm 4.3 cm   Past Medical History: Past Medical History:  Diagnosis Date  . Arrhythmia   . Arthritis    "right shoulder; that's why I had to have it replaced"  . Hypertension   . Osteoarthritis   . Osteoporosis   . PMR (polymyalgia rheumatica) (HCC)     Past Surgical History: Past Surgical History:  Procedure Laterality Date  . CATARACT EXTRACTION W/ INTRAOCULAR LENS  IMPLANT, BILATERAL     2008   . HIP PINNING,CANNULATED Right 10/20/2013   Procedure: CANNULATED HIP PINNING;  Surgeon: Eldred MangesMark C Yates, MD;  Location: [facility];  Service: Orthopedics;  Laterality: Right;  . KYPHOPLASTY N/A 02/28/2013   Procedure: KYPHOPLASTY;  Surgeon:  Cristi LoronJeffrey D Jenkins, MD;  Location: [facility];  Service: Neurosurgery;  Laterality: N/A;  Lumbar two Kyphoplasty  . POSTERIOR FUSION LUMBAR SPINE  03/28/12  . SHOULDER ARTHROSCOPY W/ ROTATOR CUFF REPAIR  ~ 01/2011   right  . TOTAL SHOULDER REPLACEMENT  06/2011   right  . VAGINAL HYSTERECTOMY  1970's    Current Medications: No outpatient medications have been marked as taking for the 09/10/20 encounter (Office Visit) with Sande Rives'Neal, [place] Thomas, MD.     Allergies:    Patient has no known allergies.   Social History: Social History   Socioeconomic History  . Marital status: Married    Spouse name: Not on file  . Number of children: 3  . Years of education: Not on file  . Highest education level: Not on file  Occupational History  . Occupation: retired  Tobacco Use  . Smoking status: Former Smoker    Packs/day: 0.50    Years: 40.00    Pack years: 20.00    Types: Cigarettes    Quit date: 12/05/2013    Years since quitting: 6.7  . Smokeless tobacco: Never Used  Vaping Use  . Vaping Use: Never used  Substance and Sexual Activity  . Alcohol use: Never    Alcohol/week: 0.0 standard drinks    Comment: occ  . Drug use: No  . Sexual activity: Never  Other Topics Concern  . Not on file  Social History Narrative   Retired   Married   Alcohol use-no   Drug use-no   Regular Exercise-yes   Current Smoker         Social Determinants of Corporate investment banker Strain:   . Difficulty of Paying Living Expenses: Not on file  Food Insecurity:   . Worried About Programme researcher, broadcasting/film/video in the Last Year: Not on file  . Ran Out of Food in the Last Year: Not on file  Transportation Needs:   . Lack of Transportation (Medical): Not on file  . Lack of Transportation (Non-Medical): Not on file  Physical Activity:   . Days of Exercise per Week: Not on file  . Minutes of Exercise per Session: Not on file  Stress:   . Feeling of Stress : Not on file  Social Connections:   . Frequency  of Communication with Friends and Family: Not on file  . Frequency of Social Gatherings with Friends and Family: Not on file  . Attends Religious Services: Not on file  . Active Member of Clubs or Organizations: Not on file  . Attends Banker Meetings: Not on file  . Marital Status: Not on file     Family History: The patient's family history includes ALS in her father; Hypertension in her sister.  ROS:   All other ROS reviewed and negative. Pertinent positives noted in the HPI.     EKGs/Labs/Other Studies Reviewed:   The following studies were personally reviewed by me today:  TTE 12/31/2019 1. Left ventricular ejection fraction, by visual estimation, is 60 to  65%. The left ventricle has normal function. There is no left ventricular  hypertrophy.  2. Left ventricular diastolic function could not be evaluated.  3. The left ventricle has no regional wall motion abnormalities.  4. Global right ventricle has normal systolic function.The right  ventricular size is normal. No increase in right ventricular wall  thickness.  5. Left atrial size was severely dilated.  6. Right atrial size was normal.  7. The mitral valve is normal in structure. No evidence of mitral valve  regurgitation.  8. The tricuspid valve is normal in structure.  9. The tricuspid valve is normal in structure. Tricuspid valve  regurgitation is trivial.  10. The aortic valve is tricuspid. Aortic valve regurgitation is trivial.  Mild aortic valve sclerosis without stenosis.  11. The pulmonic valve was grossly normal. Pulmonic valve regurgitation is  not visualized.  12. Normal pulmonary artery systolic pressure.  13. The inferior vena cava is normal in size with greater than 50%  respiratory variability, suggesting right atrial pressure of 3 mmHg.   Recent Labs: 12/31/2019: TSH 1.397 03/17/2020: B Natriuretic Peptide 383.2 03/18/2020: ALT 18; BUN 6; Creatinine, Ser 0.57; Hemoglobin 11.9;  Magnesium 1.9; Platelets 294; Potassium 3.9; Sodium 136   Recent Lipid Panel    Component Value Date/Time   CHOL 152 08/23/2016 1008   TRIG 148.0 08/23/2016 1008   HDL 33.60 (L) 08/23/2016 1008   CHOLHDL 5 08/23/2016 1008   VLDL 29.6 08/23/2016 1008   LDLCALC 88 08/23/2016 1008    Physical Exam:   VS:  BP 115/72   Pulse 63   Temp (!) 93.2 F (34 C)   Ht 5\' 6"  (1.676 m)   Wt 126 lb (57.2 kg)   SpO2 95%   BMI 20.34 kg/m    Wt Readings from Last 3 Encounters:  09/10/20 126 lb (57.2 kg)  03/15/20 123 lb 14.4  oz (56.2 kg)  03/12/20 124 lb (56.2 kg)    General: Well nourished, well developed, in no acute distress Heart: Atraumatic, normal size  Eyes: PEERLA, EOMI  Neck: Supple, no JVD Endocrine: No thryomegaly Cardiac: Normal S1, S2; RRR; no murmurs, rubs, or gallops Lungs: Clear to auscultation bilaterally, no wheezing, rhonchi or rales  Abd: Soft, nontender, no hepatomegaly  Ext: No edema, pulses 2+ Musculoskeletal: No deformities, BUE and BLE strength normal and equal Skin: Warm and dry, no rashes   Neuro: Alert and oriented to person, place, time, and situation, CNII-XII grossly intact, no focal deficits  Psych: Normal mood and affect   ASSESSMENT:   JANALEE GROBE is a 86 y.o. female who presents for the following: 1. Persistent atrial fibrillation (HCC)   2. Ascending aortic aneurysm (HCC)   3. Pulmonary embolism without acute cor pulmonale, unspecified chronicity, unspecified pulmonary embolism type (HCC)     PLAN:   1. Persistent atrial fibrillation (HCC) -She had atrial fibrillation in the setting of COVID-19 pneumonia in January 2021.  Also complicated by pulmonary embolism.  She converted back to sinus rhythm.  We took her off amiodarone.  She will continue metoprolol 50 mg twice daily.  Also on Eliquis 2.5 mg twice daily.  No issues with this.  Likely should remain on this given her PE and A. fib.  No bleeding so okay to do.  2. Ascending aortic aneurysm  (HCC) -Ascending aortic aneurysm 4.9 cm when she had a CT PE study done.  I discussed with the son today that given her advanced dementia she is not a candidate for surgery.  I recommended to forego any further surveillance.  He is in agreement.  3. Pulmonary embolism without acute cor pulmonale, unspecified chronicity, unspecified pulmonary embolism type (HCC) -PE in January 2021.  We will continue Eliquis for now.  Had A. Fib.  Likely all related to COVID-19.  Given no bleeding issues we will continue Eliquis for now.  Should she run any bleeding issues I would recommend to stop this.  She has advanced dementia and aggressive care is not indicated.   Disposition: Return in about 1 year (around 09/10/2021).  Medication Adjustments/Labs and Tests Ordered: Current medicines are reviewed at length with the patient today.  Concerns regarding medicines are outlined above.  No orders of the defined types were placed in this encounter.  No orders of the defined types were placed in this encounter.   Patient Instructions  Medication Instructions:  The current medical regimen is effective;  continue present plan and medications.  *If you need a refill on your cardiac medications before your next appointment, please call your pharmacy*   Follow-Up: At [facility], you and your health needs are our priority.  As part of our continuing mission to provide you with exceptional heart care, we have created designated Provider Care Teams.  These Care Teams include your primary Cardiologist (physician) and Advanced Practice Providers (APPs -  Physician Assistants and Nurse Practitioners) who all work together to provide you with the care you need, when you need it.  We recommend signing up for the patient portal called "MyChart".  Sign up information is provided on this After Visit Summary.  MyChart is used to connect with patients for Virtual Visits (Telemedicine).  Patients are able to view lab/test  results, encounter notes, upcoming appointments, etc.  Non-urgent messages can be sent to your provider as well.   To learn more about what you can do with  MyChart, go to [email or web address].    Your next appointment:   12 month(s)  The format for your next appointment:   In Person  Provider:   Lennie Odor, MD          Time Spent with Patient: I have spent a total of 25 minutes with patient reviewing hospital notes, telemetry, EKGs, labs and examining the patient as well as establishing an assessment and plan that was discussed with the patient.  > 50% of time was spent in direct patient care.  Signed, Lenna Gilford. Flora Lipps, MD [facility]  [address] [place], [place] [address] [phone]  09/10/2020 11:46 AM

## 2020-09-10 ENCOUNTER — Other Ambulatory Visit: Payer: Self-pay

## 2020-09-10 ENCOUNTER — Encounter: Payer: Self-pay | Admitting: Cardiovascular Disease

## 2020-09-10 ENCOUNTER — Ambulatory Visit (INDEPENDENT_AMBULATORY_CARE_PROVIDER_SITE_OTHER): Payer: Medicare PPO | Admitting: Cardiovascular Disease

## 2020-09-10 VITALS — BP 115/72 | HR 63 | Temp 93.2°F | Ht 63.0 in | Wt 126.0 lb

## 2020-09-10 DIAGNOSIS — I4819 Other persistent atrial fibrillation: Secondary | ICD-10-CM

## 2020-09-10 DIAGNOSIS — I712 Thoracic aortic aneurysm, without rupture: Secondary | ICD-10-CM | POA: Diagnosis not present

## 2020-09-10 DIAGNOSIS — I7121 Aneurysm of the ascending aorta, without rupture: Secondary | ICD-10-CM

## 2020-09-10 DIAGNOSIS — I2699 Other pulmonary embolism without acute cor pulmonale: Secondary | ICD-10-CM | POA: Diagnosis not present

## 2020-09-10 NOTE — Patient Instructions (Signed)

## 2020-09-11 IMAGING — DX DG CHEST 1V PORT
1 series · 1 of 1 positions shown · non-contrast
Comparison: December 02, 2019.

CLINICAL DATA: Hypoxia.

EXAM:
PORTABLE CHEST 1 VIEW

[chest]
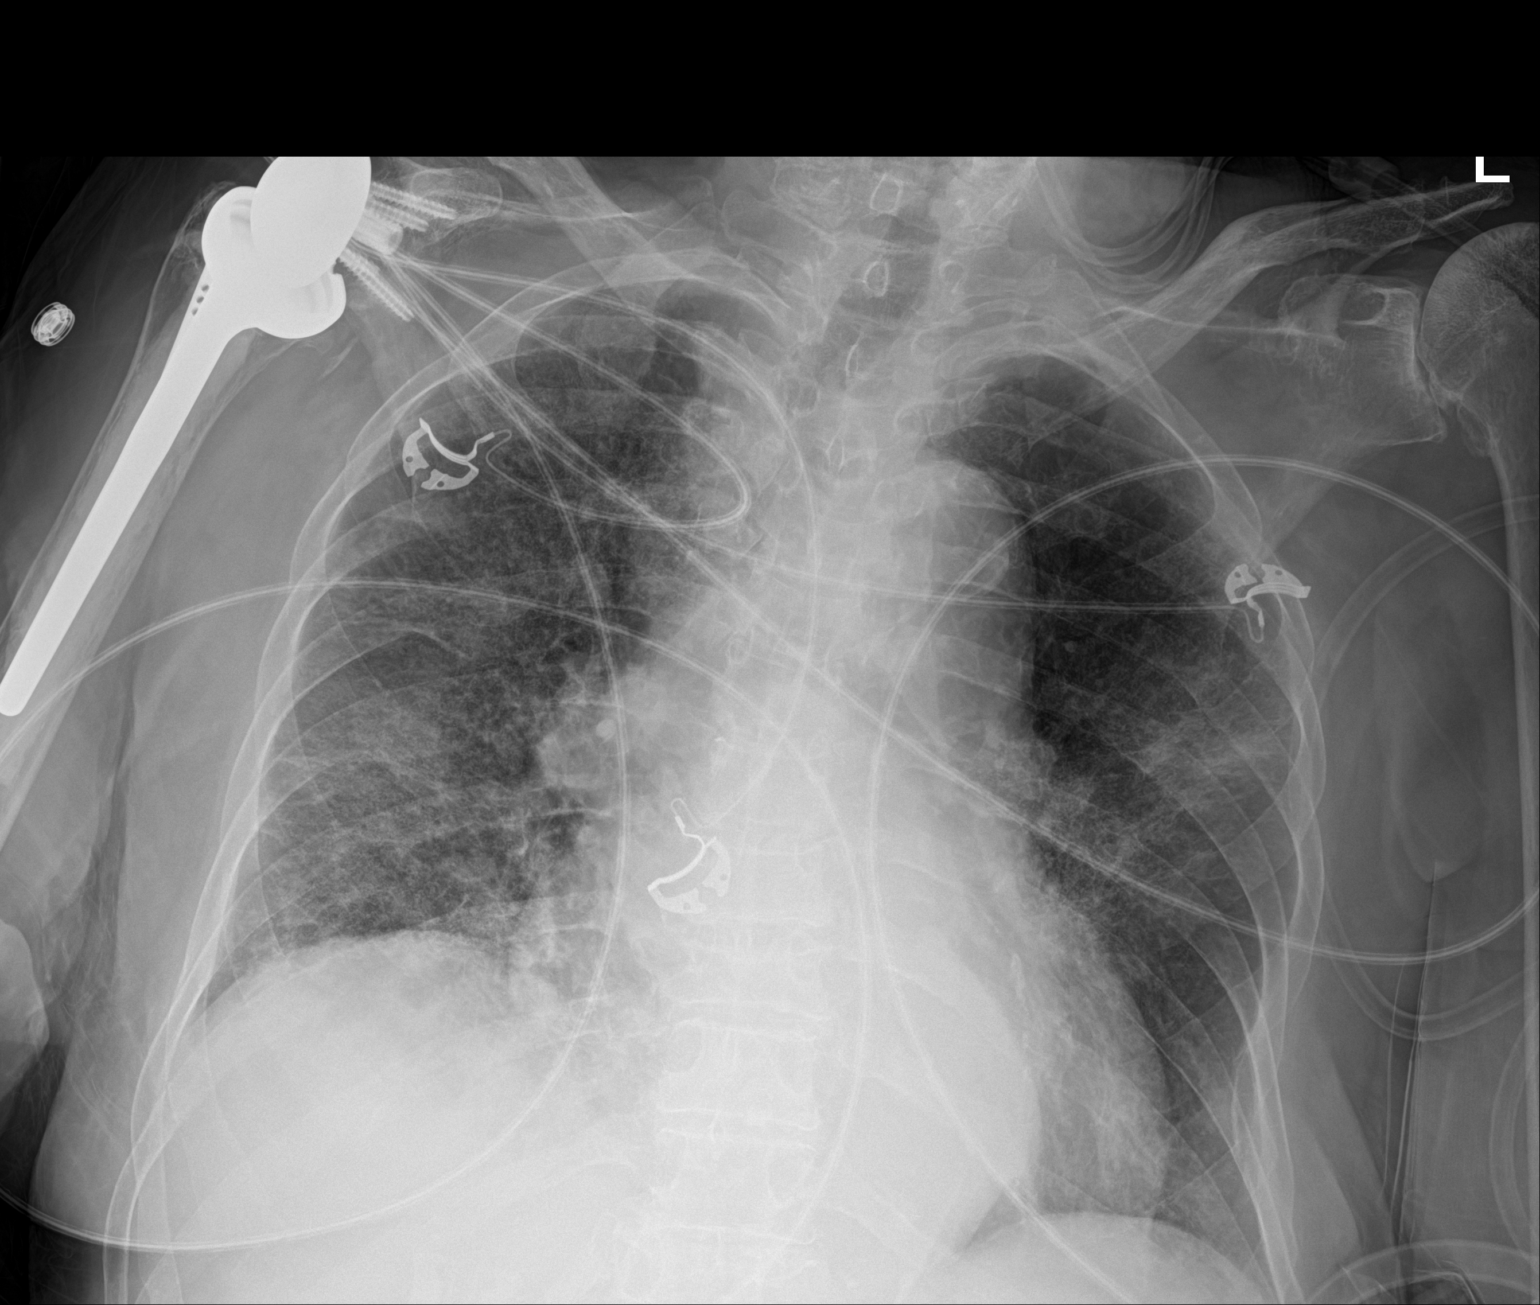

[1 of 1 positions shown; findings below may reference images not displayed]

FINDINGS: Stable cardiomegaly. No pneumothorax or pleural effusion is noted.
Status post right total shoulder arthroplasty. Increased left
midlung opacity is noted with increased right basilar opacity
concerning for worsening multifocal pneumonia.
IMPRESSION: Increased left midlung opacity is noted with increased right basilar
opacity concerning for worsening multifocal pneumonia.

## 2021-11-14 ENCOUNTER — Emergency Department (HOSPITAL_COMMUNITY)
Admission: EM | Admit: 2021-11-14 | Discharge: 2021-11-14 | Disposition: A | Discharge status: Skilled Nursing Facility | Payer: Medicare PPO | Attending: Emergency Medicine | Admitting: Emergency Medicine

## 2021-11-14 ENCOUNTER — Emergency Department (HOSPITAL_COMMUNITY): Payer: Medicare PPO

## 2021-11-14 ENCOUNTER — Other Ambulatory Visit: Payer: Self-pay

## 2021-11-14 DIAGNOSIS — S0990XA Unspecified injury of head, initial encounter: Secondary | ICD-10-CM | POA: Diagnosis present

## 2021-11-14 DIAGNOSIS — Z79899 Other long term (current) drug therapy: Secondary | ICD-10-CM | POA: Diagnosis not present

## 2021-11-14 DIAGNOSIS — Z8616 Personal history of COVID-19: Secondary | ICD-10-CM | POA: Diagnosis not present

## 2021-11-14 DIAGNOSIS — Z23 Encounter for immunization: Secondary | ICD-10-CM | POA: Diagnosis not present

## 2021-11-14 DIAGNOSIS — F0390 Unspecified dementia without behavioral disturbance: Secondary | ICD-10-CM | POA: Insufficient documentation

## 2021-11-14 DIAGNOSIS — S0101XA Laceration without foreign body of scalp, initial encounter: Secondary | ICD-10-CM | POA: Insufficient documentation

## 2021-11-14 DIAGNOSIS — Z7901 Long term (current) use of anticoagulants: Secondary | ICD-10-CM | POA: Diagnosis not present

## 2021-11-14 DIAGNOSIS — I10 Essential (primary) hypertension: Secondary | ICD-10-CM | POA: Diagnosis not present

## 2021-11-14 DIAGNOSIS — W19XXXA Unspecified fall, initial encounter: Secondary | ICD-10-CM | POA: Diagnosis not present

## 2021-11-14 DIAGNOSIS — Z87891 Personal history of nicotine dependence: Secondary | ICD-10-CM | POA: Insufficient documentation

## 2021-11-14 DIAGNOSIS — Z96611 Presence of right artificial shoulder joint: Secondary | ICD-10-CM | POA: Insufficient documentation

## 2021-11-14 LAB — CBC WITH DIFFERENTIAL/PLATELET
Abs Immature Granulocytes: 0.01 10*3/uL (ref 0.00–0.07)
Basophils Absolute: 0.1 10*3/uL (ref 0.0–0.1)
Basophils Relative: 1 %
Eosinophils Absolute: 0.3 10*3/uL (ref 0.0–0.5)
Eosinophils Relative: 4 %
HCT: 48.8 % — ABNORMAL HIGH (ref 36.0–46.0)
Hemoglobin: 15.5 g/dL — ABNORMAL HIGH (ref 12.0–15.0)
Immature Granulocytes: 0 %
Lymphocytes Relative: 16 %
Lymphs Abs: 1.3 10*3/uL (ref 0.7–4.0)
MCH: 29 pg (ref 26.0–34.0)
MCHC: 31.8 g/dL (ref 30.0–36.0)
MCV: 91.2 fL (ref 80.0–100.0)
Monocytes Absolute: 0.9 10*3/uL (ref 0.1–1.0)
Monocytes Relative: 11 %
Neutro Abs: 5.4 10*3/uL (ref 1.7–7.7)
Neutrophils Relative %: 68 %
Platelets: 178 10*3/uL (ref 150–400)
RBC: 5.35 MIL/uL — ABNORMAL HIGH (ref 3.87–5.11)
RDW: 13.7 % (ref 11.5–15.5)
WBC: 7.9 10*3/uL (ref 4.0–10.5)
nRBC: 0 % (ref 0.0–0.2)

## 2021-11-14 LAB — COMPREHENSIVE METABOLIC PANEL
ALT: 15 U/L (ref 0–44)
AST: 30 U/L (ref 15–41)
Albumin: 4.3 g/dL (ref 3.5–5.0)
Alkaline Phosphatase: 73 U/L (ref 38–126)
Anion gap: 8 (ref 5–15)
BUN: 16 mg/dL (ref 8–23)
CO2: 29 mmol/L (ref 22–32)
Calcium: 9.2 mg/dL (ref 8.9–10.3)
Chloride: 101 mmol/L (ref 98–111)
Creatinine, Ser: 0.78 mg/dL (ref 0.44–1.00)
GFR, Estimated: >60 mL/min (ref >60)
Glucose, Bld: 96 mg/dL (ref 70–99)
Potassium: 4 mmol/L (ref 3.5–5.1)
Sodium: 138 mmol/L (ref 135–145)
Total Bilirubin: 0.9 mg/dL (ref 0.3–1.2)
Total Protein: 7.9 g/dL (ref 6.5–8.1)

## 2021-11-14 LAB — CK: Total CK: 127 U/L (ref 38–234)

## 2021-11-14 MED ORDER — TETANUS-DIPHTH-ACELL PERTUSSIS 5-2.5-18.5 LF-MCG/0.5 IM SUSY
0.5000 mL | PREFILLED_SYRINGE | Freq: Once | INTRAMUSCULAR | Status: AC
  Administered 2021-11-14: 0.5 mL via INTRAMUSCULAR
  Filled 2021-11-14: qty 0.5

## 2021-11-14 MED ORDER — LIDOCAINE-EPINEPHRINE 2 %-1:100000 IJ SOLN
INTRAMUSCULAR | Status: AC
  Filled 2021-11-14: qty 1

## 2021-11-14 MED ORDER — BACITRACIN ZINC 500 UNIT/GM EX OINT
TOPICAL_OINTMENT | Freq: Two times a day (BID) | CUTANEOUS | Status: DC
  Filled 2021-11-14: qty 0.9

## 2021-11-14 MED ORDER — LIDOCAINE HCL (PF) 1 % IJ SOLN
INTRAMUSCULAR | Status: AC
  Filled 2021-11-14: qty 30

## 2021-11-14 NOTE — Discharge Instructions (Signed)
This patient was seen and evaluated for an unwitnessed fall and an unknown downtime.  Her work-up today was reassuring including no sign of intracranial abnormality, no sign of new arrhythmia or other heart disorder, or any other lab abnormalities.  She had a laceration on the back of her skull which was repaired with staples.  She will need to have the staples removed in approximately 1 week.  Recommend applying bacitracin to the affected area on the back of the head to prevent infection.  Please monitor for signs of infection.

## 2021-11-14 NOTE — ED Notes (Signed)
SPO2 poor readings. Pulse ox moved to different fingers.  Heat pack on hand to increase circulation. Sats were in 70-80. NRB applied at 15L

## 2021-11-14 NOTE — ED Notes (Signed)
[facility] called, and will pick up at their earliest convenience

## 2021-11-14 NOTE — ED Provider Notes (Addendum)
[facility]-EMERGENCY DEPT Provider Note   CSN: [id number] Arrival date & time: 11/14/21  0906     History Chief Complaint  Patient presents with   Fall   Laceration    3 cm back of head    Shirley [name] is a 87 y.o. female with past medical history significant for dementia, A. fib, history of PE who is questionably anticoagulated on Eliquis who presents from her long-term care facility "[facility]" after having an unwitnessed fall sometime during the night.  Patient was found by EMS on the floor with an abdominal pad under her head to help clean up blood from a laceration of the back of her head.  3 cm laceration noted the back of the head, patient placed in a c-collar.  Patient is alert and oriented only to herself.  At this time I attempted to obtain collateral information from patient's daughter on file, as well as her care facility, with no answer from either party.  With prescription for Eliquis in her file, however EMS report says that patient is not currently anticoagulated.  We will attempt to collateral to further elucidate whether or not patient is taking Eliquis or not.  Patient with O2 sats in the high 70s, low 80s on initial assessment, placed on non-rebreather initially, patient on about 3 L at time my evaluation.  Patient's daughter corroborates with her mental status being equivalent to what we are seeing on evaluation today.  Daughter does not know whether she is on a blood thinner, is going to call another one of her children to check.  Level 5 caveat: Dementia   Fall  Laceration     Past Medical History:  Diagnosis Date   Arrhythmia    Arthritis    "right shoulder; that's why I had to have it replaced"   Hypertension    Osteoarthritis    Osteoporosis    PMR (polymyalgia rheumatica) (HCC)     Patient Active Problem List   Diagnosis Date Noted   Altered mental status    Pressure injury of skin 03/16/2020   Acute lower UTI  03/15/2020   Sepsis (HCC) 03/15/2020   New onset atrial fibrillation (HCC) 12/31/2019   Pneumonia 12/31/2019   Dementia with behavioral disturbance    Pneumonia due to COVID-19 virus 12/03/2019   Hypernatremia    HCAP (healthcare-associated pneumonia) 12/02/2019   Palliative care by specialist    Goals of care, counseling/discussion    DNR (do not resuscitate)    Acute encephalopathy 11/23/2019   Ascending aortic aneurysm 11/23/2019   Moderate dementia 08/10/2015   Frequent falls 05/08/2015   Anxiety 05/08/2015   Anxiety state, unspecified 03/06/2014   Short-term memory loss 12/30/2013   Lumbar degenerative disc disease 08/22/2011   Polymyalgia rheumatica (HCC) 05/16/2008   Essential hypertension 03/12/2008   Arthritis, senescent 03/12/2008   MYALGIA 03/12/2008   OSTEOPOROSIS 03/12/2008    Past Surgical History:  Procedure Laterality Date   CATARACT EXTRACTION W/ INTRAOCULAR LENS  IMPLANT, BILATERAL     2008    HIP PINNING,CANNULATED Right 10/20/2013   Procedure: CANNULATED HIP PINNING;  Surgeon: Eldred Manges, MD;  Location: [facility];  Service: Orthopedics;  Laterality: Right;   KYPHOPLASTY N/A 02/28/2013   Procedure: KYPHOPLASTY;  Surgeon: Cristi Loron, MD;  Location: [facility];  Service: Neurosurgery;  Laterality: N/A;  Lumbar two Kyphoplasty   POSTERIOR FUSION LUMBAR SPINE  03/28/12   SHOULDER ARTHROSCOPY W/ ROTATOR CUFF REPAIR  ~ 01/2011  right   TOTAL SHOULDER REPLACEMENT  06/2011   right   VAGINAL HYSTERECTOMY  1970's     OB History     Gravida  3   Para      Term      Preterm      AB      Living         SAB      IAB      Ectopic      Multiple      Live Births              Family History  Problem Relation Age of Onset   ALS Father    Hypertension Sister     Social History   Tobacco Use   Smoking status: Former    Packs/day: 0.50    Years: 40.00    Pack years: 20.00    Types: Cigarettes    Quit date: 12/05/2013    Years  since quitting: 7.9   Smokeless tobacco: Never  Vaping Use   Vaping Use: Never used  Substance Use Topics   Alcohol use: Never    Alcohol/week: 0.0 standard drinks    Comment: occ   Drug use: No    Home Medications Prior to Admission medications   Medication Sig Start Date End Date Taking? Authorizing Provider  donepezil (ARICEPT) 5 MG tablet Take 5 mg by mouth daily. 10/27/21  Yes [provider]  metoprolol tartrate (LOPRESSOR) 50 MG tablet Take 1 tablet (50 mg total) by mouth 2 (two) times daily. 01/03/20  Yes Alwyn Ren, MD  albuterol (VENTOLIN HFA) 108 (90 Base) MCG/ACT inhaler Inhale 2 puffs into the lungs every 4 (four) hours as needed for wheezing or shortness of breath. 01/03/20   Alwyn Ren, MD  apixaban (ELIQUIS) 2.5 MG TABS tablet Take 2.5 mg by mouth 2 (two) times daily.    [provider]  ascorbic acid (VITAMIN C) 500 MG tablet Take 500 mg by mouth 2 (two) times daily.    [provider]  Cholecalciferol (VITAMIN D-3) 25 MCG (1000 UT) CAPS Take 1,000 Units by mouth daily.    [provider]  citalopram (CELEXA) 10 MG tablet TAKE 2 TABLETS EVERY DAY Patient not taking: Reported on 11/14/2021 04/17/18   Roderick Pee, MD  donepezil (ARICEPT) 10 MG tablet TAKE 1 TABLET EVERY DAY Patient not taking: Reported on 11/14/2021 11/29/17   Van Clines, MD  Multiple Vitamins-Minerals (MULTIVITAMIN WITH MINERALS) tablet Take 1 tablet by mouth daily.    [provider]  neomycin-bacitracin-polymyxin (NEOSPORIN) [phone] ointment Apply 1 application topically in the morning and at bedtime. Apply to lower lip until healed    [provider]  Skin Protectants, Misc. (MINERIN CREME) CREA Apply 1 application topically every 12 (twelve) hours as needed (to affected area(s)- for rashes).     [provider]  triamcinolone cream (KENALOG) 0.1 % Compound 1;1 with Eucerin cream and apply bid prn rash. 04/25/18    Burchette, Elberta Fortis, MD  vitamin B-12 (CYANOCOBALAMIN) 1000 MCG tablet Take 2 tablets (2,000 mcg total) by mouth daily. 11/28/19   Black, Lesle Chris, NP    Allergies    Patient has no known allergies.  Review of Systems   Review of Systems  Reason unable to perform ROS: Level 5 caveat: Dementia.   Physical Exam Updated Vital Signs BP (!) 168/99   Pulse 61   Temp 97.7 F (36.5 C) (Axillary)  Resp 13   SpO2 99%   Physical Exam Vitals and nursing note reviewed.  Constitutional:      General: She is not in acute distress.    Appearance: Normal appearance.  HENT:     Head: Normocephalic.     Comments: Patient with a linear not actively bleeding laceration posterior occipital scalp Eyes:     General:        Right eye: No discharge.        Left eye: No discharge.  Cardiovascular:     Rate and Rhythm: Normal rate and regular rhythm.     Heart sounds: No murmur heard.   No friction rub. No gallop.  Pulmonary:     Effort: Pulmonary effort is normal.     Breath sounds: Normal breath sounds.     Comments: Sounds present in all lung fields, no respiratory distress, no accessory muscle use, no wheezing, rhonchi or rales Abdominal:     General: Bowel sounds are normal.     Palpations: Abdomen is soft.  Skin:    General: Skin is warm and dry.     Capillary Refill: Capillary refill takes less than 2 seconds.  Neurological:     Mental Status: She is alert.     Comments: Alert and oriented only to self.  There is no facial droop noted.  Remainder of neurologic exam is difficult to perform as patient is not responsive to commands.  Psychiatric:        Mood and Affect: Mood normal.        Behavior: Behavior normal.     Comments: Pleasantly demented    ED Results / Procedures / Treatments   Labs (all labs ordered are listed, but only abnormal results are displayed) Labs Reviewed  CBC WITH DIFFERENTIAL/PLATELET - Abnormal; Notable for the following components:      Result Value    RBC 5.35 (*)    Hemoglobin 15.5 (*)    HCT 48.8 (*)    All other components within normal limits  COMPREHENSIVE METABOLIC PANEL  CK    EKG EKG Interpretation  Date/Time:  Sunday November 14 2021 11:10:17 EST Ventricular Rate:  51 PR Interval:  149 QRS Duration: 91 QT Interval:  451 QTC Calculation: 416 R Axis:   17 Text Interpretation: sinus rhythm with premature contractions Borderline T wave abnormalities Confirmed by Pricilla Loveless [id number]) on 11/14/2021 11:33:53 AM  Radiology DG Chest 2 View  Result Date: 11/14/2021 CLINICAL DATA:  Shortness of breath and unwitnessed fall last night. EXAM: CHEST - 2 VIEW COMPARISON:  03/15/2020 FINDINGS: Lungs are hypoinflated without focal airspace consolidation or effusion. Chronic stable hazy interstitial prominence over the lung bases. Cardiomediastinal silhouette is unchanged. Old left posterior rib fractures. Degenerative change of the left shoulder and spine. Partially visualized arthroplasty over the right shoulder. Previous kyphoplasty over the upper lumbar spine. IMPRESSION: Hypoinflation without acute cardiopulmonary disease. Chronic stable interstitial changes within the lung bases. Electronically Signed   By: Elberta Fortis M.D.   On: 11/14/2021 10:51   CT Head Wo Contrast  Result Date: 11/14/2021 CLINICAL DATA:  Head trauma, moderate-severe EXAM: CT HEAD WITHOUT CONTRAST TECHNIQUE: Contiguous axial images were obtained from the base of the skull through the vertex without intravenous contrast. COMPARISON:  12/31/2019 FINDINGS: Brain: There is no acute intracranial hemorrhage, mass effect, or edema. No new loss of gray-white differentiation. Prominence of the ventricles and sulci reflects stable parenchymal volume loss. Patchy and confluent hypoattenuation in the supratentorial white matter is  nonspecific but probably reflects stable chronic microvascular ischemic changes. Relative prominence of the ventricles likely due to central volume  loss. There is no extra-axial fluid collection. Vascular: There is atherosclerotic calcification at the skull base. Skull: Calvarium is unremarkable. Sinuses/Orbits: Lobular paranasal sinus mucosal thickening. Orbits are unremarkable apart from lens replacements. Other: None. IMPRESSION: No evidence of acute intracranial injury. Stable chronic findings detailed above. Electronically Signed   By: Guadlupe Spanish M.D.   On: 11/14/2021 10:40   CT Cervical Spine Wo Contrast  Result Date: 11/14/2021 CLINICAL DATA:  Neck trauma (Age >= 65y); unwitnessed fall EXAM: CT CERVICAL SPINE WITHOUT CONTRAST TECHNIQUE: Multidetector CT imaging of the cervical spine was performed without intravenous contrast. Multiplanar CT image reconstructions were also generated. COMPARISON:  September 2019 FINDINGS: Alignment: Stable. Skull base and vertebrae: Stable cervical vertebral body heights. Chronic T2 compression fracture. No acute fracture. Soft tissues and spinal canal: No prevertebral fluid or swelling. No visible canal hematoma. Disc levels: Multilevel degenerative changes are present similar to the prior study. The spinal canal is widely patent. Multilevel facet degeneration. Upper chest: No apical lung mass. Other: Calcified plaque at the common carotid bifurcations left greater than right. IMPRESSION: No acute cervical spine fracture. Electronically Signed   By: Guadlupe Spanish M.D.   On: 11/14/2021 10:44    Procedures .Marland KitchenLaceration Repair  Date/Time: 11/14/2021 11:41 AM Performed by: Olene Floss, PA-C Authorized by: Olene Floss, PA-C   Consent:    Consent obtained:  Verbal   Consent given by:  Patient and healthcare agent   Risks, benefits, and alternatives were discussed: yes     Risks discussed:  Infection, pain and poor wound healing   Alternatives discussed:  No treatment Universal protocol:    Procedure explained and questions answered to patient or proxy's satisfaction: yes      Patient identity confirmed:  Arm band Anesthesia:    Anesthesia method:  None Laceration details:    Location:  Scalp   Scalp location:  Occipital   Length (cm):  3   Depth (mm):  3 Treatment:    Area cleansed with:  Saline   Amount of cleaning:  Standard Skin repair:    Repair method:  Staples   Number of staples:  3 Approximation:    Approximation:  Close Repair type:    Repair type:  Simple Post-procedure details:    Dressing:  Antibiotic ointment   Procedure completion:  Tolerated   Medications Ordered in ED Medications  bacitracin ointment ( Topical Given 11/14/21 1157)  Tdap (BOOSTRIX) injection 0.5 mL (0.5 mLs Intramuscular Given 11/14/21 1130)  lidocaine-EPINEPHrine (XYLOCAINE W/EPI) 2 %-1:100000 (with pres) injection (  Return to [facility] 11/14/21 1157)    ED Course  I have reviewed the triage vital signs and the nursing notes.  Pertinent labs & imaging results that were available during my care of the patient were reviewed by me and considered in my medical decision making (see chart for details).    MDM Rules/Calculators/A&P                         I discussed this case with my attending physician who cosigned this note including patient's presenting symptoms, physical exam, and planned diagnostics and interventions. Attending physician stated agreement with plan or made changes to plan which were implemented.   Attending physician assessed patient at bedside.  Patient screening work-up is unremarkable including CBC, CMP, CK without abnormality.  CT of the  head and neck without any acute injury, or evidence of intracranial bleed.  Nonischemic, nonarrhythmic EKG other than baseline afib.  Chest x-ray shows stable hypoinflation of the lungs that is similar compared to previous.  Patient continues to be pleasantly demented, not complaining of any pain.  I was unable to contact her long-term care facility after trying 4-5 times.  Was able to contact her daughter who is  unsure whether or not she is on a blood thinner.  Chart reflects that she may be taking Eliquis, but this is not confirmed.  The laceration of the back of her head is not actively bleeding at this time.  Work-up for intracranial bleed does not show any evidence of intracranial bleed.  There is no evidence of abdominal tenderness, distention, or injury to suggest other occult intra-abdominal bleeding.  Patient with intermittent oxygen desaturation throughout her visit, however this was due to poor oxygen saturation readings, poor peripheral vasculature on the angers, ears.  When oxygen saturation was placed on patient's forehead she has been stable at 100% or high 90s, and not requiring any supplemental oxygen.  Continues to have good air movement, stable oxygen saturation on room air at this time.  Patient does have a approximately 3 cm laceration on the back of the scalp that is not actively bleeding.  Laceration repaired as described above.  Patient's family updated, do believe that she is stable for discharge at this time based on her findings.  Encouraged strict monitoring at her facility to prevent further falls. Final Clinical Impression(s) / ED Diagnoses Final diagnoses:  Fall, initial encounter  Laceration of scalp, initial encounter    Rx / DC Orders ED Discharge Orders     None         [place] 11/14/21 1215    Pricilla Loveless, MD 11/14/21 [phone]

## 2021-11-14 NOTE — ED Notes (Signed)
Pulse ox applied to forehead. Taken off NRB. O2 readings in high 90s.

## 2021-11-14 NOTE — ED Triage Notes (Signed)
Patient BIBA from [facility]. Patient had unwitnessed fall some time during the night. Per EMS she was found on the floor with abd pad under her indicated some attempt to clean up the blood, but no attempt to move her.  3 cm lac to back of head. C-collar placed.  Unable to answer orientation questions other than name.  Hx dementia. No blood thinners  BP:166/102 HR:60 RR:18

## 2022-01-05 DEATH — deceased
